# Patient Record
Sex: Male | Born: 1950 | Race: Black or African American | Hispanic: No | Smoking: Former smoker
Health system: Southern US, Community
[De-identification: ages and names within clinical notes are randomized; demographics above are authoritative.]

## PROBLEM LIST (undated history)

## (undated) DIAGNOSIS — R040 Epistaxis: Secondary | ICD-10-CM

## (undated) DIAGNOSIS — I82403 Acute embolism and thrombosis of unspecified deep veins of lower extremity, bilateral: Secondary | ICD-10-CM

## (undated) DIAGNOSIS — J9621 Acute and chronic respiratory failure with hypoxia: Secondary | ICD-10-CM

## (undated) DIAGNOSIS — I10 Essential (primary) hypertension: Secondary | ICD-10-CM

## (undated) DIAGNOSIS — I2699 Other pulmonary embolism without acute cor pulmonale: Secondary | ICD-10-CM

## (undated) DIAGNOSIS — U071 COVID-19: Secondary | ICD-10-CM

## (undated) DIAGNOSIS — S065XAA Traumatic subdural hemorrhage with loss of consciousness status unknown, initial encounter: Secondary | ICD-10-CM

## (undated) DIAGNOSIS — S065X9A Traumatic subdural hemorrhage with loss of consciousness of unspecified duration, initial encounter: Secondary | ICD-10-CM

## (undated) DIAGNOSIS — R760 Raised antibody titer: Secondary | ICD-10-CM

## (undated) DIAGNOSIS — D869 Sarcoidosis, unspecified: Secondary | ICD-10-CM

## (undated) HISTORY — DX: Traumatic subdural hemorrhage with loss of consciousness status unknown, initial encounter: S06.5XAA

## (undated) HISTORY — PX: HERNIA REPAIR: SHX51

## (undated) HISTORY — DX: Raised antibody titer: R76.0

## (undated) HISTORY — DX: Traumatic subdural hemorrhage with loss of consciousness of unspecified duration, initial encounter: S06.5X9A

## (undated) HISTORY — DX: Essential (primary) hypertension: I10

## (undated) HISTORY — PX: BRAIN SURGERY: SHX531

## (undated) HISTORY — DX: Acute embolism and thrombosis of unspecified deep veins of lower extremity, bilateral: I82.403

---
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
## 2011-04-24 DIAGNOSIS — K420 Umbilical hernia with obstruction, without gangrene: Secondary | ICD-10-CM

## 2011-04-24 DIAGNOSIS — I2721 Secondary pulmonary arterial hypertension: Secondary | ICD-10-CM

## 2011-04-24 HISTORY — DX: Secondary pulmonary arterial hypertension: I27.21

## 2011-04-24 HISTORY — DX: Umbilical hernia with obstruction, without gangrene: K42.0

## 2018-03-31 LAB — PULMONARY FUNCTION TEST

## 2018-05-24 DIAGNOSIS — Z95828 Presence of other vascular implants and grafts: Secondary | ICD-10-CM

## 2018-05-24 HISTORY — DX: Presence of other vascular implants and grafts: Z95.828

## 2019-09-07 ENCOUNTER — Ambulatory Visit: Payer: Self-pay | Admitting: Family Medicine

## 2019-09-09 ENCOUNTER — Encounter: Payer: Self-pay | Admitting: Internal Medicine

## 2019-09-09 ENCOUNTER — Ambulatory Visit (INDEPENDENT_AMBULATORY_CARE_PROVIDER_SITE_OTHER): Payer: Medicare Other | Admitting: Internal Medicine

## 2019-09-09 ENCOUNTER — Other Ambulatory Visit: Payer: Self-pay

## 2019-09-09 DIAGNOSIS — Z23 Encounter for immunization: Secondary | ICD-10-CM

## 2019-09-09 DIAGNOSIS — I2699 Other pulmonary embolism without acute cor pulmonale: Secondary | ICD-10-CM

## 2019-09-09 DIAGNOSIS — D869 Sarcoidosis, unspecified: Secondary | ICD-10-CM

## 2019-09-09 DIAGNOSIS — T45515A Adverse effect of anticoagulants, initial encounter: Secondary | ICD-10-CM

## 2019-09-09 DIAGNOSIS — Z7901 Long term (current) use of anticoagulants: Secondary | ICD-10-CM | POA: Diagnosis not present

## 2019-09-09 MED ORDER — PREDNISONE 20 MG PO TABS: 40.0000 mg | ORAL_TABLET | Freq: Every day | ORAL | 0 refills | Status: DC

## 2019-09-09 MED ORDER — FLUTICASONE FUROATE-VILANTEROL 200-25 MCG/INH IN AEPB: 1.0000 | INHALATION_SPRAY | Freq: Every day | RESPIRATORY_TRACT | 2 refills | Status: DC

## 2019-09-09 NOTE — Progress Notes (Signed)
CC: establish care, sarcoidosis  HPI:  JesusJesus Russell is a 68 y.o. male with PMH below.  Today we will address establish care, dyspnea  Please see A&P for status of the patient's chronic medical conditions  Past Medical History:  Diagnosis Date  . DVT of lower extremity, bilateral ([place])   . Hypertension   . Pulmonary embolism ([place])   . Sarcoidosis 1989  . Subdural hematoma (HCC)    Review of Systems:  Review of Systems  Constitutional: Negative for chills and fever.  HENT: Negative for nosebleeds.   Eyes: Negative for blurred vision.  Respiratory: Positive for shortness of breath and wheezing. Negative for cough.   Gastrointestinal: Negative for nausea and vomiting.  Genitourinary: Negative for flank pain.  Musculoskeletal: Negative for falls.  Skin: Negative for rash.  Neurological: Negative for seizures and weakness.  Endo/Heme/Allergies: Does not bruise/bleed easily.  Psychiatric/Behavioral: Negative for depression and suicidal ideas.     Physical Exam:  Vitals:   09/09/19 0849  BP: 134/83  Pulse: 98  SpO2: 93%  Weight: 223 lb 14.4 oz (101.6 kg)   Physical Exam Constitutional:      Appearance: He is not diaphoretic.  Eyes:     General: No scleral icterus.       Right eye: No discharge.        Left eye: No discharge.  Cardiovascular:     Rate and Rhythm: Normal rate and regular rhythm.     Heart sounds: Normal heart sounds. No murmur. No friction rub. No gallop.   Pulmonary:     Effort: No respiratory distress.     Breath sounds: Wheezing present. No rales.  Abdominal:     General: Bowel sounds are normal. There is no distension.     Palpations: Abdomen is soft. There is no mass.     Tenderness: There is no abdominal tenderness. There is no guarding.  Musculoskeletal:        General: No swelling or tenderness.  Skin:    General: Skin is warm and dry.  Neurological:     Mental Status: He is alert.  Psychiatric:        Mood and Affect: Mood normal.         Behavior: Behavior normal.     Social History   Socioeconomic History  . Marital status: Divorced    Spouse name: Not on file  . Number of children: Not on file  . Years of education: Not on file  . Highest education level: Not on file  Occupational History  . Occupation: retired Tour manager  . Financial resource strain: Not very hard  . Food insecurity    Worry: Not on file    Inability: Not on file  . Transportation needs    Medical: Not on file    Non-medical: Not on file  Tobacco Use  . Smoking status: Former Smoker    Packs/day: 1.50    Years: 23.00    Pack years: 34.50    Types: Cigarettes    Quit date: 1995    Years since quitting: 25.7  Substance and Sexual Activity  . Alcohol use: Never    Frequency: Never  . Drug use: Not Currently    Types: "Crack" cocaine    Comment: quit 2008  . Sexual activity: Not on file  Lifestyle  . Physical activity    Days per week: Not on file    Minutes per session: Not on file  . Stress: Not  on file  Relationships  . Social Herbalist on phone: Not on file    Gets together: Not on file    Attends religious service: Not on file    Active member of club or organization: Not on file    Attends meetings of clubs or organizations: Not on file    Relationship status: Not on file  . Intimate partner violence    Fear of current or ex partner: Not on file    Emotionally abused: Not on file    Physically abused: Not on file    Forced sexual activity: Not on file  Other Topics Concern  . Not on file  Social History Narrative  . Not on file    Family History  Adopted: Yes    Assessment & Plan:   See Encounters Tab for problem based charting.  Patient discussed with Dr. Lynnae January

## 2019-09-09 NOTE — Assessment & Plan Note (Signed)
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  We are waiting on records to get the exact number and timeline.  He gets his Coumadin checked about once per month is been on a stable dosage.  -We will arrange for him to have an appointment in our Coumadin clinic for monitoring and dosage adjustments if needed

## 2019-09-09 NOTE — Assessment & Plan Note (Signed)
Diagnosed in 1989, treatment was deferred initially.  He has been maintained on inhaled corticosteroid therapy as he does have some wheezing and shortness of breath occasionally.  Today he is having some wheezing and shortness of breath.  He is also been tested for asthma, COPD with many different pulmonary function tests last of which was about 6 months ago.  Although we do not have his records yet he says that so far they have all been inconclusive not suggestive of COPD or asthma  -We will work to get a waiver signed today so we can get his records -He is clearly having difficulty breathing today with some wheezing and I will write for a short steroid burst -I will also switch his inhaler therapy from ICS and albuterol as needed to ICS/LABA with Breo and he can continue to use his rescue inhaler as needed

## 2019-09-09 NOTE — Patient Instructions (Signed)
Mr. Amorin I have sent you a prescription in for a short course of oral steroids.  I have also started you on a new inhaler.  You can stop the flovent and use your new inhaler daily.  You can still use your rescue albuterol as needed.  Please schedule an appointment with our coumadin clinic to manage your warfarin.

## 2019-09-14 ENCOUNTER — Other Ambulatory Visit: Payer: Self-pay

## 2019-09-14 ENCOUNTER — Ambulatory Visit (INDEPENDENT_AMBULATORY_CARE_PROVIDER_SITE_OTHER): Payer: Medicare Other | Admitting: Pharmacist

## 2019-09-14 DIAGNOSIS — Z7901 Long term (current) use of anticoagulants: Secondary | ICD-10-CM

## 2019-09-14 DIAGNOSIS — I2699 Other pulmonary embolism without acute cor pulmonale: Secondary | ICD-10-CM

## 2019-09-14 DIAGNOSIS — Z86718 Personal history of other venous thrombosis and embolism: Secondary | ICD-10-CM | POA: Diagnosis not present

## 2019-09-14 DIAGNOSIS — Z86711 Personal history of pulmonary embolism: Secondary | ICD-10-CM

## 2019-09-14 DIAGNOSIS — Z5181 Encounter for therapeutic drug level monitoring: Secondary | ICD-10-CM

## 2019-09-14 LAB — POCT INR: INR: 2.3 (ref 2.0–3.0)

## 2019-09-14 NOTE — Progress Notes (Signed)
Anticoagulation Management Jesus Russell is a 68 y.o. male who reports to the clinic for monitoring of warfarin treatment.    Indication: PE, history of (resolved); DVT, history of multiple recurrences (resolved); Long term current use of anticoagulant.   Duration: indefinite Supervising physician: Velna Ochs, MD  Anticoagulation Clinic Visit History: Patient does not report signs/symptoms of bleeding or thromboembolism  Other recent changes: No diet, medications, lifestyle changes endorsed.  Anticoagulation Episode Summary    Current INR goal:  2.0-3.0  TTR:  -  Next INR check:  10/05/2019  INR from last check:  2.3 (09/14/2019)  Weekly max warfarin dose:    Target end date:  Indefinite  INR check location:    Preferred lab:    Send INR reminders to:     Indications   Long term (current) use of anticoagulants [Z79.01] History of DVT (deep vein thrombosis) [Z86.718] Pulmonary embolism on long-term anticoagulation therapy (HCC) [I26.99 Z79.01] History of pulmonary embolism [Z86.711]       Comments:          Not on File  Current Outpatient Medications:  .  cholecalciferol (VITAMIN D3) 25 MCG (1000 UT) tablet, Take 1,000 Units by mouth daily., Disp: , Rfl:  .  fluticasone furoate-vilanterol (BREO ELLIPTA) 200-25 MCG/INH AEPB, Inhale 1 puff into the lungs daily., Disp: 60 each, Rfl: 2 .  losartan (COZAAR) 100 MG tablet, Take 12.5 mg by mouth daily., Disp: , Rfl:  .  zinc gluconate 50 MG tablet, Take 50 mg by mouth daily., Disp: , Rfl:  .  predniSONE (DELTASONE) 20 MG tablet, Take 2 tablets (40 mg total) by mouth daily with breakfast. (Patient not taking: Reported on 09/14/2019), Disp: 4 tablet, Rfl: 0 Past Medical History:  Diagnosis Date  . DVT of lower extremity, bilateral ([place])   . Hypertension   . Pulmonary embolism ([place])   . Sarcoidosis 1989  . Subdural hematoma (H[facility])    Social History   Socioeconomic History  . Marital status: Divorced    Spouse name: Not  on file  . Number of children: Not on file  . Years of education: Not on file  . Highest education level: Not on file  Occupational History  . Occupation: retired Tour manager  . Financial resource strain: Not very hard  . Food insecurity    Worry: Not on file    Inability: Not on file  . Transportation needs    Medical: Not on file    Non-medical: Not on file  Tobacco Use  . Smoking status: Former Smoker    Packs/day: 1.50    Years: 23.00    Pack years: 34.50    Types: Cigarettes    Quit date: 1995    Years since quitting: 25.7  . Smokeless tobacco: Never Used  Substance and Sexual Activity  . Alcohol use: Never    Frequency: Never  . Drug use: Not Currently    Types: "Crack" cocaine    Comment: quit 2008  . Sexual activity: Not on file  Lifestyle  . Physical activity    Days per week: Not on file    Minutes per session: Not on file  . Stress: Not on file  Relationships  . Social Herbalist on phone: Not on file    Gets together: Not on file    Attends religious service: Not on file    Active member of club or organization: Not on file    Attends meetings of clubs  or organizations: Not on file    Relationship status: Not on file  Other Topics Concern  . Not on file  Social History Narrative  . Not on file   Family History  Adopted: Yes    ASSESSMENT Recent Results: The most recent result is correlated with 28 mg per week: Lab Results  Component Value Date   INR 2.3 09/14/2019    Anticoagulation Dosing: Description   Take one of your 82m-blue warfarin tablets, by mouth, once-daily [facility].      INR today: Therapeutic  PLAN Weekly dose was unchanged.   Patient Instructions  Patient instructed to take medications as defined in the Anti-coagulation Track section of this encounter.  Patient instructed to take today's dose.  Patient instructed to take  one of your 467mblue warfarin tablets, by mouth, once-daily [facility]  [facility].  Patient verbalized understanding of these instructions.     Patient advised to contact clinic or seek medical attention if signs/symptoms of bleeding or thromboembolism occur.  Patient verbalized understanding by repeating back information and was advised to contact me if further medication-related questions arise. Patient was also provided an information handout.  Follow-up Return in 22 days (on 10/06/2019) for Follow up INR.  JaPennie BanterPharmD, CPP  15 minutes spent face-to-face with the patient during the encounter. 50% of time spent on education, including signs/sx bleeding and clotting, as well as food and drug interactions with warfarin. 50% of time was spent on fingerprick POC INR sample collection,processing, results determination, and documentation in [email or web address]

## 2019-09-14 NOTE — Patient Instructions (Signed)
Patient instructed to take medications as defined in the Anti-coagulation Track section of this encounter.  Patient instructed to take today's dose.  Patient instructed to take one of your 4mg-blue warfarin tablets, by mouth, once-daily at 6PM each day.  Patient verbalized understanding of these instructions.   

## 2019-09-14 NOTE — Progress Notes (Signed)
History of intracranial hemorrhage December 2017   He takes one 4 mg tablet daily Target 2-3  Nose bleed 8

## 2019-09-14 NOTE — Progress Notes (Signed)
Internal Medicine Clinic Attending  Case discussed with Dr. Winfrey  at the time of the visit.  We reviewed the resident's history and exam and pertinent patient test results.  I agree with the assessment, diagnosis, and plan of care documented in the resident's note.  

## 2019-09-17 NOTE — Progress Notes (Signed)
I agree with pharmacy recommendations as outlined in their note.

## 2019-10-06 ENCOUNTER — Other Ambulatory Visit: Payer: Self-pay

## 2019-10-06 ENCOUNTER — Ambulatory Visit (HOSPITAL_COMMUNITY)
Admission: RE | Admit: 2019-10-06 | Discharge: 2019-10-06 | Disposition: A | Discharge status: Home / Self Care | Payer: Medicare Other | Source: Ambulatory Visit | Attending: Internal Medicine | Admitting: Internal Medicine

## 2019-10-06 ENCOUNTER — Ambulatory Visit (INDEPENDENT_AMBULATORY_CARE_PROVIDER_SITE_OTHER): Payer: Medicare Other | Admitting: Pharmacist

## 2019-10-06 ENCOUNTER — Ambulatory Visit (INDEPENDENT_AMBULATORY_CARE_PROVIDER_SITE_OTHER): Payer: Medicare Other | Admitting: Internal Medicine

## 2019-10-06 VITALS — BP 136/85 | HR 87 | Temp 98.0°F | Wt 224.9 lb

## 2019-10-06 DIAGNOSIS — Z87891 Personal history of nicotine dependence: Secondary | ICD-10-CM

## 2019-10-06 DIAGNOSIS — I10 Essential (primary) hypertension: Secondary | ICD-10-CM | POA: Insufficient documentation

## 2019-10-06 DIAGNOSIS — D860 Sarcoidosis of lung: Secondary | ICD-10-CM | POA: Insufficient documentation

## 2019-10-06 DIAGNOSIS — Z86711 Personal history of pulmonary embolism: Secondary | ICD-10-CM

## 2019-10-06 DIAGNOSIS — Z7901 Long term (current) use of anticoagulants: Secondary | ICD-10-CM | POA: Diagnosis not present

## 2019-10-06 DIAGNOSIS — Z5181 Encounter for therapeutic drug level monitoring: Secondary | ICD-10-CM | POA: Diagnosis not present

## 2019-10-06 DIAGNOSIS — Z79899 Other long term (current) drug therapy: Secondary | ICD-10-CM | POA: Diagnosis not present

## 2019-10-06 DIAGNOSIS — Z86718 Personal history of other venous thrombosis and embolism: Secondary | ICD-10-CM

## 2019-10-06 DIAGNOSIS — Z452 Encounter for adjustment and management of vascular access device: Secondary | ICD-10-CM | POA: Insufficient documentation

## 2019-10-06 DIAGNOSIS — Z7951 Long term (current) use of inhaled steroids: Secondary | ICD-10-CM

## 2019-10-06 DIAGNOSIS — Z1211 Encounter for screening for malignant neoplasm of colon: Secondary | ICD-10-CM | POA: Insufficient documentation

## 2019-10-06 DIAGNOSIS — I2699 Other pulmonary embolism without acute cor pulmonale: Secondary | ICD-10-CM

## 2019-10-06 DIAGNOSIS — D869 Sarcoidosis, unspecified: Secondary | ICD-10-CM

## 2019-10-06 LAB — POCT INR: INR: 2.3 (ref 2.0–3.0)

## 2019-10-06 MED ORDER — PREDNISONE 20 MG PO TABS: 40.0000 mg | ORAL_TABLET | Freq: Every day | ORAL | 0 refills | Status: DC

## 2019-10-06 NOTE — Progress Notes (Signed)
Anticoagulation Management Jesus Russell is a 68 y.o. male who reports to the clinic for monitoring of warfarin treatment.    Indication: History of DVT (resolved); History of PE (resolved); Long term current use of anticoagulant.    Duration: indefinite Supervising physician: Lalla Brothers  Anticoagulation Clinic Visit History: Patient does not report signs/symptoms of bleeding or thromboembolism  Other recent changes: No diet, medications, lifestyle changes endorsed by the patient at this visit.  Anticoagulation Episode Summary    Current INR goal:  2.0-3.0  TTR:  100.0 % (1.7 wk)  Next INR check:  11/02/2019  INR from last check:  2.3 (10/06/2019)  Weekly max warfarin dose:    Target end date:  Indefinite  INR check location:    Preferred lab:    Send INR reminders to:     Indications   Long term (current) use of anticoagulants [Z79.01] History of DVT (deep vein thrombosis) [Z86.718] Pulmonary embolism on long-term anticoagulation therapy (HCC) [I26.99 T45.515A] History of pulmonary embolism [Z86.711]       Comments:          Not on File  Current Outpatient Medications:  .  cholecalciferol (VITAMIN D3) 25 MCG (1000 UT) tablet, Take 1,000 Units by mouth daily., Disp: , Rfl:  .  fluticasone furoate-vilanterol (BREO ELLIPTA) 200-25 MCG/INH AEPB, Inhale 1 puff into the lungs daily., Disp: 60 each, Rfl: 2 .  losartan-hydrochlorothiazide (HYZAAR) 50-12.5 MG tablet, Take 1 tablet by mouth daily., Disp: , Rfl:  .  predniSONE (DELTASONE) 20 MG tablet, Take 2 tablets (40 mg total) by mouth daily with breakfast., Disp: 120 tablet, Rfl: 0 .  zinc gluconate 50 MG tablet, Take 50 mg by mouth daily., Disp: , Rfl:  Past Medical History:  Diagnosis Date  . DVT of lower extremity, bilateral ([place])   . Hypertension   . Pulmonary embolism ([place])   . Sarcoidosis 1989  . Subdural hematoma (H[facility])    Social History   Socioeconomic History  . Marital status: Divorced    Spouse name: Not  on file  . Number of children: Not on file  . Years of education: Not on file  . Highest education level: Not on file  Occupational History  . Occupation: retired Tour manager  . Financial resource strain: Not very hard  . Food insecurity    Worry: Not on file    Inability: Not on file  . Transportation needs    Medical: Not on file    Non-medical: Not on file  Tobacco Use  . Smoking status: Former Smoker    Packs/day: 1.50    Years: 23.00    Pack years: 34.50    Types: Cigarettes    Quit date: 1995    Years since quitting: 25.8  . Smokeless tobacco: Never Used  Substance and Sexual Activity  . Alcohol use: Never    Frequency: Never  . Drug use: Not Currently    Types: "Crack" cocaine    Comment: quit 2008  . Sexual activity: Not on file  Lifestyle  . Physical activity    Days per week: Not on file    Minutes per session: Not on file  . Stress: Not on file  Relationships  . Social Herbalist on phone: Not on file    Gets together: Not on file    Attends religious service: Not on file    Active member of club or organization: Not on file    Attends meetings of  clubs or organizations: Not on file    Relationship status: Not on file  Other Topics Concern  . Not on file  Social History Narrative  . Not on file   Family History  Adopted: Yes    ASSESSMENT Recent Results: The most recent result is correlated with 28 mg per week: Lab Results  Component Value Date   INR 2.3 10/06/2019   INR 2.3 09/14/2019    Anticoagulation Dosing: Description   Take one of your 30m-blue warfarin tablets, by mouth, once-daily [facility].      INR today: Therapeutic  PLAN Weekly dose was unchanged, he will continue taking 433mday (2854mk).  Patient Instructions  Patient instructed to take medications as defined in the Anti-coagulation Track section of this encounter.  Patient instructed to take today's dose.  Patient instructed to take one of  your 4mg67mue warfarin tablets, by mouth, once-daily at [facility].  Patient verbalized understanding of these instructions.    Patient advised to contact clinic or seek medical attention if signs/symptoms of bleeding or thromboembolism occur.  Patient verbalized understanding by repeating back information and was advised to contact me if further medication-related questions arise. Patient was also provided an information handout.  Follow-up Return in 27 days (on 11/02/2019) for Follow up INR.  JamePennie BanterarmD, CPP  15 minutes spent face-to-face with the patient during the encounter. 50% of time spent on education, including signs/sx bleeding and clotting, as well as food and drug interactions with warfarin. 50% of time was spent on fingerprick POC INR sample collection,processing, results determination, and documentation in [email or web address]

## 2019-10-06 NOTE — Assessment & Plan Note (Signed)
Blood pressure of 136/83 today, 134/83 at last appointment.  Patient reports taking losartan-hydrochlorothiazide 50-12.5 mg every other day as it makes him urinate too much if he takes it more frequently.  Informed patient that he would likely benefit from taking it every day especially in the context of initiating prednisone therapy and suggested that we could try alternative medications if he is not okay with the side effects.  Patient states he desires to continue with current medication will take it daily.

## 2019-10-06 NOTE — Progress Notes (Signed)
Internal Medicine Clinic Attending  I saw and evaluated the patient.  I personally confirmed the key portions of the history and exam documented by Dr. Benjamine Mola and I reviewed pertinent patient test results.  The assessment, diagnosis, and plan were formulated together and I agree with the documentation in the resident's note.  Lenice Pressman, M.D., Ph.D.

## 2019-10-06 NOTE — Patient Instructions (Signed)
At your visit today we discussed your symptoms from your sarcoidosis.  You previously had benefit from a short course of prednisone.  We are giving you a prolonged course of prednisone and have sent a referral for you to follow-up with pulmonology.  We also want to see you in our clinic in approximately 6 weeks.  We do not want to abruptly discontinue your prednisone but will need to taper it slowly.  At your follow-up appointment, we can discuss dosing and managing this taper.  We have also placed an order to get a chest x-ray to see if there is any other abnormalities.  Given your elevated blood pressure at clinic today, and the new prednisone medication which can increase your blood pressure, it is important to take your losartan-hydrochlorothiazide every day.  If you are urinating too frequently and this is bothering you we can discuss alternative medications.  We have also sent a referral to the gastroenterologist so that you can have a colonoscopy performed.

## 2019-10-06 NOTE — Patient Instructions (Signed)
Patient instructed to take medications as defined in the Anti-coagulation Track section of this encounter.  Patient instructed to take today's dose.  Patient instructed to take one of your 26m-blue warfarin tablets, by mouth, [facility]-daily [facility].  Patient verbalized understanding of these instructions.

## 2019-10-06 NOTE — Progress Notes (Signed)
   CC: Shortness of breath  HPI:  [name] is a 68 y.o. with past medical history as below who presents for follow-up on shortness of breath.  Please see problem based documentation below.  Past Medical History:  Diagnosis Date  . DVT of lower extremity, bilateral ([place])   . Hypertension   . Pulmonary embolism ([place])   . Sarcoidosis 1989  . Subdural hematoma (H[facility])    Review of Systems:   Review of Systems  Constitutional: Negative for chills and fever.  HENT: Negative for congestion.   Respiratory: Positive for cough and shortness of breath.   Cardiovascular: Negative for chest pain.  Gastrointestinal: Negative for abdominal pain, constipation, diarrhea, nausea and vomiting.  Genitourinary: Negative for dysuria, frequency and urgency.  All other systems reviewed and are negative.   Physical Exam: Physical Exam  Constitutional: He is well-developed, well-nourished, and in no distress.  Pauses frequently during sentences due to shortness of breath  HENT:  Head: Normocephalic and atraumatic.  Eyes: EOM are normal. Right eye exhibits no discharge. Left eye exhibits no discharge.  Neck: Normal range of motion. No tracheal deviation present.  Cardiovascular: Normal rate and regular rhythm. Exam reveals no gallop and no friction rub.  No murmur heard. Pulmonary/Chest: Effort normal. No respiratory distress. He has wheezes. He has no rales.  Abdominal: Soft. He exhibits no distension. There is no abdominal tenderness. There is no rebound and no guarding.  Musculoskeletal: Normal range of motion.        General: No tenderness, deformity or edema.  Neurological: He is alert. Coordination normal.  Skin: Skin is warm and dry. No rash noted. He is not diaphoretic. No erythema.  Psychiatric: Memory and judgment normal.    Vitals:   10/06/19 1430  BP: 136/85  Pulse: 87  Temp: 98 F (36.7 C)  TempSrc: Oral  SpO2: 92%  Weight: 224 lb 14.4 oz (102 kg)    Assessment & Plan:    See Encounters Tab for problem based charting.  Patient seen and discussed with Dr. Rebeca Alert

## 2019-10-06 NOTE — Assessment & Plan Note (Signed)
On long-term warfarin therapy for PE/DVT history.  Appointment today with Dr. Johney Maine

## 2019-10-06 NOTE — Assessment & Plan Note (Addendum)
Patient reports that he was diagnosed with sarcoidosis in 1990.  Patient had a routine preoperative chest x-ray which detected abnormalities and was followed by bronchoscopy to confirm diagnosis of sarcoidosis.  Patient was later trialed on prednisone which he took for approximately 1 year and discontinued because of weight gain.  Patient was given 2 days of prednisone at last clinic visit and had temporary but significant improvement and is inquiring about additional prednisone therapy.  Patient also reports that the Breo-Ellipta improved his symptoms.  He states he has not taken the albuterol inhaler as it does not help.  We are still awaiting records from prior providers.  Given history, significant wheezing on exam, and reported improvement with prednisone trial, will initiate longer-term prednisone therapy.  We will plan for 6-week duration of therapy and at follow-up appointment will assess response and manage taper.  We will also send referral for pulmonology.  We will continue with Breo-Ellipta therapy as well. Ordering CXR to evaluate for additional process.

## 2019-10-06 NOTE — Assessment & Plan Note (Signed)
Patient states that he is never had a colonoscopy, but is open to getting one.  Referral sent to gastroenterology for colonoscopy.

## 2019-10-07 ENCOUNTER — Encounter: Payer: Self-pay | Admitting: Gastroenterology

## 2019-10-07 NOTE — Progress Notes (Signed)
INTERNAL MEDICINE TEACHING ATTENDING ADDENDUM ° °I agree with pharmacy recommendations as outlined in their note.  ° °-Meggen Spaziani MD ° °

## 2019-10-27 ENCOUNTER — Encounter: Payer: Self-pay | Admitting: Internal Medicine

## 2019-10-29 ENCOUNTER — Encounter: Payer: Self-pay | Admitting: Internal Medicine

## 2019-10-29 ENCOUNTER — Other Ambulatory Visit: Payer: Self-pay | Admitting: Internal Medicine

## 2019-10-29 DIAGNOSIS — Z86718 Personal history of other venous thrombosis and embolism: Secondary | ICD-10-CM

## 2019-10-29 DIAGNOSIS — Z7901 Long term (current) use of anticoagulants: Secondary | ICD-10-CM

## 2019-10-29 DIAGNOSIS — Z23 Encounter for immunization: Secondary | ICD-10-CM | POA: Insufficient documentation

## 2019-10-29 DIAGNOSIS — K760 Fatty (change of) liver, not elsewhere classified: Secondary | ICD-10-CM | POA: Insufficient documentation

## 2019-10-29 DIAGNOSIS — I2721 Secondary pulmonary arterial hypertension: Secondary | ICD-10-CM | POA: Insufficient documentation

## 2019-10-29 DIAGNOSIS — G4733 Obstructive sleep apnea (adult) (pediatric): Secondary | ICD-10-CM

## 2019-10-29 DIAGNOSIS — D869 Sarcoidosis, unspecified: Secondary | ICD-10-CM

## 2019-10-29 DIAGNOSIS — K8020 Calculus of gallbladder without cholecystitis without obstruction: Secondary | ICD-10-CM | POA: Insufficient documentation

## 2019-10-29 NOTE — Assessment & Plan Note (Signed)
Obstructive sleep apnea noted at [facility] on 07/03/2018. Note states patient is non-compliant with CPAP

## 2019-10-29 NOTE — Assessment & Plan Note (Addendum)
From [facility]: US Duplex Venous lower extremity right (04/02/2017): Demonstrates extensive right lower extremity deep vein thrombosis

## 2019-10-29 NOTE — Assessment & Plan Note (Addendum)
Records received from [facility]:  Complete 2D M-mode Doppler echocardiogram: Conclusions: *The left ventricle size is normal *There is mild concentric left ventricular hypertrophy *There is normal global left ventricular contractility *The diastolic filling pattern indicates impaired relaxation *There is mild aortic valve sclerosis *Mild mitral regurgitation is present *Partial torn cord of anterior leaflet no prolapse of significant MR no masses * EF (Teich): 58%

## 2019-10-29 NOTE — Assessment & Plan Note (Addendum)
Records from [facility]: CT Chest Without IV Contrast (05/27/2018): Impression: 1. Precarinal and hilar adenopathy, consistent with the patient's clinical diagnosis of sarcoidosis. 2.  Severe bilateral parenchymal changes including areas of bronchiectasis and fibrosis, consistent with parenchymal involvement of sarcoidosis. 3.  Enlarged pulmonary artery branches, suggesting pulmonary artery hypertension. 4.  Cardiomegaly. 5.  Coronary artery atherosclerosis. 6.  No CT evidence of suspicious pulmonary nodule, mass or acute lobar consolidation. 7.  Porcelain gallbladder. 8.  Vena cava filter.  PFTs (04/26/2016): Findings (summarized): * Moderately reduced FVC 2.15 L, 47% of predicted.  After ministration of albuterol, the FVC is 1.97 L, 43% predicted * Reduced FEV1/FVC of 66%. * Reduced FEV1 1.43 L, 45% predicted. Impression: There is moderate combined obstructive/restrictive ventilatory defect.  There is no clear improvement with administration of a bronchodilator.  There is relative residual hyperinflation.  The reduced ERV is likely due to body habitus.  The diffusing capacity is mildly reduced, but normalizes after correcting for alveolar volume.  PFTs (04/02/2018): Findings (summarized): *FVC is moderately reduced, 2.01 L, 49% predicted *FEV1 moderately reduced, 1.41 L, 49% predicted *FEV1/FVC is normal, 70% *After bronchodilator the FVC is 58% predicted, 19% increase, FEV1 is 51% predicted, FEV1/FVC mildly reduced, 61%.  Interpretation: There is a moderate restrictive ventilatory defect prior to bronchodilator.  There is significant improvement with administration of bronchodilator for FVC but after administration of a bronchodilator there is an obstructive ventilatory defect.  Lung volumes show moderate restrictive ventilatory defect.  When compared to prior study, there is no significant trend.

## 2019-10-29 NOTE — Assessment & Plan Note (Addendum)
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  He will require lifelong warfarin therapy.

## 2019-10-29 NOTE — Progress Notes (Unsigned)
Records received and reviewed from [facility], [facility]. Additions made to problem list and medical history with PowerScribe. Relevant records will be scanned into medical record.

## 2019-10-29 NOTE — Assessment & Plan Note (Signed)
Noted on abdominal ultrasound from February 2017 from [facility]

## 2019-10-29 NOTE — Assessment & Plan Note (Signed)
Note from [facility] indicates he received Prevnar-13 on 06/23/2019

## 2019-11-02 ENCOUNTER — Ambulatory Visit (INDEPENDENT_AMBULATORY_CARE_PROVIDER_SITE_OTHER): Payer: Medicare Other | Admitting: Pharmacist

## 2019-11-02 DIAGNOSIS — Z5181 Encounter for therapeutic drug level monitoring: Secondary | ICD-10-CM

## 2019-11-02 DIAGNOSIS — Z86718 Personal history of other venous thrombosis and embolism: Secondary | ICD-10-CM

## 2019-11-02 DIAGNOSIS — Z7901 Long term (current) use of anticoagulants: Secondary | ICD-10-CM | POA: Diagnosis present

## 2019-11-02 DIAGNOSIS — I2699 Other pulmonary embolism without acute cor pulmonale: Secondary | ICD-10-CM

## 2019-11-02 DIAGNOSIS — Z86711 Personal history of pulmonary embolism: Secondary | ICD-10-CM | POA: Diagnosis not present

## 2019-11-02 LAB — POCT INR: INR: 3 (ref 2.0–3.0)

## 2019-11-02 MED ORDER — WARFARIN SODIUM 4 MG PO TABS: ORAL_TABLET | ORAL | 2 refills | Status: DC

## 2019-11-02 NOTE — Addendum Note (Signed)
Addended by: Jorene Guest B on: 11/02/2019 02:36 PM   Modules accepted: Orders

## 2019-11-02 NOTE — Patient Instructions (Signed)
Patient instructed to take medications as defined in the Anti-coagulation Track section of this encounter.  Patient instructed to take today's dose.  Patient instructed to take one of your 55m-blue warfarin tablets, by mouth, once-daily at [facility] day--EXCEPT on Mondays, Wednesdays, and Fridays--take ONLY 1/2 tablet.  Patient verbalized understanding of these instructions.

## 2019-11-02 NOTE — Progress Notes (Signed)
Anticoagulation Management Jesus Russell is a 68 y.o. male who reports to the clinic for monitoring of warfarin treatment.    Indication: PE C, History of; Long term current use of anticoagulant.   Duration: indefinite Supervising physician: Velna Ochs, MD  Anticoagulation Clinic Visit History: Patient does not report signs/symptoms of bleeding or thromboembolism  Other recent changes: No diet, medications, lifestyle changes.  Anticoagulation Episode Summary    Current INR goal:  2.0-3.0  TTR:  100.0 % (1.3 mo)  Next INR check:  11/23/2019  INR from last check:  3.0 (11/02/2019)  Weekly max warfarin dose:    Target end date:  Indefinite  INR check location:    Preferred lab:    Send INR reminders to:     Indications   Long term (current) use of anticoagulants [Z79.01] History of DVT (deep vein thrombosis) [Z86.718] Pulmonary embolism on long-term anticoagulation therapy (HCC) [I26.99 T45.515A] History of pulmonary embolism [Z86.711]       Comments:          Not on File  Current Outpatient Medications:  .  cholecalciferol (VITAMIN D3) 25 MCG (1000 UT) tablet, Take 1,000 Units by mouth daily., Disp: , Rfl:  .  fluticasone furoate-vilanterol (BREO ELLIPTA) 200-25 MCG/INH AEPB, Inhale 1 puff into the lungs daily., Disp: 60 each, Rfl: 2 .  losartan-hydrochlorothiazide (HYZAAR) 50-12.5 MG tablet, Take 1 tablet by mouth daily., Disp: , Rfl:  .  predniSONE (DELTASONE) 20 MG tablet, Take 2 tablets (40 mg total) by mouth daily with breakfast., Disp: 120 tablet, Rfl: 0 .  warfarin (COUMADIN) 4 MG tablet, Take 4 mg by mouth daily., Disp: , Rfl:  .  zinc gluconate 50 MG tablet, Take 50 mg by mouth daily., Disp: , Rfl:  Past Medical History:  Diagnosis Date  . DVT of lower extremity, bilateral ([place])   . Hypertension   . Incarcerated umbilical hernia 17/5102   Status post repair  . Lupus anticoagulant positive    Per records from [facility]  . 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 Pulmonary artery hypertension ([place]) 04/2011   Mildly elevated pulmonary artery pressure in setting of PE (from [facility])  . Pulmonary embolism ([place]) 04/2011  . Sarcoidosis 1989  . Subdural hematoma (HCC)    Remote episode, occurred when taking excedrin and warfarin.    Social History   Socioeconomic History  . Marital status: Divorced    Spouse name: Not on file  . Number of children: Not on file  . Years of education: Not on file  . Highest education level: Not on file  Occupational History  . Occupation: retired Tour manager  . Financial resource strain: Not very hard  . Food insecurity    Worry: Not on file    Inability: Not on file  . Transportation needs    Medical: Not on file    Non-medical: Not on file  Tobacco Use  . Smoking status: Former Smoker    Packs/day: 1.50    Years: 23.00    Pack years: 34.50    Types: Cigarettes    Quit date: 1995    Years since quitting: 25.8  . Smokeless tobacco: Never Used  Substance and Sexual Activity  . Alcohol use: Never    Frequency: Never  . Drug use: Not Currently    Types: "Crack" cocaine    Comment: quit 2008  . Sexual activity:  Not on file  Lifestyle  . Physical activity    Days per week: Not on file    Minutes per session: Not on file  . Stress: Not on file  Relationships  . Social Herbalist on phone: Not on file    Gets together: Not on file    Attends religious service: Not on file    Active member of club or organization: Not on file    Attends meetings of clubs or organizations: Not on file    Relationship status: Not on file  Other Topics Concern  . Not on file  Social History Narrative  . Not on file   Family History  Adopted: Yes    ASSESSMENT Recent Results: The most recent result is correlated with 28 mg per week: Lab Results  Component Value Date   INR 3.0 11/02/2019   INR 2.3 10/06/2019    INR 2.3 09/14/2019    Anticoagulation Dosing: Description   Take one of your 1m-blue warfarin tablets, by mouth, once-daily [facility] day--EXCEPT on Mondays, Wednesdays, and Fridays--take ONLY 1/2 tablet.      INR today: Therapeutic  PLAN Weekly dose was decreased by 20% to 22 mg per week  Patient Instructions  Patient instructed to take medications as defined in the Anti-coagulation Track section of this encounter.  Patient instructed to take today's dose.  Patient instructed to take one of your 419mblue warfarin tablets, by mouth, [facility]--EXCEPT on Mondays, Wednesdays, and Fridays--take ONLY 1/2 tablet.  Patient verbalized understanding of these instructions.    Patient advised to contact clinic or seek medical attention if signs/symptoms of bleeding or thromboembolism occur.  Patient verbalized understanding by repeating back information and was advised to contact me if further medication-related questions arise. Patient was also provided an information handout.  Follow-up Return in 3 weeks (on 11/23/2019) for Follow up INR.  JaPennie BanterPharmD, CPP  15 minutes spent face-to-face with the patient during the encounter. 50% of time spent on education, including signs/sx bleeding and clotting, as well as food and drug interactions with warfarin. 50% of time was spent on fingerprick POC INR sample collection,processing, results determination, and documentation in [email or web address]

## 2019-11-03 NOTE — Progress Notes (Signed)
INTERNAL MEDICINE TEACHING ATTENDING ADDENDUM   I agree with pharmacy recommendations as outlined in their note.   Velna Ochs, MD

## 2019-11-05 ENCOUNTER — Other Ambulatory Visit: Payer: Self-pay

## 2019-11-05 ENCOUNTER — Telehealth: Payer: Self-pay | Admitting: Emergency Medicine

## 2019-11-05 ENCOUNTER — Ambulatory Visit (INDEPENDENT_AMBULATORY_CARE_PROVIDER_SITE_OTHER): Payer: Medicare Other | Admitting: Gastroenterology

## 2019-11-05 ENCOUNTER — Encounter: Payer: Self-pay | Admitting: Gastroenterology

## 2019-11-05 VITALS — BP 120/82 | HR 69 | Temp 97.6°F | Ht 69.0 in | Wt 235.1 lb

## 2019-11-05 DIAGNOSIS — Z7901 Long term (current) use of anticoagulants: Secondary | ICD-10-CM | POA: Diagnosis not present

## 2019-11-05 DIAGNOSIS — Z1211 Encounter for screening for malignant neoplasm of colon: Secondary | ICD-10-CM | POA: Diagnosis not present

## 2019-11-05 NOTE — Patient Instructions (Signed)
It has been recommended to you by your physician that you have a(n) colonoscopy completed. Per your request, we did not schedule the procedure(s) today. Please contact our office at [phone] next month to schedule in January 2021. You will be scheduled for a pre-visit and procedure at that time.

## 2019-11-05 NOTE — Progress Notes (Signed)
Referring Provider: Jeanmarie Hubert, MD Primary Care Physician:  Jeanmarie Hubert, MD  Reason for Consultation:  Screening colonoscopy   IMPRESSION:  No prior colon cancer screening  History of pulmonary embolism on warfarin No known family history of colon cancer or polyps   Colonoscopy recommended for colon cancer screening. EGD and colonoscopy recommended. Will need to hold warfarin before endoscopy.  I discussed with the patient that there is a low, but real, risk of a cardiovascular event such as heart attack, stroke, or embolism/thrombosis while off Plavix. Will communicate by phone or EMR with patient's prescribing provider to confirm that holding the warfarin is appropriate at this time.   PLAN: Colonoscopy after a warfarin washout  Will proceed with endoscopy. The nature of the procedure, as well as the risks, benefits, and alternatives were carefully and thoroughly reviewed with the patient. Ample time for discussion and questions allowed. The patient understood, was satisfied, and agreed to proceed.  Please see the "Patient Instructions" section for addition details about the plan.  HPI: Jesus Russell is a 68 y.o. male retired from years working at [employer] in [place], [place], and now [place].  He is referred by Dr. Algernon Huxley for colon cancer screening.  The history is obtained through the patient and review of his electronic health record. Drug free for 13 years. On warfarin for a history of PE/DVT.  He has successfully stopped warfarin for other procedures in the past.  He also has a history of obstructive sleep apnea requiring CPAP, sarcoidosis, and hypertension.  No prior endoscopy or colon cancer screening.   There is no dysphagia, odynophagia, regurgitation,  heartburn, nausea, abdominal pain, change in bowel habits, melena, hematochezia, or bright red blood per rectum. There is no anorexia or recent change in weight.   No known family history of  colon cancer or polyps. No family history of uterine/endometrial cancer, pancreatic cancer or gastric/stomach cancer.   Past Medical History:  Diagnosis Date  . DVT of lower extremity, bilateral ([place])   . Hypertension   . Incarcerated umbilical hernia 07/1447   Status post repair  . Lupus anticoagulant positive    Per records from [facility]  . 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 Pulmonary artery hypertension ([place]) 04/2011   Mildly elevated pulmonary artery pressure in setting of PE (from [facility])  . Pulmonary embolism ([place]) 04/2011  . Sarcoidosis 1989  . Subdural hematoma (HCC)    Remote episode, occurred when taking excedrin and warfarin.     Past Surgical History:  Procedure Laterality Date  . BRAIN SURGERY    . HERNIA REPAIR      Current Outpatient Medications  Medication Sig Dispense Refill  . cholecalciferol (VITAMIN D3) 25 MCG (1000 UT) tablet Take 1,000 Units by mouth daily.    . fluticasone furoate-vilanterol (BREO ELLIPTA) 200-25 MCG/INH AEPB Inhale 1 puff into the lungs daily. 60 each 2  . losartan-hydrochlorothiazide (HYZAAR) 50-12.5 MG tablet Take 1 tablet by mouth daily.    . predniSONE (DELTASONE) 20 MG tablet Take 2 tablets (40 mg total) by mouth daily with breakfast. 120 tablet 0  . warfarin (COUMADIN) 4 MG tablet Take one-half (1/2) of your 29m warfarin tablet on Mondays, Wednesdays and Fridays. All other days, take one (1) tablet. 24 tablet 2  . zinc gluconate 50 MG tablet Take 50 mg by mouth daily.     No current  facility-administered medications for this visit.     Allergies as of 11/05/2019  . (No Known Allergies)    Family History  Adopted: Yes    Social History   Socioeconomic History  . Marital status: Divorced    Spouse name: Not on file  . Number of children: Not on file  . Years of education: Not on file  . Highest education level: Not on file  Occupational  History  . Occupation: retired Tour manager  . Financial resource strain: Not very hard  . Food insecurity    Worry: Not on file    Inability: Not on file  . Transportation needs    Medical: Not on file    Non-medical: Not on file  Tobacco Use  . Smoking status: Former Smoker    Packs/day: 1.50    Years: 23.00    Pack years: 34.50    Types: Cigarettes    Quit date: 1995    Years since quitting: 25.8  . Smokeless tobacco: Never Used  Substance and Sexual Activity  . Alcohol use: Never    Frequency: Never  . Drug use: Not Currently    Types: "Crack" cocaine    Comment: quit 2008  . Sexual activity: Not on file  Lifestyle  . Physical activity    Days per week: Not on file    Minutes per session: Not on file  . Stress: Not on file  Relationships  . Social Herbalist on phone: Not on file    Gets together: Not on file    Attends religious service: Not on file    Active member of club or organization: Not on file    Attends meetings of clubs or organizations: Not on file    Relationship status: Not on file  . Intimate partner violence    Fear of current or ex partner: Not on file    Emotionally abused: Not on file    Physically abused: Not on file    Forced sexual activity: Not on file  Other Topics Concern  . Not on file  Social History Narrative  . Not on file    Review of Systems: 12 system ROS is negative except as noted above.   Physical Exam: General:   Alert,  well-nourished, pleasant and cooperative in NAD Head:  Normocephalic and atraumatic. Eyes:  Sclera clear, no icterus.   Conjunctiva pink. Ears:  Normal auditory acuity. Nose:  No deformity, discharge,  or lesions. Mouth:  No deformity or lesions.   Neck:  Supple; no masses or thyromegaly. Lungs:  Clear throughout to auscultation.   No wheezes. Heart:  Regular rate and rhythm; no murmurs. Abdomen:  Soft, central obesity, nontender, nondistended, normal bowel sounds, no rebound or  guarding. No hepatosplenomegaly.   Rectal:  Deferred  Msk:  Symmetrical. No boney deformities LAD: No inguinal or umbilical LAD Extremities:  No clubbing or edema. Neurologic:  Alert and  oriented x4;  grossly nonfocal Skin:  Intact without significant lesions or rashes. Psych:  Alert and cooperative. Normal mood and affect.    Antonya Leeder L. Tarri Glenn, MD, MPH 11/05/2019, 2:24 PM

## 2019-11-05 NOTE — Telephone Encounter (Signed)
   Jesus Russell 10-06-1951 [id number]  Dear Dr. Benjamine Mola:  We would like to schedule the above named patient for a(n) colonscopy procedure. Our records show that he is on anticoagulation therapy.  Please advise as to whether the patient may come off their therapy of Warfarin 5 days prior to their procedure.  Please route your response to Tinnie Gens, CMA or fax response to [phone].  Sincerely,    [facility]

## 2019-11-09 ENCOUNTER — Telehealth: Payer: Self-pay | Admitting: Pharmacist

## 2019-11-09 ENCOUNTER — Ambulatory Visit (INDEPENDENT_AMBULATORY_CARE_PROVIDER_SITE_OTHER): Payer: Medicare Other | Admitting: Internal Medicine

## 2019-11-09 ENCOUNTER — Encounter: Payer: Self-pay | Admitting: Internal Medicine

## 2019-11-09 ENCOUNTER — Other Ambulatory Visit: Payer: Self-pay

## 2019-11-09 VITALS — BP 134/72 | HR 83 | Temp 98.1°F | Ht 68.0 in | Wt 233.0 lb

## 2019-11-09 DIAGNOSIS — D860 Sarcoidosis of lung: Secondary | ICD-10-CM

## 2019-11-09 LAB — COMPREHENSIVE METABOLIC PANEL
ALT: 26 U/L (ref 0–53)
AST: 14 U/L (ref 0–37)
Albumin: 3.8 g/dL (ref 3.5–5.2)
Alkaline Phosphatase: 54 U/L (ref 39–117)
BUN: 15 mg/dL (ref 6–23)
CO2: 35 mEq/L — ABNORMAL HIGH (ref 19–32)
Calcium: 9.1 mg/dL (ref 8.4–10.5)
Chloride: 100 mEq/L (ref 96–112)
Creatinine, Ser: 1.05 mg/dL (ref 0.40–1.50)
GFR: 85.42 mL/min (ref >60.00)
Glucose, Bld: 92 mg/dL (ref 70–99)
Potassium: 3.5 mEq/L (ref 3.5–5.1)
Sodium: 142 mEq/L (ref 135–145)
Total Bilirubin: 0.5 mg/dL (ref 0.2–1.2)
Total Protein: 6.8 g/dL (ref 6.0–8.3)

## 2019-11-09 LAB — CBC
HCT: 47.6 % (ref 39.0–52.0)
Hemoglobin: 15.5 g/dL (ref 13.0–17.0)
MCHC: 32.5 g/dL (ref 30.0–36.0)
MCV: 96.8 fl (ref 78.0–100.0)
Platelets: 239 10*3/uL (ref 150.0–400.0)
RBC: 4.92 Mil/uL (ref 4.22–5.81)
RDW: 16.6 % — ABNORMAL HIGH (ref 11.5–15.5)
WBC: 12.8 10*3/uL — ABNORMAL HIGH (ref 4.0–10.5)

## 2019-11-09 LAB — CALCIUM: Calcium: 9.1 mg/dL (ref 8.4–10.5)

## 2019-11-09 MED ORDER — SULFAMETHOXAZOLE-TRIMETHOPRIM 400-80 MG PO TABS: 1.0000 | ORAL_TABLET | ORAL | 3 refills | Status: DC

## 2019-11-09 MED ORDER — OMEPRAZOLE 40 MG PO CPDR: 40.0000 mg | DELAYED_RELEASE_CAPSULE | Freq: Every day | ORAL | 3 refills | Status: DC

## 2019-11-09 NOTE — Telephone Encounter (Signed)
See above.

## 2019-11-09 NOTE — Telephone Encounter (Signed)
The patient MAY have his warfarin discontinued 5 days prior to his planned procedure day/date. Upon discussion with the Attending Physician assigned to the [facility] today (Dr. [name]) and Dr. Benjamine Mola, we believe under an abundance of caution--given two unprovoked venous thromboembolic events, that we SHOULD "bridge" the patient with LMWH, commencing 36 hours AFTER COMMENCING WARFARIN "HOLD" and continuing the LMWH up to within 24 hours PRIOR to colonoscopy. PLEASE ADVISE when you PROPOSE the planned COLONOSCOPY--and based upon when you post your case, I will create a calendar documenting all of these days/date/times and provide a prescription for LMWH.

## 2019-11-09 NOTE — Telephone Encounter (Signed)
Patient being commenced upon [facility]-TMP by [facility] for PCP Px (3 times weekly). Called patient and advised we must do an empiric warfarin dose REDUCTION from 74m warfarin/wk to 113mwarfarin/wk. [facility]. The following physicians were apprised:  Dr. DeShearon Stallswho cc'd me their note from today's visit); Dr. BuLynnae Januarynd Dr. MaBenjamine Mola

## 2019-11-09 NOTE — Progress Notes (Signed)
Jesus Russell    [id number]    06-Sep-1951  Primary Care Physician:MacLean, Rodman Key, MD  Referring Physician: Jeanmarie Hubert, MD [address],  [place] [address] Reason for Consultation: pulmonary sarcoidosis Date of Consultation: 11/09/2019  Chief complaint:   Chief Complaint  Patient presents with  . Consult    Referred by pcp for sarcoidosis.       HPI: Diagnosed with Sarcoidosis in the 1990 range when he was going for a hernia surgery.  Was seen by a pulmonologist and had bronchoscopy with biopsies. He was initially asymptomatic - as he has gotten older his breathing has progressed.  About 10 years ago he started seeing Dr. Konrad Saha - his lung doctor in [place].   He reports shortness of breath with minimal exertion but is able to keep up with ADLs as long as he moves at his own pace. Gets sob climbing any stairs. No orthopnea, PND. No chest pain/palpitations. No syncope.   He is additionally on warfarin for a history of DVT/PE and is lupus Ag positive in 2015.   He notes his breathing feels a lot better when he is losing weight and he has regained some weight recently.   He saw Dr. Marcellina Millin at [facility] and was told he needed at gallium scan to determine the activity of his sarcoid.   He does have a history of childhood respiratory diseases - at least 2-3 URIs a year.   He denies coughing and wheezing - he is currently on 40 mg prednisone and he takes [employer].  He has also been on fluticasone MDI which made him cough.  He prefers the DPI. The 40 mg was started by his new PCP on 10/06/2019.   He was seeing an ophthalmologist in [place] - last seen 3 months ago.  He was receiving infusions of some medication from an oncologist in [place] - this was 3-4 years ago.  He thinks it was some kind of autoimmune disorder.    Social history: Pets: lab/pit mix dog Lives on the second floor of a group home of [place] as a retired resident - he is sober for 13  years.  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  Smoking history: former smoker - 30 pack years, quit 1990. History of cocaine - smoked and snorted, quit 2008.  Lifelong resident of: lived in [place] the past ten years, originally from [place] and had pet pigeons, but recently move to retire to [place].   Relevant family history: Unknown mother's side as patient is adopted.  On his father's side - brother with blood clots, grandmother blood clots and TB.   Family History  Adopted: Yes    Past Medical History:  Diagnosis Date  . DVT of lower extremity, bilateral ([place])   . Hypertension   . Incarcerated umbilical hernia [id number]/[id number]   Status post repair  . Lupus anticoagulant positive    Per records from [facility]  . 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 Pulmonary artery hypertension ([place]) 04/2011   Mildly elevated pulmonary artery pressure in setting of PE (from [facility])  . Pulmonary embolism ([place]) 04/2011  . Sarcoidosis 1989  . Subdural hematoma (HCC)    Remote episode, occurred when taking excedrin and warfarin.     Past Surgical History:  Procedure Laterality Date  . BRAIN SURGERY    .  HERNIA REPAIR      Review of systems: Constitutional: Negative for fever and chills, sweats, and weight loss HENT: Negative.   Eyes: Negative for blurred vision or eye pain.  Respiratory: As of breath, no coughing or wheezing Cardiovascular: Negative for chest pain and palpitations.  Gastrointestinal: Negative for vomiting, diarrhea, blood per rectum. Genitourinary: Negative for dysuria, urgency, frequency and hematuria.  Musculoskeletal: Negative for myalgias, back pain and joint pain.  Skin: Negative for itching and rash.  Neurological: Negative for dizziness, tremors, focal weakness, seizures and loss of consciousness.  Endo/Heme/Allergies: Negative for environmental allergies.   Psychiatric/Behavioral: Negative for depression, suicidal ideas and hallucinations.  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  Physical Exam: Blood pressure 134/72, pulse 83, temperature 98.1 F (36.7 C), temperature source Temporal, height _0  (1.727 m), weight 233 lb (105.7 kg), SpO2 94 %. Gen:      No acute distress Eyes: EOMI, sclera anicteric ENT:  no nasal polyps, mucus membranes moist, Mallampati 4 Neck:     Supple, no thyromegaly Lungs:    Shallow inspiration, symmetric chest wall excursion, no wheezes or crackles, there are rhonchi CV:         Regular rate and rhythm; no murmurs, rubs, or gallops.  No pedal edema Abd:      + bowel sounds; distended, soft, nontender MSK: no acute synovitis of DIP or PIP joints, no mechanics hands.   Skin:      Warm and dry; no rashes No lupus pernio Neuro: normal speech, no focal facial asymmetry Psych: alert and oriented x3, normal mood and affect  Data Reviewed: Imaging: I have personally reviewed the chest xray from October 06, 2019 which demonstrates bilateral reticular opacities with pleural parenchymal scarring there is a masslike opacity in the right upper lobe with volume loss.  This is all consistent with his previously described CT chest for sarcoidosis  Outside hospital records reviewed: Records from [facility]: CT Chest Without IV Contrast (05/27/2018): Impression: 1. Precarinal and hilar adenopathy, consistent with the patient's clinical diagnosis of sarcoidosis. 2.  Severe bilateral parenchymal changes including areas of bronchiectasis and fibrosis, consistent with parenchymal involvement of sarcoidosis. 3.  Enlarged pulmonary artery branches, suggesting pulmonary artery hypertension. 4.  Cardiomegaly. 5.  Coronary artery atherosclerosis. 6.  No CT evidence of suspicious pulmonary nodule, mass or acute lobar consolidation. 7.  Porcelain gallbladder. 8.  Vena cava filter.  PFTs (04/26/2016):  Findings (summarized): * Moderately reduced FVC 2.15 L, 47% of predicted.  After ministration of albuterol, the FVC is 1.97 L, 43% predicted * Reduced FEV1/FVC of 66%. * Reduced FEV1 1.43 L, 45% predicted. Impression: There is moderate combined obstructive/restrictive ventilatory defect.  There is no clear improvement with administration of a bronchodilator.  There is relative residual hyperinflation.  The reduced ERV is likely due to body habitus.  The diffusing capacity is mildly reduced, but normalizes after correcting for alveolar volume.  PFTs (04/02/2018): Findings (summarized): *FVC is moderately reduced, 2.01 L, 49% predicted *FEV1 moderately reduced, 1.41 L, 49% predicted *FEV1/FVC is normal, 70% *After bronchodilator the FVC is 58% predicted, 19% increase, FEV1 is 51% predicted, FEV1/FVC mildly reduced, 61%.  Interpretation: There is a moderate restrictive ventilatory defect prior to bronchodilator.  There is significant improvement with administration of bronchodilator for FVC but after administration of a bronchodilator there is an obstructive ventilatory defect.  Lung volumes show moderate restrictive ventilatory defect.  When compared to prior study, there is no significant trend.  PFTs: No flowsheet data found. See PFT data from outside hospital above  I ordered an in office EKG today, and if personally interpreted.  There are no ST segment changes.  There is evidence of right axis deviation and right bundle branch block.  There is no QRS prolongation.  The rhythm is sinus  Labs:  Immunization status: Immunization History  Administered Date(s) Administered  . Influenza,inj,Quad PF,6+ Mos 09/09/2019   up to date and documented for influenza, he will still need pneumonia vaccines.  Assessment:  Mr. Mayall is a 68 y.o. gentleman with a history of pulmonary sarcoidosis previously on oral glucocorticoids who presents for new patient evaluation.   1. Pulmonary Sarcoidosis  - Stage IV 2. History Tobacco Use Disorder -30-pack-year 3. OSA - untreated 4. 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  History of subdural hemorrhage, while on warfarin and NSAIDs  Plan/Recommendations:  Mr. 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  His evidence of significant scarring on his chest x-ray, as well as reports of CT chest in the past.  As he is new establishing care with me today I will obtain the following to assess his disease:  Baseline eye examination to evaluate for ocular sarcoidosis Baseline serum creatinine for renal sarcoidosis Baseline serum alkaline phosphatase for hepatic sarcoidosis Baseline serum calcium Baseline serum complete blood count Baseline EKG for cardiac sarcoidosis  He saw ophthalmology 3 months ago, and can wait until summer 2021 for his next appointment for screening for uveitis for sarcoidosis.  In addition, while we are gathering additional information with pulmonary function testing I will start Bactrim Monday Wednesday Friday and a daily PPI while he is continuing on prednisone 40 mg until her follow-up.  I wonder if he was on infliximab in the past for his sarcoidosis.  He will obtain some records from his previous infusion history in [place].  In the meantime, since he is having no adverse effects from the [facility], I think it is reasonable to continue this.  I have advised him to try taking the albuterol prior to activity to see if it relieves any of his dyspnea on exertion.  Pending the result of his PFTs, I think it will be important to either resume his TNF alpha infusions or start an oral agent such as azathioprine or methotrexate.  Unfortunately I am concerned a lot of his disease is progressed and may be fibrotic which would make treatment challenging.  Reference: Diagnosis and Detection of Sarcoidosis: An Official [employer] [place], Iss 8, pp e26-e51, Apr 08, 2019  Return to Care: Return in about 2 months (around 01/09/2020).  Lenice Llamas, MD Pulmonary and [place]  CC: Jeanmarie Hubert, MD

## 2019-11-18 ENCOUNTER — Ambulatory Visit (INDEPENDENT_AMBULATORY_CARE_PROVIDER_SITE_OTHER): Payer: Medicare Other | Admitting: Internal Medicine

## 2019-11-18 ENCOUNTER — Encounter: Payer: Self-pay | Admitting: Internal Medicine

## 2019-11-18 ENCOUNTER — Other Ambulatory Visit: Payer: Self-pay

## 2019-11-18 VITALS — BP 129/96 | HR 91 | Temp 98.0°F | Ht 69.0 in | Wt 234.9 lb

## 2019-11-18 DIAGNOSIS — Z79899 Other long term (current) drug therapy: Secondary | ICD-10-CM

## 2019-11-18 DIAGNOSIS — I10 Essential (primary) hypertension: Secondary | ICD-10-CM

## 2019-11-18 DIAGNOSIS — Z7952 Long term (current) use of systemic steroids: Secondary | ICD-10-CM

## 2019-11-18 DIAGNOSIS — Z7901 Long term (current) use of anticoagulants: Secondary | ICD-10-CM | POA: Diagnosis not present

## 2019-11-18 DIAGNOSIS — R350 Frequency of micturition: Secondary | ICD-10-CM

## 2019-11-18 DIAGNOSIS — Z87891 Personal history of nicotine dependence: Secondary | ICD-10-CM

## 2019-11-18 DIAGNOSIS — D860 Sarcoidosis of lung: Secondary | ICD-10-CM | POA: Diagnosis present

## 2019-11-18 DIAGNOSIS — Z131 Encounter for screening for diabetes mellitus: Secondary | ICD-10-CM

## 2019-11-18 DIAGNOSIS — D869 Sarcoidosis, unspecified: Secondary | ICD-10-CM

## 2019-11-18 DIAGNOSIS — R635 Abnormal weight gain: Secondary | ICD-10-CM

## 2019-11-18 DIAGNOSIS — T380X5A Adverse effect of glucocorticoids and synthetic analogues, initial encounter: Secondary | ICD-10-CM

## 2019-11-18 DIAGNOSIS — E669 Obesity, unspecified: Secondary | ICD-10-CM

## 2019-11-18 LAB — POCT GLYCOSYLATED HEMOGLOBIN (HGB A1C): Hemoglobin A1C: 5.8 % — AB (ref 4.0–5.6)

## 2019-11-18 LAB — GLUCOSE, CAPILLARY: Glucose-Capillary: 95 mg/dL (ref 70–99)

## 2019-11-18 MED ORDER — PREDNISONE 10 MG PO TABS: ORAL_TABLET | ORAL | 3 refills | Status: DC

## 2019-11-18 NOTE — Progress Notes (Signed)
   CC: Followup on pulmonary sarcoidosis  HPI: Patient is a 68 year old male with past medical history as below who presents for follow-up on pulmonary sarcoidosis.  [name] is a 68 y.o.   Past Medical History:  Diagnosis Date  . DVT of lower extremity, bilateral ([place])   . Hypertension   . Incarcerated umbilical hernia 07/222   Status post repair  . Lupus anticoagulant positive    Per records from [facility]  . 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 Pulmonary artery hypertension ([place]) 04/2011   Mildly elevated pulmonary artery pressure in setting of PE (from [facility])  . Pulmonary embolism ([place]) 04/2011  . Sarcoidosis 1989  . Subdural hematoma (HCC)    Remote episode, occurred when taking excedrin and warfarin.    Review of Systems:   Review of Systems  Constitutional: Negative for chills and fever.  HENT: Negative for congestion.   Respiratory: Positive for shortness of breath. Negative for cough.   Cardiovascular: Negative for chest pain.  Gastrointestinal: Negative for abdominal pain, constipation, diarrhea, nausea and vomiting.  Genitourinary: Positive for frequency. Negative for dysuria and urgency.  All other systems reviewed and are negative.   Physical Exam:  Vitals:   11/18/19 1057 11/18/19 1105  BP: (!) 141/103 (!) 129/96  Pulse: 95 91  Temp: 98 F (36.7 C)   TempSrc: Oral   SpO2: 95%   Weight: 234 lb 14.4 oz (106.5 kg)   Height: _0  (1.753 m)    Physical Exam  Constitutional: He is well-developed, well-nourished, and in no distress.  HENT:  Head: Normocephalic and atraumatic.  Eyes: EOM are normal. Right eye exhibits no discharge. Left eye exhibits no discharge.  Neck: Normal range of motion. No tracheal deviation present.  Cardiovascular: Normal rate and regular rhythm. Exam reveals no gallop and no friction rub.  No murmur heard. Pulmonary/Chest: Effort normal. No  respiratory distress. He has wheezes. He has no rales.  Abdominal: Soft. He exhibits no distension. There is no abdominal tenderness. There is no rebound and no guarding.  Musculoskeletal: Normal range of motion.        General: No tenderness, deformity or edema.  Neurological: He is alert. Coordination normal.  Skin: Skin is warm and dry. No rash noted. He is not diaphoretic. No erythema.  Psychiatric: Memory and judgment normal.     Assessment & Plan:   See Encounters Tab for problem based charting.  Patient seen and discussed with Dr. Evette Doffing

## 2019-11-18 NOTE — Assessment & Plan Note (Addendum)
Patient was seen 6 weeks ago at which time prednisone 40 mg daily was initiated.  At that time, patient had been experiencing worsening of respiratory symptoms.  Patient reports that since initiation of therapy, his symptoms have stabilized without worsening.  We will start titrating off prednisone therapy at 59-monthincrements: * Prednisone 30 mg daily for 2 months * Prednisone 20 mg daily for 2 months * Prednisone 10 mg daily for 2 months  Patient with follow-up appointment with pulmonology in January.  Will defer to specialist regarding any changes to this plan.  HgbA1C of 5.8, Glucose 92 in setting of prednisone therapy. Will continue to monitor. Patient advised on need for dietary modification.  Patient on Bactrim for PJP prophylaxis.  On PPI for GERD prevention on his prednisone therapy.  Pharmacy is aware of this in regards to his warfarin dosing.  Pulmonology considering either resuming TNF alpha infusions or starting oral agent such as azathioprine or methotrexate.  Pulmonology assesses that disease modifying treatment may be challenging due to advanced, fibrotic state of lungs.

## 2019-11-18 NOTE — Patient Instructions (Addendum)
You were seen in clinic for follow-up on your pulmonary sarcoidosis. We want you to take prednisone as follows: * 30 mg for 2 months * 20 mg for the next 2 months * 10 mg for the next 2 months  Thank you for allowing [place] to be part of your medical care!

## 2019-11-18 NOTE — Assessment & Plan Note (Signed)
BP Readings from Last 3 Encounters:  11/18/19 (!) 129/96  11/09/19 134/72  11/05/19 120/82   Blood pressure well controlled on losartan-hydrochlorothiazide 50-12.5 mg daily.  Will continue on current regimen.

## 2019-11-21 NOTE — Progress Notes (Signed)
Internal Medicine Clinic Attending  I saw and evaluated the patient.  I personally confirmed the key portions of the history and exam documented by Dr. Benjamine Mola and I reviewed pertinent patient test results.  The assessment, diagnosis, and plan were formulated together and I agree with the documentation in the resident's note.

## 2019-11-21 NOTE — Addendum Note (Signed)
Addended by: Lalla Brothers T on: 11/21/2019 02:02 PM   Modules accepted: Level of Service

## 2019-11-23 ENCOUNTER — Ambulatory Visit (INDEPENDENT_AMBULATORY_CARE_PROVIDER_SITE_OTHER): Payer: Medicare Other | Admitting: Pharmacist

## 2019-11-23 ENCOUNTER — Other Ambulatory Visit: Payer: Self-pay

## 2019-11-23 DIAGNOSIS — Z86711 Personal history of pulmonary embolism: Secondary | ICD-10-CM

## 2019-11-23 DIAGNOSIS — Z86718 Personal history of other venous thrombosis and embolism: Secondary | ICD-10-CM

## 2019-11-23 DIAGNOSIS — Z5181 Encounter for therapeutic drug level monitoring: Secondary | ICD-10-CM | POA: Diagnosis not present

## 2019-11-23 DIAGNOSIS — Z7901 Long term (current) use of anticoagulants: Secondary | ICD-10-CM

## 2019-11-23 DIAGNOSIS — I2699 Other pulmonary embolism without acute cor pulmonale: Secondary | ICD-10-CM

## 2019-11-23 LAB — POCT INR: INR: 2.1 (ref 2.0–3.0)

## 2019-11-23 MED ORDER — WARFARIN SODIUM 4 MG PO TABS: ORAL_TABLET | ORAL | 2 refills | Status: DC

## 2019-11-23 NOTE — Progress Notes (Signed)
I reviewed Dr. Gladstone Pih note.  Patient is on [facility] for DVT/PE.  INR at low end of goal and dose increased.

## 2019-11-23 NOTE — Patient Instructions (Signed)
Patient instructed to take medications as defined in the Anti-coagulation Track section of this encounter.  Patient instructed to take today's dose.  Patient instructed to take  one of your 50m-blue warfarin tablets, by mouth, once-daily at [facility]--EXCEPT on Sundays, Tuesdays, Thursdays and Saturdays--take ONLY 1/2 tablet on these days.  Patient verbalized understanding of these instructions.

## 2019-11-23 NOTE — Progress Notes (Signed)
Anticoagulation Management Jesus Russell is a 68 y.o. male who reports to the clinic for monitoring of warfarin treatment.    Indication: DVT, History of (resolved); PE, History of (resolved); Long term current use of anticoagulants.  Duration: indefinite Supervising physician: Gilles Chiquito  Anticoagulation Clinic Visit History: Patient does not report signs/symptoms of bleeding or thromboembolism  Other recent changes: No diet, medications, lifestyle, except as noted in patient findings.  Anticoagulation Episode Summary    Current INR goal:  2.0-3.0  TTR:  100.0 % (2 mo)  Next INR check:  12/14/2019  INR from last check:  2.1 (11/23/2019)  Weekly max warfarin dose:    Target end date:  Indefinite  INR check location:    Preferred lab:    Send INR reminders to:     Indications   Long term (current) use of anticoagulants [Z79.01] History of DVT (deep vein thrombosis) [Z86.718] Pulmonary embolism on long-term anticoagulation therapy (HCC) [I26.99 T45.515A] History of pulmonary embolism [Z86.711]       Comments:          No Known Allergies  Current Outpatient Medications:  .  cholecalciferol (VITAMIN D3) 25 MCG (1000 UT) tablet, Take 1,000 Units by mouth daily., Disp: , Rfl:  .  fluticasone furoate-vilanterol (BREO ELLIPTA) 200-25 MCG/INH AEPB, Inhale 1 puff into the lungs daily., Disp: 60 each, Rfl: 2 .  losartan-hydrochlorothiazide (HYZAAR) 50-12.5 MG tablet, Take 1 tablet by mouth daily., Disp: , Rfl:  .  omeprazole (PRILOSEC) 40 MG capsule, Take 1 capsule (40 mg total) by mouth daily., Disp: 90 capsule, Rfl: 3 .  predniSONE (DELTASONE) 10 MG tablet, Take 30 mg for 2 months, then 20 mg for 2 months, then 10 mg for 2 months, Disp: 90 tablet, Rfl: 3 .  sulfamethoxazole-trimethoprim (BACTRIM) 400-80 MG tablet, Take 1 tablet by mouth 3 (three) times a week., Disp: 36 tablet, Rfl: 3 .  warfarin (COUMADIN) 4 MG tablet, Take one-half (1/2) of your 51m warfarin tablet on Sundays,  Tuesdays, Thursdays and Saturdays. All other days, take one (1) tablet., Disp: 20 tablet, Rfl: 2 .  zinc gluconate 50 MG tablet, Take 50 mg by mouth daily., Disp: , Rfl:  Past Medical History:  Diagnosis Date  . DVT of lower extremity, bilateral ([place]   . Hypertension   . Incarcerated umbilical hernia 032/3557  Status post repair  . Lupus anticoagulant positive    Per records from [facility]  . 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 [place] [facility]  . [place] 04[place]   Mildly elevated pulmonary artery pressure in setting of PE (from [facility])  . [place]monary [place]ism [place] 04/[place]  . Sarcoidosis 1989  . Subdural hematoma (HCC)    Remote episode, occurred when taking excedrin and warfarin.    Social History   Socioeconomic History  . Marital status: Divorced    Spouse name: Not on file  . Number of children: Not on file  . Years of education: Not on file  . Highest education level: Not on file  Occupational History  . Occupation: retired tTour manager . Financial resource strain: Not very hard  . Food insecurity    Worry: Not on file    Inability: Not on file  . Transportation needs    Medical: Not on file    Non-medical: Not on file  Tobacco Use  . Smoking status: Former Smoker    Packs/day:  1.50    Years: 23.00    Pack years: 34.50    Types: Cigarettes    Quit date: 28    Years since quitting: 25.9  . Smokeless tobacco: Never Used  Substance and Sexual Activity  . Alcohol use: Never    Frequency: Never  . Drug use: Not Currently    Types: "Crack" cocaine    Comment: quit 2008  . Sexual activity: Not on file  Lifestyle  . Physical activity    Days per week: Not on file    Minutes per session: Not on file  . Stress: Not on file  Relationships  . Social Herbalist on phone: Not on file    Gets together: Not on file    Attends religious service: Not on file     Active member of club or organization: Not on file    Attends meetings of clubs or organizations: Not on file    Relationship status: Not on file  Other Topics Concern  . Not on file  Social History Narrative  . Not on file   Family History  Adopted: Yes    ASSESSMENT Recent Results: The most recent result is correlated with 18 mg per week: Lab Results  Component Value Date   INR 2.1 11/23/2019   INR 3.0 11/02/2019   INR 2.3 10/06/2019    Anticoagulation Dosing: Description   Take one of your 86m-blue warfarin tablets, by mouth, once-daily at 6PM each day--EXCEPT on Sundays, Tuesdays, Thursdays and Saturdays--take ONLY 1/2 tablet.      INR today: Therapeutic  PLAN Weekly dose was increased by 10% to 20 mg per week  Patient Instructions  Patient instructed to take medications as defined in the Anti-coagulation Track section of this encounter.  Patient instructed to take today's dose.  Patient instructed to take  one of your 481mblue warfarin tablets, by mouth, [facility]-daily [facility]--EXCEPT on Sundays, Tuesdays, Thursdays and Saturdays--take ONLY 1/2 tablet on these days.  Patient verbalized understanding of these instructions.    Patient advised to contact clinic or seek medical attention if signs/symptoms of bleeding or thromboembolism occur.  Patient verbalized understanding by repeating back information and was advised to contact me if further medication-related questions arise. Patient was also provided an information handout.  Follow-up Return in 3 weeks (on 12/14/2019) for Follow up INR.  Jesus BanterPharmD, CPP  15 minutes spent face-to-face with the patient during the encounter. 50% of time spent on education, including signs/sx bleeding and clotting, as well as food and drug interactions with warfarin. 50% of time was spent on fingerprick POC INR sample collection,processing, results determination, and documentation in [email or web address]

## 2019-11-25 ENCOUNTER — Other Ambulatory Visit: Payer: Self-pay | Admitting: Pharmacist

## 2019-11-29 ENCOUNTER — Other Ambulatory Visit: Payer: Self-pay | Admitting: Internal Medicine

## 2019-11-29 DIAGNOSIS — D869 Sarcoidosis, unspecified: Secondary | ICD-10-CM

## 2019-12-01 ENCOUNTER — Other Ambulatory Visit: Payer: Self-pay | Admitting: Emergency Medicine

## 2019-12-01 DIAGNOSIS — Z1159 Encounter for screening for other viral diseases: Secondary | ICD-10-CM

## 2019-12-01 DIAGNOSIS — Z1211 Encounter for screening for malignant neoplasm of colon: Secondary | ICD-10-CM

## 2019-12-01 MED ORDER — SUPREP BOWEL PREP KIT 17.5-3.13-1.6 GM/177ML PO SOLN: 1.0000 | ORAL | 0 refills | Status: DC

## 2019-12-01 NOTE — Telephone Encounter (Signed)
Patient has been scheduled for colonoscopy on 01-06-2020. Thank you for all your help!

## 2019-12-14 ENCOUNTER — Ambulatory Visit (INDEPENDENT_AMBULATORY_CARE_PROVIDER_SITE_OTHER): Payer: Medicare Other | Admitting: Pharmacist

## 2019-12-14 DIAGNOSIS — Z86711 Personal history of pulmonary embolism: Secondary | ICD-10-CM

## 2019-12-14 DIAGNOSIS — Z7901 Long term (current) use of anticoagulants: Secondary | ICD-10-CM

## 2019-12-14 DIAGNOSIS — I2699 Other pulmonary embolism without acute cor pulmonale: Secondary | ICD-10-CM

## 2019-12-14 DIAGNOSIS — Z86718 Personal history of other venous thrombosis and embolism: Secondary | ICD-10-CM

## 2019-12-14 DIAGNOSIS — Z5181 Encounter for therapeutic drug level monitoring: Secondary | ICD-10-CM

## 2019-12-14 LAB — POCT INR: INR: 6.7 — AB (ref 2.0–3.0)

## 2019-12-14 NOTE — Progress Notes (Signed)
Anticoagulation Management Jesus Russell is a 68 y.o. male who reports to the clinic for monitoring of warfarin treatment.    Indication: PE, History of (resolved); DVT, History of (resolved). Long term current use of anticoagulant.  Duration: indefinite Supervising physician: Lalla Brothers  Anticoagulation Clinic Visit History: Patient does not report signs/symptoms of bleeding or thromboembolism  Other recent changes: No diet, medications, lifestyle changes except as noted on patient findings tab.  Anticoagulation Episode Summary    Current INR goal:  2.0-3.0  TTR:  79.3 % (2.7 mo)  Next INR check:  12/21/2019  INR from last check:    Weekly max warfarin dose:    Target end date:  Indefinite  INR check location:    Preferred lab:    Send INR reminders to:     Indications   Long term (current) use of anticoagulants [Z79.01] History of DVT (deep vein thrombosis) [Z86.718] Pulmonary embolism on long-term anticoagulation therapy (HCC) [I26.99 T45.515A] History of pulmonary embolism [Z86.711]       Comments:          No Known Allergies  Current Outpatient Medications:  .  BREO ELLIPTA 200-25 MCG/INH AEPB, INHALE 1 PUFF INTO THE LUNGS DAILY, Disp: 60 each, Rfl: 2 .  losartan-hydrochlorothiazide (HYZAAR) 50-12.5 MG tablet, Take 1 tablet by mouth daily., Disp: , Rfl:  .  warfarin (COUMADIN) 4 MG tablet, TAKE 1/2 OF YOUR 4 MG WARFARIN TABLET ON SUNDAYS, TUESDAYS, THURSDAYS AND SATURDAYS. ALL OTHER DAYS TAKE 1 TABLET, Disp: 64 tablet, Rfl: 1 .  cholecalciferol (VITAMIN D3) 25 MCG (1000 UT) tablet, Take 1,000 Units by mouth daily., Disp: , Rfl:  .  Na Sulfate-K Sulfate-Mg Sulf (SUPREP BOWEL PREP KIT) 17.5-3.13-1.6 GM/177ML SOLN, Take 1 kit by mouth as directed. (Patient not taking: Reported on 12/14/2019), Disp: 324 mL, Rfl: 0 .  omeprazole (PRILOSEC) 40 MG capsule, Take 1 capsule (40 mg total) by mouth daily. (Patient not taking: Reported on 12/14/2019), Disp: 90 capsule, Rfl: 3 .   predniSONE (DELTASONE) 10 MG tablet, Take 30 mg for 2 months, then 20 mg for 2 months, then 10 mg for 2 months (Patient not taking: Reported on 12/14/2019), Disp: 90 tablet, Rfl: 3 .  sulfamethoxazole-trimethoprim (BACTRIM) 400-80 MG tablet, Take 1 tablet by mouth 3 (three) times a week. (Patient not taking: Reported on 12/14/2019), Disp: 36 tablet, Rfl: 3 .  zinc gluconate 50 MG tablet, Take 50 mg by mouth daily., Disp: , Rfl:  Past Medical History:  Diagnosis Date  . DVT of lower extremity, bilateral ([place])   . Hypertension   . Incarcerated umbilical hernia 65/7846   Status post repair  . Lupus anticoagulant positive    Per records from [facility]  . 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 Pulmonary artery hypertension ([place]) 04/2011   Mildly elevated pulmonary artery pressure in setting of PE (from [facility])  . Pulmonary embolism ([place]) 04/2011  . Sarcoidosis 1989  . Subdural hematoma (HCC)    Remote episode, occurred when taking excedrin and warfarin.    Social History   Socioeconomic History  . Marital status: Divorced    Spouse name: Not on file  . Number of children: Not on file  . Years of education: Not on file  . Highest education level: Not on file  Occupational History  . Occupation: retired Pharmacist, hospital  Tobacco Use  . Smoking status: Former Smoker    Packs/day:  1.50    Years: 23.00    Pack years: 34.50    Types: Cigarettes    Quit date: 17    Years since quitting: 25.9  . Smokeless tobacco: Never Used  Substance and Sexual Activity  . Alcohol use: Never  . Drug use: Not Currently    Types: "Crack" cocaine    Comment: quit 2008  . Sexual activity: Not on file  Other Topics Concern  . Not on file  Social History Narrative  . Not on file   Social Determinants of Health   Financial Resource Strain: Low Risk   . Difficulty of Paying Living Expenses: Not very hard  Food Insecurity:   .  Worried About Charity fundraiser in the Last Year: Not on file  . Ran Out of Food in the Last Year: Not on file  Transportation Needs:   . Lack of Transportation (Medical): Not on file  . Lack of Transportation (Non-Medical): Not on file  Physical Activity:   . Days of Exercise per Week: Not on file  . Minutes of Exercise per Session: Not on file  Stress:   . Feeling of Stress : Not on file  Social Connections:   . Frequency of Communication with Friends and Family: Not on file  . Frequency of Social Gatherings with Friends and Family: Not on file  . Attends Religious Services: Not on file  . Active Member of Clubs or Organizations: Not on file  . Attends Archivist Meetings: Not on file  . Marital Status: Not on file   Family History  Adopted: Yes    ASSESSMENT Recent Results: The most recent result is correlated with 20 mg per week: Lab Results  Component Value Date   INR 6.7 (A) 12/14/2019   INR 2.1 11/23/2019   INR 3.0 11/02/2019    Anticoagulation Dosing: Description   Take one-half of your 74m-blue warfarin tablets, by mouth, [facility]--EXCEPT on Mondays, --take one (1) tablet each Monday. OMIT doses of warfarin for Tuesday 22-DEC and Wednesday 23-DEC, 2020. Resume warfarin on Thursday, 17-Dec-2019.      INR today: Supratherapeutic  PLAN Weekly dose was decreased by 20% to 16 mg per week  Patient Instructions  Patient instructed to take medications as defined in the Anti-coagulation Track section of this encounter.  Patient instructed to take today's dose (has already taken, otherwise, would have HELD/OMITTED TODAY's DOSE).  Patient instructed to takeone-half of your 442mblue warfarin tablets, by mouth, [facility]--EXCEPT on Mondays, --take one (1) tablet each Monday. OMIT doses of warfarin for Tuesday 22-DEC and Wednesday 23-DEC, 2020. Resume warfarin on Thursday, 17-Dec-2019.  Patient verbalized understanding of these  instructions.    Patient advised to contact clinic or seek medical attention if signs/symptoms of bleeding or thromboembolism occur.  Patient verbalized understanding by repeating back information and was advised to contact me if further medication-related questions arise. Patient was also provided an information handout.  Follow-up Return in 1 week (on 12/21/2019) for Follow up INR.  JaPennie BanterPharmD, CPP  15 minutes spent face-to-face with the patient during the encounter. 50% of time spent on education, including signs/sx bleeding and clotting, as well as food and drug interactions with warfarin. 50% of time was spent on fingerprick POC INR sample collection,processing, results determination, and documentation in [email or web address]

## 2019-12-14 NOTE — Patient Instructions (Signed)
Patient instructed to take medications as defined in the Anti-coagulation Track section of this encounter.  Patient instructed to take today's dose (has already taken, otherwise, would have HELD/OMITTED TODAY's DOSE).  Patient instructed to takeone-half of your 34m-blue warfarin tablets, by mouth, [facility]--EXCEPT on Mondays, --take one (1) tablet each Monday. OMIT doses of warfarin for Tuesday 22-DEC and Wednesday 23-DEC, 2020. Resume warfarin on Thursday, 17-Dec-2019.  Patient verbalized understanding of these instructions.

## 2019-12-15 ENCOUNTER — Other Ambulatory Visit: Payer: Self-pay | Admitting: *Deleted

## 2019-12-15 NOTE — Progress Notes (Signed)
INTERNAL MEDICINE TEACHING ATTENDING ADDENDUM ° °I agree with pharmacy recommendations as outlined in their note.  ° °-Norvell Ureste MD ° °

## 2019-12-16 MED ORDER — LOSARTAN POTASSIUM-HCTZ 50-12.5 MG PO TABS: 1.0000 | ORAL_TABLET | Freq: Every day | ORAL | 1 refills | Status: DC

## 2019-12-28 ENCOUNTER — Telehealth: Payer: Self-pay | Admitting: Internal Medicine

## 2019-12-28 NOTE — Telephone Encounter (Signed)
Pt will call back for an appt

## 2019-12-28 NOTE — Telephone Encounter (Signed)
Pt requesting a nurse to call back. °

## 2019-12-28 NOTE — Telephone Encounter (Signed)
rtc to pt, he states his back started causing him pain while in [place] for the holidays, went to local ED and was told he has fractures in his back [place] wed 1/6 at [id number]

## 2019-12-29 ENCOUNTER — Ambulatory Visit: Payer: Medicare Other

## 2019-12-30 ENCOUNTER — Ambulatory Visit: Payer: Medicare Other

## 2020-01-04 ENCOUNTER — Other Ambulatory Visit (HOSPITAL_COMMUNITY): Payer: Medicare Other

## 2020-01-06 ENCOUNTER — Ambulatory Visit: Payer: Medicare Other | Attending: Internal Medicine

## 2020-01-06 ENCOUNTER — Encounter: Payer: Medicare Other | Admitting: Gastroenterology

## 2020-01-06 DIAGNOSIS — Z20822 Contact with and (suspected) exposure to covid-19: Secondary | ICD-10-CM

## 2020-01-07 LAB — NOVEL CORONAVIRUS, NAA: SARS-CoV-2, NAA: DETECTED — AB

## 2020-01-08 ENCOUNTER — Ambulatory Visit: Payer: Medicare Other | Admitting: Internal Medicine

## 2020-01-12 ENCOUNTER — Ambulatory Visit: Payer: Medicare Other

## 2020-01-20 ENCOUNTER — Encounter: Payer: Self-pay | Admitting: Internal Medicine

## 2020-01-20 ENCOUNTER — Ambulatory Visit (INDEPENDENT_AMBULATORY_CARE_PROVIDER_SITE_OTHER): Payer: Medicare Other | Admitting: Internal Medicine

## 2020-01-20 VITALS — BP 150/99 | HR 101 | Temp 98.1°F | Ht 69.0 in | Wt 232.8 lb

## 2020-01-20 DIAGNOSIS — S12590K Other displaced fracture of sixth cervical vertebra, subsequent encounter for fracture with nonunion: Secondary | ICD-10-CM

## 2020-01-20 DIAGNOSIS — G5621 Lesion of ulnar nerve, right upper limb: Secondary | ICD-10-CM | POA: Diagnosis not present

## 2020-01-20 DIAGNOSIS — S22050K Wedge compression fracture of T5-T6 vertebra, subsequent encounter for fracture with nonunion: Secondary | ICD-10-CM | POA: Diagnosis not present

## 2020-01-20 DIAGNOSIS — M8080XA Other osteoporosis with current pathological fracture, unspecified site, initial encounter for fracture: Secondary | ICD-10-CM

## 2020-01-20 DIAGNOSIS — M810 Age-related osteoporosis without current pathological fracture: Secondary | ICD-10-CM | POA: Insufficient documentation

## 2020-01-20 DIAGNOSIS — S22050A Wedge compression fracture of T5-T6 vertebra, initial encounter for closed fracture: Secondary | ICD-10-CM | POA: Insufficient documentation

## 2020-01-20 NOTE — Patient Instructions (Addendum)
Thank you for allowing [place] to provide your care. Today we discussed:   1) Compression fractures. We are gonna have you sign a release form and try to obtain the images. In the meantime I have referred you on to interventional radiology for evaluation for possible kyphoplasty.   2) Osteoporosis. Due to your chronic steroid use you likely have weakened bones. Given that you have had compression fractures you now qualify as having osteoporosis. I would like you to come back in six weeks to be started on a bisphosphonate.   3) Cubital tunnel syndrome. Her hand numbness is likely secondary to cubital tunnel syndrome. You can go to [place] [facility], [facility], or [facility] and pick up an elbow splint to wear at night. When working at your desk please try to rest her elbow on a pad.  Please come back in six weeks.

## 2020-01-20 NOTE — Assessment & Plan Note (Signed)
Patient describes persistent numbness/tingling in his fourth and fifth digit on his right hand. This is been going on for several weeks. He notes that he frequently reviewed since that that his desk with his right elbow resting on the desk. He has not noticed any weakness. He is never had anything like this before. On physical exam he has reproducible symptoms by tapping the cubital tunnel.  A/P: - Discussed that he is likely suffering from cubital tunnel syndrome. Recommended conservative treatment with splinting and padding. He voices understanding.

## 2020-01-20 NOTE — Assessment & Plan Note (Addendum)
In late December the patient was in [place] visiting friends. He had sudden onset back pain. He presented to the emergency department an MRI was performed. This illustrated compression fractures of both T6 and T8. He does not have the records with him. Epic is unable to link to the hospital system.  [place]  We will obtain a records release form and attempt to get imaging. In the meantime I have referred him onto interventional radiology for assessment of kyphoplasty.

## 2020-01-20 NOTE — Assessment & Plan Note (Signed)
Given the patient's compression fractures he has osteoporosis. 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 We discussed the need to start bisphosphonate therapy. He will come back in six weeks and started at that point.

## 2020-01-20 NOTE — Progress Notes (Signed)
   CC: Compression fracture, osteoporosis, cubital tunnel syndrome  HPI:  [name] is a 69 y.o. male with PMHx listed below presenting for Compression fracture, osteoporosis, cubital tunnel syndrome. Please see the A&P for the status of the patient's chronic medical problems.  Past Medical History:  Diagnosis Date  . DVT of lower extremity, bilateral ([place])   . Hypertension   . Incarcerated umbilical hernia 59/9357   Status post repair  . Lupus anticoagulant positive    Per records from [facility]  . 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 Pulmonary artery hypertension ([place]) 04/2011   Mildly elevated pulmonary artery pressure in setting of PE (from [facility])  . Pulmonary embolism ([place]) 04/2011  . Sarcoidosis 1989  . Subdural hematoma (HCC)    Remote episode, occurred when taking excedrin and warfarin.    Review of Systems:  Performed and all others negative.  Physical Exam: Vitals:   01/20/20 1349  BP: (!) 150/99  Pulse: (!) 101  Temp: 98.1 F (36.7 C)  TempSrc: Oral  SpO2: 90%  Weight: 232 lb 12.8 oz (105.6 kg)  Height: _0  (1.753 m)   General: Well nourished male in no acute distress Pulm: Good air movement with no wheezing or crackles  CV: RRR, no murmurs, no rubs   Assessment & Plan:   See Encounters Tab for problem based charting.  Patient discussed with Dr. Dareen Piano

## 2020-01-21 NOTE — Addendum Note (Signed)
Addended byIna Homes T on: 01/21/2020 10:52 AM   Modules accepted: Orders

## 2020-01-21 NOTE — Progress Notes (Signed)
Internal Medicine Clinic Attending  Case discussed with Dr. Helberg at the time of the visit.  We reviewed the resident's history and exam and pertinent patient test results.  I agree with the assessment, diagnosis, and plan of care documented in the resident's note.    

## 2020-01-24 ENCOUNTER — Other Ambulatory Visit: Payer: Self-pay

## 2020-01-24 ENCOUNTER — Inpatient Hospital Stay (HOSPITAL_COMMUNITY)
Admission: EM | Admit: 2020-01-24 | Discharge: 2020-01-26 | DRG: 196 | Disposition: A | Discharge status: Home / Self Care | Payer: Medicare Other | Attending: Student in an Organized Health Care Education/Training Program | Admitting: Student in an Organized Health Care Education/Training Program

## 2020-01-24 ENCOUNTER — Emergency Department (HOSPITAL_COMMUNITY): Payer: Medicare Other

## 2020-01-24 ENCOUNTER — Encounter (HOSPITAL_COMMUNITY): Payer: Self-pay | Admitting: Emergency Medicine

## 2020-01-24 DIAGNOSIS — M5136 Other intervertebral disc degeneration, lumbar region: Secondary | ICD-10-CM | POA: Diagnosis present

## 2020-01-24 DIAGNOSIS — Z79899 Other long term (current) drug therapy: Secondary | ICD-10-CM

## 2020-01-24 DIAGNOSIS — Z7951 Long term (current) use of inhaled steroids: Secondary | ICD-10-CM

## 2020-01-24 DIAGNOSIS — Z8616 Personal history of COVID-19: Secondary | ICD-10-CM

## 2020-01-24 DIAGNOSIS — D860 Sarcoidosis of lung: Principal | ICD-10-CM | POA: Diagnosis present

## 2020-01-24 DIAGNOSIS — S22050A Wedge compression fracture of T5-T6 vertebra, initial encounter for closed fracture: Secondary | ICD-10-CM | POA: Diagnosis present

## 2020-01-24 DIAGNOSIS — M4854XA Collapsed vertebra, not elsewhere classified, thoracic region, initial encounter for fracture: Secondary | ICD-10-CM | POA: Diagnosis present

## 2020-01-24 DIAGNOSIS — D6862 Lupus anticoagulant syndrome: Secondary | ICD-10-CM | POA: Diagnosis present

## 2020-01-24 DIAGNOSIS — M810 Age-related osteoporosis without current pathological fracture: Secondary | ICD-10-CM | POA: Diagnosis present

## 2020-01-24 DIAGNOSIS — I10 Essential (primary) hypertension: Secondary | ICD-10-CM | POA: Diagnosis present

## 2020-01-24 DIAGNOSIS — Z8679 Personal history of other diseases of the circulatory system: Secondary | ICD-10-CM

## 2020-01-24 DIAGNOSIS — M62830 Muscle spasm of back: Secondary | ICD-10-CM

## 2020-01-24 DIAGNOSIS — M549 Dorsalgia, unspecified: Secondary | ICD-10-CM | POA: Diagnosis present

## 2020-01-24 DIAGNOSIS — Z87891 Personal history of nicotine dependence: Secondary | ICD-10-CM

## 2020-01-24 DIAGNOSIS — G4733 Obstructive sleep apnea (adult) (pediatric): Secondary | ICD-10-CM | POA: Diagnosis present

## 2020-01-24 DIAGNOSIS — Z86718 Personal history of other venous thrombosis and embolism: Secondary | ICD-10-CM

## 2020-01-24 DIAGNOSIS — J9600 Acute respiratory failure, unspecified whether with hypoxia or hypercapnia: Secondary | ICD-10-CM | POA: Insufficient documentation

## 2020-01-24 DIAGNOSIS — D869 Sarcoidosis, unspecified: Secondary | ICD-10-CM | POA: Diagnosis present

## 2020-01-24 DIAGNOSIS — Z7901 Long term (current) use of anticoagulants: Secondary | ICD-10-CM

## 2020-01-24 DIAGNOSIS — J9621 Acute and chronic respiratory failure with hypoxia: Secondary | ICD-10-CM | POA: Diagnosis present

## 2020-01-24 DIAGNOSIS — J8410 Pulmonary fibrosis, unspecified: Secondary | ICD-10-CM | POA: Diagnosis present

## 2020-01-24 DIAGNOSIS — Z86711 Personal history of pulmonary embolism: Secondary | ICD-10-CM

## 2020-01-24 DIAGNOSIS — J9601 Acute respiratory failure with hypoxia: Secondary | ICD-10-CM

## 2020-01-24 DIAGNOSIS — M48061 Spinal stenosis, lumbar region without neurogenic claudication: Secondary | ICD-10-CM | POA: Diagnosis present

## 2020-01-24 DIAGNOSIS — R0902 Hypoxemia: Secondary | ICD-10-CM | POA: Diagnosis not present

## 2020-01-24 LAB — CBC
HCT: 48.9 % (ref 39.0–52.0)
Hemoglobin: 15.8 g/dL (ref 13.0–17.0)
MCH: 32.4 pg (ref 26.0–34.0)
MCHC: 32.3 g/dL (ref 30.0–36.0)
MCV: 100.2 fL — ABNORMAL HIGH (ref 80.0–100.0)
Platelets: 210 10*3/uL (ref 150–400)
RBC: 4.88 MIL/uL (ref 4.22–5.81)
RDW: 15.7 % — ABNORMAL HIGH (ref 11.5–15.5)
WBC: 14.4 10*3/uL — ABNORMAL HIGH (ref 4.0–10.5)
nRBC: 0 % (ref 0.0–0.2)

## 2020-01-24 LAB — CBC WITH DIFFERENTIAL/PLATELET
Abs Immature Granulocytes: 0.11 10*3/uL — ABNORMAL HIGH (ref 0.00–0.07)
Basophils Absolute: 0 10*3/uL (ref 0.0–0.1)
Basophils Relative: 0 %
Eosinophils Absolute: 0.1 10*3/uL (ref 0.0–0.5)
Eosinophils Relative: 1 %
HCT: 52.5 % — ABNORMAL HIGH (ref 39.0–52.0)
Hemoglobin: 16.7 g/dL (ref 13.0–17.0)
Immature Granulocytes: 1 %
Lymphocytes Relative: 11 %
Lymphs Abs: 1.6 10*3/uL (ref 0.7–4.0)
MCH: 31.9 pg (ref 26.0–34.0)
MCHC: 31.8 g/dL (ref 30.0–36.0)
MCV: 100.4 fL — ABNORMAL HIGH (ref 80.0–100.0)
Monocytes Absolute: 1.1 10*3/uL — ABNORMAL HIGH (ref 0.1–1.0)
Monocytes Relative: 8 %
Neutro Abs: 11.3 10*3/uL — ABNORMAL HIGH (ref 1.7–7.7)
Neutrophils Relative %: 79 %
Platelets: 215 10*3/uL (ref 150–400)
RBC: 5.23 MIL/uL (ref 4.22–5.81)
RDW: 15.6 % — ABNORMAL HIGH (ref 11.5–15.5)
WBC: 14.2 10*3/uL — ABNORMAL HIGH (ref 4.0–10.5)
nRBC: 0 % (ref 0.0–0.2)

## 2020-01-24 LAB — CREATININE, SERUM
Creatinine, Ser: 0.84 mg/dL (ref 0.61–1.24)
GFR calc Af Amer: >60 mL/min (ref >60)
GFR calc non Af Amer: >60 mL/min (ref >60)

## 2020-01-24 LAB — POCT I-STAT 7, (LYTES, BLD GAS, ICA,H+H)
Acid-Base Excess: 4 mmol/L — ABNORMAL HIGH (ref 0.0–2.0)
Bicarbonate: 31.4 mmol/L — ABNORMAL HIGH (ref 20.0–28.0)
Calcium, Ion: 1.28 mmol/L (ref 1.15–1.40)
HCT: 49 % (ref 39.0–52.0)
Hemoglobin: 16.7 g/dL (ref 13.0–17.0)
O2 Saturation: 98 %
Patient temperature: 98.6
Potassium: 3.8 mmol/L (ref 3.5–5.1)
Sodium: 135 mmol/L (ref 135–145)
TCO2: 33 mmol/L — ABNORMAL HIGH (ref 22–32)
pCO2 arterial: 56.9 mmHg — ABNORMAL HIGH (ref 32.0–48.0)
pH, Arterial: 7.349 — ABNORMAL LOW (ref 7.350–7.450)
pO2, Arterial: 110 mmHg — ABNORMAL HIGH (ref 83.0–108.0)

## 2020-01-24 LAB — PROCALCITONIN: Procalcitonin: <0.1 ng/mL

## 2020-01-24 LAB — COMPREHENSIVE METABOLIC PANEL
ALT: 31 U/L (ref 0–44)
AST: 19 U/L (ref 15–41)
Albumin: 3.8 g/dL (ref 3.5–5.0)
Alkaline Phosphatase: 64 U/L (ref 38–126)
Anion gap: 13 (ref 5–15)
BUN: 21 mg/dL (ref 8–23)
CO2: 25 mmol/L (ref 22–32)
Calcium: 9.5 mg/dL (ref 8.9–10.3)
Chloride: 99 mmol/L (ref 98–111)
Creatinine, Ser: 1.03 mg/dL (ref 0.61–1.24)
GFR calc Af Amer: >60 mL/min (ref >60)
GFR calc non Af Amer: >60 mL/min (ref >60)
Glucose, Bld: 86 mg/dL (ref 70–99)
Potassium: 3.8 mmol/L (ref 3.5–5.1)
Sodium: 137 mmol/L (ref 135–145)
Total Bilirubin: 1 mg/dL (ref 0.3–1.2)
Total Protein: 6.9 g/dL (ref 6.5–8.1)

## 2020-01-24 LAB — TRIGLYCERIDES: Triglycerides: 122 mg/dL (ref <150)

## 2020-01-24 LAB — LACTIC ACID, PLASMA: Lactic Acid, Venous: 1.6 mmol/L (ref 0.5–1.9)

## 2020-01-24 LAB — TROPONIN I (HIGH SENSITIVITY)
Troponin I (High Sensitivity): 9 ng/L (ref <18)
Troponin I (High Sensitivity): 10 ng/L (ref <18)

## 2020-01-24 LAB — PROTIME-INR
INR: 2.8 — ABNORMAL HIGH (ref 0.8–1.2)
Prothrombin Time: 29.1 seconds — ABNORMAL HIGH (ref 11.4–15.2)

## 2020-01-24 LAB — LACTATE DEHYDROGENASE: LDH: 232 U/L — ABNORMAL HIGH (ref 98–192)

## 2020-01-24 LAB — BRAIN NATRIURETIC PEPTIDE: B Natriuretic Peptide: 67.4 pg/mL (ref 0.0–100.0)

## 2020-01-24 LAB — C-REACTIVE PROTEIN: CRP: 1.2 mg/dL — ABNORMAL HIGH (ref <1.0)

## 2020-01-24 LAB — FIBRINOGEN: Fibrinogen: 483 mg/dL — ABNORMAL HIGH (ref 210–475)

## 2020-01-24 LAB — FERRITIN: Ferritin: 134 ng/mL (ref 24–336)

## 2020-01-24 LAB — D-DIMER, QUANTITATIVE: D-Dimer, Quant: 0.47 ug/mL-FEU (ref 0.00–0.50)

## 2020-01-24 LAB — HIV ANTIBODY (ROUTINE TESTING W REFLEX): HIV Screen 4th Generation wRfx: NONREACTIVE

## 2020-01-24 MED ORDER — FLUTICASONE FUROATE-VILANTEROL 200-25 MCG/INH IN AEPB
1.0000 | INHALATION_SPRAY | Freq: Every day | RESPIRATORY_TRACT | Status: DC
  Administered 2020-01-25 – 2020-01-26 (×2): 1 via RESPIRATORY_TRACT
  Filled 2020-01-24: qty 28

## 2020-01-24 MED ORDER — ENOXAPARIN SODIUM 40 MG/0.4ML ~~LOC~~ SOLN: 40.0000 mg | SUBCUTANEOUS | Status: DC

## 2020-01-24 MED ORDER — SULFAMETHOXAZOLE-TRIMETHOPRIM 400-80 MG PO TABS: 1.0000 | ORAL_TABLET | ORAL | Status: DC

## 2020-01-24 MED ORDER — CYCLOBENZAPRINE HCL 10 MG PO TABS
10.0000 mg | ORAL_TABLET | Freq: Once | ORAL | Status: DC
  Administered 2020-01-24: 10 mg via ORAL
  Filled 2020-01-24: qty 1

## 2020-01-24 MED ORDER — PREDNISONE 20 MG PO TABS
20.0000 mg | ORAL_TABLET | Freq: Every day | ORAL | Status: DC
  Administered 2020-01-25 – 2020-01-26 (×2): 20 mg via ORAL
  Filled 2020-01-24 (×2): qty 1

## 2020-01-24 MED ORDER — DIAZEPAM 5 MG PO TABS
5.0000 mg | ORAL_TABLET | Freq: Once | ORAL | Status: AC
  Administered 2020-01-24: 18:00:00 5 mg via ORAL
  Filled 2020-01-24: qty 1

## 2020-01-24 MED ORDER — WARFARIN SODIUM 2 MG PO TABS
2.0000 mg | ORAL_TABLET | Freq: Once | ORAL | Status: AC
  Administered 2020-01-24: 2 mg via ORAL
  Filled 2020-01-24: qty 1

## 2020-01-24 MED ORDER — PROMETHAZINE HCL 25 MG PO TABS: 12.5000 mg | ORAL_TABLET | Freq: Four times a day (QID) | ORAL | Status: DC | PRN

## 2020-01-24 MED ORDER — SENNOSIDES-DOCUSATE SODIUM 8.6-50 MG PO TABS
1.0000 | ORAL_TABLET | Freq: Every evening | ORAL | Status: DC | PRN
  Administered 2020-01-25: 1 via ORAL
  Filled 2020-01-24: qty 1

## 2020-01-24 MED ORDER — NAPROXEN 250 MG PO TABS: 500.0000 mg | ORAL_TABLET | Freq: Two times a day (BID) | ORAL | Status: DC

## 2020-01-24 MED ORDER — HYDROCODONE-ACETAMINOPHEN 5-325 MG PO TABS
1.0000 | ORAL_TABLET | Freq: Once | ORAL | Status: DC
  Administered 2020-01-24: 1 via ORAL
  Filled 2020-01-24: qty 1

## 2020-01-24 MED ORDER — WARFARIN - PHARMACIST DOSING INPATIENT: Freq: Every day | Status: DC

## 2020-01-24 MED ORDER — NAPROXEN 250 MG PO TABS
500.0000 mg | ORAL_TABLET | Freq: Two times a day (BID) | ORAL | Status: DC
  Administered 2020-01-24 – 2020-01-26 (×5): 500 mg via ORAL
  Filled 2020-01-24 (×5): qty 2

## 2020-01-24 MED ORDER — PANTOPRAZOLE SODIUM 40 MG PO TBEC
80.0000 mg | DELAYED_RELEASE_TABLET | Freq: Every day | ORAL | Status: DC
  Administered 2020-01-24 – 2020-01-26 (×3): 80 mg via ORAL
  Filled 2020-01-24 (×3): qty 2

## 2020-01-24 MED ORDER — ACETAMINOPHEN 650 MG RE SUPP: 650.0000 mg | Freq: Four times a day (QID) | RECTAL | Status: DC | PRN

## 2020-01-24 MED ORDER — DIAZEPAM 5 MG PO TABS: 10.0000 mg | ORAL_TABLET | Freq: Once | ORAL | Status: DC

## 2020-01-24 MED ORDER — ACETAMINOPHEN 325 MG PO TABS
650.0000 mg | ORAL_TABLET | Freq: Four times a day (QID) | ORAL | Status: DC | PRN
  Administered 2020-01-26: 650 mg via ORAL
  Filled 2020-01-24: qty 2

## 2020-01-24 NOTE — ED Notes (Signed)
Pt placed on 2L of O2. Pt's O2 87%-89%.

## 2020-01-24 NOTE — Progress Notes (Signed)
RT just notified about pending ABG from previous shift. Will collect at this time.

## 2020-01-24 NOTE — Progress Notes (Signed)
ANTICOAGULATION CONSULT NOTE - Initial Consult  Pharmacy Consult for Warfarin Indication: Hx of VTE   No Known Allergies  Patient Measurements: Height: _0  (175.3 cm) Weight: 228 lb 9.9 oz (103.7 kg) IBW/kg (Calculated) : 70.7 Heparin Dosing Weight:   Vital Signs: Temp: 98.1 F (36.7 C) (01/31 2055) Temp Source: Oral (01/31 2055) BP: 136/91 (01/31 2055) Pulse Rate: 103 (01/31 2055)  Labs: Recent Labs    01/24/20 1615 01/24/20 1615 01/24/20 1738 01/24/20 1948 01/24/20 2021  HGB 16.7   < >  --  16.7 15.8  HCT 52.5*  --   --  49.0 48.9  PLT 215  --   --   --  210  LABPROT 29.1*  --   --   --   --   INR 2.8*  --   --   --   --   CREATININE 1.03  --   --   --  0.84  TROPONINIHS 9  --  10  --   --    < > = values in this interval not displayed.    Estimated Creatinine Clearance: 102.7 mL/min (by C-G formula based on SCr of 0.84 mg/dL).   Medical History: Past Medical History:  Diagnosis Date  . DVT of lower extremity, bilateral ([place])   . Hypertension   . Incarcerated umbilical hernia 86/3817   Status post repair  . Lupus anticoagulant positive    Per records from [facility]  . 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 Pulmonary artery hypertension ([place]) 04/2011   Mildly elevated pulmonary artery pressure in setting of PE (from [facility])  . Pulmonary embolism ([place]) 04/2011  . Sarcoidosis 1989  . Subdural hematoma (HCC)    Remote episode, occurred when taking excedrin and warfarin.     Medications:  Scheduled:  . fluticasone furoate-vilanterol  1 puff Inhalation Daily  . [START ON 01/25/2020] naproxen  500 mg Oral BID WC  . pantoprazole  80 mg Oral Daily  . [START ON 01/25/2020] predniSONE  20 mg Oral Q breakfast  . warfarin  2 mg Oral ONCE-1800  . [START ON 01/25/2020] Warfarin - Pharmacist Dosing Inpatient   Does not apply q1800    Assessment: Patient is a 29 yom with sarcoidosis, hx of DVT  ( on warfarin) , HTN, and compression fx on T6. The patient patient presented and is being admitted for back pain. Pharmacy has been asked to continue dosing his warfarin at this time.  PTA dosing: Warfarin 16m MWF and 214mROW   Goal of Therapy:  INR 2-3 Monitor platelets by anticoagulation protocol: Yes   Plan:  -INR therapeutic at 2.8  - Will give Warfarin 91m69mO x 1 dose tonight  - Daily PT-INR  - Monitor patient for s/s of bleeding and cbc  JasDuanne LimerickarmD. BCPS  01/24/2020,9:24 PM

## 2020-01-24 NOTE — ED Triage Notes (Signed)
C/o back pain since December.  States he had a MRI in [place] and he had compression fractures on T6 and T8.  Reports increased back spasms x 3 days.  Pt states he tested COVID + 2 1/2 weeks ago but was asymptomatic.

## 2020-01-24 NOTE — ED Provider Notes (Signed)
[facility] EMERGENCY DEPARTMENT Provider Note   CSN: [id number] Arrival date & time: 01/24/20  1334     History Chief Complaint  Patient presents with  . Back Pain    Jesus Russell is a 69 y.o. male past medical history significant for bilateral DVTs, pulmonary artery hypertension, sarcoidosis, PE presenting to emergency department today with chief complaint of back numbness x3 days.  He states that been intermittent.  He states he is been unable to sleep for 3 days because of the spasms.  He has taken his usual medications but nothing in addition to try to help the spasms.  He describes spasms as located in the middle of his back and a radiate throughout his entire back.  He denies the pain radiating to his groin. He also has history of compression fracture of T6 and T8.  He saw his PCP on 01/20/2020 with plan for obtaining records from [place] for the MRI he has had of his back and likely referral to IR for possible kyphoplasty.  Patient tested positive for Covid on 01/06/2020. He is on warfarin supposed to get his INR checked tomorrow, denies missing any doses but states his level has been out of whack because of the multiple medications he is on.Marland Kitchen  He is also endorsing bilateral lower extremity edema.  He states his legs swell from time to time and it appears worse over the last 3 days. He does not wear oxygen at home.  Also denies fever, chills, chest pain, shortness of breath, abdominal pain, nausea, gross hematuria, urinary frequency, diarrhea.    Past Medical History:  Diagnosis Date  . DVT of lower extremity, bilateral ([place])   . Hypertension   . Incarcerated umbilical hernia 01/4096   Status post repair  . Lupus anticoagulant positive    Per records from [facility]  . 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 Pulmonary artery hypertension ([place]) 04/2011   Mildly elevated pulmonary artery pressure in  setting of PE (from [facility])  . Pulmonary embolism ([place]) 04/2011  . Sarcoidosis 1989  . Subdural hematoma (H[facility])    Remote episode, occurred when taking excedrin and warfarin.     Patient Active Problem List   Diagnosis Date Noted  . Compression fracture of T6 vertebra ([place]) 01/20/2020  . Cubital tunnel syndrome on right 01/20/2020  . Osteoporosis 01/20/2020  . Hepatic steatosis 10/29/2019  . Cholelithiasis 10/29/2019  . Obstructive sleep apnea 10/29/2019  . Need for pneumococcal vaccine 10/29/2019  . Hypertension 10/06/2019  . Colon cancer screening 10/06/2019  . Long term (current) use of anticoagulants 09/14/2019  . History of DVT (deep vein thrombosis) 09/14/2019  . History of pulmonary embolism 09/14/2019  . Pulmonary embolism on long-term anticoagulation therapy ([place]) 09/09/2019  . Sarcoidosis 09/09/2019    Past Surgical History:  Procedure Laterality Date  . BRAIN SURGERY    . HERNIA REPAIR         Family History  Adopted: Yes    Social History   Tobacco Use  . Smoking status: Former Smoker    Packs/day: 1.50    Years: 23.00    Pack years: 34.50    Types: Cigarettes    Quit date: 1995    Years since quitting: 26.1  . Smokeless tobacco: Never Used  Substance Use Topics  . Alcohol use: Never  . Drug use: Not Currently    Types: "Crack"  cocaine    Comment: quit 2008    Home Medications Prior to Admission medications   Medication Sig Start Date End Date Taking? Authorizing Provider  Adair Patter 200-25 MCG/INH AEPB INHALE 1 PUFF INTO THE LUNGS DAILY 11/30/19   Jeanmarie Hubert, MD  cholecalciferol (VITAMIN D3) 25 MCG (1000 UT) tablet Take 1,000 Units by mouth daily.    [provider]  losartan-hydrochlorothiazide (HYZAAR) 50-12.5 MG tablet Take 1 tablet by mouth daily. 12/16/19   Jeanmarie Hubert, MD  Na Sulfate-K Sulfate-Mg Sulf (SUPREP BOWEL PREP KIT) 17.5-3.13-1.6 GM/177ML SOLN Take 1 kit by mouth as directed. Patient not taking:  Reported on 12/14/2019 12/01/19   Thornton Park, MD  omeprazole (PRILOSEC) 40 MG capsule Take 1 capsule (40 mg total) by mouth daily. Patient not taking: Reported on 12/14/2019 11/09/19   Spero Geralds, MD  predniSONE (DELTASONE) 10 MG tablet Take 30 mg for 2 months, then 20 mg for 2 months, then 10 mg for 2 months Patient not taking: Reported on 12/14/2019 11/18/19   Jeanmarie Hubert, MD  sulfamethoxazole-trimethoprim (BACTRIM) 400-80 MG tablet Take 1 tablet by mouth 3 (three) times a week. Patient not taking: Reported on 12/14/2019 11/09/19   Spero Geralds, MD  warfarin (COUMADIN) 4 MG tablet TAKE 1/2 OF YOUR 4 MG WARFARIN TABLET ON SUNDAYS, TUESDAYS, THURSDAYS AND SATURDAYS. ALL OTHER DAYS TAKE 1 TABLET 11/26/19   Jorene Guest B, RPH-CPP  zinc gluconate 50 MG tablet Take 50 mg by mouth daily.    [provider]    Allergies    Patient has no known allergies.  Review of Systems   Review of Systems All other systems are reviewed and are negative for acute change except as noted in the HPI.  Physical Exam Updated Vital Signs BP (!) 144/102 (BP Location: Right Arm)   Pulse (!) 105   Temp (!) 97.3 F (36.3 C) (Oral)   Resp (!) 22   SpO2 93%   Physical Exam Vitals and nursing note reviewed.  Constitutional:      General: He is not in acute distress.    Appearance: He is not ill-appearing.  HENT:     Head: Normocephalic and atraumatic.     Right Ear: Tympanic membrane and external ear normal.     Left Ear: Tympanic membrane and external ear normal.     Nose: Nose normal.     Mouth/Throat:     Mouth: Mucous membranes are moist.     Pharynx: Oropharynx is clear.  Eyes:     General: No scleral icterus.       Right eye: No discharge.        Left eye: No discharge.     Extraocular Movements: Extraocular movements intact.     Conjunctiva/sclera: Conjunctivae normal.     Pupils: Pupils are equal, round, and reactive to light.  Neck:     Vascular: No JVD.    Cardiovascular:     Rate and Rhythm: Regular rhythm. Tachycardia present.     Pulses: Normal pulses.          Radial pulses are 2+ on the right side and 2+ on the left side.     Heart sounds: Normal heart sounds.  Pulmonary:     Comments: Hypoxic to 88% on room air, improved with 3L nasal cannula. Diffuse wheezing heard in all lung fields. He speaks in short sentences. Symmetric chest rise. No rales, or rhonchi. Abdominal:     Comments: Abdomen is soft, non-distended, and  non-tender in all quadrants. No rigidity, no guarding. No peritoneal signs.  Musculoskeletal:        General: Normal range of motion.     Cervical back: Normal range of motion.       Back:     Right lower leg: Edema present.     Left lower leg: Edema present.     Comments: Tenderness to palpation as depicted in image above. No overlying skin change. No midline tenderness.  Bilateral lower extremity edema unchanged per patient. No calf tenderness, compartments soft.    Skin:    General: Skin is warm and dry.     Capillary Refill: Capillary refill takes less than 2 seconds.  Neurological:     Mental Status: He is oriented to person, place, and time.     GCS: GCS eye subscore is 4. GCS verbal subscore is 5. GCS motor subscore is 6.     Comments: Fluent speech, no facial droop.  Psychiatric:        Behavior: Behavior normal.       ED Results / Procedures / Treatments   Labs (all labs ordered are listed, but only abnormal results are displayed) Labs Reviewed  CBC WITH DIFFERENTIAL/PLATELET - Abnormal; Notable for the following components:      Result Value   WBC 14.2 (*)    HCT 52.5 (*)    MCV 100.4 (*)    RDW 15.6 (*)    Neutro Abs 11.3 (*)    Monocytes Absolute 1.1 (*)    Abs Immature Granulocytes 0.11 (*)    All other components within normal limits  LACTATE DEHYDROGENASE - Abnormal; Notable for the following components:   LDH 232 (*)    All other components within normal limits  FIBRINOGEN -  Abnormal; Notable for the following components:   Fibrinogen 483 (*)    All other components within normal limits  C-REACTIVE PROTEIN - Abnormal; Notable for the following components:   CRP 1.2 (*)    All other components within normal limits  PROTIME-INR - Abnormal; Notable for the following components:   Prothrombin Time 29.1 (*)    INR 2.8 (*)    All other components within normal limits  CULTURE, BLOOD (ROUTINE X 2)  CULTURE, BLOOD (ROUTINE X 2)  LACTIC ACID, PLASMA  COMPREHENSIVE METABOLIC PANEL  D-DIMER, QUANTITATIVE (NOT AT [facility])  PROCALCITONIN  FERRITIN  BRAIN NATRIURETIC PEPTIDE  TRIGLYCERIDES  BLOOD GAS, ARTERIAL  HIV ANTIBODY (ROUTINE TESTING W REFLEX)  CBC  CREATININE, SERUM  BASIC METABOLIC PANEL  CBC  TROPONIN I (HIGH SENSITIVITY)  TROPONIN I (HIGH SENSITIVITY)    EKG EKG Interpretation  Date/Time:  Sunday January 24 2020 16:03:44 EST Ventricular Rate:  103 PR Interval:    QRS Duration: 133 QT Interval:  348 QTC Calculation: 456 R Axis:   -49 Text Interpretation: Sinus tachycardia RBBB and LAFB Borderline ST elevation, lateral leads no prior for comparison Confirmed by Madalyn Rob [phone]) on 01/24/2020 5:19:05 PM   Radiology DG Chest Port 1 View  Result Date: 01/24/2020 CLINICAL DATA:  Hypoxia, recent COVID-19 infection EXAM: PORTABLE CHEST 1 VIEW COMPARISON:  10/06/2019 FINDINGS: Patient is rotated. Grossly stable cardiomediastinal contours. Extensive coarsened interstitial markings throughout both lungs with areas of pleuroparenchymal scarring and nodularity. No definite new airspace consolidation compared to prior. No large pleural fluid collection. No pneumothorax. 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 No definite new airspace consolidation. Electronically Signed  By: Davina Poke D.O.   On: 01/24/2020 16:37    Procedures .Critical  Care Performed by: Cherre Robins, PA-C Authorized by: Cherre Robins, PA-C   Critical care provider statement:    Critical care time (minutes):  36   Critical care time was exclusive of:  Separately billable procedures and treating other patients and teaching time   Critical care was necessary to treat or prevent imminent or life-threatening deterioration of the following conditions: acute respiratory failure with hypoxia due to covid infection.   Critical care was time spent personally by me on the following activities:  Development of treatment plan with patient or surrogate, blood draw for specimens, discussions with consultants, evaluation of patient's response to treatment, examination of patient, obtaining history from patient or surrogate, ordering and performing treatments and interventions, ordering and review of laboratory studies, ordering and review of radiographic studies, pulse oximetry, re-evaluation of patient's condition and review of old charts   (including critical care time)  Medications Ordered in ED Medications - No data to display  ED Course  I have reviewed the triage vital signs and the nursing notes.  Pertinent labs & imaging results that were available during my care of the patient were reviewed by me and considered in my medical decision making (see chart for details).    MDM Rules/Calculators/A&P                       Patient seen and examined.  He is afebrile, tachycardic to 105 and hypoxic to 88% on room air, improved to 95% on 3L nasal cannula. On exam he has diffuse wheezing heard in all lung fields, he appears to be in mild respiratory distress. He has bilateral lower extremity edea, no calf tenderness.  He was covid positive on 01/06/2020 . EKG without stemi. With his recent covid positive test and new hypoxia today, covid admission labs ordered.   CBC with leukocytosis 14.1, patient taking steroids currently,likley related. No anemia. INR  within therapeutic range for warfarin use. Ferritin, procalcitonin, d-dimer all normal. Elevations of CRP, LDH, fibrinogen. Lactic acid is within normal range.  BNP normal at 67.4. Delta troponin flat. Chest xray viewed and compared to previous. No obvious acute infectious processes. Attempted pain control with norco, flexeril, valium. On reassessment still with spasms, not much improvement in pain. He looks to be in mild distress, whether it be from his increased work of breathing or pain from back spasms. This case was discussed with Dr. Roslynn Amble who has seen the patient and agrees with plan to admit. Spoke with IM service who agrees to assume care of patient and bring into the hospital for further evaluation and management of hypoxia and pain/spasm control. Appreciate admission.  Mindy Behnken was evaluated in Emergency Department on 01/24/2020 for the symptoms described in the history of present illness. He was evaluated in the context of the global COVID-19 pandemic, which necessitated consideration that the patient might be at risk for infection with the SARS-CoV-2 virus that causes COVID-19. Institutional protocols and algorithms that pertain to the evaluation of patients at risk for COVID-19 are in a state of rapid change based on information released by regulatory bodies including the CDC and federal and state organizations. These policies and algorithms were followed during the patient's care in the ED.  Portions of this note were generated with [employer]. Dictation errors may occur despite best attempts at proofreading.   Final Clinical Impression(s) / ED Diagnoses  Final diagnoses:  Muscle spasm of back  Acute respiratory failure with hypoxia [facility])    Rx / DC Orders ED Discharge Orders    None       Flint Melter 01/25/20 5830    Lucrezia Starch, MD 01/27/20 [phone]

## 2020-01-24 NOTE — H&P (Signed)
Date: 01/24/2020               Patient Name:  Jesus Russell MRN: [id number]  DOB: 11/14/1951 Age / Sex: 69 y.o., male   PCP: Jeanmarie Hubert, MD         Medical Service: Internal Medicine Teaching Service         Attending Physician: Dr. Evette Doffing, Mallie Mussel, *    First Contact: Dr. Catalina Antigua Pager: [phone]  Second Contact: Dr. Maricela Bo Pager: [phone]       After Hours (After 5p/  First Contact Pager: [phone]  weekends / holidays): Second Contact Pager: [phone]   Chief Complaint: Backpain   History of Present Illness:  Jesus Russell 69 y.o male with sarcoidosis, hx of dvt, htn, osa, compression fracture of T6 vertebra who presented with 3 day history of back pain and dyspnea. Patient's dyspnea is worsened with back pain. The back pain is present over lumbar and thoracic spine, spasm like in nature occurring every 5 minutes, radiates to abdomen. He has not had any bowel movements or flatus in past 3-4 days.  Denies nausea vomiting, fever/chills, chest pain, headaches, dizziness, recent falls.   In ED, the patient the patient was normotensive, afebrile, mildly tachycardic ranging 90-110, and tachypneic   Meds:  Current Meds  Medication Sig  . BREO ELLIPTA 200-25 MCG/INH AEPB INHALE 1 PUFF INTO THE LUNGS DAILY (Patient taking differently: Inhale 1 puff into the lungs daily. )  . cholecalciferol (VITAMIN D3) 25 MCG (1000 UT) tablet Take 1,000 Units by mouth daily.  Marland Kitchen losartan-hydrochlorothiazide (HYZAAR) 50-12.5 MG tablet Take 1 tablet by mouth daily.  Marland Kitchen omeprazole (PRILOSEC) 40 MG capsule Take 1 capsule (40 mg total) by mouth daily.  Marland Kitchen warfarin (COUMADIN) 4 MG tablet TAKE 1/2 OF YOUR 4 MG WARFARIN TABLET ON SUNDAYS, TUESDAYS, THURSDAYS AND SATURDAYS. ALL OTHER DAYS TAKE 1 TABLET (Patient taking differently: Take 2-4 mg by mouth See admin instructions. TAKE 1/2 OF YOUR 4 MG WARFARIN TABLET ON SUNDAYS, TUESDAYS, THURSDAYS AND SATURDAYS. ALL OTHER DAYS TAKE 1 (4MG) TABLET)  . zinc  gluconate 50 MG tablet Take 50 mg by mouth daily.     Allergies: Allergies as of 01/24/2020  . (No Known Allergies)   Past Medical History:  Diagnosis Date  . DVT of lower extremity, bilateral ([place])   . Hypertension   . Incarcerated umbilical hernia 09/2724   Status post repair  . Lupus anticoagulant positive    Per records from [facility]  . 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 Pulmonary artery hypertension ([place]) 04/2011   Mildly elevated pulmonary artery pressure in setting of PE (from [facility])  . Pulmonary embolism ([place]) 04/2011  . Sarcoidosis 1989  . Subdural hematoma ([facility])    Remote episode, occurred when taking excedrin and warfarin.     Family History:   Family History  Adopted: Yes    Social History:   Social History   Socioeconomic History  . Marital status: Divorced    Spouse name: Not on file  . Number of children: Not on file  . Years of education: Not on file  . Highest education level: Not on file  Occupational History  . Occupation: retired Pharmacist, hospital  Tobacco Use  . Smoking status: Former Smoker    Packs/day: 1.50    Years: 23.00    Pack years: 34.50    Types: Cigarettes  Quit date: 79    Years since quitting: 26.1  . Smokeless tobacco: Never Used  Substance and Sexual Activity  . Alcohol use: Never  . Drug use: Not Currently    Types: "Crack" cocaine    Comment: quit 2008  . Sexual activity: Not on file  Other Topics Concern  . Not on file  Social History Narrative  . Not on file   Social Determinants of Health   Financial Resource Strain: Low Risk   . Difficulty of Paying Living Expenses: Not very hard  Food Insecurity:   . Worried About Charity fundraiser in the Last Year: Not on file  . Ran Out of Food in the Last Year: Not on file  Transportation Needs:   . Lack of Transportation (Medical): Not on file  . Lack of Transportation (Non-Medical): Not on  file  Physical Activity:   . Days of Exercise per Week: Not on file  . Minutes of Exercise per Session: Not on file  Stress:   . Feeling of Stress : Not on file  Social Connections:   . Frequency of Communication with Friends and Family: Not on file  . Frequency of Social Gatherings with Friends and Family: Not on file  . Attends Religious Services: Not on file  . Active Member of Clubs or Organizations: Not on file  . Attends Archivist Meetings: Not on file  . Marital Status: Not on file     Review of Systems: A complete ROS was negative except as per HPI.   Physical Exam: Blood pressure 130/86, pulse 95, temperature (!) 97.3 F (36.3 C), temperature source Oral, resp. rate (!) 21, SpO2 97 %.  Physical Exam  Constitutional: He is oriented to person, place, and time. He appears well-developed and well-nourished. No distress.  HENT:  Head: Normocephalic and atraumatic.  Eyes: Conjunctivae are normal.  Cardiovascular: Regular rhythm and normal heart sounds. Tachycardia present.  Respiratory: Effort normal. No respiratory distress. He has wheezes (diffuse in all lung fields).  GI: Soft. Bowel sounds are normal. He exhibits no distension. There is no abdominal tenderness.  Musculoskeletal:        General: No edema.  Neurological: He is alert and oriented to person, place, and time.  Skin: He is not diaphoretic. No erythema.  Psychiatric: He has a normal mood and affect. His behavior is normal. Judgment and thought content normal.    EKG: personally reviewed my interpretation is sinus tachycardia without ischemic changes.   CXR: personally reviewed my interpretation is areas of interstitial fibrosis without consolidation  Assessment & Plan by Problem: Active Problems:   Acute respiratory failure with hypoxia [facility])  Jesus Russell is a 69 y.o m with sarcoidosis, hx of dvt, osa, compression fracture of t6 vertebra who worsened with 3 day history of back pain and dyspnea.     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 D-dimer within normal range with low wells score.  Hemoglobin stable at 16. BNP 67.  Troponin negative. Patient is afebrile and is likely not infectious. WBC elevation likely secondary to steroid use. Afebrile. Blood cultures ordered by ED provider. BNP 67, no significant peripheral edema. Chest xray shows areas of interstitial opacity, no new areas of consolidation.   Patient was seen by [facility] pulmonology on 11/09/19. He was noted to have significant pulmonary scarring. Was started on bactrim, daily ppi, and  told to continue prednisone 52m qd. Patient was to be started on tnf alpha infusions vs azothioprine vs methotrexate based on pft results.  Last pfts 04/02/2018 showed moderate restrictive ventilatory defect prior to bronchodilator with improvement after bronchodilator.   -patient to follow up with pulmonology outpatient  -continue prednisone 25mqd  -continue pantoprazole 8022md -continue breo qd  -ambulatory pulse ox to assess for home health oxygen needs  -abg pending   Back pain  Back pain is likely due to history of compression fracture of T6 vertebrae as [facility] [place] [facility]. Compression is likely due to chronic steroid use. Inflammatory marker elevation (LDH 232, fibrinogen 483, crp 1.2). No known redflag symptoms of anal analgesia, fecal incontinence, significant change in pain level.   -attempt to get MRI records -has been referred to IR for kyphoplasty  -Naproxen 500m65md  Hx of DVT/PE Lupus ag positive in 2015  OSA  -cpap qhs   Dispo: Admit patient to Observation with expected length of stay less than 2 midnights.  Signed: ChLars Mage 01/24/2020, 8:18 PM  Pager: [phone]

## 2020-01-24 NOTE — ED Notes (Signed)
Called RT to follow up on ABG

## 2020-01-25 ENCOUNTER — Inpatient Hospital Stay (HOSPITAL_COMMUNITY): Payer: Medicare Other

## 2020-01-25 ENCOUNTER — Ambulatory Visit: Payer: Medicare Other

## 2020-01-25 DIAGNOSIS — Z87891 Personal history of nicotine dependence: Secondary | ICD-10-CM | POA: Diagnosis not present

## 2020-01-25 DIAGNOSIS — M5136 Other intervertebral disc degeneration, lumbar region: Secondary | ICD-10-CM | POA: Diagnosis present

## 2020-01-25 DIAGNOSIS — M549 Dorsalgia, unspecified: Secondary | ICD-10-CM | POA: Diagnosis present

## 2020-01-25 DIAGNOSIS — M545 Low back pain: Secondary | ICD-10-CM | POA: Diagnosis not present

## 2020-01-25 DIAGNOSIS — D869 Sarcoidosis, unspecified: Secondary | ICD-10-CM | POA: Diagnosis not present

## 2020-01-25 DIAGNOSIS — M4854XA Collapsed vertebra, not elsewhere classified, thoracic region, initial encounter for fracture: Secondary | ICD-10-CM | POA: Diagnosis present

## 2020-01-25 DIAGNOSIS — Z7901 Long term (current) use of anticoagulants: Secondary | ICD-10-CM

## 2020-01-25 DIAGNOSIS — G4733 Obstructive sleep apnea (adult) (pediatric): Secondary | ICD-10-CM | POA: Diagnosis present

## 2020-01-25 DIAGNOSIS — M810 Age-related osteoporosis without current pathological fracture: Secondary | ICD-10-CM | POA: Diagnosis present

## 2020-01-25 DIAGNOSIS — Z8679 Personal history of other diseases of the circulatory system: Secondary | ICD-10-CM | POA: Diagnosis not present

## 2020-01-25 DIAGNOSIS — M8448XA Pathological fracture, other site, initial encounter for fracture: Secondary | ICD-10-CM

## 2020-01-25 DIAGNOSIS — R0902 Hypoxemia: Secondary | ICD-10-CM | POA: Diagnosis present

## 2020-01-25 DIAGNOSIS — Z86718 Personal history of other venous thrombosis and embolism: Secondary | ICD-10-CM | POA: Diagnosis not present

## 2020-01-25 DIAGNOSIS — Z8616 Personal history of COVID-19: Secondary | ICD-10-CM | POA: Diagnosis not present

## 2020-01-25 DIAGNOSIS — M48061 Spinal stenosis, lumbar region without neurogenic claudication: Secondary | ICD-10-CM | POA: Diagnosis present

## 2020-01-25 DIAGNOSIS — D6862 Lupus anticoagulant syndrome: Secondary | ICD-10-CM | POA: Diagnosis present

## 2020-01-25 DIAGNOSIS — J9621 Acute and chronic respiratory failure with hypoxia: Secondary | ICD-10-CM | POA: Diagnosis present

## 2020-01-25 DIAGNOSIS — D860 Sarcoidosis of lung: Secondary | ICD-10-CM | POA: Diagnosis present

## 2020-01-25 DIAGNOSIS — Z79899 Other long term (current) drug therapy: Secondary | ICD-10-CM | POA: Diagnosis not present

## 2020-01-25 DIAGNOSIS — Z720 Tobacco use: Secondary | ICD-10-CM

## 2020-01-25 DIAGNOSIS — Z86711 Personal history of pulmonary embolism: Secondary | ICD-10-CM | POA: Diagnosis not present

## 2020-01-25 DIAGNOSIS — J8410 Pulmonary fibrosis, unspecified: Secondary | ICD-10-CM | POA: Diagnosis present

## 2020-01-25 DIAGNOSIS — M62830 Muscle spasm of back: Secondary | ICD-10-CM | POA: Diagnosis present

## 2020-01-25 DIAGNOSIS — Z7951 Long term (current) use of inhaled steroids: Secondary | ICD-10-CM | POA: Diagnosis not present

## 2020-01-25 DIAGNOSIS — I10 Essential (primary) hypertension: Secondary | ICD-10-CM | POA: Diagnosis present

## 2020-01-25 LAB — BASIC METABOLIC PANEL
Anion gap: 10 (ref 5–15)
BUN: 21 mg/dL (ref 8–23)
CO2: 29 mmol/L (ref 22–32)
Calcium: 9.1 mg/dL (ref 8.9–10.3)
Chloride: 100 mmol/L (ref 98–111)
Creatinine, Ser: 0.91 mg/dL (ref 0.61–1.24)
GFR calc Af Amer: >60 mL/min (ref >60)
GFR calc non Af Amer: >60 mL/min (ref >60)
Glucose, Bld: 82 mg/dL (ref 70–99)
Potassium: 4 mmol/L (ref 3.5–5.1)
Sodium: 139 mmol/L (ref 135–145)

## 2020-01-25 LAB — CBC
HCT: 46.5 % (ref 39.0–52.0)
Hemoglobin: 15 g/dL (ref 13.0–17.0)
MCH: 32.3 pg (ref 26.0–34.0)
MCHC: 32.3 g/dL (ref 30.0–36.0)
MCV: 100.2 fL — ABNORMAL HIGH (ref 80.0–100.0)
Platelets: 205 10*3/uL (ref 150–400)
RBC: 4.64 MIL/uL (ref 4.22–5.81)
RDW: 15.8 % — ABNORMAL HIGH (ref 11.5–15.5)
WBC: 12.8 10*3/uL — ABNORMAL HIGH (ref 4.0–10.5)
nRBC: 0 % (ref 0.0–0.2)

## 2020-01-25 LAB — PROTIME-INR
INR: 2.9 — ABNORMAL HIGH (ref 0.8–1.2)
Prothrombin Time: 30.1 seconds — ABNORMAL HIGH (ref 11.4–15.2)

## 2020-01-25 MED ORDER — CYCLOBENZAPRINE HCL 10 MG PO TABS
5.0000 mg | ORAL_TABLET | Freq: Once | ORAL | Status: AC
  Administered 2020-01-26: 5 mg via ORAL

## 2020-01-25 MED ORDER — CYCLOBENZAPRINE HCL 10 MG PO TABS
5.0000 mg | ORAL_TABLET | Freq: Once | ORAL | Status: AC
  Administered 2020-01-25: 5 mg via ORAL
  Filled 2020-01-25: qty 1

## 2020-01-25 MED ORDER — CYCLOBENZAPRINE HCL 10 MG PO TABS
5.0000 mg | ORAL_TABLET | Freq: Once | ORAL | Status: DC
  Filled 2020-01-25: qty 1

## 2020-01-25 MED ORDER — WARFARIN SODIUM 2 MG PO TABS
2.0000 mg | ORAL_TABLET | Freq: Once | ORAL | Status: AC
  Administered 2020-01-25: 2 mg via ORAL
  Filled 2020-01-25: qty 1

## 2020-01-25 NOTE — Progress Notes (Signed)
Pt's last COVID test was done 01/06/2020. It was a positive test.  Pt's was not retested tonight.  CPAP is not appropiate due to not having a recent negative COVID test.

## 2020-01-25 NOTE — Progress Notes (Signed)
Subjective: HD#1  Paitient stated that he initially had back pain once or twice daily but it has increased in frequency. He is able to walk although it makes it worse.   Objective:  Vital signs in last 24 hours: Vitals:   01/24/20 1700 01/24/20 2055 01/25/20 0625 01/25/20 1000  BP: 130/86 (!) 136/91  132/88  Pulse: 95 (!) 103    Resp: (!) 21 (!) 22    Temp:  98.1 F (36.7 C)  98 F (36.7 C)  TempSrc:  Oral  Oral  SpO2: 97% 100%  100%  Weight:  103.7 kg 103.1 kg   Height:  _0  (1.753 m)      Physical Exam: Physical Exam  Constitutional: He is oriented to person, place, and time and well-developed, well-nourished, and in no distress.  HENT:  Head: Normocephalic and atraumatic.  Eyes: EOM are normal.  Cardiovascular: Normal rate, normal heart sounds and intact distal pulses. Exam reveals no gallop and no friction rub.  No murmur heard. Pulmonary/Chest: Effort normal. He has wheezes.  Abdominal: Soft. There is no abdominal tenderness.  Musculoskeletal:        General: Edema present. No tenderness. Normal range of motion.     Cervical back: Normal range of motion.  Neurological: He is alert and oriented to person, place, and time.  Skin: Skin is warm and dry.    Filed Weights   01/24/20 2055 01/25/20 0625  Weight: 103.7 kg 103.1 kg     Intake/Output Summary (Last 24 hours) at 01/25/2020 1239 Last data filed at 01/25/2020 1000 Gross per 24 hour  Intake 360 ml  Output 575 ml  Net -215 ml    Pertinent labs/Imaging: CBC Latest Ref Rng & Units 01/25/2020 01/24/2020 01/24/2020  WBC 4.0 - 10.5 K/uL 12.8(H) 14.4(H) -  Hemoglobin 13.0 - 17.0 g/dL 15.0 15.8 16.7  Hematocrit 39.0 - 52.0 % 46.5 48.9 49.0  Platelets 150 - 400 K/uL 205 210 -    CMP Latest Ref Rng & Units 01/25/2020 01/24/2020 01/24/2020  Glucose 70 - 99 mg/dL 82 - -  BUN 8 - 23 mg/dL 21 - -  Creatinine 0.61 - 1.24 mg/dL 0.91 0.84 -  Sodium 135 - 145 mmol/L 139 - 135  Potassium 3.5 - 5.1 mmol/L 4.0 - 3.8   Chloride 98 - 111 mmol/L 100 - -  CO2 22 - 32 mmol/L 29 - -  Calcium 8.9 - 10.3 mg/dL 9.1 - -  Total Protein 6.5 - 8.1 g/dL - - -  Total Bilirubin 0.3 - 1.2 mg/dL - - -  Alkaline Phos 38 - 126 U/L - - -  AST 15 - 41 U/L - - -  ALT 0 - 44 U/L - - -    DG Chest Port 1 View  Result Date: 01/24/2020 CLINICAL DATA:  Hypoxia, recent COVID-19 infection EXAM: PORTABLE CHEST 1 VIEW COMPARISON:  10/06/2019 FINDINGS: Patient is rotated. Grossly stable cardiomediastinal contours. Extensive coarsened interstitial markings throughout both lungs with areas of pleuroparenchymal scarring and nodularity. No definite new airspace consolidation compared to prior. No large pleural fluid collection. No pneumothorax. 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 No definite new airspace consolidation. Electronically Signed   By: Davina Poke D.O.   On: 01/24/2020 16:37    Assessment/Plan:  Active Problems:   Acute respiratory failure with hypoxia [facility])    Patient Summary: Jesus Russell is a 69 y.o. with pertinent PMH  of sarcoidosis, history DVT, OSA, compression fractures of T6 vertebra admit for a 3 day history of back pain on hospital day 1  #Acute on chronic respiratory failure #Sarcoidosis stage IV #30 pack year history of tobacco use - Continue supplemental oxygen. Wean as tolerated - PT/OT - Ambulate with spO2 - Continue prednisone 20 mg qd pending MRI  #Back pain - patient states that he is having lower back pain that he describes as a spasm-like pain. The pain is worse with movement. It is relieved with naproxen.  - Continue Naproxen 500 mg bid - MRI lumbar w/o contrast  #Hx/DVT/PE - Patient positive for lupus anticoagulant (2015).   #OSA - CPAP qHS  Diet: heart health IVF: none VTE: lovenox Code: full  Dispo: Anticipated discharge tomorrow.    Marianna Payment, D.O. [facility], PGY-1 Date  01/25/2020 Time 12:39 PM Pager: [phone]

## 2020-01-25 NOTE — Progress Notes (Deleted)
Pt requested Flexeril - received one time dose earlier today _0 . Paged M. Sharlet Salina on call coverage for RX request - received another one time dose for 5 mg.

## 2020-01-25 NOTE — Care Management (Signed)
If patient needs oxygen for home use, he wants a small POC.  This can be filled by [employer].  MD- when writing order check "personal conserving device"  and add in comments "Please provide POC."

## 2020-01-25 NOTE — Care Management (Addendum)
Patient states he is unsure if he wants [facility]. He states that if he can get his spasms in his back under control it probably won't be necessary.  CM will continue to follow to see how he feels after working with PT   16:30 Addendum- patient declines HH, ordered shower seat to be delivered to room, ordered POC from [place].

## 2020-01-25 NOTE — Evaluation (Signed)
Physical Therapy Evaluation Patient Details Name: Jesus Russell MRN: [id number] DOB: [birth year] Today's Date: 01/25/2020   History of Present Illness  Jesus Russell 69 y.o male with sarcoidosis, hx of dvt, htn, osa, compression fracture of T6 vertebra who presented with 3 day history of back pain and dyspnea. Patient's dyspnea is worsened with back pain. The back pain is present over lumbar and thoracic spine, spasm like in nature occurring every 5 minutes, radiates to abdomen. He has not had any bowel movements or flatus in past 3-4 days.  Clinical Impression  Patient presents with mobility limited due to SOB and back pain.  Although seems SOB limiting more, pt reports wasn't that bad until his back pain got bad.  SpO2 on 2L O2 with ambulation dropped to 88% with moderate level dyspnea.  Educated in energy conservation.  Patient refusing HHPT at this time.  Will follow acutely until d/c.     Follow Up Recommendations No PT follow up    Equipment Recommendations  Other (comment)(shower chair)    Recommendations for Other Services       Precautions / Restrictions Precautions Precautions: Fall Precaution Comments: oxygen dependent      Mobility  Bed Mobility Overal bed mobility: Modified Independent             General bed mobility comments: with HOB elevated  Transfers Overall transfer level: Needs assistance Equipment used: None Transfers: Sit to/from Stand Sit to Stand: Supervision         General transfer comment: increased time and with effort due to pain  Ambulation/Gait Ambulation/Gait assistance: Supervision Gait Distance (Feet): 100 Feet Assistive device: None Gait Pattern/deviations: Step-through pattern;Decreased stride length;Wide base of support;Trunk flexed     General Gait Details: R LE more externally rotated than L, flat footed, educated in energy conservation  Stairs            Wheelchair Mobility    Modified Rankin (Stroke Patients Only)        Balance Overall balance assessment: Needs assistance   Sitting balance-Leahy Scale: Good       Standing balance-Leahy Scale: Good                               Pertinent Vitals/Pain Pain Assessment: 0-10 Pain Score: 6  Pain Location: mid to low back with transitions Pain Descriptors / Indicators: Aching;Sore Pain Intervention(s): Monitored during session;Repositioned;Premedicated before session;Heat applied    Home Living Family/patient expects to be discharged to:: Other (Comment)([facility] [employer]) Living Arrangements: Other (Comment)("half way house")   Type of Home: House Home Access: Stairs to enter Entrance Stairs-Rails: Right Entrance Stairs-Number of Steps: 3 Home Layout: Multi-level Home Equipment: None      Prior Function Level of Independence: Independent               Hand Dominance        Extremity/Trunk Assessment   Upper Extremity Assessment Upper Extremity Assessment: RUE deficits/detail RUE Deficits / Details: cubital tunnel syndrome with numbness R pinky and ring finger and some weakness in elbow flex/ext RUE Sensation: decreased light touch    Lower Extremity Assessment Lower Extremity Assessment: Overall WFL for tasks assessed    Cervical / Trunk Assessment Cervical / Trunk Assessment: Kyphotic  Communication   Communication: No difficulties  Cognition Arousal/Alertness: Awake/alert Behavior During Therapy: WFL for tasks assessed/performed Overall Cognitive Status: Within Functional Limits for tasks assessed  General Comments General comments (skin integrity, edema, etc.): Discussed benefits of cane and where to get if needed after d/c as pt refusing now,  Message sent to [facility] for shower chair and pt with questions about [employer] oxygen system.  SpO2 on 2L O2 with ambulation 96%, but dropped to 88% prior to recovery to 92% after ambulation.     Exercises     Assessment/Plan    PT Assessment Patient needs continued PT services  PT Problem List Decreased activity tolerance;Decreased mobility;Pain;Cardiopulmonary status limiting activity;Decreased knowledge of use of DME       PT Treatment Interventions Stair training;Therapeutic activities;Balance training;Therapeutic exercise;Functional mobility training;Gait training;Patient/family education    PT Goals (Current goals can be found in the Care Plan section)  Acute Rehab PT Goals Patient Stated Goal: to return to teaching PT Goal Formulation: With patient Time For Goal Achievement: 02/08/20 Potential to Achieve Goals: Good    Frequency Min 3X/week   Barriers to discharge        Co-evaluation               AM-PAC PT "6 Clicks" Mobility  Outcome Measure Help needed turning from your back to your side while in a flat bed without using bedrails?: None Help needed moving from lying on your back to sitting on the side of a flat bed without using bedrails?: A Little Help needed moving to and from a bed to a chair (including a wheelchair)?: None Help needed standing up from a chair using your arms (e.g., wheelchair or bedside chair)?: None Help needed to walk in hospital room?: None Help needed climbing 3-5 steps with a railing? : A Little 6 Click Score: 22    End of Session Equipment Utilized During Treatment: Oxygen Activity Tolerance: Patient limited by fatigue Patient left: in bed;with call bell/phone within reach   PT Visit Diagnosis: Muscle weakness (generalized) (M62.81);Difficulty in walking, not elsewhere classified (R26.2)    Time: 1470-9295 PT Time Calculation (min) (ACUTE ONLY): 25 min   Charges:   PT Evaluation $PT Eval Moderate Complexity: 1 Mod PT Treatments $Gait Training: 8-22 mins        Magda Kiel, [place] Acute [facility] Services [phone] 01/25/2020   Reginia Naas 01/25/2020, 3:33 PM

## 2020-01-25 NOTE — Progress Notes (Signed)
SATURATION QUALIFICATIONS: (This note is used to comply with regulatory documentation for home oxygen)  Patient Saturations on Room Air at Rest = 96%  Patient Saturations on Room Air while Ambulating = 83%  Patient Saturations on 4 Liters of oxygen while Ambulating = 92%  Please briefly explain why patient needs home oxygen:pt was unable to ambulate greater than 60 feet due to back pain.  Pt quickly desaturated to low 80's and also became tachypneic, able to recover partially with 4 liters oxygen to 92% and quickluy recovered when back in bed and resting to 96%.

## 2020-01-25 NOTE — Progress Notes (Signed)
ANTICOAGULATION CONSULT NOTE - Initial Consult  Pharmacy Consult for Warfarin Indication: Hx of VTE     Patient Measurements: Height: _0  (175.3 cm) Weight: 227 lb 4.7 oz (103.1 kg) IBW/kg (Calculated) : 70.7 Heparin Dosing Weight:   Vital Signs:    Labs: Recent Labs    01/24/20 1615 01/24/20 1615 01/24/20 1738 01/24/20 1948 01/24/20 1948 01/24/20 2021 01/25/20 0328  HGB 16.7   < >  --  16.7   < > 15.8 15.0  HCT 52.5*   < >  --  49.0  --  48.9 46.5  PLT 215  --   --   --   --  210 205  LABPROT 29.1*  --   --   --   --   --  30.1*  INR 2.8*  --   --   --   --   --  2.9*  CREATININE 1.03  --   --   --   --  0.84 0.91  TROPONINIHS 9  --  10  --   --   --   --    < > = values in this interval not displayed.    Estimated Creatinine Clearance: 94.5 mL/min (by C-G formula based on SCr of 0.91 mg/dL).   Medical History: Past Medical History:  Diagnosis Date  . DVT of lower extremity, bilateral ([place])   . Hypertension   . Incarcerated umbilical hernia 70/1410   Status post repair  . Lupus anticoagulant positive    Per records from [facility]  . 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 Pulmonary embolism ([place]) 04/2011  . Sarcoidosis 1989  . Subdural hematoma ([facility])    Remote episode, occurred when taking excedrin and warfarin.     Medications:  Scheduled:  . fluticasone furoate-vilanterol  1 puff Inhalation Daily  . naproxen  500 mg Oral BID WC  . pantoprazole  80 mg Oral Daily  . predniSONE  20 mg Oral Q breakfast  . Warfarin - Pharmacist Dosing Inpatient   Does not apply q1800    Assessment: Patient is a 69 year old male with sarcoidosis, hx of DVT ( on warfarin) , HTN, and compression fx on T6. The patient patient presented and is being admitted for back pain.  Pharmacy has been asked to continue dosing his warfarin at this time. INR is therapeutic at 2.9.  PTA dosing: Warfarin 77m MWF and 2 mg on all other days  Goal of Therapy:  INR 2-3 Monitor platelets by anticoagulation protocol: Yes   Plan:  - Will give Warfarin 2 mg PO x 1 dose tonight  - Daily PT-INR     MHarvel Quale2/12/2019 11:58 AM

## 2020-01-26 DIAGNOSIS — J8410 Pulmonary fibrosis, unspecified: Secondary | ICD-10-CM

## 2020-01-26 DIAGNOSIS — M5136 Other intervertebral disc degeneration, lumbar region: Secondary | ICD-10-CM

## 2020-01-26 DIAGNOSIS — M48061 Spinal stenosis, lumbar region without neurogenic claudication: Secondary | ICD-10-CM

## 2020-01-26 DIAGNOSIS — D860 Sarcoidosis of lung: Principal | ICD-10-CM

## 2020-01-26 LAB — BASIC METABOLIC PANEL
Anion gap: 8 (ref 5–15)
BUN: 18 mg/dL (ref 8–23)
CO2: 31 mmol/L (ref 22–32)
Calcium: 8.6 mg/dL — ABNORMAL LOW (ref 8.9–10.3)
Chloride: 98 mmol/L (ref 98–111)
Creatinine, Ser: 0.98 mg/dL (ref 0.61–1.24)
GFR calc Af Amer: >60 mL/min (ref >60)
GFR calc non Af Amer: >60 mL/min (ref >60)
Glucose, Bld: 131 mg/dL — ABNORMAL HIGH (ref 70–99)
Potassium: 4.1 mmol/L (ref 3.5–5.1)
Sodium: 137 mmol/L (ref 135–145)

## 2020-01-26 LAB — CBC
HCT: 44.7 % (ref 39.0–52.0)
Hemoglobin: 13.9 g/dL (ref 13.0–17.0)
MCH: 32 pg (ref 26.0–34.0)
MCHC: 31.1 g/dL (ref 30.0–36.0)
MCV: 102.8 fL — ABNORMAL HIGH (ref 80.0–100.0)
Platelets: 187 10*3/uL (ref 150–400)
RBC: 4.35 MIL/uL (ref 4.22–5.81)
RDW: 15.5 % (ref 11.5–15.5)
WBC: 10.9 10*3/uL — ABNORMAL HIGH (ref 4.0–10.5)
nRBC: 0 % (ref 0.0–0.2)

## 2020-01-26 LAB — PROTIME-INR
INR: 2.9 — ABNORMAL HIGH (ref 0.8–1.2)
Prothrombin Time: 30.1 seconds — ABNORMAL HIGH (ref 11.4–15.2)

## 2020-01-26 MED ORDER — ALENDRONATE SODIUM 70 MG PO TABS: 70.0000 mg | ORAL_TABLET | ORAL | 11 refills | Status: DC

## 2020-01-26 MED ORDER — NAPROXEN 500 MG PO TABS: 500.0000 mg | ORAL_TABLET | Freq: Two times a day (BID) | ORAL | 0 refills | Status: DC

## 2020-01-26 MED ORDER — WARFARIN SODIUM 2 MG PO TABS
2.0000 mg | ORAL_TABLET | Freq: Once | ORAL | Status: AC
  Administered 2020-01-26: 2 mg via ORAL
  Filled 2020-01-26: qty 1

## 2020-01-26 NOTE — Progress Notes (Signed)
Subjective: HD#2 Events Overnight: No events overnight  Patient was seen sitting in his bed this morning eating breakfast. He states that he continues to have spasms in his back overnight, but states that he feels better after he was given naproxen this morning.   Objective:  Vital signs in last 24 hours: Vitals:   01/25/20 0625 01/25/20 1000 01/25/20 1356 01/25/20 2238  BP:  132/88 128/80 114/70  Pulse:   87 75  Resp:   (!) 22 20  Temp:  98 F (36.7 C) 99 F (37.2 C) 98.3 F (36.8 C)  TempSrc:  Oral    SpO2:  100% 96% 97%  Weight: 103.1 kg     Height:        Physical Exam: Physical Exam  Constitutional: He is oriented to person, place, and time and well-developed, well-nourished, and in no distress.  HENT:  Head: Normocephalic and atraumatic.  Eyes: EOM are normal.  Cardiovascular: Normal rate, regular rhythm, normal heart sounds and intact distal pulses. Exam reveals no gallop and no friction rub.  No murmur heard. Pulmonary/Chest: No respiratory distress. He has wheezes.  Musculoskeletal:        General: Edema present. Normal range of motion.     Cervical back: Normal range of motion.  Neurological: He is alert and oriented to person, place, and time.  Skin: Skin is warm and dry.    Filed Weights   01/24/20 2055 01/25/20 0625  Weight: 103.7 kg 103.1 kg     Intake/Output Summary (Last 24 hours) at 01/26/2020 0519 Last data filed at 01/25/2020 2300 Gross per 24 hour  Intake 600 ml  Output 875 ml  Net -275 ml    Pertinent labs/Imaging: CBC Latest Ref Rng & Units 01/26/2020 01/25/2020 01/24/2020  WBC 4.0 - 10.5 K/uL 10.9(H) 12.8(H) 14.4(H)  Hemoglobin 13.0 - 17.0 g/dL 13.9 15.0 15.8  Hematocrit 39.0 - 52.0 % 44.7 46.5 48.9  Platelets 150 - 400 K/uL 187 205 210    CMP Latest Ref Rng & Units 01/26/2020 01/25/2020 01/24/2020  Glucose 70 - 99 mg/dL 131(H) 82 -  BUN 8 - 23 mg/dL 18 21 -  Creatinine 0.61 - 1.24 mg/dL 0.98 0.91 0.84  Sodium 135 - 145 mmol/L 137 139 -    Potassium 3.5 - 5.1 mmol/L 4.1 4.0 -  Chloride 98 - 111 mmol/L 98 100 -  CO2 22 - 32 mmol/L 31 29 -  Calcium 8.9 - 10.3 mg/dL 8.6(L) 9.1 -  Total Protein 6.5 - 8.1 g/dL - - -  Total Bilirubin 0.3 - 1.2 mg/dL - - -  Alkaline Phos 38 - 126 U/L - - -  AST 15 - 41 U/L - - -  ALT 0 - 44 U/L - - -    MR LUMBAR SPINE WO CONTRAST: MPRESSION: 1. No acute abnormality of the lumbar spine. 2. Mild lower lumbar degenerative disc disease without spinal canal stenosis. 3. Mild left L3-L4 and right L4-L5 neural foraminal stenosis.  Assessment/Plan:  Principal Problem:   Back pain Active Problems:   Sarcoidosis   Compression fracture of T6 vertebra [facility])    Patient Summary: Jesus Russell is a 69 y.o. with pertinent PMH of sarcoidosis with pulmonary fibrosis (not on home supplemental oxygen), Hx. Of DVT, OSA, steroid induced compression fracture of T6 vertebra, admit for acute respiratory failure on hospital day 2  #Acute on chronic respiratory failure #Sarcoidosis stage IV #30 pack year history of tobacco use Patient has a new supplemental oxygen requirement.  Patient desaturated down to 83% SpO2 while ambuating without oxygen. His SpO2 remained at 92% on 4 L nasal cannula. - Ordered DME home oxygen. - PT does not recommend follow-up outpatient physical therapy.  Continue PT/OT in hospital. - Continue prednisone 20 mg daily - He will need follow up with his PCP and pulmonologist for PFTs and transition to steroid sparing agents.   #Degenerative disc disease of lumbar spine #Foraminal stenosis at L4-L5 right and L3-L4 left MRI was negative for acute compression fracture. It did show mild degenerative disc disease without spinal canal stenosis and mild foraminal stenosis bilaterally. This is likely was causing his new lumbar pain.  - I will discharge the patient on a 5 day course of Naproxen 500 mg bid for symptomatic therapy while his acute pain subsides. - He may benefit from the addition of  duloxetine, but it does have a category C interaction with Warfarin.   #Compression Fracture at T6: - Concern patient has a history of fragility fractures with a compression fracture at T6 vertebra, we will start the patient on alendronate 70 mg every 7 days. I gave the patient specific instructions on how to use the medication in his discharge summary.   Diet: Heart healthy IVF: None VTE: Warfarin  Code: Full  Dispo: Anticipated discharge today.    Marianna Payment, D.O. [facility], PGY-1 Date 01/26/2020 Time 5:19 AM Pager: [phone]

## 2020-01-26 NOTE — Progress Notes (Signed)
OT Cancellation Note  Patient Details Name: Jesus Russell MRN: [id number] DOB: 1951/03/28   Cancelled Treatment:    Reason Eval/Treat Not Completed: Patient declined, no reason specified(Pt very sleepy and drowsy. Falling asleep during conversation. Reports he slept poorly last night due to pain. Planning for dc to home today. Reports he feels comfortable with ADLs at home. Has shower seat in room delivered earlier. Declined HH therapies. Will return as schedule allows.)  [place], OTR/L Acute Rehab Pager: [phone] Office: [phone] 01/26/2020, 11:02 AM

## 2020-01-26 NOTE — Progress Notes (Signed)
PT Cancellation Note  Patient Details Name: Jesus Russell MRN: [id number] DOB: 08-23-1951   Cancelled Treatment:    Reason Eval/Treat Not Completed: Patient declined, no reason specified. Patient reports he is feeling better. Reports he dies not need assistance walking. Declines PT again. He is to be discharged home today.      Shatasia Cutshaw 01/26/2020, 1:34 PM

## 2020-01-26 NOTE — Discharge Instructions (Signed)
Degenerative Disk Disease  Degenerative disk disease is a condition caused by changes that occur in the spinal disks as a person ages. Spinal disks are soft and compressible disks located between the bones of your spine (vertebrae). These disks act like shock absorbers. Degenerative disk disease can affect the whole spine. However, the neck and lower back are most often affected. Many changes can occur in the spinal disks with aging, such as:  The spinal disks may dry and shrink.  Small tears may occur in the tough, outer covering of the disk (annulus).  The disk space may become smaller due to loss of water.  Abnormal growths in the bone (spurs) may occur. This can put pressure on the nerve roots exiting the spinal canal, causing pain.  The spinal canal may become narrowed. What are the causes? This condition may be caused by:  Normal degeneration with age.  Injuries.  Certain activities and sports that cause damage. What increases the risk? The following factors may make you more likely to develop this condition:  Being overweight.  Having a family history of degenerative disk disease.  Smoking.  Sudden injury.  Doing work that requires heavy lifting. What are the signs or symptoms? Symptoms of this condition include:  Pain that varies in intensity. Some people have no pain, while others have severe pain. The location of the pain depends on the part of your backbone that is affected. You may have: ? Pain in your neck or arm if a disk in your neck area is affected. ? Pain in your back, buttocks, or legs if a disk in your lower back is affected.  Pain that becomes worse while bending or reaching up, or with twisting movements.  Pain that may start gradually and then get worse as time passes. It may also start after a major or minor injury.  Numbness or tingling in the arms or legs. How is this diagnosed? This condition may be diagnosed based on:  Your symptoms and  medical history.  A physical exam.  Imaging tests, including: ? An X-ray of the spine. ? MRI. How is this treated? This condition may be treated with:  Medicines.  Rehabilitation exercises. These activities aim to strengthen muscles in your back and abdomen to better support your spine. If treatments do not help to relieve your symptoms or you have severe pain, you may need surgery. Follow these instructions at home: Medicines  Take over-the-counter and prescription medicines only as told by your health care provider.  Do not drive or use heavy machinery while taking prescription pain medicine.  If you are taking prescription pain medicine, take actions to prevent or treat constipation. Your health care provider may recommend that you: ? Drink enough fluid to keep your urine pale yellow. ? Eat foods that are high in fiber, such as fresh fruits and vegetables, whole grains, and beans. ? Limit foods that are high in fat and processed sugars, such as fried or sweet foods. ? Take an over-the-counter or prescription medicine for constipation. Activity  Rest as told by your health care provider.  Ask your health care provider what activities are safe for you. Return to your normal activities as directed.  Avoid sitting for a long time without moving. Get up to take short walks every 1-2 hours. This is important to improve blood flow and breathing. Ask for help if you feel weak or unsteady.  Perform relaxation exercises as told by your health care provider.  Maintain good posture.  Do not lift anything that is heavier than 10 lb (4.5 kg), or the limit that you are told, until your health care provider says that it is safe.  Follow proper lifting and walking techniques as told by your health care provider. Managing pain, stiffness, and swelling   If directed, put ice on the painful area. Icing can help to relieve pain. ? Put ice in a plastic bag. ? Place a towel between your  skin and the bag. ? Leave the ice on for 20 minutes, 2-3 times a day.  If directed, apply heat to the painful area as often as told by your health care provider. Heat can reduce the stiffness of your muscles. Use the heat source that your health care provider recommends, such as a moist heat pack or a heating pad. ? Place a towel between your skin and the heat source. ? Leave the heat on for 20-30 minutes. ? Remove the heat if your skin turns bright red. This is especially important if you are unable to feel pain, heat, or cold. You may have a greater risk of getting burned. General instructions  Change your sitting, standing, and sleeping habits as told by your health care provider.  Avoid sitting in the same position for long periods of time. Change positions frequently.  Lose weight or maintain a healthy weight as told by your health care provider.  Do not use any products that contain nicotine or tobacco, such as cigarettes and e-cigarettes. If you need help quitting, ask your health care provider.  Wear supportive footwear.  Keep all follow-up visits as told by your health care provider. This is important. This may include visits for physical therapy. Contact a health care provider if you:  Have pain that does not go away within 1-4 weeks.  Lose your appetite.  Lose weight without trying. Get help right away if you:  Have severe pain.  Notice weakness in your arms, hands, or legs.  Begin to lose control of your bladder or bowel movements.  Have fevers or night sweats. Summary  Degenerative disk disease is a condition caused by changes that occur in the spinal disks as a person ages.  Degenerative disk disease can affect the whole spine. However, the neck and lower back are most often affected.  Take over-the-counter and prescription medicines only as told by your health care provider. This information is not intended to replace advice given to you by your health care  provider. Make sure you discuss any questions you have with your health care provider. Document Revised: 12/05/2017 Document Reviewed: 12/05/2017 Elsevier Patient Education  2020 [employer].

## 2020-01-26 NOTE — Discharge Summary (Addendum)
Name: Jesus Russell MRN: [id number] DOB: July 07, 1951 69 y.o. PCP: Jeanmarie Hubert, MD  Date of Admission: 01/24/2020  1:42 PM Date of Discharge: 2/2/202102/01/2020 Attending Physician: Lalla Brothers, MD  Discharge Diagnosis: 1. 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 Take with a full glass of water on an empty stomach.   Breo Ellipta 200-25 MCG/INH Aepb Generic drug: fluticasone furoate-vilanterol INHALE 1 PUFF INTO THE LUNGS DAILY What changed: See the new instructions.   cholecalciferol 25 MCG (1000 UNIT) tablet Commonly known as: VITAMIN D3 Take 1,000 Units by mouth daily.   losartan-hydrochlorothiazide 50-12.5 MG tablet Commonly known as: HYZAAR Take 1 tablet by mouth daily.   naproxen 500 MG tablet Commonly known as: Naprosyn Take 1 tablet (500 mg total) by mouth 2 (two) times daily with a meal for 4 days.   omeprazole 40 MG capsule Commonly known as: PriLOSEC Take 1 capsule (40 mg total) by mouth daily.   predniSONE 10 MG tablet Commonly known as: DELTASONE Take 30 mg for 2 months, then 20 mg for 2 months, then 10 mg for 2 months   Suprep Bowel Prep Kit 17.5-3.13-1.6 GM/177ML Soln Generic drug: Na Sulfate-K Sulfate-Mg Sulf Take 1 kit by mouth as directed.   warfarin 4 MG tablet Commonly known as: COUMADIN Take as directed. If you are unsure how to take this medication, talk to your nurse or doctor. Original instructions: TAKE 1/2 OF YOUR 4 MG WARFARIN TABLET ON SUNDAYS, TUESDAYS, THURSDAYS AND SATURDAYS. ALL OTHER  DAYS TAKE 1 TABLET What changed:  how much to take how to take this when to take this additional instructions   zinc gluconate 50 MG tablet Take 50 mg by mouth daily.        Disposition and follow-up:   [name] was discharged from [facility] in Good condition.  At the hospital follow up visit please address:  1.  Follow up:  A) Sarcoidosis - Pulmonary f/u with new set of PFT and possible transition to steroid sparing medication.  B) Compression fracture - ortho referral for possible kyphoplasty vs conservative therapy. Make sure patient understands how to take his bisphosphonate medication.   2.  Labs / imaging needed at time of follow-up: PFTs  3.  Pending labs/ test needing follow-up: none  Follow-up Appointments:    Hospital Course by problem list: 1. Acute on chronic respiratory failure 2/2 to sarcoidosis with pulmonary fibrosis -patient admitted with acute respiratory distress and new oxygen requirement.  Patient sees pulmonology and is currently on a long-term taper of prednisone for sarcoidosis.  Chest x-ray shows stable appearance of chest compared to previous XR with findings suggestive of sarcoid related pulmonary fibrosis.  Patient desaturated below 90 while ambulating without oxygen.  Maintained SPO2 of 92% while ambulating with 4 L O2.  He was sent home with oxygen at home with instructions to follow-up with his pulmonologist for repeat PFTs and transition to steroid sparing medications.  2. Degenerative disc disease of lumbar spine -patient presented with worsening lower back pain with  spasms.  He states that this pain is different than the pain he experiences dizziness midthoracic back due to his compression fracture.  The patient states that he still able to ambulate and perform ADLs despite pain.  Due to patient's history of long-term steroids and concern for new compression fracture, MRI was performed and showed no acute abnormalities of  the lumbar spine with mild lower lumbar degenerative disc disease without spinal canal stenosis.  He did have mild foraminal stenosis on the left at L3-L4 and right L4-L5.  Patient was sent home with a prescription of naproxen 500 mg twice daily for 5 days.  Discharge Vitals:   BP 133/87 (BP Location: Left Arm)   Pulse 78   Temp 97.9 F (36.6 C) (Oral)   Resp 16   Ht 5' 9" (1.753 m)   Wt 103.1 kg   SpO2 100%   BMI 33.57 kg/m   Pertinent Labs, Studies, and Procedures:  CBC Latest Ref Rng & Units 01/26/2020 01/25/2020 01/24/2020  WBC 4.0 - 10.5 K/uL 10.9(H) 12.8(H) 14.4(H)  Hemoglobin 13.0 - 17.0 g/dL 13.9 15.0 15.8  Hematocrit 39.0 - 52.0 % 44.7 46.5 48.9  Platelets 150 - 400 K/uL 187 205 210   CMP Latest Ref Rng & Units 01/26/2020 01/25/2020 01/24/2020  Glucose 70 - 99 mg/dL 131(H) 82 -  BUN 8 - 23 mg/dL 18 21 -  Creatinine 0.61 - 1.24 mg/dL 0.98 0.91 0.84  Sodium 135 - 145 mmol/L 137 139 -  Potassium 3.5 - 5.1 mmol/L 4.1 4.0 -  Chloride 98 - 111 mmol/L 98 100 -  CO2 22 - 32 mmol/L 31 29 -  Calcium 8.9 - 10.3 mg/dL 8.6(L) 9.1 -  Total Protein 6.5 - 8.1 g/dL - - -  Total Bilirubin 0.3 - 1.2 mg/dL - - -  Alkaline Phos 38 - 126 U/L - - -  AST 15 - 41 U/L - - -  ALT 0 - 44 U/L - - -   Lumbar MRI: 1. No acute abnormality of the lumbar spine. 2. Mild lower lumbar degenerative disc disease without spinal canal stenosis. 3. Mild left L3-L4 and right L4-L5 neural foraminal stenosis.   Discharge Instructions: Discharge Instructions     Diet - low sodium heart healthy   Complete by: As directed    Discharge instructions   Complete by: As directed    You were hospitalized for Acute Respiratory Failure. Thank you for allowing [place] to be part of your care.   We arranged for you to follow up at: [facility]. The clinic will call you to make an appointment.    Please note these changes made to your medications:   Please START taking: all home medications - Naproxen 500  mg twice daily as needed. - Home Oxygen  - Fosamax 70 mg daily  Important information about Fosamax prescription:  Bisphosphonates should be taken alone on an empty stomach first thing in the morning with at least 240 mL (8 oz) of water. After administration, the patient should not have food, drink, medications, or supplements for at least one half-hour  Bisphosphonates should be taken alone on an empty stomach first thing in the morning with at least 240 mL (8 oz) of water. After administration, the patient should not have food, drink, medications, or supplements for at least one half-hour   Please STOP taking:    Please make sure to follow up with your PCP this week.  Please call our clinic if you  have any questions or concerns, we may be able to help and keep you from a long and expensive emergency room wait. Our clinic and after hours phone number is [phone], the best time to call is Monday through Friday 9 am to 4 pm but there is always someone available 24/7 if you have an emergency. If you need medication refills please notify your pharmacy one week in advance and they will send [place] a request.   Increase activity slowly   Complete by: As directed        Signed: Marianna Payment, MD 01/27/2020, 5:29 AM   Pager: Pager: [phone]

## 2020-01-26 NOTE — TOC Transition Note (Signed)
Transition of Care [facility]) - CM/SW Discharge Note   Patient Details  Name: Elyas Villamor MRN: [id number] Date of Birth: 28-May-1951  Transition of Care [facility]) CM/SW Contact:  Maryclare Labrador, RN Phone Number: 01/26/2020, 11:12 AM   Clinical Narrative:  Pt deemed stable for dsicharge home toay.  Pt informed CM that he will travel home via private vehicle.  Pt declined shower stool and HH as ordered.  CM offered choice of oxygen agency - pt chose [facility].  [facility] to review for acceptance and follow up with CM     Final next level of care: Home/Self Care Barriers to Discharge: Barriers Resolved   Patient Goals and CMS Choice   CMS Medicare.gov Compare Post Acute Care list provided to:: Patient Choice offered to / list presented to : Patient  Discharge Placement                       Discharge Plan and Services                DME Arranged: Oxygen DME Agency: [facility] Date DME Agency Contacted: 01/26/20 Time DME Agency Contacted: 1112 Representative spoke with at DME Agency: Enzo Montgomery            Social Determinants of Health ([place]) Interventions     Readmission Risk Interventions No flowsheet data found.

## 2020-01-26 NOTE — Progress Notes (Signed)
Pt requested Flexeril - received one time dose earlier 2/1 _0 . Pt states that he 'saved' it and took it after going to MRI. This RN did not witness that. Paged on-call IMTS for RX request - received another one time dose for 5m.   Back pain 7/10 - on call paged - pt specifically asked for Norco. On call advised to give Tylenol and wait for primary team in the morning. Pt was sleeping soundly. Pt took the Tylenol later and went back to sleep.

## 2020-01-26 NOTE — Progress Notes (Signed)
ANTICOAGULATION CONSULT NOTE  Pharmacy Consult for Warfarin Indication: Hx of VTE    Patient Measurements: Height: _0  (175.3 cm) Weight: 227 lb 4.7 oz (103.1 kg) IBW/kg (Calculated) : 70.7   Vital Signs: Temp: 97.9 F (36.6 C) (02/02 0829) Temp Source: Oral (02/02 0829) BP: 133/87 (02/02 0829) Pulse Rate: 78 (02/02 0829)  Labs: Recent Labs    01/24/20 1615 01/24/20 1738 01/24/20 1948 01/24/20 2021 01/24/20 2021 01/25/20 0328 01/26/20 0254  HGB 16.7  --    < > 15.8   < > 15.0 13.9  HCT 52.5*  --    < > 48.9  --  46.5 44.7  PLT 215  --    < > 210  --  205 187  LABPROT 29.1*  --   --   --   --  30.1* 30.1*  INR 2.8*  --   --   --   --  2.9* 2.9*  CREATININE 1.03  --    < > 0.84  --  0.91 0.98  TROPONINIHS 9 10  --   --   --   --   --    < > = values in this interval not displayed.    Estimated Creatinine Clearance: 87.8 mL/min (by C-G formula based on SCr of 0.98 mg/dL).   Medical History: Past Medical History:  Diagnosis Date  . DVT of lower extremity, bilateral ([place])   . Hypertension   . Incarcerated umbilical hernia 62/9528   Status post repair  . Lupus anticoagulant positive    Per records from [facility]  . 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 Pulmonary embolism ([place]) 04/2011  . Sarcoidosis 1989  . Subdural hematoma ([facility])    Remote episode, occurred when taking excedrin and warfarin.     Medications:  Scheduled:  . fluticasone furoate-vilanterol  1 puff Inhalation Daily  . naproxen  500 mg Oral BID WC  . pantoprazole  80 mg Oral Daily  . predniSONE  20 mg Oral Q breakfast  . Warfarin - Pharmacist Dosing Inpatient   Does not apply q1800    Assessment: Patient is a 69 year old male with sarcoidosis, hx of DVT ( on warfarin) , HTN, and compression fx  on T6. The patient patient presented and is being admitted for back pain. Pharmacy has been asked to continue dosing his warfarin at this time. INR is therapeutic at 2.9.  PTA dosing: Warfarin 8m MWF and 2 mg on all other days  Goal of Therapy:  INR 2-3 Monitor platelets by anticoagulation protocol: Yes   Plan:  - Warfarin 2 mg PO x 1  - Daily PT-INR     MHarvel Quale2/01/2020 1:19 PM

## 2020-01-26 NOTE — Progress Notes (Signed)
PT Cancellation Note  Patient Details Name: Jesus Russell MRN: [id number] DOB: [birth year]   Cancelled Treatment:    Reason Eval/Treat Not Completed: Patient declined, no reason specified. Patient reports he did not sleep at all last night, declines PT at this time. Will check back later if time allows.    Aisea Bouldin 01/26/2020, 11:03 AM

## 2020-01-27 ENCOUNTER — Other Ambulatory Visit: Payer: Self-pay | Admitting: Internal Medicine

## 2020-01-27 NOTE — Telephone Encounter (Signed)
Needs refill on flexril ; pt only have 7 naproxen (NAPROSYN) 500 MG tablet left  ;pt contact [place] [place] [facility] [place] [facility] [place]

## 2020-01-28 NOTE — Telephone Encounter (Signed)
Also wants flexeril script please advise asap

## 2020-01-28 NOTE — Telephone Encounter (Signed)
Pt calling back about his Flexril.  Please call patient back.

## 2020-01-29 ENCOUNTER — Other Ambulatory Visit: Payer: Self-pay | Admitting: Internal Medicine

## 2020-01-29 LAB — CULTURE, BLOOD (ROUTINE X 2)
Culture: NO GROWTH
Culture: NO GROWTH

## 2020-01-29 MED ORDER — CYCLOBENZAPRINE HCL 5 MG PO TABS: 5.0000 mg | ORAL_TABLET | Freq: Three times a day (TID) | ORAL | 0 refills | Status: DC | PRN

## 2020-01-29 MED ORDER — NAPROXEN 500 MG PO TABS: 500.0000 mg | ORAL_TABLET | Freq: Two times a day (BID) | ORAL | 0 refills | Status: AC

## 2020-01-29 NOTE — Telephone Encounter (Signed)
Rx for flexeril sent

## 2020-02-02 ENCOUNTER — Ambulatory Visit: Payer: Medicare Other

## 2020-02-03 ENCOUNTER — Ambulatory Visit (INDEPENDENT_AMBULATORY_CARE_PROVIDER_SITE_OTHER): Payer: Medicare Other | Admitting: Internal Medicine

## 2020-02-03 ENCOUNTER — Other Ambulatory Visit: Payer: Self-pay

## 2020-02-03 VITALS — BP 132/85 | HR 95 | Temp 98.0°F | Ht 69.0 in | Wt 236.5 lb

## 2020-02-03 DIAGNOSIS — R600 Localized edema: Secondary | ICD-10-CM | POA: Diagnosis present

## 2020-02-03 DIAGNOSIS — S22050K Wedge compression fracture of T5-T6 vertebra, subsequent encounter for fracture with nonunion: Secondary | ICD-10-CM

## 2020-02-03 DIAGNOSIS — D869 Sarcoidosis, unspecified: Secondary | ICD-10-CM | POA: Diagnosis not present

## 2020-02-03 MED ORDER — NAPROXEN 500 MG PO TABS: 500.0000 mg | ORAL_TABLET | Freq: Two times a day (BID) | ORAL | 0 refills | Status: DC

## 2020-02-03 NOTE — Progress Notes (Signed)
   CC: Hospital follow-up, follow-up back pain, pulmonary sarcoidosis  HPI:  [name] is a 69 y.o. very pleasant African-American gentleman with medical history significant for sarcoidosis, compression fracture and DVT Chronic anticoagulation.  Follow-up after recent hospitalization.  See problem based charting for further details.  Past Medical History:  Diagnosis Date  . DVT of lower extremity, bilateral ([place])   . Hypertension   . Incarcerated umbilical hernia 09/1750   Status post repair  . Lupus anticoagulant positive    Per records from [facility]  . 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 Pulmonary artery hypertension ([place]) 04/2011   Mildly elevated pulmonary artery pressure in setting of PE (from [facility])  . Pulmonary embolism ([place]) 04/2011  . Sarcoidosis 1989  . Subdural hematoma ([facility])    Remote episode, occurred when taking excedrin and warfarin.    Review of Systems:  As per HPI  Physical Exam:  Vitals:   02/03/20 1441 02/03/20 1443  BP:  132/85  Pulse:  95  Temp:  98 F (36.7 C)  TempSrc:  Oral  SpO2:  97%  Weight: 236 lb 8 oz (107.3 kg)   Height: _0  (1.753 m)    Physical Exam  Constitutional: He is well-developed, well-nourished, and in no distress.  HENT:  Head: Normocephalic and atraumatic.  Cardiovascular: Normal rate, regular rhythm and normal heart sounds.  Pulmonary/Chest: Effort normal and breath sounds normal. No respiratory distress (On 2L [place]). He has no wheezes. He has no rales.  Musculoskeletal:        General: Edema (2-3+ bilateral LE pitting edema) present. No deformity.     Comments: Back Brace in place    Assessment & Plan:   See Encounters Tab for problem based charting.  Patient discussed with Dr. Rebeca Alert

## 2020-02-03 NOTE — Patient Instructions (Signed)
Mr. Hodapp,   Thanks for seeing [place] today. I have ordered an echocardiogram and also given you a refill of naproxen.   Also, you have a referral to see a radiologist for further management of your compression fractures.   Take care! Dr. Eileen Stanford  Please call the internal medicine center clinic if you have any questions or concerns, we may be able to help and keep you from a long and expensive emergency room wait. Our clinic and after hours phone number is [phone], the best time to call is Monday through Friday 9 am to 4 pm but there is always someone available 24/7 if you have an emergency. If you need medication refills please notify your pharmacy one week in advance and they will send [place] a request.

## 2020-02-03 NOTE — Assessment & Plan Note (Signed)
Compression fracture: He has a known T6, T8 compression fracture and has been evaluated in the clinic for this.  During his recent hospitalization, an MRI of the lumbar spine was performed which did not reveal any acute lumbar fractures.  He did have Mild lower lumbar degenerative disc disease without spinal canal Stenosis, Mild left L3-L4 and right L4-L5 neural foraminal stenosis.  During his last clinic visit, he was referred to interventional radiology for evaluation of possible kyphoplasty however his prior records from his prior PCP and imaging was still pending and has thus not been able to follow-up with IR.  He does have a back brace and still endorses pain.  Currently his pain level is 5/10 however intensifies with ambulation.  He was discharged with a short course of naproxen which he is asking for refill.  He is on warfarin and given the medication interaction, his warfarin dose was adjusted by Dr. Johney Maine.  Plan: -Obtain records/imaging from prior PCP -Follow-up interventional radiology

## 2020-02-03 NOTE — Assessment & Plan Note (Signed)
Chronic respiratory failure, pulmonary sarcoidosis: He was recently admitted to the hospital from Jan 24, 2020 to January 26, 2020.  He does have pulmonary sarcoidosis and was recently started on a prednisone taper about 4 weeks ago.  During his hospitalization, he was found to be hypoxic with new oxygen requirement.  He was subsequently discharged on supplemental oxygen 2 L nasal cannula.  He has been using his supplemental oxygen without any issues and maintaining pulse oximetry.  Plan: -Follow-up with pulmonology (Dr. Shearon Stalls) -Continue prednisone taper.  Currently on 20 mg daily (started with 40 mg)

## 2020-02-03 NOTE — Assessment & Plan Note (Signed)
Lower extremity edema: He endorses a 3 to 4-week history of bilateral lower extremity edema which has been worsening.  On further questioning, he states that this coincides to when he started taking the prednisone.  He denies orthopnea.  He is able to lie flat on his side to sleep without any shortness of breath.  In addition, he states that his lower extremity edema was present during his recent hospitalization.  On physical exams, there is no evidence of crackles on lung examination.  He does have 2-3+ lower extremity pitting edema bilaterally  Assessment: Interstitial edema worsened by steroid use versus venous insufficiency.  Cannot rule out cardiac etiology.  Plan: -Follow-up echocardiogram -If unremarkable, can try compression stockings

## 2020-02-04 NOTE — Progress Notes (Signed)
Internal Medicine Clinic Attending  Case discussed with Dr. Eileen Stanford at the time of the visit.  We reviewed the resident's history and exam and pertinent patient test results.  I agree with the assessment, diagnosis, and plan of care documented in the resident's note.  Lenice Pressman, M.D., Ph.D.

## 2020-02-10 ENCOUNTER — Ambulatory Visit (HOSPITAL_COMMUNITY): Payer: Medicare Other

## 2020-02-14 ENCOUNTER — Emergency Department (HOSPITAL_COMMUNITY): Payer: Medicare Other

## 2020-02-14 ENCOUNTER — Inpatient Hospital Stay (HOSPITAL_COMMUNITY)
Admission: EM | Admit: 2020-02-14 | Discharge: 2020-03-01 | DRG: 208 | Disposition: A | Discharge status: Other Acute Inpatient Hospital | Payer: Medicare Other | Attending: Internal Medicine | Admitting: Internal Medicine

## 2020-02-14 ENCOUNTER — Other Ambulatory Visit: Payer: Self-pay

## 2020-02-14 ENCOUNTER — Encounter (HOSPITAL_COMMUNITY): Payer: Self-pay | Admitting: Emergency Medicine

## 2020-02-14 DIAGNOSIS — R05 Cough: Secondary | ICD-10-CM | POA: Diagnosis not present

## 2020-02-14 DIAGNOSIS — I2721 Secondary pulmonary arterial hypertension: Secondary | ICD-10-CM | POA: Diagnosis present

## 2020-02-14 DIAGNOSIS — M4854XA Collapsed vertebra, not elsewhere classified, thoracic region, initial encounter for fracture: Secondary | ICD-10-CM | POA: Diagnosis present

## 2020-02-14 DIAGNOSIS — I5082 Biventricular heart failure: Secondary | ICD-10-CM | POA: Diagnosis not present

## 2020-02-14 DIAGNOSIS — Z8616 Personal history of COVID-19: Secondary | ICD-10-CM

## 2020-02-14 DIAGNOSIS — J9601 Acute respiratory failure with hypoxia: Secondary | ICD-10-CM

## 2020-02-14 DIAGNOSIS — Z452 Encounter for adjustment and management of vascular access device: Secondary | ICD-10-CM

## 2020-02-14 DIAGNOSIS — Z6833 Body mass index (BMI) 33.0-33.9, adult: Secondary | ICD-10-CM

## 2020-02-14 DIAGNOSIS — R0600 Dyspnea, unspecified: Secondary | ICD-10-CM

## 2020-02-14 DIAGNOSIS — D6861 Antiphospholipid syndrome: Secondary | ICD-10-CM | POA: Diagnosis present

## 2020-02-14 DIAGNOSIS — Z9289 Personal history of other medical treatment: Secondary | ICD-10-CM

## 2020-02-14 DIAGNOSIS — I110 Hypertensive heart disease with heart failure: Secondary | ICD-10-CM | POA: Diagnosis present

## 2020-02-14 DIAGNOSIS — I2609 Other pulmonary embolism with acute cor pulmonale: Secondary | ICD-10-CM | POA: Diagnosis not present

## 2020-02-14 DIAGNOSIS — D869 Sarcoidosis, unspecified: Secondary | ICD-10-CM | POA: Diagnosis present

## 2020-02-14 DIAGNOSIS — U071 COVID-19: Secondary | ICD-10-CM | POA: Diagnosis present

## 2020-02-14 DIAGNOSIS — R140 Abdominal distension (gaseous): Secondary | ICD-10-CM

## 2020-02-14 DIAGNOSIS — Z978 Presence of other specified devices: Secondary | ICD-10-CM

## 2020-02-14 DIAGNOSIS — S065X9S Traumatic subdural hemorrhage with loss of consciousness of unspecified duration, sequela: Secondary | ICD-10-CM

## 2020-02-14 DIAGNOSIS — R579 Shock, unspecified: Secondary | ICD-10-CM

## 2020-02-14 DIAGNOSIS — Z9981 Dependence on supplemental oxygen: Secondary | ICD-10-CM

## 2020-02-14 DIAGNOSIS — D539 Nutritional anemia, unspecified: Secondary | ICD-10-CM | POA: Diagnosis present

## 2020-02-14 DIAGNOSIS — R10812 Left upper quadrant abdominal tenderness: Secondary | ICD-10-CM

## 2020-02-14 DIAGNOSIS — E870 Hyperosmolality and hypernatremia: Secondary | ICD-10-CM | POA: Diagnosis present

## 2020-02-14 DIAGNOSIS — Z7901 Long term (current) use of anticoagulants: Secondary | ICD-10-CM

## 2020-02-14 DIAGNOSIS — D860 Sarcoidosis of lung: Secondary | ICD-10-CM | POA: Diagnosis present

## 2020-02-14 DIAGNOSIS — R042 Hemoptysis: Secondary | ICD-10-CM

## 2020-02-14 DIAGNOSIS — I82442 Acute embolism and thrombosis of left tibial vein: Secondary | ICD-10-CM | POA: Diagnosis present

## 2020-02-14 DIAGNOSIS — I2699 Other pulmonary embolism without acute cor pulmonale: Secondary | ICD-10-CM

## 2020-02-14 DIAGNOSIS — E669 Obesity, unspecified: Secondary | ICD-10-CM | POA: Diagnosis present

## 2020-02-14 DIAGNOSIS — E162 Hypoglycemia, unspecified: Secondary | ICD-10-CM | POA: Diagnosis present

## 2020-02-14 DIAGNOSIS — Z79899 Other long term (current) drug therapy: Secondary | ICD-10-CM

## 2020-02-14 DIAGNOSIS — Z7952 Long term (current) use of systemic steroids: Secondary | ICD-10-CM

## 2020-02-14 DIAGNOSIS — R570 Cardiogenic shock: Secondary | ICD-10-CM | POA: Diagnosis not present

## 2020-02-14 DIAGNOSIS — Z87891 Personal history of nicotine dependence: Secondary | ICD-10-CM

## 2020-02-14 DIAGNOSIS — R040 Epistaxis: Secondary | ICD-10-CM

## 2020-02-14 DIAGNOSIS — Z86711 Personal history of pulmonary embolism: Secondary | ICD-10-CM | POA: Diagnosis present

## 2020-02-14 DIAGNOSIS — N170 Acute kidney failure with tubular necrosis: Secondary | ICD-10-CM | POA: Diagnosis not present

## 2020-02-14 DIAGNOSIS — J9382 Other air leak: Secondary | ICD-10-CM | POA: Diagnosis not present

## 2020-02-14 DIAGNOSIS — I10 Essential (primary) hypertension: Secondary | ICD-10-CM | POA: Diagnosis present

## 2020-02-14 DIAGNOSIS — Z7951 Long term (current) use of inhaled steroids: Secondary | ICD-10-CM

## 2020-02-14 DIAGNOSIS — S22050A Wedge compression fracture of T5-T6 vertebra, initial encounter for closed fracture: Secondary | ICD-10-CM | POA: Diagnosis present

## 2020-02-14 DIAGNOSIS — G9341 Metabolic encephalopathy: Secondary | ICD-10-CM | POA: Diagnosis not present

## 2020-02-14 DIAGNOSIS — T171XXA Foreign body in nostril, initial encounter: Secondary | ICD-10-CM | POA: Diagnosis not present

## 2020-02-14 DIAGNOSIS — J9600 Acute respiratory failure, unspecified whether with hypoxia or hypercapnia: Secondary | ICD-10-CM

## 2020-02-14 LAB — CBC WITH DIFFERENTIAL/PLATELET
Abs Immature Granulocytes: 0.1 10*3/uL — ABNORMAL HIGH (ref 0.00–0.07)
Basophils Absolute: 0.1 10*3/uL (ref 0.0–0.1)
Basophils Relative: 1 %
Eosinophils Absolute: 0.3 10*3/uL (ref 0.0–0.5)
Eosinophils Relative: 3 %
HCT: 49.6 % (ref 39.0–52.0)
Hemoglobin: 15.7 g/dL (ref 13.0–17.0)
Immature Granulocytes: 1 %
Lymphocytes Relative: 16 %
Lymphs Abs: 2 10*3/uL (ref 0.7–4.0)
MCH: 32.2 pg (ref 26.0–34.0)
MCHC: 31.7 g/dL (ref 30.0–36.0)
MCV: 101.8 fL — ABNORMAL HIGH (ref 80.0–100.0)
Monocytes Absolute: 0.9 10*3/uL (ref 0.1–1.0)
Monocytes Relative: 7 %
Neutro Abs: 9.2 10*3/uL — ABNORMAL HIGH (ref 1.7–7.7)
Neutrophils Relative %: 72 %
Platelets: 174 10*3/uL (ref 150–400)
RBC: 4.87 MIL/uL (ref 4.22–5.81)
RDW: 15 % (ref 11.5–15.5)
WBC: 12.7 10*3/uL — ABNORMAL HIGH (ref 4.0–10.5)
nRBC: 0 % (ref 0.0–0.2)

## 2020-02-14 LAB — COMPREHENSIVE METABOLIC PANEL
ALT: 18 U/L (ref 0–44)
AST: 27 U/L (ref 15–41)
Albumin: 3.6 g/dL (ref 3.5–5.0)
Alkaline Phosphatase: 89 U/L (ref 38–126)
Anion gap: 11 (ref 5–15)
BUN: 19 mg/dL (ref 8–23)
CO2: 30 mmol/L (ref 22–32)
Calcium: 8.6 mg/dL — ABNORMAL LOW (ref 8.9–10.3)
Chloride: 102 mmol/L (ref 98–111)
Creatinine, Ser: 0.86 mg/dL (ref 0.61–1.24)
GFR calc Af Amer: >60 mL/min (ref >60)
GFR calc non Af Amer: >60 mL/min (ref >60)
Glucose, Bld: 97 mg/dL (ref 70–99)
Potassium: 4.3 mmol/L (ref 3.5–5.1)
Sodium: 143 mmol/L (ref 135–145)
Total Bilirubin: 1.4 mg/dL — ABNORMAL HIGH (ref 0.3–1.2)
Total Protein: 6.6 g/dL (ref 6.5–8.1)

## 2020-02-14 LAB — CBC
HCT: 49.4 % (ref 39.0–52.0)
Hemoglobin: 15.7 g/dL (ref 13.0–17.0)
MCH: 32.4 pg (ref 26.0–34.0)
MCHC: 31.8 g/dL (ref 30.0–36.0)
MCV: 102.1 fL — ABNORMAL HIGH (ref 80.0–100.0)
Platelets: 167 10*3/uL (ref 150–400)
RBC: 4.84 MIL/uL (ref 4.22–5.81)
RDW: 15 % (ref 11.5–15.5)
WBC: 13 10*3/uL — ABNORMAL HIGH (ref 4.0–10.5)
nRBC: 0 % (ref 0.0–0.2)

## 2020-02-14 LAB — LIPASE, BLOOD: Lipase: 19 U/L (ref 11–51)

## 2020-02-14 LAB — PROTIME-INR
INR: 1.2 (ref 0.8–1.2)
Prothrombin Time: 15.4 seconds — ABNORMAL HIGH (ref 11.4–15.2)

## 2020-02-14 LAB — APTT: aPTT: 32 seconds (ref 24–36)

## 2020-02-14 LAB — SARS CORONAVIRUS 2 (TAT 6-24 HRS): SARS Coronavirus 2: POSITIVE — AB

## 2020-02-14 MED ORDER — IOHEXOL 300 MG/ML  SOLN
100.0000 mL | Freq: Once | INTRAMUSCULAR | Status: AC | PRN
  Administered 2020-02-14: 100 mL via INTRAVENOUS

## 2020-02-14 MED ORDER — CYCLOBENZAPRINE HCL 10 MG PO TABS
5.0000 mg | ORAL_TABLET | Freq: Three times a day (TID) | ORAL | Status: DC | PRN
  Administered 2020-02-15 – 2020-02-16 (×2): 5 mg via ORAL
  Filled 2020-02-14 (×2): qty 1

## 2020-02-14 MED ORDER — VITAMIN D 25 MCG (1000 UNIT) PO TABS
1000.0000 [IU] | ORAL_TABLET | Freq: Every day | ORAL | Status: DC
  Administered 2020-02-15: 1000 [IU] via ORAL
  Filled 2020-02-14 (×2): qty 1

## 2020-02-14 MED ORDER — FUROSEMIDE 10 MG/ML IJ SOLN: 20.0000 mg | Freq: Once | INTRAMUSCULAR | Status: DC

## 2020-02-14 MED ORDER — FLUTICASONE FUROATE-VILANTEROL 200-25 MCG/INH IN AEPB
1.0000 | INHALATION_SPRAY | Freq: Every day | RESPIRATORY_TRACT | Status: DC
  Administered 2020-02-15 (×2): 1 via RESPIRATORY_TRACT
  Filled 2020-02-14: qty 28

## 2020-02-14 MED ORDER — LOSARTAN POTASSIUM 50 MG PO TABS: 50.0000 mg | ORAL_TABLET | Freq: Every day | ORAL | Status: DC

## 2020-02-14 MED ORDER — SODIUM CHLORIDE 0.9 % IV SOLN: 100.0000 mg | Freq: Every day | INTRAVENOUS | Status: DC

## 2020-02-14 MED ORDER — HEPARIN BOLUS VIA INFUSION
5000.0000 [IU] | Freq: Once | INTRAVENOUS | Status: AC
  Administered 2020-02-14: 5000 [IU] via INTRAVENOUS
  Filled 2020-02-14: qty 5000

## 2020-02-14 MED ORDER — DEXAMETHASONE 4 MG PO TABS
6.0000 mg | ORAL_TABLET | Freq: Every day | ORAL | Status: DC
  Filled 2020-02-14: qty 2

## 2020-02-14 MED ORDER — IOHEXOL 350 MG/ML SOLN
80.0000 mL | Freq: Once | INTRAVENOUS | Status: AC | PRN
  Administered 2020-02-14: 69 mL via INTRAVENOUS

## 2020-02-14 MED ORDER — SODIUM CHLORIDE 0.9 % IV SOLN
200.0000 mg | Freq: Once | INTRAVENOUS | Status: AC
  Administered 2020-02-15: 200 mg via INTRAVENOUS
  Filled 2020-02-14: qty 200

## 2020-02-14 MED ORDER — ACETAMINOPHEN 325 MG PO TABS
650.0000 mg | ORAL_TABLET | Freq: Four times a day (QID) | ORAL | Status: DC | PRN
  Administered 2020-02-15 – 2020-02-16 (×2): 650 mg via ORAL
  Filled 2020-02-14 (×3): qty 2

## 2020-02-14 MED ORDER — HEPARIN (PORCINE) 25000 UT/250ML-% IV SOLN
1100.0000 [IU]/h | INTRAVENOUS | Status: DC
  Administered 2020-02-14: 1500 [IU]/h via INTRAVENOUS
  Administered 2020-02-15: 1300 [IU]/h via INTRAVENOUS
  Filled 2020-02-14 (×2): qty 250

## 2020-02-14 MED ORDER — FUROSEMIDE 10 MG/ML IJ SOLN
40.0000 mg | Freq: Once | INTRAMUSCULAR | Status: AC
  Administered 2020-02-14: 40 mg via INTRAVENOUS
  Filled 2020-02-14: qty 4

## 2020-02-14 MED ORDER — ZINC GLUCONATE 50 MG PO TABS: 50.0000 mg | ORAL_TABLET | Freq: Every day | ORAL | Status: DC

## 2020-02-14 MED ORDER — HYDROCHLOROTHIAZIDE 12.5 MG PO CAPS: 12.5000 mg | ORAL_CAPSULE | Freq: Every day | ORAL | Status: DC

## 2020-02-14 MED ORDER — LACTATED RINGERS IV BOLUS
1000.0000 mL | Freq: Once | INTRAVENOUS | Status: AC
  Administered 2020-02-14: 1000 mL via INTRAVENOUS

## 2020-02-14 MED ORDER — SENNA 8.6 MG PO TABS: 1.0000 | ORAL_TABLET | Freq: Every day | ORAL | Status: DC | PRN

## 2020-02-14 MED ORDER — LOSARTAN POTASSIUM-HCTZ 50-12.5 MG PO TABS: 1.0000 | ORAL_TABLET | Freq: Every day | ORAL | Status: DC

## 2020-02-14 NOTE — ED Provider Notes (Signed)
ATTENDING SUPERVISORY NOTE I have personally viewed the imaging studies performed. I have personally seen and examined the patient, and discussed the plan of care with the resident.  I have reviewed the documentation of the resident and agree.  No diagnosis found.  .Critical Care Performed by: Elnora Morrison, MD Authorized by: Elnora Morrison, MD   Critical care provider statement:    Critical care time (minutes):  35   Critical care start time:  02/14/2020 6:10 PM   Critical care end time:  02/14/2020 6:45 PM   Critical care time was exclusive of:  Separately billable procedures and treating other patients and teaching time   Critical care was necessary to treat or prevent imminent or life-threatening deterioration of the following conditions:  Respiratory failure   Critical care was time spent personally by me on the following activities:  Evaluation of patient's response to treatment, examination of patient, ordering and performing treatments and interventions, ordering and review of laboratory studies, ordering and review of radiographic studies, pulse oximetry, re-evaluation of patient's condition, obtaining history from patient or surrogate and review of old charts      Elnora Morrison, MD 02/15/20 2329

## 2020-02-14 NOTE — H&P (Addendum)
Date: 02/14/2020               Patient Name:  Jesus Russell MRN: [id number]  DOB: 09/02/1951 Age / Sex: 69 y.o., male   PCP: Jeanmarie Hubert, MD         Medical Service: Internal Medicine Teaching Service         Attending Physician: Dr. Elnora Morrison, MD    First Contact: Benjamine Mola, MD, [place] Pager: [phone])  Second Contact: Maricela Bo, MD, [place] Pager: Tracie Harrier [phone])       After Hours (After 5p/  First Contact Pager: [phone]  weekends / holidays): Second Contact Pager: [phone]   Chief Complaint: Left lower chest wall pain  History of Present Illness: Jesus Russell is a 69 year old male with a past medical history significant for positive lupus anticoagulant, PE (2012), DVT, IVC filter, COVID-19 infection on 1/13, HTN, hypertension, and sarcoidosis who presented to the ED with left lower chest/left upper quadrant abdominal pain. The pain started yesterday, he describes it as a 10/10 sharp, burning sensation that worsens with inspiration and is relieved by resting. The patient stated he had not had a bowel movement in 3 days, and thought he may have been constipated so he took some laxatives, which led to bowel movements, but did not relieve his pain.  Of note, patient stopped taking his Coumadin 4 days ago because he started taking naproxen for his back pain and was concerned for increased risk of bleeding. Patient's pain is well controlled at this time. He endorses lower extremity swelling that has increased over the last several days. He denies having any fevers headaches, vision changes, increased SOB (SOB at baseline from sarcoidosis), nausea, vomiting, or changes in bowel or bladder habits.  In the ED, a CBC, CMP, lipase were obtained.  Patient had leukocytosis to 12.7.  CMP unremarkable.  INR subtherapeutic at 1.2.  Received a CT angio of the chest which demonstrated PE with a combination of occlusive and nonocclusive embolus in the lower lungs bilaterally ranging from  segmental to subsegmental size vessels. CT abdomen showed diverticulosis with no evidence of diverticulitis, cholelithiasis without evidence of cholecystitis, and subacute fracture of the T8 vertebrae.  Meds:  Current Meds  Medication Sig  . BREO ELLIPTA 200-25 MCG/INH AEPB INHALE 1 PUFF INTO THE LUNGS DAILY (Patient taking differently: Inhale 1 puff into the lungs daily. )  . cyclobenzaprine (FLEXERIL) 5 MG tablet Take 1 tablet (5 mg total) by mouth 3 (three) times daily as needed for muscle spasms.  Marland Kitchen losartan-hydrochlorothiazide (HYZAAR) 50-12.5 MG tablet Take 1 tablet by mouth daily.  . naproxen (NAPROSYN) 500 MG tablet Take 1 tablet (500 mg total) by mouth 2 (two) times daily with a meal for 14 days. (Patient taking differently: Take 500 mg by mouth 2 (two) times daily as needed (for pain). )  . omeprazole (PRILOSEC) 40 MG capsule Take 1 capsule (40 mg total) by mouth daily. (Patient taking differently: Take 40 mg by mouth daily before breakfast. )  . OXYGEN Inhale 2 L/min into the lungs at bedtime.  . sulfamethoxazole-trimethoprim (BACTRIM) 400-80 MG tablet Take 1 tablet by mouth 3 (three) times a week.    Allergies: Allergies as of 02/14/2020  . (No Known Allergies)   Past Medical History:  Diagnosis Date  . DVT of lower extremity, bilateral ([place])   . Hypertension   . Incarcerated umbilical hernia 37/1062   Status post repair  . Lupus anticoagulant positive    Per records from  [facility]  . 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 Pulmonary artery hypertension ([place]) 04/2011   Mildly elevated pulmonary artery pressure in setting of PE (from [facility])  . Pulmonary embolism ([place]) 04/2011  . Sarcoidosis 1989  . Subdural hematoma ([facility])    Remote episode, occurred when taking excedrin and warfarin.    Past Surgical History:  Procedure Laterality Date  . BRAIN SURGERY    . HERNIA REPAIR     Family History:    Family History  Adopted: Yes    Social History:  Social History   Tobacco Use  . Smoking status: Former Smoker    Packs/day: 1.50    Years: 23.00    Pack years: 34.50    Types: Cigarettes    Quit date: 1995    Years since quitting: 26.1  . Smokeless tobacco: Never Used  Substance Use Topics  . Alcohol use: Never  . Drug use: Not Currently    Types: "Crack" cocaine    Comment: quit 2008    Review of Systems: A complete ROS was negative except as per HPI.   Imaging:  EKG: personally reviewed my interpretation is left bundle branch block with T wave inversions in V1 and V2, similar to previous EKGs.  No signs of acute ischemic process  CXR:  IMPRESSION: 1.  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 2.  No evidence of acute cardiopulmonary disease. 3.  Mild to moderate compression fracture deformities of 4 mid/lower thoracic vertebral bodies, new/progressive from October 2020.  CT angio chest: IMPRESSION: 1. Study is positive for pulmonary embolism with a combination of occlusive and nonocclusive embolus in the lower lungs bilaterally ranging from segmental to subsegmental sized vessels. 2. Aortic atherosclerosis, in addition to left main and 3 vessel  coronary artery disease. Please note that although the presence of coronary artery calcium documents the presence of coronary artery disease, the severity of this disease and any potential stenosis cannot be assessed on this non-gated CT examination. Assessment for potential risk factor modification, dietary therapy or pharmacologic therapy may be warranted, if clinically indicated. 3. There are calcifications of the aortic valve. Echocardiographic correlation for evaluation of potential valvular dysfunction may be warranted if clinically indicated. 4. Imaging findings in the lungs compatible with reported clinical history of sarcoidosis, as above.  CT abdomen and pelvis: IMPRESSION: 1. No acute  findings are noted in the abdomen or pelvis to account for the patient's symptoms. 2. However, there is a probable subacute fracture of the T8 vertebral body, as discussed above. 3. Numerous colonic diverticulae are noted, without surrounding inflammatory changes to suggest an acute diverticulitis at this time. 4. Cholelithiasis without evidence of acute cholecystitis at this time. 5. Aortic atherosclerosis, in addition to left main and 3 vessel coronary artery disease. Please note that although the presence of coronary artery calcium documents the presence of coronary artery disease, the severity of this disease and any potential stenosis cannot be assessed on this non-gated CT examination. Assessment for potential risk factor modification, dietary therapy or pharmacologic therapy may be warranted, if clinically indicated. 6. There are calcifications of the aortic valve. Echocardiographic correlation for evaluation of potential valvular dysfunction may be warranted if clinically indicated. 7. Unusual appearance of the visualized lungs which could be seen in the setting of a systemic disease such as sarcoidosis.  Physical Exam: Blood pressure (!) 122/93, pulse 98, temperature (!) 97.4 F (  36.3 C), temperature source Oral, resp. rate 20, height _0  (1.753 m), weight 107.3 kg, SpO2 96 %.  Physical Exam Vitals reviewed.  Constitutional:      General: He is not in acute distress.    Appearance: He is well-developed. He is obese. He is ill-appearing and diaphoretic. He is not toxic-appearing.  HENT:     Head: Normocephalic and atraumatic.  Cardiovascular:     Rate and Rhythm: Normal rate and regular rhythm.     Heart sounds: Normal heart sounds. No murmur. No friction rub. No gallop.   Pulmonary:     Effort: Pulmonary effort is normal. No respiratory distress.     Breath sounds: Normal breath sounds. No wheezing or rales.     Comments: 2L [place] in place  Pleuritic L wall chest pain  with deep inspiration Chest:     Chest wall: No tenderness.  Abdominal:     General: Bowel sounds are normal. There is distension. There is no abdominal bruit. There are no signs of injury.     Palpations: Abdomen is soft.     Tenderness: There is no abdominal tenderness.  Musculoskeletal:        General: No tenderness.     Right lower leg: Edema (2+ pitting edema) present.     Left lower leg: Edema (2+ pitting edema) present.  Skin:    General: Skin is warm.  Neurological:     General: No focal deficit present.     Mental Status: He is alert and oriented to person, place, and time.  Psychiatric:        Mood and Affect: Mood normal.    Assessment & Plan by Problem: Active Problems:   Pulmonary embolism ([place])   COVID-19  In summary, Jesus Russell is a 69 year old male with a past medical history significant for positive lupus anticoagulant, PE (2012), DVT, IVC filter, COVID-19 infection 1/13, HTN, hypertension, and sarcoidosis who presented to the ED with left lower chest pain and was found to have bilateral occlusive and nonocclusive subsegmental PE on CT angio of the chest. This is in the context of discontinuing his warfarin 4 days ago.  #Pulmonary embolism #Hx PE & DVT #Left chest wall pain:  CT angio of the chest demonstrated bilateral occlusive and nonocclusive subsegmental PE.  Patient has a extensive history of clotting, as well as lupus anticoagulant. He stopped taking his warfarin or days ago due to concerns of bleeding risk with taking warfarin and naproxen at the same time.  -Heparin per pharmacy -Echocardiogram ordered -Supplemental O2 as needed, currently on 2 L [place] -Telemetry -Incentive spirometry -Strict I's and O's and daily weights -Tylenol 650 mg every 6 hours as needed  #Positive COVID-19 Screening: Patient intimally was given a dose of Decadron 26m and remdesivir 200 mg after initial COVID positive screen. However, it was discovered afterwards that the patient  tested positive for Covid back on 1/13. This current positive test is likely not a sign of active infection.  -D/c Decadron and remdesivir -D/c airborne precautions  #Bilateral lower extremity edema: Wt at the beginning of February was 227 pounds. Currently at 236 pounds.  No document history of heart failure in the past. Last echo was performed in 2017 and was normal. -IV Lasix 40 mg -Reassess volume status in a.m. -Echocardiogram as noted above  #Sarcoidosis: Patient is followed by pulmonology for management of sarcoidosis. Just completed prolong steroid taper a few days prior to this hospitalization. -Breo ellipta 1 puff daily -Bactrim  400-80 mg 1 tablet three times weekly -f/u with pulmonology in the outpatient setting  #T8 subacute fracture: Pt has been on a prolonged, several month prednisone taper for his underlying sarcoidosis that he finished a few days ago. Likely played a role in fx. -Flexeril 5 mg 3 times daily as needed -Vitamin D 1000 units daily, zinc gluconate 50 mg daily  #HTN: takes Losartan-HCTZ 33m-12mg combo pill at home -Hold for today  #FEN/GI -Diet: Regular -Fluids: None -Senokot tablet nightly as needed  #DVT prophylaxis -Heparin per pharmacy  #CODE STATUS: FULL  #Dispo: Admit patient to Inpatient with expected length of stay greater than 2 midnights.  Signed: AEarlene Plater MD Internal Medicine, PGY1 Pager: [phone] 02/14/2020,9:56 PM

## 2020-02-14 NOTE — ED Notes (Signed)
Patient transported to X-ray 

## 2020-02-14 NOTE — ED Notes (Addendum)
Pt continues on 2 L of O2. SpO2 maintains on 96%.   Denies any pain at this time. States his right lower chest hurts when taking in deep breaths.

## 2020-02-14 NOTE — ED Notes (Addendum)
Oxygen 88%, pt placed on 2L [place], oxygen now at 99%

## 2020-02-14 NOTE — ED Provider Notes (Signed)
[facility] EMERGENCY DEPARTMENT Provider Note   CSN: [id number] Arrival date & time: 02/14/20  1355     History Chief Complaint  Patient presents with  . Abdominal Pain    Jesus Russell is a 69 y.o. male.  The history is provided by the patient and medical records.  Abdominal Pain Pain location:  LUQ Pain radiates to:  Periumbilical region Pain severity:  Severe Onset quality:  Gradual Duration:  2 days Timing:  Constant Progression:  Unchanged Chronicity:  New Context: previous surgery   Context: not retching, not sick contacts and not trauma   Relieved by:  Nothing Worsened by:  Deep breathing and movement Associated symptoms: constipation   Associated symptoms: no chest pain, no cough, no diarrhea, no dysuria, no fever, no hematemesis, no hematochezia, no hematuria, no melena, no nausea and no vomiting   Risk factors: multiple surgeries        Past Medical History:  Diagnosis Date  . DVT of lower extremity, bilateral ([place])   . Hypertension   . Incarcerated umbilical hernia 48/2707   Status post repair  . Lupus anticoagulant positive    Per records from [facility]  . 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 Pulmonary artery hypertension ([place]) 04/2011   Mildly elevated pulmonary artery pressure in setting of PE (from [facility])  . Pulmonary embolism ([place]) 04/2011  . Sarcoidosis 1989  . Subdural hematoma ([facility])    Remote episode, occurred when taking excedrin and warfarin.     Patient Active Problem List   Diagnosis Date Noted  . Lower extremity edema 02/03/2020  . Hypoxia 01/25/2020  . Back pain 01/25/2020  . Acute respiratory failure with hypoxia ([place]) 01/24/2020  . Compression fracture of T6 vertebra ([place]) 01/20/2020  . Cubital tunnel syndrome on right 01/20/2020  . Osteoporosis 01/20/2020  . Hepatic steatosis 10/29/2019  . Cholelithiasis 10/29/2019  . Obstructive  sleep apnea 10/29/2019  . Need for pneumococcal vaccine 10/29/2019  . Hypertension 10/06/2019  . Colon cancer screening 10/06/2019  . Long term (current) use of anticoagulants 09/14/2019  . History of DVT (deep vein thrombosis) 09/14/2019  . History of pulmonary embolism 09/14/2019  . Pulmonary embolism on long-term anticoagulation therapy ([place]) 09/09/2019  . Sarcoidosis 09/09/2019    Past Surgical History:  Procedure Laterality Date  . BRAIN SURGERY    . HERNIA REPAIR         Family History  Adopted: Yes    Social History   Tobacco Use  . Smoking status: Former Smoker    Packs/day: 1.50    Years: 23.00    Pack years: 34.50    Types: Cigarettes    Quit date: 1995    Years since quitting: 26.1  . Smokeless tobacco: Never Used  Substance Use Topics  . Alcohol use: Never  . Drug use: Not Currently    Types: "Crack" cocaine    Comment: quit 2008    Home Medications Prior to Admission medications   Medication Sig Start Date End Date Taking? Authorizing Provider  alendronate (FOSAMAX) 70 MG tablet Take 1 tablet (70 mg total) by mouth every 7 (seven) days. Take with a full glass of water on an empty stomach. 01/26/20 01/25/21  Marianna Payment, MD  BREO ELLIPTA 200-25 MCG/INH AEPB INHALE 1 PUFF INTO THE LUNGS DAILY Patient taking differently: Inhale 1 puff into the lungs daily.  11/30/19   Jeanmarie Hubert,  MD  cholecalciferol (VITAMIN D3) 25 MCG (1000 UT) tablet Take 1,000 Units by mouth daily.    [provider]  cyclobenzaprine (FLEXERIL) 5 MG tablet Take 1 tablet (5 mg total) by mouth 3 (three) times daily as needed for muscle spasms. 01/29/20   Jeanmarie Hubert, MD  losartan-hydrochlorothiazide (HYZAAR) 50-12.5 MG tablet Take 1 tablet by mouth daily. 12/16/19   Jeanmarie Hubert, MD  Na Sulfate-K Sulfate-Mg Sulf (SUPREP BOWEL PREP KIT) 17.5-3.13-1.6 GM/177ML SOLN Take 1 kit by mouth as directed. Patient not taking: Reported on 12/14/2019 12/01/19   Thornton Park, MD    naproxen (NAPROSYN) 500 MG tablet Take 1 tablet (500 mg total) by mouth 2 (two) times daily with a meal for 14 days. 02/03/20 02/17/20  Jean Rosenthal, MD  omeprazole (PRILOSEC) 40 MG capsule Take 1 capsule (40 mg total) by mouth daily. 11/09/19   Spero Geralds, MD  predniSONE (DELTASONE) 10 MG tablet Take 30 mg for 2 months, then 20 mg for 2 months, then 10 mg for 2 months Patient not taking: Reported on 12/14/2019 11/18/19   Jeanmarie Hubert, MD  warfarin (COUMADIN) 4 MG tablet TAKE 1/2 OF YOUR 4 MG WARFARIN TABLET ON SUNDAYS, TUESDAYS, THURSDAYS AND SATURDAYS. ALL OTHER DAYS TAKE 1 TABLET Patient taking differently: Take 2-4 mg by mouth See admin instructions. TAKE 1/2 OF YOUR 4 MG WARFARIN TABLET ON SUNDAYS, TUESDAYS, THURSDAYS AND SATURDAYS. ALL OTHER DAYS TAKE 1 (4MG) TABLET 11/26/19   Jorene Guest B, RPH-CPP  zinc gluconate 50 MG tablet Take 50 mg by mouth daily.    [provider]    Allergies    Patient has no known allergies.  Review of Systems   Review of Systems  Constitutional: Negative for fever.  Respiratory: Negative for cough.   Cardiovascular: Negative for chest pain.  Gastrointestinal: Positive for abdominal pain and constipation. Negative for diarrhea, hematemesis, hematochezia, melena, nausea and vomiting.  Genitourinary: Negative for dysuria and hematuria.  All other systems reviewed and are negative.   Physical Exam Updated Vital Signs BP (!) 133/97   Pulse 94   Temp (!) 97.4 F (36.3 C) (Oral)   Resp (!) 27   Ht 5' 9" (1.753 m)   Wt 107.3 kg   SpO2 96%   BMI 34.93 kg/m   Physical Exam Vitals and nursing note reviewed.  Constitutional:      Appearance: He is well-developed. He is not toxic-appearing or diaphoretic.  HENT:     Head: Normocephalic and atraumatic.  Eyes:     Conjunctiva/sclera: Conjunctivae normal.  Cardiovascular:     Rate and Rhythm: Normal rate and regular rhythm.     Heart sounds: Normal heart sounds. No murmur.   Pulmonary:     Effort: Pulmonary effort is normal. No respiratory distress.     Breath sounds: Normal breath sounds.  Chest:     Chest wall: Tenderness (left lateral lower chest) present.  Abdominal:     Palpations: Abdomen is soft.     Tenderness: There is abdominal tenderness (mild) in the left upper quadrant. There is no guarding or rebound. Negative signs include Murphy's sign, Rovsing's sign and McBurney's sign.  Musculoskeletal:     Cervical back: Neck supple.  Skin:    General: Skin is warm and dry.  Neurological:     Mental Status: He is alert.  Psychiatric:        Mood and Affect: Mood normal.        Behavior: Behavior normal.  ED Results / Procedures / Treatments   Labs (all labs ordered are listed, but only abnormal results are displayed) Labs Reviewed  SARS CORONAVIRUS 2 (TAT 6-24 HRS) - Abnormal; Notable for the following components:      Result Value   SARS Coronavirus 2 POSITIVE (*)    All other components within normal limits  COMPREHENSIVE METABOLIC PANEL - Abnormal; Notable for the following components:   Calcium 8.6 (*)    Total Bilirubin 1.4 (*)    All other components within normal limits  CBC - Abnormal; Notable for the following components:   WBC 13.0 (*)    MCV 102.1 (*)    All other components within normal limits  CBC WITH DIFFERENTIAL/PLATELET - Abnormal; Notable for the following components:   WBC 12.7 (*)    MCV 101.8 (*)    Neutro Abs 9.2 (*)    Abs Immature Granulocytes 0.10 (*)    All other components within normal limits  PROTIME-INR - Abnormal; Notable for the following components:   Prothrombin Time 15.4 (*)    All other components within normal limits  HEPARIN LEVEL (UNFRACTIONATED) - Abnormal; Notable for the following components:   Heparin Unfractionated 0.94 (*)    All other components within normal limits  CBC WITH DIFFERENTIAL/PLATELET - Abnormal; Notable for the following components:   WBC 11.3 (*)    MCV 102.0 (*)     Neutro Abs 7.8 (*)    Abs Immature Granulocytes 0.08 (*)    All other components within normal limits  BASIC METABOLIC PANEL - Abnormal; Notable for the following components:   Potassium 3.3 (*)    Glucose, Bld 118 (*)    Calcium 8.4 (*)    All other components within normal limits  LACTATE DEHYDROGENASE - Abnormal; Notable for the following components:   LDH 241 (*)    All other components within normal limits  D-DIMER, QUANTITATIVE (NOT AT [facility]) - Abnormal; Notable for the following components:   D-Dimer, Quant >20.00 (*)    All other components within normal limits  C-REACTIVE PROTEIN - Abnormal; Notable for the following components:   CRP 1.0 (*)    All other components within normal limits  HEPARIN LEVEL (UNFRACTIONATED) - Abnormal; Notable for the following components:   Heparin Unfractionated 0.88 (*)    All other components within normal limits  TROPONIN I (HIGH SENSITIVITY) - Abnormal; Notable for the following components:   Troponin I (High Sensitivity) 27 (*)    All other components within normal limits  LIPASE, BLOOD  APTT  MAGNESIUM  FIBRINOGEN  FERRITIN  TRIGLYCERIDES  PROCALCITONIN  CBC  HEPARIN LEVEL (UNFRACTIONATED)    EKG EKG Interpretation  Date/Time:  Sunday February 14 2020 21:34:34 EST Ventricular Rate:  94 PR Interval:    QRS Duration: 136 QT Interval:  362 QTC Calculation: 453 R Axis:   -141 Text Interpretation: Sinus rhythm Right bundle branch block Confirmed by Ripley Fraise [phone]) on 02/15/2020 8:06:39 AM   Radiology DG Chest 2 View  Result Date: 02/14/2020 CLINICAL DATA:  Left-sided abdominal pain EXAM: CHEST - 2 VIEW COMPARISON:  01/24/2020 and 10/06/2019 FINDINGS: Chronic interstitial opacities in the lungs bilaterally, including in the right perihilar region. Left upper hilar retraction. 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 No superimposed focal  consolidation. No pleural effusion or pneumothorax. Mild cardiomegaly. Degenerative changes of the visualized thoracolumbar spine. Mild to moderate compression fracture deformities of 4 mid/lower  thoracic vertebral bodies, new/progressive from October 2020. 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 No evidence of acute cardiopulmonary disease. Mild to moderate compression fracture deformities of 4 mid/lower thoracic vertebral bodies, new/progressive from October 2020. Electronically Signed   By: Julian Hy M.D.   On: 02/14/2020 15:33   CT Angio Chest PE W and/or Wo Contrast  Result Date: 02/14/2020 CLINICAL DATA:  69 year old male with history of shortness of breath. EXAM: CT ANGIOGRAPHY CHEST WITH CONTRAST TECHNIQUE: Multidetector CT imaging of the chest was performed using the standard protocol during bolus administration of intravenous contrast. Multiplanar CT image reconstructions and MIPs were obtained to evaluate the vascular anatomy. CONTRAST:  71m OMNIPAQUE IOHEXOL 350 MG/ML SOLN COMPARISON:  No priors. FINDINGS: Cardiovascular: There are numerous filling defects within pulmonary artery branches bilaterally, compatible with widespread pulmonary embolism. This includes segmental and subsegmental sized branches, the majority of which are nonocclusive, however, some occlusive embolus in the right lower lobe and left lower lobe is noted. Heart size is normal. There is no significant pericardial fluid, thickening or pericardial calcification. There is aortic atherosclerosis, as well as atherosclerosis of the great vessels of the mediastinum and the coronary arteries, including calcified atherosclerotic plaque in the left anterior descending, left circumflex and right coronary arteries. Calcifications of the aortic valve. Mediastinum/Nodes: No pathologically enlarged mediastinal or hilar lymph nodes. Esophagus is unremarkable in appearance. No axillary lymphadenopathy.  Lungs/Pleura: Extensive architectural distortion is noted throughout the lungs bilaterally, with widespread areas of cylindrical and varicose bronchiectasis, thickening of the peribronchovascular interstitium, chronic volume loss and upward retraction of hilar structures, compatible with reported clinical history of sarcoidosis. Widespread air trapping is also noted, most severe throughout the right middle and lower lobes. No acute consolidative airspace disease. No pleural effusions. No definite suspicious appearing pulmonary nodules or masses are noted. Upper Abdomen: Please see dictation for contemporaneously obtained CT the abdomen and pelvis for full description of relevant findings below the diaphragm. Musculoskeletal: There are no aggressive appearing lytic or blastic lesions noted in the visualized portions of the skeleton. Chronic appearing compression fractures of T6, T8, T9 and T10 are noted, most severe at T8 where there is 60% loss of anterior vertebral body height. Review of the MIP images confirms the above findings. IMPRESSION: 1. Study is positive for pulmonary embolism with a combination of occlusive and nonocclusive embolus in the lower lungs bilaterally ranging from segmental to subsegmental sized vessels. 2. Aortic atherosclerosis, in addition to left main and 3 vessel coronary artery disease. Please note that although the presence of coronary artery calcium documents the presence of coronary artery disease, the severity of this disease and any potential stenosis cannot be assessed on this non-gated CT examination. Assessment for potential risk factor modification, dietary therapy or pharmacologic therapy may be warranted, if clinically indicated. 3. There are calcifications of the aortic valve. Echocardiographic correlation for evaluation of potential valvular dysfunction may be warranted if clinically indicated. 4. Imaging findings in the lungs compatible with reported clinical history of  sarcoidosis, as above. Critical Value/emergent results were called by telephone at the time of interpretation on 02/14/2020 at 6:40 pm to [facility] who verbally acknowledged these results. Aortic Atherosclerosis (ICD10-I70.0). Electronically Signed   By: DVinnie LangtonM.D.   On: 02/14/2020 18:41   CT ABDOMEN PELVIS W CONTRAST  Result Date: 02/14/2020 CLINICAL DATA:  69year old male with history of left upper quadrant abdominal pain. Left-sided flank pain. EXAM: CT ABDOMEN AND PELVIS WITH CONTRAST TECHNIQUE: Multidetector CT  imaging of the abdomen and pelvis was performed using the standard protocol following bolus administration of intravenous contrast. CONTRAST:  154m OMNIPAQUE IOHEXOL 300 MG/ML  SOLN COMPARISON:  No priors. FINDINGS: Lower chest: Areas of bronchiectasis and scarring in the lung bases bilaterally. Atherosclerotic calcifications in the left main, left anterior descending, left circumflex and right coronary arteries. Calcifications of the aortic valve. Hepatobiliary: No suspicious cystic or solid hepatic lesions. No intra or extrahepatic biliary ductal dilatation. Multiple peripherally calcified gallstones are noted in the gallbladder. Gallbladder is contracted around these gallstones. No findings to suggest an acute cholecystitis at this time. Pancreas: No pancreatic mass. No pancreatic ductal dilatation. No pancreatic or peripancreatic fluid collections or inflammatory changes. Spleen: Unremarkable. Adrenals/Urinary Tract: Bilateral kidneys and adrenal glands are normal in appearance. No hydroureteronephrosis. Urinary bladder is normal in appearance. Stomach/Bowel: Normal appearance of the stomach. No pathologic dilatation of small bowel or colon. Numerous colonic diverticulae are noted, without surrounding inflammatory changes to suggest an acute diverticulitis at this time. Normal appendix. Vascular/Lymphatic: Aortic atherosclerosis with aneurysmal dilatation of the right  common iliac artery which measures up to 2 cm in diameter. IVC filter in place with tip immediately below the level of the renal veins. No lymphadenopathy noted in the abdomen or pelvis. Reproductive: Prostate gland and seminal vesicles are unremarkable in appearance. Other: No significant volume of ascites.  No pneumoperitoneum. Musculoskeletal: Compression fractures are noted at T8 and T10. At T8 this is most severe where there is 50% loss of anterior vertebral body height and faintly visualized fracture lines, suggesting a subacute injury (no paravertebral soft tissue swelling is noted to suggest an acute injury). There are no aggressive appearing lytic or blastic lesions noted in the visualized portions of the skeleton. IMPRESSION: 1. No acute findings are noted in the abdomen or pelvis to account for the patient's symptoms. 2. However, there is a probable subacute fracture of the T8 vertebral body, as discussed above. 3. Numerous colonic diverticulae are noted, without surrounding inflammatory changes to suggest an acute diverticulitis at this time. 4. Cholelithiasis without evidence of acute cholecystitis at this time. 5. Aortic atherosclerosis, in addition to left main and 3 vessel coronary artery disease. Please note that although the presence of coronary artery calcium documents the presence of coronary artery disease, the severity of this disease and any potential stenosis cannot be assessed on this non-gated CT examination. Assessment for potential risk factor modification, dietary therapy or pharmacologic therapy may be warranted, if clinically indicated. 6. There are calcifications of the aortic valve. Echocardiographic correlation for evaluation of potential valvular dysfunction may be warranted if clinically indicated. 7. Unusual appearance of the visualized lungs which could be seen in the setting of a systemic disease such as sarcoidosis. Electronically Signed   By: DVinnie LangtonM.D.   On:  02/14/2020 16:00    Procedures Procedures (including critical care time)    ED Course  I have reviewed the triage vital signs and the nursing notes.  Pertinent labs & imaging results that were available during my care of the patient were reviewed by me and considered in my medical decision making (see chart for details).    MDM Rules/Calculators/A&P                      C89year-old male with past medical history of DVT on Coumadin, sarcoidosis with chronic hypoxic respiratory failure who presents to the ED for left upper quadrant/left lower chest pain.  CT abdomen pelvis with  subacute T8 fracture, CTA chest with evidence of PE's.  The pulmonary embolisms are likely source of his pain as his pain is pleuritic and he has not been taking his Coumadin for the last 4 days as he reports he was told that it is the same thing as his naproxen which he has been using for his back pain from previous fractures.  Pharmacy consulted for heparin infusion.  Internal medicine residency program consulted for admission.  Patient admitted to the floor in stable condition.   Final Clinical Impression(s) / ED Diagnoses Final diagnoses:  Acute pulmonary embolism, unspecified pulmonary embolism type, unspecified whether acute cor pulmonale present [facility])    Rx / [place] Orders ED Discharge Orders    None       Baljit Liebert, [place], MD 02/15/20 1448    Elnora Morrison, MD 02/15/20 2330

## 2020-02-14 NOTE — Progress Notes (Signed)
ANTICOAGULATION CONSULT NOTE - Initial Consult  Pharmacy Consult for heparin Indication: pulmonary embolus  No Known Allergies  Patient Measurements: Height: 5' 9" (175.3 cm) Weight: 236 lb 8 oz (107.3 kg) IBW/kg (Calculated) : 70.7 Heparin Dosing Weight: 94kg  Vital Signs: Temp: 97.4 F (36.3 C) (02/21 1401) Temp Source: Oral (02/21 1401) BP: 137/101 (02/21 1830) Pulse Rate: 100 (02/21 1830)  Labs: Recent Labs    02/14/20 1412 02/14/20 1458  HGB 15.7  15.7  --   HCT 49.6  49.4  --   PLT 174  167  --   LABPROT  --  15.4*  INR  --  1.2  CREATININE 0.86  --     Estimated Creatinine Clearance: 101.9 mL/min (by C-G formula based on SCr of 0.86 mg/dL).   Medical History: Past Medical History:  Diagnosis Date  . DVT of lower extremity, bilateral ([place])   . Hypertension   . Incarcerated umbilical hernia 58/4465   Status post repair  . Lupus anticoagulant positive    Per records from [facility]  . 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 Pulmonary artery hypertension ([place]) 04/2011   Mildly elevated pulmonary artery pressure in setting of PE (from [facility])  . Pulmonary embolism ([place]) 04/2011  . Sarcoidosis 1989  . Subdural hematoma ([facility])    Remote episode, occurred when taking excedrin and warfarin.    Assessment: 20 YOM presenting with abdominal pain/SOB, hx COVID infection in January, now with bilateral PE on CT.  On warfarin PTA for hx VTE, INR 1.2 on admission.  CBC wnl.      Goal of Therapy:  Heparin level 0.3-0.7 units/ml Monitor platelets by anticoagulation protocol: Yes   Plan:  Heparin 5000 units IV x 1, and gtt at 1500 units/hr F/u heparin level in 6 hours AC plan and warfarin restart  Bertis Ruddy, PharmD Clinical Pharmacist Please check AMION for all [place] numbers 02/14/2020 7:16 PM

## 2020-02-14 NOTE — ED Triage Notes (Signed)
Pt arrives via EMS from home with reports of left sided abd pain since yesterday that is worse with movement. Endorses chronic back pain. Hx of 3 hernias. 89% on RA.

## 2020-02-15 ENCOUNTER — Inpatient Hospital Stay (HOSPITAL_COMMUNITY): Payer: Medicare Other

## 2020-02-15 DIAGNOSIS — M4854XA Collapsed vertebra, not elsewhere classified, thoracic region, initial encounter for fracture: Secondary | ICD-10-CM | POA: Diagnosis present

## 2020-02-15 DIAGNOSIS — Z7952 Long term (current) use of systemic steroids: Secondary | ICD-10-CM | POA: Diagnosis not present

## 2020-02-15 DIAGNOSIS — J9621 Acute and chronic respiratory failure with hypoxia: Secondary | ICD-10-CM | POA: Diagnosis not present

## 2020-02-15 DIAGNOSIS — X58XXXD Exposure to other specified factors, subsequent encounter: Secondary | ICD-10-CM

## 2020-02-15 DIAGNOSIS — R570 Cardiogenic shock: Secondary | ICD-10-CM | POA: Diagnosis not present

## 2020-02-15 DIAGNOSIS — I2609 Other pulmonary embolism with acute cor pulmonale: Secondary | ICD-10-CM

## 2020-02-15 DIAGNOSIS — G9341 Metabolic encephalopathy: Secondary | ICD-10-CM | POA: Diagnosis not present

## 2020-02-15 DIAGNOSIS — Z87891 Personal history of nicotine dependence: Secondary | ICD-10-CM | POA: Diagnosis not present

## 2020-02-15 DIAGNOSIS — Z7901 Long term (current) use of anticoagulants: Secondary | ICD-10-CM | POA: Diagnosis not present

## 2020-02-15 DIAGNOSIS — Z8616 Personal history of COVID-19: Secondary | ICD-10-CM | POA: Diagnosis not present

## 2020-02-15 DIAGNOSIS — I110 Hypertensive heart disease with heart failure: Secondary | ICD-10-CM

## 2020-02-15 DIAGNOSIS — S22069D Unspecified fracture of T7-T8 vertebra, subsequent encounter for fracture with routine healing: Secondary | ICD-10-CM

## 2020-02-15 DIAGNOSIS — I82442 Acute embolism and thrombosis of left tibial vein: Secondary | ICD-10-CM | POA: Diagnosis present

## 2020-02-15 DIAGNOSIS — D6861 Antiphospholipid syndrome: Secondary | ICD-10-CM

## 2020-02-15 DIAGNOSIS — D539 Nutritional anemia, unspecified: Secondary | ICD-10-CM | POA: Diagnosis present

## 2020-02-15 DIAGNOSIS — D860 Sarcoidosis of lung: Secondary | ICD-10-CM | POA: Diagnosis present

## 2020-02-15 DIAGNOSIS — E8770 Fluid overload, unspecified: Secondary | ICD-10-CM | POA: Diagnosis not present

## 2020-02-15 DIAGNOSIS — I5030 Unspecified diastolic (congestive) heart failure: Secondary | ICD-10-CM

## 2020-02-15 DIAGNOSIS — E870 Hyperosmolality and hypernatremia: Secondary | ICD-10-CM | POA: Diagnosis present

## 2020-02-15 DIAGNOSIS — J9601 Acute respiratory failure with hypoxia: Secondary | ICD-10-CM | POA: Diagnosis not present

## 2020-02-15 DIAGNOSIS — Z86711 Personal history of pulmonary embolism: Secondary | ICD-10-CM | POA: Diagnosis not present

## 2020-02-15 DIAGNOSIS — Z86718 Personal history of other venous thrombosis and embolism: Secondary | ICD-10-CM

## 2020-02-15 DIAGNOSIS — I2694 Multiple subsegmental pulmonary emboli without acute cor pulmonale: Secondary | ICD-10-CM

## 2020-02-15 DIAGNOSIS — D869 Sarcoidosis, unspecified: Secondary | ICD-10-CM | POA: Diagnosis not present

## 2020-02-15 DIAGNOSIS — R040 Epistaxis: Secondary | ICD-10-CM | POA: Diagnosis not present

## 2020-02-15 DIAGNOSIS — E669 Obesity, unspecified: Secondary | ICD-10-CM | POA: Diagnosis present

## 2020-02-15 DIAGNOSIS — J9600 Acute respiratory failure, unspecified whether with hypoxia or hypercapnia: Secondary | ICD-10-CM | POA: Diagnosis not present

## 2020-02-15 DIAGNOSIS — Z452 Encounter for adjustment and management of vascular access device: Secondary | ICD-10-CM | POA: Diagnosis not present

## 2020-02-15 DIAGNOSIS — R1012 Left upper quadrant pain: Secondary | ICD-10-CM | POA: Diagnosis not present

## 2020-02-15 DIAGNOSIS — Z7951 Long term (current) use of inhaled steroids: Secondary | ICD-10-CM

## 2020-02-15 DIAGNOSIS — I2699 Other pulmonary embolism without acute cor pulmonale: Secondary | ICD-10-CM | POA: Diagnosis not present

## 2020-02-15 DIAGNOSIS — I10 Essential (primary) hypertension: Secondary | ICD-10-CM | POA: Diagnosis not present

## 2020-02-15 DIAGNOSIS — S22050K Wedge compression fracture of T5-T6 vertebra, subsequent encounter for fracture with nonunion: Secondary | ICD-10-CM | POA: Diagnosis not present

## 2020-02-15 DIAGNOSIS — R042 Hemoptysis: Secondary | ICD-10-CM | POA: Diagnosis not present

## 2020-02-15 DIAGNOSIS — E162 Hypoglycemia, unspecified: Secondary | ICD-10-CM | POA: Diagnosis present

## 2020-02-15 DIAGNOSIS — Z79899 Other long term (current) drug therapy: Secondary | ICD-10-CM

## 2020-02-15 DIAGNOSIS — U071 COVID-19: Secondary | ICD-10-CM | POA: Insufficient documentation

## 2020-02-15 DIAGNOSIS — Z791 Long term (current) use of non-steroidal anti-inflammatories (NSAID): Secondary | ICD-10-CM

## 2020-02-15 DIAGNOSIS — Z6833 Body mass index (BMI) 33.0-33.9, adult: Secondary | ICD-10-CM | POA: Diagnosis not present

## 2020-02-15 DIAGNOSIS — Z9114 Patient's other noncompliance with medication regimen: Secondary | ICD-10-CM

## 2020-02-15 DIAGNOSIS — N179 Acute kidney failure, unspecified: Secondary | ICD-10-CM | POA: Diagnosis not present

## 2020-02-15 DIAGNOSIS — R579 Shock, unspecified: Secondary | ICD-10-CM | POA: Diagnosis not present

## 2020-02-15 DIAGNOSIS — J9382 Other air leak: Secondary | ICD-10-CM | POA: Diagnosis not present

## 2020-02-15 DIAGNOSIS — M7989 Other specified soft tissue disorders: Secondary | ICD-10-CM | POA: Diagnosis not present

## 2020-02-15 DIAGNOSIS — N170 Acute kidney failure with tubular necrosis: Secondary | ICD-10-CM | POA: Diagnosis not present

## 2020-02-15 LAB — MAGNESIUM: Magnesium: 1.7 mg/dL (ref 1.7–2.4)

## 2020-02-15 LAB — CBC WITH DIFFERENTIAL/PLATELET
Abs Immature Granulocytes: 0.08 10*3/uL — ABNORMAL HIGH (ref 0.00–0.07)
Basophils Absolute: 0.1 10*3/uL (ref 0.0–0.1)
Basophils Relative: 1 %
Eosinophils Absolute: 0.4 10*3/uL (ref 0.0–0.5)
Eosinophils Relative: 4 %
HCT: 46.2 % (ref 39.0–52.0)
Hemoglobin: 14.6 g/dL (ref 13.0–17.0)
Immature Granulocytes: 1 %
Lymphocytes Relative: 18 %
Lymphs Abs: 2 10*3/uL (ref 0.7–4.0)
MCH: 32.2 pg (ref 26.0–34.0)
MCHC: 31.6 g/dL (ref 30.0–36.0)
MCV: 102 fL — ABNORMAL HIGH (ref 80.0–100.0)
Monocytes Absolute: 0.9 10*3/uL (ref 0.1–1.0)
Monocytes Relative: 8 %
Neutro Abs: 7.8 10*3/uL — ABNORMAL HIGH (ref 1.7–7.7)
Neutrophils Relative %: 68 %
Platelets: 172 10*3/uL (ref 150–400)
RBC: 4.53 MIL/uL (ref 4.22–5.81)
RDW: 14.8 % (ref 11.5–15.5)
WBC: 11.3 10*3/uL — ABNORMAL HIGH (ref 4.0–10.5)
nRBC: 0 % (ref 0.0–0.2)

## 2020-02-15 LAB — BASIC METABOLIC PANEL
Anion gap: 14 (ref 5–15)
BUN: 14 mg/dL (ref 8–23)
CO2: 28 mmol/L (ref 22–32)
Calcium: 8.4 mg/dL — ABNORMAL LOW (ref 8.9–10.3)
Chloride: 100 mmol/L (ref 98–111)
Creatinine, Ser: 0.76 mg/dL (ref 0.61–1.24)
GFR calc Af Amer: >60 mL/min (ref >60)
GFR calc non Af Amer: >60 mL/min (ref >60)
Glucose, Bld: 118 mg/dL — ABNORMAL HIGH (ref 70–99)
Potassium: 3.3 mmol/L — ABNORMAL LOW (ref 3.5–5.1)
Sodium: 142 mmol/L (ref 135–145)

## 2020-02-15 LAB — FERRITIN: Ferritin: 102 ng/mL (ref 24–336)

## 2020-02-15 LAB — CBC
HCT: 46.1 % (ref 39.0–52.0)
Hemoglobin: 14.5 g/dL (ref 13.0–17.0)
MCH: 32 pg (ref 26.0–34.0)
MCHC: 31.5 g/dL (ref 30.0–36.0)
MCV: 101.8 fL — ABNORMAL HIGH (ref 80.0–100.0)
Platelets: 182 10*3/uL (ref 150–400)
RBC: 4.53 MIL/uL (ref 4.22–5.81)
RDW: 14.6 % (ref 11.5–15.5)
WBC: 12.9 10*3/uL — ABNORMAL HIGH (ref 4.0–10.5)
nRBC: 0 % (ref 0.0–0.2)

## 2020-02-15 LAB — HEPARIN LEVEL (UNFRACTIONATED)
Heparin Unfractionated: 0.94 IU/mL — ABNORMAL HIGH (ref 0.30–0.70)
Heparin Unfractionated: 0.88 IU/mL — ABNORMAL HIGH (ref 0.30–0.70)
Heparin Unfractionated: 0.75 IU/mL — ABNORMAL HIGH (ref 0.30–0.70)

## 2020-02-15 LAB — ECHOCARDIOGRAM LIMITED
Height: 68 in
Weight: 3612.02 oz

## 2020-02-15 LAB — C-REACTIVE PROTEIN: CRP: 1 mg/dL — ABNORMAL HIGH (ref <1.0)

## 2020-02-15 LAB — TROPONIN I (HIGH SENSITIVITY): Troponin I (High Sensitivity): 27 ng/L — ABNORMAL HIGH (ref <18)

## 2020-02-15 LAB — TRIGLYCERIDES: Triglycerides: 85 mg/dL (ref <150)

## 2020-02-15 LAB — PROCALCITONIN: Procalcitonin: <0.1 ng/mL

## 2020-02-15 LAB — LACTATE DEHYDROGENASE: LDH: 241 U/L — ABNORMAL HIGH (ref 98–192)

## 2020-02-15 LAB — FIBRINOGEN: Fibrinogen: 245 mg/dL (ref 210–475)

## 2020-02-15 LAB — D-DIMER, QUANTITATIVE: D-Dimer, Quant: >20 ug/mL-FEU — ABNORMAL HIGH (ref 0.00–0.50)

## 2020-02-15 MED ORDER — SULFAMETHOXAZOLE-TRIMETHOPRIM 400-80 MG PO TABS
1.0000 | ORAL_TABLET | ORAL | Status: DC
  Administered 2020-02-15: 1 via ORAL
  Filled 2020-02-15: qty 1

## 2020-02-15 MED ORDER — ALBUTEROL SULFATE HFA 108 (90 BASE) MCG/ACT IN AERS
2.0000 | INHALATION_SPRAY | RESPIRATORY_TRACT | Status: DC | PRN
  Filled 2020-02-15: qty 6.7

## 2020-02-15 MED ORDER — POTASSIUM CHLORIDE CRYS ER 20 MEQ PO TBCR
40.0000 meq | EXTENDED_RELEASE_TABLET | Freq: Once | ORAL | Status: AC
  Administered 2020-02-15: 40 meq via ORAL
  Filled 2020-02-15: qty 2

## 2020-02-15 MED ORDER — DEXAMETHASONE 4 MG PO TABS
6.0000 mg | ORAL_TABLET | Freq: Every day | ORAL | Status: DC
  Administered 2020-02-15: 6 mg via ORAL

## 2020-02-15 MED ORDER — WARFARIN SODIUM 4 MG PO TABS
4.0000 mg | ORAL_TABLET | Freq: Once | ORAL | Status: AC
  Administered 2020-02-15: 4 mg via ORAL
  Filled 2020-02-15: qty 1

## 2020-02-15 MED ORDER — WARFARIN - PHARMACIST DOSING INPATIENT: Freq: Every day | Status: DC

## 2020-02-15 MED ORDER — OXYMETAZOLINE HCL 0.05 % NA SOLN
1.0000 | Freq: Once | NASAL | Status: AC
  Administered 2020-02-15: 1 via NASAL
  Filled 2020-02-15 (×2): qty 30

## 2020-02-15 MED ORDER — PANTOPRAZOLE SODIUM 40 MG PO TBEC
40.0000 mg | DELAYED_RELEASE_TABLET | Freq: Every day | ORAL | Status: DC
  Administered 2020-02-15: 40 mg via ORAL
  Filled 2020-02-15 (×2): qty 1

## 2020-02-15 MED ORDER — DEXAMETHASONE 4 MG PO TABS: 6.0000 mg | ORAL_TABLET | Freq: Every day | ORAL | Status: DC

## 2020-02-15 MED ORDER — HEPARIN (PORCINE) 25000 UT/250ML-% IV SOLN
1000.0000 [IU]/h | INTRAVENOUS | Status: DC
  Filled 2020-02-15: qty 250

## 2020-02-15 MED ORDER — FUROSEMIDE 10 MG/ML IJ SOLN
40.0000 mg | Freq: Once | INTRAMUSCULAR | Status: AC
  Administered 2020-02-15: 40 mg via INTRAVENOUS
  Filled 2020-02-15: qty 4

## 2020-02-15 NOTE — Significant Event (Signed)
Rapid Response Event Note  Overview: Time Called: 0518 Arrival Time: 0845 Event Type: Respiratory Increase in oxygen requirements from 2L [place] to 3L [place].  Pt admitted for PE, currently on Heparin gtt.   Initial Focused Assessment: Pt lying in bed, HOB >45 degrees. Pt AOx4. Lung sounds are clear, diminished. No adventitious heart sounds. Pt has no complaints. Bowel sounds faint, abdomen round/distended, soft. Oxygen saturation 94% on 3LNC. Edema 2+ in the left foot and 3+ in the right foot.   Interventions: - No intervention at this time by RR RN.   Plan of Care (if not transferred): - Recommend encouraging up to chair, if MD order allows. Recommend bowel regimen and treatment for lower extremity edema. RN to address recommendations with attending during rounds.  - Elevate feet while pt in bed.   RN to call RR RN for further needs.   Event Summary:  Outcome: Stayed in room and stabalized  Event End Time: [place]

## 2020-02-15 NOTE — Progress Notes (Signed)
  Subjective:  Patient seen at bedside. Patient states that he is breathing comfortably. Patient states his chest pain is improved but worse with movement.  Objective:   Vital Signs (last 24 hours): Vitals:   02/14/20 2030 02/14/20 2100 02/14/20 2203 02/14/20 2215  BP: 125/76 (!) 129/102  (!) 138/97  Pulse: (!) 103 96  98  Resp: _0 Temp:    97.6 F (36.4 C)  TempSrc:    Oral  SpO2: 96% 94%  95%  Weight:   102.4 kg   Height:   _1  (1.727 m)    Physical Exam: General Alert and answers questions appropriately, no acute distress  Cardiac Regular rate and rhythm, no murmurs, rubs, or gallops. JVD present  Pulmonary Mild bilateral wheezes, no rales, rhonchi, breathing comfortably on 2 L [place]  Abdominal Soft, non-tender, without distention    Assessment/Plan:   Active Problems:   Pulmonary embolism ([place])   COVID-19  Patient is a 69 year old male with past medical history significant for pulmonary sarcoidosis, positive lupus anticoagulant, pulmonary embolism, DVT, IVC filter, COVID-19 infection (on 1/13), and hypertension who presented to the ED on 02/15/2020 with left lower chest pain was found to have bilateral occlusive and nonocclusive subsegmental pulmonary embolism on CTA.  # Pulmonary embolism CTA of the chest demonstrated bilateral occlusive and nonocclusive subsegmental PE. Patient with extensive clotting history, lupus anticoagulant.  Pulmonary embolism likely secondary to self discontinuation of warfarin approximately 4 days ago -patient had concerns with taking medicine with naproxen.  Patient has been counseled on warfarin usage. * Heparin per pharmacy consult, currently supratherapeutic. Will restart warfarin and discontinue heparin once therapeutic * TTE with EF 87-86%, Grade I diastolic dysfunction, no evidence for tricuspid regurgitation * Continuous pulse oximetry * Incentive spirometry * Strict I/Os, daily weights  # HFpEF: TTE with grade 1 diastolic  dysfunction, patient with mild bilateral lower extremity edema. Received 40 mg IV lasix yesterday with 573 ml net urine output.  * IV lasix 40 mg x1 today * Strict I/Os, daily weights  # Sarcoidosis: Patient completed steroid taper. * Breo ellipta 1 puff daily * Bactrim 400-80 1 tablet three times weekly * F/u with pulmonology in outpatient setting  # T8 subacatue fracture: Continue flexeril 5 mg three times daily + vitamin D 1000 U daily + zinc gluconate 50 mg daily  # HTN: Continue losartan-HCTZ 50 mg-12.5 mg daily. Will continue to hold in setting of PE  PT/OT: Consulted Diet: Regular DVT Ppx: Therapeutic heparin + start warfarin. Will discontinue heparin when warfarin therapeutic Admit Status: Inpatient Dispo: Anticipated discharge in approximately 1-2 days  Jeanmarie Hubert, MD 02/15/2020, 6:49 AM

## 2020-02-15 NOTE — Progress Notes (Addendum)
Patient seen at the bedside after being paged for diffuse nosebleed.  Patient states that he feels better than he did before when I first came in to check on him.  States that his pain in his left side is still there, but it has improved since his nose started bleeding. Runny appearing blood was dripping from bilateral nostrils into the garbage bin. Dried blood was on the patient's chest and face.  Patient states that he has nosebleeds at home, for which he puts paper towels in his nose and resolves the bleeding.  Discussed the plan with the nurse to continue holding pressure for at least 5 to 10 minutes while the patient leans forward. to see if bleeding resolves.  If not, we will try using Afrin nasal spray and Rhino Rocket if needed.  The patient needs to continue heparin drip due to underlying pulmonary emboli.  Patient is in agreement with the plan and will we will continue to monitor.  Earlene Plater, MD Internal Medicine, PGY1 Pager: [phone]  02/16/2020,12:01 AM

## 2020-02-15 NOTE — Progress Notes (Signed)
ANTICOAGULATION CONSULT NOTE  Pharmacy Consult for heparin Indication: pulmonary embolus  No Known Allergies  Patient Measurements: Height: _0  (172.7 cm) Weight: 225 lb 12 oz (102.4 kg) IBW/kg (Calculated) : 68.4 Heparin Dosing Weight: 94 kg  Vital Signs: Temp: 97.7 F (36.5 C) (02/22 2020) Temp Source: Oral (02/22 2020) BP: 122/80 (02/22 2020) Pulse Rate: 93 (02/22 2020)  Labs: Recent Labs    02/14/20 1412 02/14/20 1412 02/14/20 1458 02/14/20 1900 02/15/20 0059 02/15/20 1038 02/15/20 2038  HGB 15.7  15.7   < >  --   --  14.6  --  14.5  HCT 49.6  49.4  --   --   --  46.2  --  46.1  PLT 174  167  --   --   --  172  --  182  APTT  --   --   --  32  --   --   --   LABPROT  --   --  15.4*  --   --   --   --   INR  --   --  1.2  --   --   --   --   HEPARINUNFRC  --   --   --   --  0.94* 0.88* 0.75*  CREATININE 0.86  --   --   --  0.76  --   --   TROPONINIHS  --   --   --   --  27*  --   --    < > = values in this interval not displayed.    Estimated Creatinine Clearance: 105.3 mL/min (by C-G formula based on SCr of 0.76 mg/dL).   Assessment: 69 yr old male presented with abdominal pain/SOB, hx COVID infection in January, now with bilateral PE on CT.  On warfarin PTA for hx VTE, INR 1.2 on admission.    Last INR outpatient check in INR was 6.7 - warfarin regimen was  changed to 4 mg on Mondays and 2 mg on all other days.  This evening, pt had hemoptysis. Provider examined pt and felt that hemoptysis was most likely due to PE (per pt, he has had hemoptysis with PE in the past). H/H done at that time were WNL, platelets also WNL (CBC stable). Provider discontinued warfarin (pt had already rec'd 4 mg dose this PM) and continued heparin. Heparin level drawn ~5.5 hrs after heparin infusion was decreased earlier today to 1100 units/hr was 0.75 units/ml, which is above the goal range for this pt. Per RN, no issues with IV, and pt no longer complaining of hemoptysis.  Goal  of Therapy:  Heparin level 0.3-0.7 units/ml Monitor platelets by anticoagulation protocol: Yes   Plan:  Hold heparin infusion for 30 minutes, then restart at 900 units/hr Check 6-hr heparin level after infusion is restarted Monitor daily heparin level, CBC Monitor for signs/symptoms of bleeding  Gillermina Hu, PharmD, BCPS, [facility] Clinical Pharmacist 02/15/2020 9:33 PM

## 2020-02-15 NOTE — Progress Notes (Addendum)
Patient seen at the bedside after being paged for left lower chest wall pain and hemoptysis.  Patient states that he started having hemoptysis this afternoon.  He states that he has experienced this with previous blood clots in his lungs.  At rest, he denies having any left lower chest wall pain, however whenever he attempts to turn on his sides, stand up or ambulate the pain is sharp and pointed in the same area.  He does not think the pain is necessarily better or worse from yesterday.  He is currently saturating in the low 90s with 2 L nasal cannula supplemental O2.  On exam, the patient appears comfortable while resting still.  He has diminished breath sounds, but has some diffuse wheezing in his bilateral lung fields.  No crackles appreciated.  Normal S1 and S2, no murmurs rubs or gallops appreciated.  I believe his chest pain is pleuritic in nature and likely secondary to the PE.  Hemoptysis is likely secondary to PE as well. However, given that the patient is on a heparin drip and Coumadin, I want to make sure he is not actively bleeding.    Plan:  -Stat CBC -Stat CXR  -D/c Coumadin, continue heparin for now  ADDENDUM: CXR demonstrated findings consisent with pulmonary sarcoidosis. Hbg 12.9 on CBC Will continue to monitor.  Earlene Plater, MD Internal Medicine, PGY1 Pager: [phone]  02/15/2020,11:57 PM

## 2020-02-15 NOTE — Progress Notes (Signed)
Patient complaining of severe pain LUQ during movement. Patient states that it hurts so bad he cannot breathe. Also noted that patient is coughing up what appears to be blood in a napkin. Patient stated that he had been coughing up blood all evening.   MD notified of condition change and responded by coming to bedside. See MD orders. RRT and Charge RN also at bedside.   Patient currently resting in bed comfortably,  will continue to monitor.

## 2020-02-15 NOTE — Care Management (Signed)
CM acknowledges consult for Lovenox at discharge.  CM will need the following to submit benefit check:  Dose, frequency and duration.  CM informed attending group - group to discuss and follow up with CM.

## 2020-02-15 NOTE — Progress Notes (Signed)
ANTICOAGULATION CONSULT NOTE  Pharmacy Consult for heparin Indication: pulmonary embolus  No Known Allergies  Patient Measurements: Height: 5' 8" (172.7 cm) Weight: 225 lb 12 oz (102.4 kg) IBW/kg (Calculated) : 68.4 Heparin Dosing Weight: 94kg  Vital Signs: Temp: 97.6 F (36.4 C) (02/21 2215) Temp Source: Oral (02/21 2215) BP: 138/97 (02/21 2215) Pulse Rate: 98 (02/21 2215)  Labs: Recent Labs    02/14/20 1412 02/14/20 1458 02/14/20 1900 02/15/20 0059  HGB 15.7  15.7  --   --  14.6  HCT 49.6  49.4  --   --  46.2  PLT 174  167  --   --  172  APTT  --   --  32  --   LABPROT  --  15.4*  --   --   INR  --  1.2  --   --   HEPARINUNFRC  --   --   --  0.94*  CREATININE 0.86  --   --   --   TROPONINIHS  --   --   --  27*    Estimated Creatinine Clearance: 98 mL/min (by C-G formula based on SCr of 0.86 mg/dL).   Assessment: 66 YOM presenting with abdominal pain/SOB, hx COVID infection in January, now with bilateral PE on CT.  On warfarin PTA for hx VTE, INR 1.2 on admission.  CBC wnl.      Heparin level supratherapeutic (0.94) on gtt at 1500 units/hr. No bleeding noted.  Goal of Therapy:  Heparin level 0.3-0.7 units/ml Monitor platelets by anticoagulation protocol: Yes   Plan:  Decrease heparin to 1300 units/hr F/u heparin level in 6 hours  Sherlon Handing, PharmD, BCPS Please see amion for complete clinical pharmacist phone list 02/15/2020 3:44 AM

## 2020-02-15 NOTE — Progress Notes (Signed)
ANTICOAGULATION CONSULT NOTE  Pharmacy Consult for heparin Indication: pulmonary embolus  No Known Allergies  Patient Measurements: Height: 5' 8" (172.7 cm) Weight: 225 lb 12 oz (102.4 kg) IBW/kg (Calculated) : 68.4 Heparin Dosing Weight: 94kg  Vital Signs: Temp: 97.4 F (36.3 C) (02/22 1152) Temp Source: Oral (02/22 0932) BP: 145/99 (02/22 1152) Pulse Rate: 101 (02/22 1152)  Labs: Recent Labs    02/14/20 1412 02/14/20 1458 02/14/20 1900 02/15/20 0059 02/15/20 1038  HGB 15.7  15.7  --   --  14.6  --   HCT 49.6  49.4  --   --  46.2  --   PLT 174  167  --   --  172  --   APTT  --   --  32  --   --   LABPROT  --  15.4*  --   --   --   INR  --  1.2  --   --   --   HEPARINUNFRC  --   --   --  0.94* 0.88*  CREATININE 0.86  --   --  0.76  --   TROPONINIHS  --   --   --  27*  --     Estimated Creatinine Clearance: 105.3 mL/min (by C-G formula based on SCr of 0.76 mg/dL).   Assessment: 38 YOM presenting with abdominal pain/SOB, hx COVID infection in January, now with bilateral PE on CT.  On warfarin PTA for hx VTE, INR 1.2 on admission.    Last INR outpatient check in dec INR was 6.7 - regimen changed to 4 mg mon and 2 mg all other days  Hep lvl this morning high at 0.88  Goal of Therapy:  Heparin level 0.3-0.7 units/ml Monitor platelets by anticoagulation protocol: Yes   Plan:  Decrease heparin to 1100 units/hr Recheck hep lvl at 2000 Warfarin 4 mg x 1  Daily inr cbc hep lvl  Barth Kirks, PharmD, BCPS, BCCCP Clinical Pharmacist [phone]  Please check AMION for all [place] numbers  02/15/2020 12:56 PM

## 2020-02-16 ENCOUNTER — Inpatient Hospital Stay (HOSPITAL_COMMUNITY): Payer: Medicare Other

## 2020-02-16 ENCOUNTER — Other Ambulatory Visit (HOSPITAL_COMMUNITY): Payer: Medicare Other

## 2020-02-16 DIAGNOSIS — Z452 Encounter for adjustment and management of vascular access device: Secondary | ICD-10-CM

## 2020-02-16 DIAGNOSIS — I2609 Other pulmonary embolism with acute cor pulmonale: Principal | ICD-10-CM

## 2020-02-16 DIAGNOSIS — M7989 Other specified soft tissue disorders: Secondary | ICD-10-CM

## 2020-02-16 DIAGNOSIS — R1012 Left upper quadrant pain: Secondary | ICD-10-CM

## 2020-02-16 DIAGNOSIS — J9600 Acute respiratory failure, unspecified whether with hypoxia or hypercapnia: Secondary | ICD-10-CM

## 2020-02-16 DIAGNOSIS — J9601 Acute respiratory failure with hypoxia: Secondary | ICD-10-CM

## 2020-02-16 LAB — CBC
HCT: 46 % (ref 39.0–52.0)
HCT: 39 % (ref 39.0–52.0)
HCT: 38.6 % — ABNORMAL LOW (ref 39.0–52.0)
Hemoglobin: 14.5 g/dL (ref 13.0–17.0)
Hemoglobin: 12.2 g/dL — ABNORMAL LOW (ref 13.0–17.0)
Hemoglobin: 12.3 g/dL — ABNORMAL LOW (ref 13.0–17.0)
MCH: 32.4 pg (ref 26.0–34.0)
MCH: 32.7 pg (ref 26.0–34.0)
MCH: 32.5 pg (ref 26.0–34.0)
MCHC: 31.5 g/dL (ref 30.0–36.0)
MCHC: 31.3 g/dL (ref 30.0–36.0)
MCHC: 31.9 g/dL (ref 30.0–36.0)
MCV: 102.7 fL — ABNORMAL HIGH (ref 80.0–100.0)
MCV: 104.6 fL — ABNORMAL HIGH (ref 80.0–100.0)
MCV: 101.8 fL — ABNORMAL HIGH (ref 80.0–100.0)
Platelets: 198 10*3/uL (ref 150–400)
Platelets: 142 10*3/uL — ABNORMAL LOW (ref 150–400)
Platelets: 155 10*3/uL (ref 150–400)
RBC: 4.48 MIL/uL (ref 4.22–5.81)
RBC: 3.73 MIL/uL — ABNORMAL LOW (ref 4.22–5.81)
RBC: 3.79 MIL/uL — ABNORMAL LOW (ref 4.22–5.81)
RDW: 14.6 % (ref 11.5–15.5)
RDW: 14.6 % (ref 11.5–15.5)
RDW: 14.8 % (ref 11.5–15.5)
WBC: 12.1 10*3/uL — ABNORMAL HIGH (ref 4.0–10.5)
WBC: 20.8 10*3/uL — ABNORMAL HIGH (ref 4.0–10.5)
WBC: 24.9 10*3/uL — ABNORMAL HIGH (ref 4.0–10.5)
nRBC: 0 % (ref 0.0–0.2)
nRBC: 0.3 % — ABNORMAL HIGH (ref 0.0–0.2)
nRBC: 0.3 % — ABNORMAL HIGH (ref 0.0–0.2)

## 2020-02-16 LAB — POCT I-STAT 7, (LYTES, BLD GAS, ICA,H+H)
Acid-base deficit: 1 mmol/L (ref 0.0–2.0)
Bicarbonate: 27.2 mmol/L (ref 20.0–28.0)
Calcium, Ion: 1.15 mmol/L (ref 1.15–1.40)
HCT: 36 % — ABNORMAL LOW (ref 39.0–52.0)
Hemoglobin: 12.2 g/dL — ABNORMAL LOW (ref 13.0–17.0)
O2 Saturation: 100 %
Patient temperature: 98.7
Potassium: 3.4 mmol/L — ABNORMAL LOW (ref 3.5–5.1)
Sodium: 139 mmol/L (ref 135–145)
TCO2: 29 mmol/L (ref 22–32)
pCO2 arterial: 60.9 mmHg — ABNORMAL HIGH (ref 32.0–48.0)
pH, Arterial: 7.259 — ABNORMAL LOW (ref 7.350–7.450)
pO2, Arterial: 465 mmHg — ABNORMAL HIGH (ref 83.0–108.0)

## 2020-02-16 LAB — BASIC METABOLIC PANEL
Anion gap: 11 (ref 5–15)
Anion gap: 12 (ref 5–15)
Anion gap: 14 (ref 5–15)
BUN: 21 mg/dL (ref 8–23)
BUN: 29 mg/dL — ABNORMAL HIGH (ref 8–23)
BUN: 35 mg/dL — ABNORMAL HIGH (ref 8–23)
CO2: 32 mmol/L (ref 22–32)
CO2: 27 mmol/L (ref 22–32)
CO2: 26 mmol/L (ref 22–32)
Calcium: 8.9 mg/dL (ref 8.9–10.3)
Calcium: 7.9 mg/dL — ABNORMAL LOW (ref 8.9–10.3)
Calcium: 8.2 mg/dL — ABNORMAL LOW (ref 8.9–10.3)
Chloride: 99 mmol/L (ref 98–111)
Chloride: 102 mmol/L (ref 98–111)
Chloride: 103 mmol/L (ref 98–111)
Creatinine, Ser: 0.96 mg/dL (ref 0.61–1.24)
Creatinine, Ser: 1.73 mg/dL — ABNORMAL HIGH (ref 0.61–1.24)
Creatinine, Ser: 1.9 mg/dL — ABNORMAL HIGH (ref 0.61–1.24)
GFR calc Af Amer: >60 mL/min (ref >60)
GFR calc Af Amer: 47 mL/min — ABNORMAL LOW (ref >60)
GFR calc Af Amer: 42 mL/min — ABNORMAL LOW (ref >60)
GFR calc non Af Amer: >60 mL/min (ref >60)
GFR calc non Af Amer: 40 mL/min — ABNORMAL LOW (ref >60)
GFR calc non Af Amer: 36 mL/min — ABNORMAL LOW (ref >60)
Glucose, Bld: 134 mg/dL — ABNORMAL HIGH (ref 70–99)
Glucose, Bld: 155 mg/dL — ABNORMAL HIGH (ref 70–99)
Glucose, Bld: 155 mg/dL — ABNORMAL HIGH (ref 70–99)
Potassium: 3.8 mmol/L (ref 3.5–5.1)
Potassium: 3.5 mmol/L (ref 3.5–5.1)
Potassium: 3.9 mmol/L (ref 3.5–5.1)
Sodium: 142 mmol/L (ref 135–145)
Sodium: 141 mmol/L (ref 135–145)
Sodium: 143 mmol/L (ref 135–145)

## 2020-02-16 LAB — ECHOCARDIOGRAM LIMITED
Height: 68 in
Weight: 3612.02 oz

## 2020-02-16 LAB — GLUCOSE, CAPILLARY
Glucose-Capillary: 191 mg/dL — ABNORMAL HIGH (ref 70–99)
Glucose-Capillary: 143 mg/dL — ABNORMAL HIGH (ref 70–99)
Glucose-Capillary: 120 mg/dL — ABNORMAL HIGH (ref 70–99)

## 2020-02-16 LAB — PROTIME-INR
INR: 1.3 — ABNORMAL HIGH (ref 0.8–1.2)
Prothrombin Time: 15.6 seconds — ABNORMAL HIGH (ref 11.4–15.2)

## 2020-02-16 LAB — HEPARIN LEVEL (UNFRACTIONATED)
Heparin Unfractionated: 0.24 IU/mL — ABNORMAL LOW (ref 0.30–0.70)
Heparin Unfractionated: 0.19 IU/mL — ABNORMAL LOW (ref 0.30–0.70)

## 2020-02-16 MED ORDER — FENTANYL CITRATE (PF) 100 MCG/2ML IJ SOLN
INTRAMUSCULAR | Status: AC
  Administered 2020-02-16: 100 ug via INTRAVENOUS
  Filled 2020-02-16: qty 2

## 2020-02-16 MED ORDER — VASOPRESSIN 20 UNIT/ML IV SOLN
0.0400 [IU]/min | INTRAVENOUS | Status: DC
  Filled 2020-02-16: qty 2

## 2020-02-16 MED ORDER — PROPOFOL 1000 MG/100ML IV EMUL
0.0000 ug/kg/min | INTRAVENOUS | Status: DC
  Administered 2020-02-16 – 2020-02-19 (×8): 15 ug/kg/min via INTRAVENOUS
  Administered 2020-02-19 – 2020-02-20 (×2): 20 ug/kg/min via INTRAVENOUS
  Filled 2020-02-16 (×10): qty 100

## 2020-02-16 MED ORDER — MIDAZOLAM HCL 2 MG/2ML IJ SOLN: 2.0000 mg | Freq: Once | INTRAMUSCULAR | Status: AC

## 2020-02-16 MED ORDER — LACTATED RINGERS IV BOLUS
1000.0000 mL | Freq: Once | INTRAVENOUS | Status: AC
  Administered 2020-02-16: 1000 mL via INTRAVENOUS

## 2020-02-16 MED ORDER — SODIUM CHLORIDE 0.9 % IV SOLN
250.0000 mL | INTRAVENOUS | Status: DC
  Administered 2020-02-17 – 2020-02-19 (×2): 250 mL via INTRAVENOUS

## 2020-02-16 MED ORDER — NOREPINEPHRINE 16 MG/250ML-% IV SOLN
0.0000 ug/min | INTRAVENOUS | Status: DC
  Administered 2020-02-16: 20 ug/min via INTRAVENOUS
  Filled 2020-02-16: qty 250

## 2020-02-16 MED ORDER — HEPARIN (PORCINE) 25000 UT/250ML-% IV SOLN
1050.0000 [IU]/h | INTRAVENOUS | Status: DC
  Administered 2020-02-16 – 2020-02-17 (×3): 900 [IU]/h via INTRAVENOUS
  Administered 2020-02-19: 800 [IU]/h via INTRAVENOUS
  Administered 2020-02-20 – 2020-02-21 (×2): 900 [IU]/h via INTRAVENOUS
  Administered 2020-02-22: 950 [IU]/h via INTRAVENOUS
  Administered 2020-02-23 – 2020-02-25 (×4): 1000 [IU]/h via INTRAVENOUS
  Administered 2020-02-27 – 2020-02-28 (×2): 900 [IU]/h via INTRAVENOUS
  Administered 2020-02-29: 1050 [IU]/h via INTRAVENOUS
  Filled 2020-02-16 (×12): qty 250

## 2020-02-16 MED ORDER — ETOMIDATE 2 MG/ML IV SOLN
0.3000 mg/kg | Freq: Once | INTRAVENOUS | Status: AC
  Administered 2020-02-16: 20 mg via INTRAVENOUS

## 2020-02-16 MED ORDER — MIDAZOLAM HCL 2 MG/2ML IJ SOLN
2.0000 mg | INTRAMUSCULAR | Status: DC | PRN
  Administered 2020-02-16 – 2020-02-17 (×4): 2 mg via INTRAVENOUS
  Filled 2020-02-16 (×5): qty 2

## 2020-02-16 MED ORDER — OXYMETAZOLINE HCL 0.05 % NA SOLN
1.0000 | Freq: Two times a day (BID) | NASAL | Status: DC
  Administered 2020-02-16 – 2020-03-01 (×18): 1 via NASAL
  Filled 2020-02-16 (×2): qty 30

## 2020-02-16 MED ORDER — FENTANYL CITRATE (PF) 100 MCG/2ML IJ SOLN
INTRAMUSCULAR | Status: AC
  Administered 2020-02-16: 100 ug
  Filled 2020-02-16: qty 2

## 2020-02-16 MED ORDER — ROCURONIUM BROMIDE 50 MG/5ML IV SOLN
1.0000 mg/kg | Freq: Once | INTRAVENOUS | Status: AC
  Administered 2020-02-16: 50 mg via INTRAVENOUS
  Filled 2020-02-16: qty 10.24

## 2020-02-16 MED ORDER — SODIUM CHLORIDE 0.9 % IV SOLN
3.0000 g | Freq: Four times a day (QID) | INTRAVENOUS | Status: DC
  Administered 2020-02-16 – 2020-02-17 (×4): 3 g via INTRAVENOUS
  Filled 2020-02-16 (×4): qty 8

## 2020-02-16 MED ORDER — CHLORHEXIDINE GLUCONATE CLOTH 2 % EX PADS
6.0000 | MEDICATED_PAD | Freq: Every day | CUTANEOUS | Status: DC
  Administered 2020-02-16 – 2020-03-01 (×14): 6 via TOPICAL

## 2020-02-16 MED ORDER — PANTOPRAZOLE SODIUM 40 MG IV SOLR
40.0000 mg | INTRAVENOUS | Status: DC
  Administered 2020-02-16 – 2020-02-20 (×5): 40 mg via INTRAVENOUS
  Filled 2020-02-16 (×5): qty 40

## 2020-02-16 MED ORDER — FENTANYL 2500MCG IN NS 250ML (10MCG/ML) PREMIX INFUSION
25.0000 ug/h | INTRAVENOUS | Status: DC
  Administered 2020-02-16: 14:00:00 75 ug/h via INTRAVENOUS
  Administered 2020-02-17 – 2020-02-18 (×3): 150 ug/h via INTRAVENOUS
  Administered 2020-02-19: 50 ug/h via INTRAVENOUS
  Filled 2020-02-16 (×5): qty 250

## 2020-02-16 MED ORDER — PERFLUTREN LIPID MICROSPHERE
1.0000 mL | INTRAVENOUS | Status: AC | PRN
  Administered 2020-02-16: 2 mL via INTRAVENOUS
  Filled 2020-02-16: qty 10

## 2020-02-16 MED ORDER — VITAMIN D 25 MCG (1000 UNIT) PO TABS
1000.0000 [IU] | ORAL_TABLET | Freq: Every day | ORAL | Status: DC
  Administered 2020-02-17 – 2020-02-19 (×3): 1000 [IU]
  Filled 2020-02-16 (×3): qty 1

## 2020-02-16 MED ORDER — HYDROCORTISONE NA SUCCINATE PF 100 MG IJ SOLR
50.0000 mg | Freq: Four times a day (QID) | INTRAMUSCULAR | Status: DC
  Administered 2020-02-16 – 2020-02-19 (×12): 50 mg via INTRAVENOUS
  Filled 2020-02-16 (×12): qty 2

## 2020-02-16 MED ORDER — PREDNISONE 10 MG PO TABS: 10.0000 mg | ORAL_TABLET | Freq: Every day | ORAL | Status: DC

## 2020-02-16 MED ORDER — FENTANYL BOLUS VIA INFUSION
25.0000 ug | INTRAVENOUS | Status: DC | PRN
  Administered 2020-02-19: 21:00:00 25 ug via INTRAVENOUS
  Filled 2020-02-16: qty 25

## 2020-02-16 MED ORDER — FENTANYL CITRATE (PF) 100 MCG/2ML IJ SOLN: 25.0000 ug | Freq: Once | INTRAMUSCULAR | Status: DC

## 2020-02-16 MED ORDER — MIDAZOLAM HCL 2 MG/2ML IJ SOLN
INTRAMUSCULAR | Status: AC
  Administered 2020-02-16: 2 mg via INTRAVENOUS
  Filled 2020-02-16: qty 2

## 2020-02-16 MED ORDER — PHENYLEPHRINE HCL-NACL 10-0.9 MG/250ML-% IV SOLN
INTRAVENOUS | Status: AC
  Filled 2020-02-16: qty 250

## 2020-02-16 MED ORDER — PHENYLEPHRINE HCL-NACL 10-0.9 MG/250ML-% IV SOLN
25.0000 ug/min | INTRAVENOUS | Status: DC
  Administered 2020-02-16: 13:00:00 25 ug/min via INTRAVENOUS

## 2020-02-16 MED ORDER — TRANEXAMIC ACID FOR EPISTAXIS
500.0000 mg | Freq: Once | TOPICAL | Status: DC
  Filled 2020-02-16: qty 10

## 2020-02-16 MED ORDER — SULFAMETHOXAZOLE-TRIMETHOPRIM 400-80 MG PO TABS
1.0000 | ORAL_TABLET | ORAL | Status: DC
  Filled 2020-02-16: qty 1

## 2020-02-16 MED ORDER — MIDAZOLAM HCL 2 MG/2ML IJ SOLN
INTRAMUSCULAR | Status: AC
  Administered 2020-02-16: 13:00:00 1 mg via INTRAVENOUS
  Filled 2020-02-16: qty 2

## 2020-02-16 MED ORDER — FENTANYL CITRATE (PF) 100 MCG/2ML IJ SOLN: 100.0000 ug | Freq: Once | INTRAMUSCULAR | Status: AC

## 2020-02-16 MED FILL — Medication: Qty: 1 | Status: AC

## 2020-02-16 NOTE — Progress Notes (Signed)
OT Cancellation Note  Patient Details Name: Halston Kintz MRN: [id number] DOB: 08-13-1951   Cancelled Treatment:    Reason Eval/Treat Not Completed: Medical issues which prohibited therapy(Pt with continuous nose bleed since early this AM and on blood thinners. RN request hold. Will return as schedule allows.)  Shirley, OTR/L Acute Rehab Pager: [phone] Office: [phone] 02/16/2020, 10:24 AM

## 2020-02-16 NOTE — Procedures (Signed)
Emergent Intubation Note Indication: epistaxis, respiratory distress Meds: Versed, fentanyl, etomidate, rocuronium MAC4 glidescope and 8-0 tube inserted, good CO2 color change and breath sounds No immediate complications  Erskine Emery MD

## 2020-02-16 NOTE — Procedures (Signed)
Preop diagnosis: Epistaxis Postop diagnosis: same Procedure: Cautery and packing of left nasal passage Surgeon: Redmond Baseman Anesth: None Findings:  There is an area of granulation on the left mid-septum. Description:  The nasal passages were inspected using a nasal speculum.  Silver nitrate was applied to the left septum granulation tissue.  A 10 cm Merocel pack was trimmed to about 2/3 length and was coated with Bacitracin and placed in the left nasal passage.  The pack was saturated with Afrin.  He was then returned to nursing care for transfer.

## 2020-02-16 NOTE — Progress Notes (Signed)
Patient called out for assistance to use restroom. This RN went into the room with NT [facility] and assisted patient to bedside commode. Patient successfully made it to [facility] and then proceeded to inform nurse that he was having trouble breathing. RN brought face tent mask up to patient's face to help with breathing but due to persistent nose bleed blood kept dripping into mask. Patient then states "I can't breathe, I can't breathe." This RN then attempted to clean patient's face and mask however patient became agitated and started pushing RN away. This RN then instructed NT Ruby Cola to go get help and call rapid response. Patient then started passing large clots from nose. This was a new finding as patient had not been passing clots. Patient then started jerking in the chair and stop responding to commands. Code Blue was then initiated.

## 2020-02-16 NOTE — Procedures (Signed)
Central Venous Catheter Insertion Procedure Note Jesus Russell [id number] [birth year]  Procedure: Insertion of Central Venous Catheter Indications: Assessment of intravascular volume, Drug and/or fluid administration and Frequent blood sampling  Procedure Details Consent: Risks of procedure as well as the alternatives and risks of each were explained to the (patient/caregiver).  Consent for procedure obtained. Time Out: Verified patient identification, verified procedure, site/side was marked, verified correct patient position, special equipment/implants available, medications/allergies/relevent history reviewed, required imaging and test results available.  Performed  Maximum sterile technique was used including antiseptics, cap, gloves, gown, hand hygiene, mask and sheet. Skin prep: Chlorhexidine; local anesthetic administered A antimicrobial bonded/coated triple lumen catheter was placed in the left subclavian vein using the Seldinger technique.  Evaluation Blood flow good Complications: No apparent complications Patient did tolerate procedure well. Chest X-ray ordered to verify placement.  CXR: pending.  [name] 02/16/2020, 2:31 PM  [name] ACNP-BC [place] Pager # [phone] OR # [phone] if no answer

## 2020-02-16 NOTE — Progress Notes (Signed)
RT note-Assisted with Bronchoscopy with Marni Griffon NP post intubation.

## 2020-02-16 NOTE — Progress Notes (Addendum)
F/u New DVT New RV strain on ECHO  Now that bleeding is controlled will try systemic heparin again.   [name] ACNP-BC [place] Pager # [phone] OR # [phone] if no answer  Discussed with [facility] Low threshold for systemic tPA tonight if worsening shock.  Erskine Emery MD

## 2020-02-16 NOTE — Progress Notes (Signed)
  Echocardiogram 2D Echocardiogram limited with definity has been performed.  Jesus Russell M 02/16/2020, 3:27 PM

## 2020-02-16 NOTE — Progress Notes (Addendum)
ANTICOAGULATION CONSULT NOTE  Pharmacy Consult for heparin Indication: pulmonary embolus  No Known Allergies  Patient Measurements: Height: _0  (172.7 cm) Weight: 225 lb 12 oz (102.4 kg) IBW/kg (Calculated) : 68.4 Heparin Dosing Weight: 94 kg  Vital Signs: Temp: 97.6 F (36.4 C) (02/23 0739) Temp Source: Oral (02/23 0739) BP: 119/98 (02/23 0739) Pulse Rate: 91 (02/23 0739)  Labs: Recent Labs    02/14/20 1412 02/14/20 1412 02/14/20 1458 02/14/20 1900 02/15/20 0059 02/15/20 1038 02/15/20 2038 02/16/20 0240 02/16/20 1135  HGB 15.7  15.7   < >  --   --  14.6  --  14.5 14.5  --   HCT 49.6  49.4   < >  --   --  46.2  --  46.1 46.0  --   PLT 174  167   < >  --   --  172  --  182 198  --   APTT  --   --   --  32  --   --   --   --   --   LABPROT  --   --  15.4*  --   --   --   --  15.6*  --   INR  --   --  1.2  --   --   --   --  1.3*  --   HEPARINUNFRC  --   --   --   --  0.94*   < > 0.75* 0.24* 0.19*  CREATININE 0.86  --   --   --  0.76  --   --  0.96  --   TROPONINIHS  --   --   --   --  27*  --   --   --   --    < > = values in this interval not displayed.    Estimated Creatinine Clearance: 87.8 mL/min (by C-G formula based on SCr of 0.96 mg/dL).   Assessment: 69 yr old male presented with abdominal pain/SOB, hx COVID infection in January, now with bilateral PE on CT.  On warfarin PTA for hx VTE, INR 1.2 on admission.    Last INR outpatient check in INR was 6.7 - warfarin regimen was  changed to 4 mg on Mondays and 2 mg on all other days.  Last night and this morning the patient has been experiencing epistaxis but the heparin was continued until the patient experienced life threatening bleeding this afternoon and a code blue was called. The nose was packed and cauterized by ENT at this time the patient was emergently intubated and brought to the ICU. The last heparin level was low at 0.19 on 900 units an hour and was elevated at 0.75 on 1100 units/hr so would  restart at 1000units/hr when able. We will hold heparin at least until the morning given the severity of the bleed and check dopplers to assess the risk of DVT.  Goal of Therapy:  Heparin level 0.3-0.7 units/ml Monitor platelets by anticoagulation protocol: Yes   Plan:  Hold Heparin at least until the morning  Monitor daily heparin level, CBC - on hold  Monitor for signs/symptoms of bleeding  Nicoletta Dress, PharmD PGY2 Infectious Disease Pharmacy Resident  02/16/2020 12:39 PM

## 2020-02-16 NOTE — Progress Notes (Signed)
PT Cancellation Note  Patient Details Name: Jesus Russell MRN: [id number] DOB: February 02, 1951   Cancelled Treatment:    Reason Eval/Treat Not Completed: Medical issues which prohibited therapy  Pt with continuous nose bleed since early this AM and on blood thinners. RN request hold. Will return as schedule allows and medically appropriate.   Arby Barrette, PT Pager [phone]    Rexanne Mano 02/16/2020, 12:18 PM

## 2020-02-16 NOTE — Progress Notes (Signed)
Pharmacy Antibiotic Note  Bracken Moffa is a 69 y.o. male admitted to the ICU on 2/23 with concern for aspiration pneumonia and epistaxis s/p packing and cauterization by ENT.  Pharmacy has been consulted for Unasyn dosing.  Plan: Unasyn 3g IV q6h  Height: 5' 8" (172.7 cm) Weight: 225 lb 12 oz (102.4 kg) IBW/kg (Calculated) : 68.4  Temp (24hrs), Avg:97.8 F (36.6 C), Min:97.6 F (36.4 C), Max:98.2 F (36.8 C)  Recent Labs  Lab 02/14/20 1412 02/15/20 0059 02/15/20 2038 02/16/20 0240  WBC 12.7*  13.0* 11.3* 12.9* 12.1*  CREATININE 0.86 0.76  --  0.96    Estimated Creatinine Clearance: 87.8 mL/min (by C-G formula based on SCr of 0.96 mg/dL).    No Known Allergies  Antimicrobials this admission: Unasyn 2/23>>   Thank you for allowing pharmacy to be a part of this patient's care.  Phillis Haggis 02/16/2020 2:09 PM

## 2020-02-16 NOTE — Progress Notes (Signed)
Called Pharmacy at the request of IMTS team to discuss Heparin gtt. Pt is having persistent nose bleeds and rounding team not successful in packing. Spoke with pharmacist Ronalee Belts and IMTS to give him at call back at [phone] to discuss Heparin gtt. Will continue to monitor and assess patient.

## 2020-02-16 NOTE — Procedures (Signed)
Bronchoscopy Procedure Note Jesus Russell [id number] Aug 10, 1951  Procedure: Bronchoscopy Indications: Remove secretions  Procedure Details Consent: Unable to obtain consent because of emergent medical necessity. Time Out: Verified patient identification, verified procedure, site/side was marked, verified correct patient position, special equipment/implants available, medications/allergies/relevent history reviewed, required imaging and test results available.  Performed  In preparation for procedure, patient was given 100% FiO2. Sedation: Benzodiazepines  Airway entered and the following bronchi were examined: RUL, RML, RLL, LUL, LLL and Bronchi.  s Procedures performed: evaluation of the RU, RM RL LU and LL lobe. The airways were w/out bloody secretions or obstruction. The airways were hyperemic but no active bleeding or blood clotts.  Bronchoscope removed.    Evaluation Hemodynamic Status: BP stable throughout; O2 sats: stable throughout Patient's Current Condition: stable Specimens:  None Complications: No apparent complications Patient did tolerate procedure well.   Jesus Russell 02/16/2020  [name] ACNP-BC [place] Pager # [phone] OR # [phone] if no answer

## 2020-02-16 NOTE — Progress Notes (Signed)
Patient seen at the bedside after being paged of profuse epistaxis again. Patient was agreeable to having Rhino Rocket placed.  Heparin was turned off for roughly 20 minutes. We ultimately decided not to use TXA, as there is evidence that it may increase risk of systemic thromboembolism. Given his underlying positive lupus anticoagulant and propensity for developing systemic clots, it was decided to avoid this medication. Rhino Rocket was soaked in a mixture of saline and Afrin spray for 30 seconds. We attempted to place Rhino Rocket in the left nare, however the patient screamed in pain as the Rhino Rocket was about halfway in place and met some resistance. Patient refused to have it advanced any further, and the Rhino Rocket subsequently fell out of the patient's nose.  At this point, the patient's epistaxis had significantly slowed.  We decided to turn on the heparin again, and discuss with the day team, as they will arrive shortly.  Patient will likely benefit from an ENT consult.  Earlene Plater, MD Internal Medicine, PGY1 Pager: [phone]  02/16/2020,4:47 AM

## 2020-02-16 NOTE — Progress Notes (Signed)
ANTICOAGULATION CONSULT NOTE  Pharmacy Consult for heparin Indication: pulmonary embolus  No Known Allergies  Patient Measurements: Height: _0  (172.7 cm) Weight: 225 lb 12 oz (102.4 kg) IBW/kg (Calculated) : 68.4 Heparin Dosing Weight: 94 kg  Vital Signs: Temp: 97.6 F (36.4 C) (02/23 1515) Temp Source: Axillary (02/23 1515) BP: 98/81 (02/23 1545) Pulse Rate: 99 (02/23 1445)  Labs: Recent Labs    02/14/20 1412 02/14/20 1458 02/14/20 1900 02/15/20 0059 02/15/20 1038 02/15/20 2038 02/15/20 2038 02/16/20 0240 02/16/20 1135 02/16/20 1400 02/16/20 1516  HGB   < >  --   --  14.6  --  14.5   < > 14.5  --  12.2*  --   HCT   < >  --   --  46.2  --  46.1  --  46.0  --  36.0*  --   PLT   < >  --   --  172  --  182  --  198  --   --   --   APTT  --   --  32  --   --   --   --   --   --   --   --   LABPROT  --  15.4*  --   --   --   --   --  15.6*  --   --   --   INR  --  1.2  --   --   --   --   --  1.3*  --   --   --   HEPARINUNFRC  --   --   --  0.94*   < > 0.75*  --  0.24* 0.19*  --   --   CREATININE   < >  --   --  0.76  --   --   --  0.96  --   --  1.73*  TROPONINIHS  --   --   --  27*  --   --   --   --   --   --   --    < > = values in this interval not displayed.    Estimated Creatinine Clearance: 48.7 mL/min (A) (by C-G formula based on SCr of 1.73 mg/dL (H)).   Assessment: 69 yr old male presented with abdominal pain/SOB, hx COVID infection in January, now with bilateral PE on CT.  On warfarin PTA for hx VTE, INR 1.2 on admission.    Last INR outpatient check in INR was 6.7 - warfarin regimen was  changed to 4 mg on Mondays and 2 mg on all other days.  Last night and this morning the patient has been experiencing epistaxis but the heparin was continued until the patient experienced life threatening bleeding this afternoon and a code blue was called. The nose was packed and cauterized by ENT at this time the patient was emergently intubated and brought to the  ICU.   Per noted new DVT and RV strain on echo. Pharmacy to restart heparin  -Hg= 12.2    Goal of Therapy:  Heparin level= 0.3-0.5 Monitor platelets by anticoagulation protocol: Yes   Plan:  -restart heparin at 900 units/hr -Heparin level in 6 hours and daily wth CBC daily  Hildred Laser, PharmD Clinical Pharmacist **Pharmacist phone directory can now be found on [email or web address] ([email or web address]).  Listed under [place].

## 2020-02-16 NOTE — Significant Event (Signed)
Rapid Response Event Note  Overview: Time Called: 1155 Arrival Time: 1201 Event Type: Respiratory Code Blue Activated  Initial Focused Assessment: Change in pt status preceeded by him getting up to use the bathroom. Pt now restless, stating he can't breath and that his chest hurts. Pt attempting to get out of bed. Pt has had large clot pass from his nose, per primary RN. Pt tachycardic, HR 130-160bpm on Zoll.   Decision made to intubated. Pt given 160mg IV Fentanyl, 259mIV Versed, 2029mV Etomidate, 39m42m Rocuronium for intubation.   Pt transferred to ICU with RR RN and respiratory therapist. ICU RN at bedside to receive pt on arrival to unit. Pt transferred on continuous monitoring, no complications during transport.  Outcome: Transferred (Comment)(Transferred to ICU)  Event End Time: 1250[place]

## 2020-02-16 NOTE — Progress Notes (Signed)
Bilateral lower extremity venous duplex completed. Refer to "CV Proc" under chart review to view preliminary results.  Critical results discussed with Marni Griffon, NP.  02/16/2020 3:56 PM Kelby Aline., MHA, RVT, RDCS, RDMS

## 2020-02-16 NOTE — Consult Note (Signed)
Reason for Consult: Epistaxis Referring Physician: Int. Med.  Jesus Russell is an 69 y.o. male.  HPI: 69 year old male admitted for pulmonary embolism with clotting disorder who has been treated with heparin and warfarin.  He started bleeding last night from the nose last night and this has continued today in spite of spraying Afrin.  An attempt to pack the nose was too uncomfortable.  He has just become obtunded and a code blue was called.  He has been intubated and is being transferred to ICU.  Past Medical History:  Diagnosis Date  . DVT of lower extremity, bilateral ([place])   . Hypertension   . Incarcerated umbilical hernia [id number]/9480   Status post repair  . Lupus anticoagulant positive    Per records from [facility]  . 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 Pulmonary artery hypertension ([place]) 04/2011   Mildly elevated pulmonary artery pressure in setting of PE (from [facility])  . Pulmonary embolism ([place]) 04/2011  . Sarcoidosis 1989  . Subdural hematoma ([facility])    Remote episode, occurred when taking excedrin and warfarin.     Past Surgical History:  Procedure Laterality Date  . BRAIN SURGERY    . HERNIA REPAIR      Family History  Adopted: Yes    Social History:  reports that he quit smoking about 26 years ago. His smoking use included cigarettes. He has a 34.50 pack-year smoking history. He has never used smokeless tobacco. He reports previous drug use. Drug: "Crack" cocaine. He reports that he does not drink alcohol.  Allergies: No Known Allergies  Medications: I have reviewed the patient's current medications.  Results for orders placed or performed during the hospital encounter of 02/14/20 (from the past 48 hour(s))  Lipase, blood     Status: None   Collection Time: 02/14/20  2:12 PM  Result Value Ref Range   Lipase 19 11 - 51 U/L    Comment: Performed at [facility], [address],  [place], [place] [address]  Comprehensive metabolic panel     Status: Abnormal   Collection Time: 02/14/20  2:12 PM  Result Value Ref Range   Sodium 143 135 - 145 mmol/L   Potassium 4.3 3.5 - 5.1 mmol/L   Chloride 102 98 - 111 mmol/L   CO2 30 22 - 32 mmol/L   Glucose, Bld 97 70 - 99 mg/dL   BUN 19 8 - 23 mg/dL   Creatinine, Ser 0.86 0.61 - 1.24 mg/dL   Calcium 8.6 (L) 8.9 - 10.3 mg/dL   Total Protein 6.6 6.5 - 8.1 g/dL   Albumin 3.6 3.5 - 5.0 g/dL   AST 27 15 - 41 U/L   ALT 18 0 - 44 U/L   Alkaline Phosphatase 89 38 - 126 U/L   Total Bilirubin 1.4 (H) 0.3 - 1.2 mg/dL   GFR calc non Af Amer >60 >60 mL/min   GFR calc Af Amer >60 >60 mL/min   Anion gap 11 5 - 15    Comment: Performed at [facility] [address], [place] [address]  CBC     Status: Abnormal   Collection Time: 02/14/20  2:12 PM  Result Value Ref Range   WBC 13.0 (H) 4.0 - 10.5 K/uL   RBC 4.84 4.22 - 5.81 MIL/uL   Hemoglobin 15.7 13.0 - 17.0 g/dL   HCT 49.4 39.0 -  52.0 %   MCV 102.1 (H) 80.0 - 100.0 fL   MCH 32.4 26.0 - 34.0 pg   MCHC 31.8 30.0 - 36.0 g/dL   RDW 15.0 11.5 - 15.5 %   Platelets 167 150 - 400 K/uL   nRBC 0.0 0.0 - 0.2 %    Comment: Performed at [facility], [place] [address] [place], [place] [address]  CBC with Differential/Platelet     Status: Abnormal   Collection Time: 02/14/20  2:12 PM  Result Value Ref Range   WBC 12.7 (H) 4.0 - 10.5 K/uL   RBC 4.87 4.22 - 5.81 MIL/uL   Hemoglobin 15.7 13.0 - 17.0 g/dL   HCT 49.6 39.0 - 52.0 %   MCV 101.8 (H) 80.0 - 100.0 fL   MCH 32.2 26.0 - 34.0 pg   MCHC 31.7 30.0 - 36.0 g/dL   RDW 15.0 11.5 - 15.5 %   Platelets 174 150 - 400 K/uL   nRBC 0.0 0.0 - 0.2 %   Neutrophils Relative % 72 %   Neutro Abs 9.2 (H) 1.7 - 7.7 K/uL   Lymphocytes Relative 16 %   Lymphs Abs 2.0 0.7 - 4.0 K/uL   Monocytes Relative 7 %   Monocytes Absolute 0.9 0.1 - 1.0 K/uL   Eosinophils Relative 3 %   Eosinophils Absolute 0.3 0.0 - 0.5 K/uL   Basophils  Relative 1 %   Basophils Absolute 0.1 0.0 - 0.1 K/uL   Immature Granulocytes 1 %   Abs Immature Granulocytes 0.10 (H) 0.00 - 0.07 K/uL    Comment: Performed at [facility] [address], [place], [place] [address]  Protime-INR     Status: Abnormal   Collection Time: 02/14/20  2:58 PM  Result Value Ref Range   Prothrombin Time 15.4 (H) 11.4 - 15.2 seconds   INR 1.2 0.8 - 1.2    Comment: (NOTE) INR goal varies based on device and disease states. Performed at [facility], [place] [address], [place], [place] [address]   APTT     Status: None   Collection Time: 02/14/20  7:00 PM  Result Value Ref Range   aPTT 32 24 - 36 seconds    Comment: Performed at [facility] [address], [place], [place] [address]  SARS CORONAVIRUS 2 (TAT 6-24 HRS) Nasopharyngeal Nasopharyngeal Swab     Status: Abnormal   Collection Time: 02/14/20  7:22 PM   Specimen: Nasopharyngeal Swab  Result Value Ref Range   SARS Coronavirus 2 POSITIVE (A) NEGATIVE    Comment: RESULT CALLED TO, READ BACK BY AND VERIFIED WITH: Darra Lis, RN AT 2332 ON 02/14/2020 BY SAINVILUS S (NOTE) SARS-CoV-2 target nucleic acids are DETECTED. The SARS-CoV-2 RNA is generally detectable in upper and lower respiratory specimens during the acute phase of infection. Positive results are indicative of the presence of SARS-CoV-2 RNA. Clinical correlation with patient history and other diagnostic information is  necessary to determine patient infection status. Positive results do not rule out bacterial infection or co-infection with other viruses.  The expected result is Negative. Fact Sheet for Patients: SugarRoll.be Fact Sheet for Healthcare Providers: https://www.woods-mathews.com/ This test is not yet approved or cleared by the [place] FDA and  has been authorized for detection and/or diagnosis of SARS-CoV-2 by FDA under an Emergency Use Authorization (EUA). This EUA  will remain  in effect (meaning this test can b e used) for the duration of the COVID-19 declaration under Section  564(b)(1) of the Act, 21 U.S.C. section 360bbb-3(b)(1), unless the authorization is terminated or revoked sooner. Performed at [facility], [place] [address] [place], [place] [address]   Heparin level (unfractionated)     Status: Abnormal   Collection Time: 02/15/20 12:59 AM  Result Value Ref Range   Heparin Unfractionated 0.94 (H) 0.30 - 0.70 IU/mL    Comment: (NOTE) If heparin results are below expected values, and patient dosage has  been confirmed, suggest follow up testing of antithrombin III levels. Performed at [facility], [place] [address] [place], [place] [address]   Magnesium     Status: None   Collection Time: 02/15/20 12:59 AM  Result Value Ref Range   Magnesium 1.7 1.7 - 2.4 mg/dL    Comment: Performed at [facility] [address] [place], [place] [address]  CBC WITH DIFFERENTIAL     Status: Abnormal   Collection Time: 02/15/20 12:59 AM  Result Value Ref Range   WBC 11.3 (H) 4.0 - 10.5 K/uL   RBC 4.53 4.22 - 5.81 MIL/uL   Hemoglobin 14.6 13.0 - 17.0 g/dL   HCT 46.2 39.0 - 52.0 %   MCV 102.0 (H) 80.0 - 100.0 fL   MCH 32.2 26.0 - 34.0 pg   MCHC 31.6 30.0 - 36.0 g/dL   RDW 14.8 11.5 - 15.5 %   Platelets 172 150 - 400 K/uL   nRBC 0.0 0.0 - 0.2 %   Neutrophils Relative % 68 %   Neutro Abs 7.8 (H) 1.7 - 7.7 K/uL   Lymphocytes Relative 18 %   Lymphs Abs 2.0 0.7 - 4.0 K/uL   Monocytes Relative 8 %   Monocytes Absolute 0.9 0.1 - 1.0 K/uL   Eosinophils Relative 4 %   Eosinophils Absolute 0.4 0.0 - 0.5 K/uL   Basophils Relative 1 %   Basophils Absolute 0.1 0.0 - 0.1 K/uL   Immature Granulocytes 1 %   Abs Immature Granulocytes 0.08 (H) 0.00 - 0.07 K/uL    Comment: Performed at [facility], [address] [place], [place] [address]  Basic metabolic panel     Status: Abnormal   Collection Time: 02/15/20 12:59 AM  Result Value  Ref Range   Sodium 142 135 - 145 mmol/L   Potassium 3.3 (L) 3.5 - 5.1 mmol/L   Chloride 100 98 - 111 mmol/L   CO2 28 22 - 32 mmol/L   Glucose, Bld 118 (H) 70 - 99 mg/dL   BUN 14 8 - 23 mg/dL   Creatinine, Ser 0.76 0.61 - 1.24 mg/dL   Calcium 8.4 (L) 8.9 - 10.3 mg/dL   GFR calc non Af Amer >60 >60 mL/min   GFR calc Af Amer >60 >60 mL/min   Anion gap 14 5 - 15    Comment: Performed at [facility] [address], [place], [place] [address]  Fibrinogen     Status: None   Collection Time: 02/15/20 12:59 AM  Result Value Ref Range   Fibrinogen 245 210 - 475 mg/dL    Comment: Performed at [facility] [address], [place],  [address]  Ferritin     Status: None   Collection Time: 02/15/20 12:59 AM  Result Value Ref Range   Ferritin 102 24 - 336 ng/mL    Comment: Performed at [facility] [address], [place], [place] [address]  Lactate dehydrogenase     Status: Abnormal  Collection Time: 02/15/20 12:59 AM  Result Value Ref Range   LDH 241 (H) 98 - 192 U/L    Comment: Performed at [facility], [place] [address] [place], [place] [address]  D-dimer, quantitative (not at [facility])     Status: Abnormal   Collection Time: 02/15/20 12:59 AM  Result Value Ref Range   D-Dimer, Quant >20.00 (H) 0.00 - 0.50 ug/mL-FEU    Comment: (NOTE) At the manufacturer cut-off of 0.50 ug/mL FEU, this assay has been documented to exclude PE with a sensitivity and negative predictive value of 97 to 99%.  At this time, this assay has not been approved by the FDA to exclude DVT/VTE. Results should be correlated with clinical presentation. Performed at [facility], [place] [address] [place], [place] [address]   C-reactive protein     Status: Abnormal   Collection Time: 02/15/20 12:59 AM  Result Value Ref Range   CRP 1.0 (H) <1.0 mg/dL    Comment: Performed at [facility] [address] [place], [place] [address]  Troponin I (High Sensitivity)     Status:  Abnormal   Collection Time: 02/15/20 12:59 AM  Result Value Ref Range   Troponin I (High Sensitivity) 27 (H) <18 ng/L    Comment: (NOTE) Elevated high sensitivity troponin I (hsTnI) values and significant  changes across serial measurements may suggest ACS but many other  chronic and acute conditions are known to elevate hsTnI results.  Refer to the "Links" section for chest pain algorithms and additional  guidance. Performed at [facility], [place] [address] [place], [place] [address]   Triglycerides     Status: None   Collection Time: 02/15/20 12:59 AM  Result Value Ref Range   Triglycerides 85 <150 mg/dL    Comment: Performed at [facility] [address] [place], [place] [address]  Procalcitonin     Status: None   Collection Time: 02/15/20  6:25 AM  Result Value Ref Range   Procalcitonin <0.10 ng/mL    Comment:        Interpretation: PCT (Procalcitonin) <= 0.5 ng/mL: Systemic infection (sepsis) is not likely. Local bacterial infection is possible. (NOTE)       Sepsis PCT Algorithm           Lower Respiratory Tract                                      Infection PCT Algorithm    ----------------------------     ----------------------------         PCT < 0.25 ng/mL                PCT < 0.10 ng/mL         Strongly encourage             Strongly discourage   discontinuation of antibiotics    initiation of antibiotics    ----------------------------     -----------------------------       PCT 0.25 - 0.50 ng/mL            PCT 0.10 - 0.25 ng/mL               OR       >80% decrease in PCT            Discourage initiation of  antibiotics      Encourage discontinuation           of antibiotics    ----------------------------     -----------------------------         PCT >= 0.50 ng/mL              PCT 0.26 - 0.50 ng/mL               AND        <80% decrease in PCT             Encourage initiation of                                              antibiotics       Encourage continuation           of antibiotics    ----------------------------     -----------------------------        PCT >= 0.50 ng/mL                  PCT > 0.50 ng/mL               AND         increase in PCT                  Strongly encourage                                      initiation of antibiotics    Strongly encourage escalation           of antibiotics                                     -----------------------------                                           PCT <= 0.25 ng/mL                                                 OR                                        > 80% decrease in PCT                                     Discontinue / Do not initiate                                             antibiotics Performed at [facility], [place] [address] [place], [place] [address]   Heparin level (unfractionated)     Status: Abnormal   Collection Time:  02/15/20 10:38 AM  Result Value Ref Range   Heparin Unfractionated 0.88 (H) 0.30 - 0.70 IU/mL    Comment: (NOTE) If heparin results are below expected values, and patient dosage has  been confirmed, suggest follow up testing of antithrombin III levels. Performed at [facility], [place] [address] [place], [place] [address]   Heparin level (unfractionated)     Status: Abnormal   Collection Time: 02/15/20  8:38 PM  Result Value Ref Range   Heparin Unfractionated 0.75 (H) 0.30 - 0.70 IU/mL    Comment: (NOTE) If heparin results are below expected values, and patient dosage has  been confirmed, suggest follow up testing of antithrombin III levels. Performed at [facility], [place] [address] [place], [place] [address]   CBC     Status: Abnormal   Collection Time: 02/15/20  8:38 PM  Result Value Ref Range   WBC 12.9 (H) 4.0 - 10.5 K/uL   RBC 4.53 4.22 - 5.81 MIL/uL   Hemoglobin 14.5 13.0 - 17.0 g/dL   HCT 46.1 39.0 - 52.0 %   MCV 101.8 (H) 80.0 - 100.0 fL   MCH 32.0  26.0 - 34.0 pg   MCHC 31.5 30.0 - 36.0 g/dL   RDW 14.6 11.5 - 15.5 %   Platelets 182 150 - 400 K/uL   nRBC 0.0 0.0 - 0.2 %    Comment: Performed at [facility], [place] [address] [place], [place] [address]  Protime-INR     Status: Abnormal   Collection Time: 02/16/20  2:40 AM  Result Value Ref Range   Prothrombin Time 15.6 (H) 11.4 - 15.2 seconds   INR 1.3 (H) 0.8 - 1.2    Comment: (NOTE) INR goal varies based on device and disease states. Performed at [facility], [place] [address], [place], [place] [address]   Basic metabolic panel     Status: Abnormal   Collection Time: 02/16/20  2:40 AM  Result Value Ref Range   Sodium 142 135 - 145 mmol/L   Potassium 3.8 3.5 - 5.1 mmol/L   Chloride 99 98 - 111 mmol/L   CO2 32 22 - 32 mmol/L   Glucose, Bld 134 (H) 70 - 99 mg/dL   BUN 21 8 - 23 mg/dL   Creatinine, Ser 0.96 0.61 - 1.24 mg/dL   Calcium 8.9 8.9 - 10.3 mg/dL   GFR calc non Af Amer >60 >60 mL/min   GFR calc Af Amer >60 >60 mL/min   Anion gap 11 5 - 15    Comment: Performed at [facility] [address], [place], [place] [address]  Heparin level (unfractionated)     Status: Abnormal   Collection Time: 02/16/20  2:40 AM  Result Value Ref Range   Heparin Unfractionated 0.24 (L) 0.30 - 0.70 IU/mL    Comment: (NOTE) If heparin results are below expected values, and patient dosage has  been confirmed, suggest follow up testing of antithrombin III levels. Performed at [facility], [place] [address] [place] [address]   CBC     Status: Abnormal   Collection Time: 02/16/20  2:40 AM  Result Value Ref Range   WBC 12.1 (H) 4.0 - 10.5 K/uL   RBC 4.48 4.22 - 5.81 MIL/uL   Hemoglobin 14.5 13.0 - 17.0 g/dL   HCT 46.0 39.0 - 52.0 %   MCV 102.7 (H) 80.0 - 100.0 fL   MCH 32.4 26.0 - 34.0 pg  MCHC 31.5 30.0 - 36.0 g/dL   RDW 14.6 11.5 - 15.5 %   Platelets 198 150 - 400 K/uL   nRBC 0.0 0.0 - 0.2 %    Comment: Performed at [facility], [place]  [address], [place], [place] [address]    DG Chest 2 View  Result Date: 02/14/2020 CLINICAL DATA:  Left-sided abdominal pain EXAM: CHEST - 2 VIEW COMPARISON:  01/24/2020 and 10/06/2019 FINDINGS: Chronic interstitial opacities in the lungs bilaterally, including in the right perihilar region. Left upper hilar retraction. 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 No superimposed focal consolidation. No pleural effusion or pneumothorax. Mild cardiomegaly. Degenerative changes of the visualized thoracolumbar spine. Mild to moderate compression fracture deformities of 4 mid/lower thoracic vertebral bodies, new/progressive from October 2020. 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 No evidence of acute cardiopulmonary disease. Mild to moderate compression fracture deformities of 4 mid/lower thoracic vertebral bodies, new/progressive from October 2020. Electronically Signed   By: Julian Hy M.D.   On: 02/14/2020 15:33   CT Angio Chest PE W and/or Wo Contrast  Result Date: 02/14/2020 CLINICAL DATA:  69 year old male with history of shortness of breath. EXAM: CT ANGIOGRAPHY CHEST WITH CONTRAST TECHNIQUE: Multidetector CT imaging of the chest was performed using the standard protocol during bolus administration of intravenous contrast. Multiplanar CT image reconstructions and MIPs were obtained to evaluate the vascular anatomy. CONTRAST:  47m OMNIPAQUE IOHEXOL 350 MG/ML SOLN COMPARISON:  No priors. FINDINGS: Cardiovascular: There are numerous filling defects within pulmonary artery branches bilaterally, compatible with widespread pulmonary embolism. This includes segmental and subsegmental sized branches, the majority of which are nonocclusive, however, some occlusive embolus in the right lower lobe and left lower lobe is noted. Heart size is normal. There is no significant pericardial fluid, thickening or  pericardial calcification. There is aortic atherosclerosis, as well as atherosclerosis of the great vessels of the mediastinum and the coronary arteries, including calcified atherosclerotic plaque in the left anterior descending, left circumflex and right coronary arteries. Calcifications of the aortic valve. Mediastinum/Nodes: No pathologically enlarged mediastinal or hilar lymph nodes. Esophagus is unremarkable in appearance. No axillary lymphadenopathy. Lungs/Pleura: Extensive architectural distortion is noted throughout the lungs bilaterally, with widespread areas of cylindrical and varicose bronchiectasis, thickening of the peribronchovascular interstitium, chronic volume loss and upward retraction of hilar structures, compatible with reported clinical history of sarcoidosis. Widespread air trapping is also noted, most severe throughout the right middle and lower lobes. No acute consolidative airspace disease. No pleural effusions. No definite suspicious appearing pulmonary nodules or masses are noted. Upper Abdomen: Please see dictation for contemporaneously obtained CT the abdomen and pelvis for full description of relevant findings below the diaphragm. Musculoskeletal: There are no aggressive appearing lytic or blastic lesions noted in the visualized portions of the skeleton. Chronic appearing compression fractures of T6, T8, T9 and T10 are noted, most severe at T8 where there is 60% loss of anterior vertebral body height. Review of the MIP images confirms the above findings. IMPRESSION: 1. Study is positive for pulmonary embolism with a combination of occlusive and nonocclusive embolus in the lower lungs bilaterally ranging from segmental to subsegmental sized vessels. 2. Aortic atherosclerosis, in addition to left main and 3 vessel coronary artery disease. Please note that although the presence of coronary artery calcium documents the presence of coronary artery disease, the severity of this disease and  any potential stenosis cannot be assessed on  this non-gated CT examination. Assessment for potential risk factor modification, dietary therapy or pharmacologic therapy may be warranted, if clinically indicated. 3. There are calcifications of the aortic valve. Echocardiographic correlation for evaluation of potential valvular dysfunction may be warranted if clinically indicated. 4. Imaging findings in the lungs compatible with reported clinical history of sarcoidosis, as above. Critical Value/emergent results were called by telephone at the time of interpretation on 02/14/2020 at 6:40 pm to provider [facility], who verbally acknowledged these results. Aortic Atherosclerosis (ICD10-I70.0). Electronically Signed   By: Vinnie Langton M.D.   On: 02/14/2020 18:41   CT ABDOMEN PELVIS W CONTRAST  Result Date: 02/14/2020 CLINICAL DATA:  70 year old male with history of left upper quadrant abdominal pain. Left-sided flank pain. EXAM: CT ABDOMEN AND PELVIS WITH CONTRAST TECHNIQUE: Multidetector CT imaging of the abdomen and pelvis was performed using the standard protocol following bolus administration of intravenous contrast. CONTRAST:  12m OMNIPAQUE IOHEXOL 300 MG/ML  SOLN COMPARISON:  No priors. FINDINGS: Lower chest: Areas of bronchiectasis and scarring in the lung bases bilaterally. Atherosclerotic calcifications in the left main, left anterior descending, left circumflex and right coronary arteries. Calcifications of the aortic valve. Hepatobiliary: No suspicious cystic or solid hepatic lesions. No intra or extrahepatic biliary ductal dilatation. Multiple peripherally calcified gallstones are noted in the gallbladder. Gallbladder is contracted around these gallstones. No findings to suggest an acute cholecystitis at this time. Pancreas: No pancreatic mass. No pancreatic ductal dilatation. No pancreatic or peripancreatic fluid collections or inflammatory changes. Spleen: Unremarkable. Adrenals/Urinary Tract:  Bilateral kidneys and adrenal glands are normal in appearance. No hydroureteronephrosis. Urinary bladder is normal in appearance. Stomach/Bowel: Normal appearance of the stomach. No pathologic dilatation of small bowel or colon. Numerous colonic diverticulae are noted, without surrounding inflammatory changes to suggest an acute diverticulitis at this time. Normal appendix. Vascular/Lymphatic: Aortic atherosclerosis with aneurysmal dilatation of the right common iliac artery which measures up to 2 cm in diameter. IVC filter in place with tip immediately below the level of the renal veins. No lymphadenopathy noted in the abdomen or pelvis. Reproductive: Prostate gland and seminal vesicles are unremarkable in appearance. Other: No significant volume of ascites.  No pneumoperitoneum. Musculoskeletal: Compression fractures are noted at T8 and T10. At T8 this is most severe where there is 50% loss of anterior vertebral body height and faintly visualized fracture lines, suggesting a subacute injury (no paravertebral soft tissue swelling is noted to suggest an acute injury). There are no aggressive appearing lytic or blastic lesions noted in the visualized portions of the skeleton. IMPRESSION: 1. No acute findings are noted in the abdomen or pelvis to account for the patient's symptoms. 2. However, there is a probable subacute fracture of the T8 vertebral body, as discussed above. 3. Numerous colonic diverticulae are noted, without surrounding inflammatory changes to suggest an acute diverticulitis at this time. 4. Cholelithiasis without evidence of acute cholecystitis at this time. 5. Aortic atherosclerosis, in addition to left main and 3 vessel coronary artery disease. Please note that although the presence of coronary artery calcium documents the presence of coronary artery disease, the severity of this disease and any potential stenosis cannot be assessed on this non-gated CT examination. Assessment for potential risk  factor modification, dietary therapy or pharmacologic therapy may be warranted, if clinically indicated. 6. There are calcifications of the aortic valve. Echocardiographic correlation for evaluation of potential valvular dysfunction may be warranted if clinically indicated. 7. Unusual appearance of the visualized lungs which could be seen in  the setting of a systemic disease such as sarcoidosis. Electronically Signed   By: Vinnie Langton M.D.   On: 02/14/2020 16:00   DG CHEST PORT 1 VIEW  Result Date: 02/15/2020 CLINICAL DATA:  69 year old male with hemoptysis. History of sarcoidosis. EXAM: PORTABLE CHEST 1 VIEW COMPARISON:  Chest radiograph dated 02/14/2020. FINDINGS: Chronic pleuroparenchymal densities with upper lobe predominant similar to prior radiograph in keeping with subcu doses. No new consolidative changes. There is no pleural effusion or pneumothorax. Stable cardiac silhouette. No acute osseous pathology. IMPRESSION: 1. No new consolidation. 2. Chronic pleuroparenchymal densities in keeping with sarcoidosis. Electronically Signed   By: Anner Crete M.D.   On: 02/15/2020 21:35   ECHOCARDIOGRAM LIMITED  Result Date: 02/15/2020    ECHOCARDIOGRAM REPORT   Patient Name:   Jesus Russell Date of Exam: 02/15/2020 Medical Rec #:  [id number]    Height:       68.0 in Accession #:    [id number]   Weight:       225.7 lb Date of Birth:  04/06/1951    BSA:          2.152 m Patient Age:    42 years     BP:           138/97 mmHg Patient Gender: M            HR:           95 bpm. Exam Location:  Inpatient Procedure: Limited Echo STAT ECHO Indications:    Pulmonary embolism  History:        Patient has prior history of Echocardiogram examinations, most                 recent 05/07/2016. Risk Factors:Hypertension. COVID-19                 Obstructive sleep apnea.  Sonographer:    Vikki Ports Turrentine Referring Phys: [id number] Oda Kilts  Sonographer Comments: Image acquisition challenging due to patient body  habitus and Image acquisition challenging due to uncooperative patient. IMPRESSIONS  1. Left ventricular ejection fraction, by estimation, is 60 to 65%. The left ventricle has normal function. The left ventricle has no regional wall motion abnormalities. Left ventricular diastolic parameters are consistent with Grade I diastolic dysfunction (impaired relaxation).  2. Right ventricular systolic function is moderately reduced. The right ventricular size is severely enlarged. Tricuspid regurgitation signal is inadequate for assessing PA pressure.  3. The mitral valve is normal in structure and function. No evidence of mitral valve regurgitation. No evidence of mitral stenosis.  4. The aortic valve is tricuspid. Aortic valve regurgitation is not visualized. Mild to moderate aortic valve sclerosis/calcification is present, without any evidence of aortic stenosis.  5. The inferior vena cava is dilated in size with >50% respiratory variability, suggesting right atrial pressure of 8 mmHg. FINDINGS  Left Ventricle: Left ventricular ejection fraction, by estimation, is 60 to 65%. The left ventricle has normal function. The left ventricle has no regional wall motion abnormalities. The left ventricular internal cavity size was normal in size. There is  no left ventricular hypertrophy. Left ventricular diastolic parameters are consistent with Grade I diastolic dysfunction (impaired relaxation). Normal left ventricular filling pressure. Right Ventricle: The right ventricular size is severely enlarged. No increase in right ventricular wall thickness. Right ventricular systolic function is moderately reduced. Tricuspid regurgitation signal is inadequate for assessing PA pressure. Left Atrium: Left atrial size was normal in size. Right Atrium: Right atrial size was normal  in size. Pericardium: There is no evidence of pericardial effusion. Mitral Valve: The mitral valve is normal in structure and function. Normal mobility of the  mitral valve leaflets. No evidence of mitral valve regurgitation. No evidence of mitral valve stenosis. Tricuspid Valve: The tricuspid valve is normal in structure. Tricuspid valve regurgitation is not demonstrated. No evidence of tricuspid stenosis. Aortic Valve: The aortic valve is tricuspid. Aortic valve regurgitation is not visualized. Mild to moderate aortic valve sclerosis/calcification is present, without any evidence of aortic stenosis. There is moderate calcification of the non coronayr cusp  of the aortic valve. Pulmonic Valve: The pulmonic valve was normal in structure. Pulmonic valve regurgitation is not visualized. No evidence of pulmonic stenosis. Aorta: The aortic root is normal in size and structure. Venous: The inferior vena cava is dilated in size with greater than 50% respiratory variability, suggesting right atrial pressure of 8 mmHg. IAS/Shunts: No atrial level shunt detected by color flow Doppler.  LEFT VENTRICLE PLAX 2D LVIDd:         4.84 cm LVIDs:         3.67 cm LV PW:         0.92 cm LV IVS:        0.94 cm LVOT diam:     2.10 cm LV SV:         36.02 ml LV SV Index:   16.74 LVOT Area:     3.46 cm  LEFT ATRIUM         Index LA diam:    3.60 cm 1.67 cm/m  AORTIC VALVE LVOT Vmax:   64.00 cm/s LVOT Vmean:  39.900 cm/s LVOT VTI:    0.104 m  AORTA Ao Root diam: 3.30 cm MITRAL VALVE MV Area (PHT): 3.30 cm    SHUNTS MV Decel Time: 230 msec    Systemic VTI:  0.10 m MV E velocity: 47.10 cm/s  Systemic Diam: 2.10 cm MV A velocity: 90.00 cm/s MV E/A ratio:  0.52 Fransico Him MD Electronically signed by Fransico Him MD Signature Date/Time: 02/15/2020/8:43:14 AM    Final     Review of Systems  Unable to perform ROS: Mental status change   Blood pressure (!) 119/98, pulse 91, temperature 97.6 F (36.4 C), temperature source Oral, resp. rate 16, height _0  (1.727 m), weight 102.4 kg, SpO2 91 %. Physical Exam  Constitutional: He appears well-developed and well-nourished.  Obtunded, intubated.   HENT:  Head: Normocephalic and atraumatic.  Left Ear: External ear normal.  Mouth/Throat: Oropharynx is clear and moist.  No blood in right nasal passage.  Some clot in left nasal passage.  Granulated area on mid-septum on left.  Eyes: Conjunctivae are normal.  Cardiovascular: Normal rate.  Respiratory:  Artificial respiration  Musculoskeletal:     Cervical back: Normal range of motion and neck supple.  Neurological:  Obtunded.  Skin:  Cool  Psychiatric:  Obtunded    Assessment/Plan: Epistaxis and anti-coagulation  The patient has just been intubated and is moving to ICU.  The left nose was treated and packed prior to transfer.  See the procedure note.  We will leave the pack in place 4-5 days.  Jesus Russell 02/16/2020, 12:27 PM

## 2020-02-16 NOTE — Progress Notes (Signed)
ANTICOAGULATION CONSULT NOTE - Follow Up Consult  Pharmacy Consult for heparin Indication: pulmonary embolus  Labs: Recent Labs    02/14/20 1412 02/14/20 1412 02/14/20 1458 02/14/20 1900 02/15/20 0059 02/15/20 0059 02/15/20 1038 02/15/20 2038 02/16/20 0240  HGB 15.7  15.7   < >  --   --  14.6   < >  --  14.5 14.5  HCT 49.6  49.4   < >  --   --  46.2  --   --  46.1 46.0  PLT 174  167   < >  --   --  172  --   --  182 198  APTT  --   --   --  32  --   --   --   --   --   LABPROT  --   --  15.4*  --   --   --   --   --  15.6*  INR  --   --  1.2  --   --   --   --   --  1.3*  HEPARINUNFRC  --   --   --   --  0.94*   < > 0.88* 0.75* 0.24*  CREATININE 0.86  --   --   --  0.76  --   --   --  0.96  TROPONINIHS  --   --   --   --  27*  --   --   --   --    < > = values in this interval not displayed.    Assessment/Plan:  69yo male subtherapeutic on heparin though pt has been experiencing fairly severe epistaxis despite nasal packing with Afrin per IMTS; heparin is being held for 86mn and IMTS will pack again with TXA. Will resume gtt at current rate and check additional level.   VWynona Neat PharmD, BCPS  02/16/2020,4:03 AM

## 2020-02-16 NOTE — Progress Notes (Signed)
Patient placed on 40% Face tent due to nose bleeding.  Saturation 93-94%.  Therapy tolerated well.

## 2020-02-16 NOTE — Progress Notes (Signed)
Subjective:  Patient seen at bedside this AM and patient with active bleeding/clots from nose. Patient articulated discomfort from this as well as intermittent sharp abdominal pain when he moves.  O/N events: Patient with hemoptysis as well as diffuse nosebleed overnight.  Heparin was paused. Please see below for further discussion  Objective:    Vital Signs (last 24 hours): Vitals:   02/15/20 1556 02/15/20 2020 02/16/20 0149 02/16/20 0150  BP: (!) 122/97 122/80    Pulse: 96 93    Resp: 16 20    Temp: 98.2 F (36.8 C) 97.7 F (36.5 C)    TempSrc: Oral Oral    SpO2: 91% 93% 96% 95%  Weight:      Height:       Physical Exam: General Alert and answers questions appropriately, in distress  Cardiac Regular rate and rhythm, no murmurs, rubs, or gallops  Abdominal Soft, non-tender even to deep palpation, without distention  ENT Bleeding from bilateral nares, clots present    CBC Latest Ref Rng & Units 02/16/2020 02/15/2020 02/15/2020  WBC 4.0 - 10.5 K/uL 12.1(H) 12.9(H) 11.3(H)  Hemoglobin 13.0 - 17.0 g/dL 14.5 14.5 14.6  Hematocrit 39.0 - 52.0 % 46.0 46.1 46.2  Platelets 150 - 400 K/uL 198 182 172   BMP Latest Ref Rng & Units 02/16/2020 02/15/2020 02/14/2020  Glucose 70 - 99 mg/dL 134(H) 118(H) 97  BUN 8 - 23 mg/dL _0 Creatinine 0.61 - 1.24 mg/dL 0.96 0.76 0.86  Sodium 135 - 145 mmol/L 142 142 143  Potassium 3.5 - 5.1 mmol/L 3.8 3.3(L) 4.3  Chloride 98 - 111 mmol/L 99 100 102  CO2 22 - 32 mmol/L 32 28 30  Calcium 8.9 - 10.3 mg/dL 8.9 8.4(L) 8.6(L)     Assessment/Plan:   Principal Problem:   Acute pulmonary embolism with acute cor pulmonale (HCC) Active Problems:   Sarcoidosis   Long term (current) use of anticoagulants   History of pulmonary embolism   Hypertension   Compression fracture of T6 vertebra [facility])  Patient is a 69 year old male with past medical history significant for pulmonary sarcoidosis, positive lupus anticoagulant, pulmonary embolism, DVT, IVC  filter, COVID-19 infection (on 1/13), and hypertension who presented to the ED on 02/15/2020 with left lower chest pain was found to have bilateral occlusive and nonocclusive subsegmental pulmonary embolism on CTA.  # Pulmonary embolism # Epistaxis CT of the chest demonstrated bilateral occlusive and nonocclusive subsegmental PE.  Patient with extensive clotting history, positive lupus anticoagulant. Pulmonary embolism likely secondary to discontinuation of warfarin approximately 4 days ago.  Patient with significant epistaxis following treatment with heparin + initiation of warfarin - with frequent episodes overnight and this AM. Warfarin was stopped today. Given significant clotting history, patient was continued on heparin with goal low-therapeutic range, this was discussed with pharmacy. Rhino rocket placement was attempted by primary team this AM but patient was unable to tolerate placement. At that time, patient was in no respiratory distress, saturating well on room air. ENT was consulted for further evaluation/management. At approximately 12pm, Code Blue was called on patient. Upon our arrival, patient was intubated (per PCCM) and being transferred to ICU. Following intubation, ENT was able to place rhino rocket. In speaking with nursing staff, it appears that patient developed significant respiratory distress while trying to use the bathroom. This may have been secondary to aspiration of clot. PCCM plans for bronchoscopy. * Family has been updated. I spoke with sister, Nonda Lou on phone and updated her  on patient's condition. She is en route to hospital * Further care per PCCM  # Left upper quadrant pain: Patient with left upper quadrant pain which occurs intermittently with position. This may be secondary to pulmonary embolism.  * Plan was to acquire CT abdomen/pelvis following placement of rhino rocket. Further management per PCCM   # HFpEF: TTE with EF 55-98%, grade 1 diastolic dysfunction.   Patient received 40 mg IV Lasix yesterday with net output 2175 mL * Strict I/Os, daily weights  # Sarcoidosis: Patient completed steroid taper. Was on following medications *Breo Ellipta 1 puff daily *Bactrim 400-81 tablet 3 times weekly *Follow-up with pulmonology outpatient  Admit Status: Inpatient, in ICU Dispo: Anticipated discharge pending clinical improvement  Jeanmarie Hubert, MD 02/16/2020, 6:31 AM

## 2020-02-16 NOTE — Consult Note (Addendum)
NAME:  Jesus Russell, MRN:  [id number], DOB:  08/28/1951, LOS: 1 ADMISSION DATE:  02/14/2020, CONSULTATION DATE:  2/23  REFERRING MD:  Rebeca Alert, CHIEF COMPLAINT: acute respiratory failure in setting of massive epistaxis    Brief History   69 year old male with history of sarcoidosis, antiphospholipid antibody syndrome, prior PE and DVT, had been on Coumadin but had stopped this in order to take naproxen.  Admitted 2/21 with acute PE.  Developed worsening epistaxis and progressive respiratory failure requiring intubation on 2/23.  History of present illness    69 year old male who was admitted on 2/21  W/ Left sideWith chief complaint of chest discomfort and shortness of breath.  Also had nonspecific left upper quadrant abdominal pain.  Apparently had stopped taking his warfarin about 4 days prior because he had been on naproxen for back pain.  CT of chest done as part of evaluation demonstrated bilateral pulmonary emboli with nonocclusive pulmonary embolus he was admitted to the internal medicine service, started on anticoagulation. On 9/22 he developed Worsening shortness of breath, also starting to develop hemoptysis ultimately identified is epistaxis.  This continued, became quite profuse on 2/23, they had attempted to place a Rhino Rocket but patient did not tolerate, ultimately he developed acute respiratory distress saying he could not breath, he was emergently intubated and transferred to the intensive care.  Past Medical History  Sarcoidosis, antiphospholipid ab syndrome. Recent PE. Significant Hospital Events   2/21 admitted + PE 2/22 started to have epistaxis.  2/23 epistaxis continued. Had respiratory failure requiring intubation FOB negative for airway obstruction   Consults:  ENT Pulmonary critical care  Procedures:  Emergent intubation 2/23 Bronchoscopy 2/23  Significant Diagnostic Tests:  2/21 CT angio chest: Positive for pulmonary emboli with combination of occlusive and  nonocclusive embolus in the lower lobe lungs bilaterally from segmental to subsegmental vessel size 2/22: Echocardiogram: Demonstrated LVEF 60 to 83% grade 1 diastolic dysfunction RV severely enlarged tricuspid regurgitation RA was normal 2/23 [place] LEs>>>> Micro Data:   Antimicrobials:  unasyn 2/23>>> Interim history/subjective:  Now intubated and sedated  Objective   Blood pressure (Abnormal) 119/98, pulse 91, temperature 97.6 F (36.4 C), temperature source Oral, resp. rate 16, height 5' 8" (1.727 m), weight 102.4 kg, SpO2 91 %.    FiO2 (%):  [40 %] 40 %   Intake/Output Summary (Last 24 hours) at 02/16/2020 1223 Last data filed at 02/16/2020 0428 Gross per 24 hour  Intake 759.29 ml  Output 2975 ml  Net -2215.71 ml   Filed Weights   02/14/20 1401 02/14/20 2203  Weight: 107.3 kg 102.4 kg    Examination: General: This is an obese 69 year old black male currently sedated on ventilator, orally intubated.  HENT:  The left naris packed with a Rhino Rocket, pupils equal reactive mucous membranes moist old dried blood over the face Lungs: Equal bilateral chest rise some scattered rhonchi Cardiovascular: Regular rate and rhythm Abdomen: Soft nontender Extremities: Warm and dry Neuro: Sedated GU: Due to void  Resolved Hospital Problem list     Assessment & Plan:  Acute hypoxic respiratory failure, in the setting of acute aspiration event from epistaxis -Now status post intubation for airway protection -Left naris is packed Plan Follow-up chest x-ray Empiric Unasyn for aspiration and also to empirically cover for nasal packing Full ventilator support We will keep him on ventilator overnight, PAD protocol with RASS goal -2 Follow-up chest x-ray VAP bundle  Epistaxis Plan Nasal packing times 4 to 5 days  Acute pulmonary  embolus, in the setting of subtherapeutic anticoagulation in a patient with known antiphospholipid antibody syndrome and prior pulmonary emboli Plan Holding  anticoagulation for now given life-threatening epistaxis Check lower extremity Dopplers/ultrasound May need to consider repeat echocardiogram  History of sarcoidosis Plan Bronchodilators normally on Breo Ellipta Bactrim 400/81 tablet 3 times a week  Drug related hypotension Became hypotensive after initiation of sedating medications Plan Fluid volume challenge IV phenylephrine for systolic blood pressure goal greater than 100 Hold antihypertensives and diuretics  Subacute T8 fracture -Has been on chronic steroids Plan As needed analgesia   Best practice:  Diet: N.p.o.  pain/Anxiety/Delirium protocol (if indicated): PAD protocol initiated 2/23 VAP protocol (if indicated): Initiated 02/04/2022 DVT prophylaxis: Had been on anticoagulation, pending lower extremity Dopplers currently GI prophylaxis: PPI Glucose control: Not indicated Mobility: Bedrest Code Status: Full code Family Communication: Pending Disposition:  To intensive care for ventilation, sedation, and follow-up post respiratory distress Labs   CBC: Recent Labs  Lab 02/14/20 1412 02/15/20 0059 02/15/20 2038 02/16/20 0240  WBC 12.7*  13.0* 11.3* 12.9* 12.1*  NEUTROABS 9.2* 7.8*  --   --   HGB 15.7  15.7 14.6 14.5 14.5  HCT 49.6  49.4 46.2 46.1 46.0  MCV 101.8*  102.1* 102.0* 101.8* 102.7*  PLT 174  167 172 182 700    Basic Metabolic Panel: Recent Labs  Lab 02/14/20 1412 02/15/20 0059 02/16/20 0240  NA 143 142 142  K 4.3 3.3* 3.8  CL 102 100 99  CO2 30 28 32  GLUCOSE 97 118* 134*  BUN _0 CREATININE 0.86 0.76 0.96  CALCIUM 8.6* 8.4* 8.9  MG  --  1.7  --    GFR: Estimated Creatinine Clearance: 87.8 mL/min (by C-G formula based on SCr of 0.96 mg/dL). Recent Labs  Lab 02/14/20 1412 02/15/20 0059 02/15/20 0625 02/15/20 2038 02/16/20 0240  PROCALCITON  --   --  <0.10  --   --   WBC 12.7*  13.0* 11.3*  --  12.9* 12.1*    Liver Function Tests: Recent Labs  Lab 02/14/20 1412    AST 27  ALT 18  ALKPHOS 89  BILITOT 1.4*  PROT 6.6  ALBUMIN 3.6   Recent Labs  Lab 02/14/20 1412  LIPASE 19   No results for input(s): AMMONIA in the last 168 hours.  ABG    Component Value Date/Time   PHART 7.349 (L) 01/24/2020 1948   PCO2ART 56.9 (H) 01/24/2020 1948   PO2ART 110.0 (H) 01/24/2020 1948   HCO3 31.4 (H) 01/24/2020 1948   TCO2 33 (H) 01/24/2020 1948   O2SAT 98.0 01/24/2020 1948     Coagulation Profile: Recent Labs  Lab 02/14/20 1458 02/16/20 0240  INR 1.2 1.3*    Cardiac Enzymes: No results for input(s): CKTOTAL, CKMB, CKMBINDEX, TROPONINI in the last 168 hours.  HbA1C: Hemoglobin A1C  Date/Time Value Ref Range Status  11/18/2019 12:09 PM 5.8 (A) 4.0 - 5.6 % Final    CBG: No results for input(s): GLUCAP in the last 168 hours.  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   Surgical History    Past Surgical History:  Procedure Laterality Date  . BRAIN SURGERY    .  HERNIA REPAIR       Social History   reports that he quit smoking about 26 years ago. His smoking use included cigarettes. He has a 34.50 pack-year smoking history. He has never used smokeless tobacco. He reports previous drug use. Drug: "Crack" cocaine. He reports that he does not drink alcohol.   Family History   His family history is not on file. He was adopted.   Allergies No Known Allergies   Home Medications  Prior to Admission medications   Medication Sig Start Date End Date Taking? Authorizing Provider  BREO ELLIPTA 200-25 MCG/INH AEPB INHALE 1 PUFF INTO THE LUNGS DAILY Patient taking differently: Inhale 1 puff into  the lungs daily.  11/30/19  Yes Jeanmarie Hubert, MD  cyclobenzaprine (FLEXERIL) 5 MG tablet Take 1 tablet (5 mg total) by mouth 3 (three) times daily as needed for muscle spasms. 01/29/20  Yes Jeanmarie Hubert, MD  losartan-hydrochlorothiazide (HYZAAR) 50-12.5 MG tablet Take 1 tablet by mouth daily. 12/16/19  Yes Jeanmarie Hubert, MD  naproxen (NAPROSYN) 500 MG tablet Take 1 tablet (500 mg total) by mouth 2 (two) times daily with a meal for 14 days. Patient taking differently: Take 500 mg by mouth 2 (two) times daily as needed (for pain).  02/03/20 02/17/20 Yes Agyei, Caprice Kluver, MD  omeprazole (PRILOSEC) 40 MG capsule Take 1 capsule (40 mg total) by mouth daily. Patient taking differently: Take 40 mg by mouth daily before breakfast.  11/09/19  Yes Spero Geralds, MD  OXYGEN Inhale 2 L/min into the lungs at bedtime.   Yes [provider]  sulfamethoxazole-trimethoprim (BACTRIM) 400-80 MG tablet Take 1 tablet by mouth 3 (three) times a week.  02/12/20  Yes [provider]  alendronate (FOSAMAX) 70 MG tablet Take 1 tablet (70 mg total) by mouth every 7 (seven) days. Take with a full glass of water on an empty stomach. 01/26/20 01/25/21  Marianna Payment, MD  cholecalciferol (VITAMIN D3) 25 MCG (1000 UT) tablet Take 1,000 Units by mouth daily.    [provider]  Na Sulfate-K Sulfate-Mg Sulf (SUPREP BOWEL PREP KIT) 17.5-3.13-1.6 GM/177ML SOLN Take 1 kit by mouth as directed. Patient not taking: Reported on 02/14/2020 12/01/19   Thornton Park, MD  predniSONE (DELTASONE) 10 MG tablet Take 30 mg for 2 months, then 20 mg for 2 months, then 10 mg for 2 months Patient not taking: Reported on 02/14/2020 11/18/19   Jeanmarie Hubert, MD  warfarin (COUMADIN) 4 MG tablet TAKE 1/2 OF YOUR 4 MG WARFARIN TABLET ON SUNDAYS, TUESDAYS, THURSDAYS AND SATURDAYS. ALL OTHER DAYS TAKE 1 TABLET Patient taking differently: Take 2-4 mg by mouth See admin instructions. Take 2 mg by mouth in the morning on  Sun/Tues/Thurs/Sat and 4 mg on Mon/Wed/Fri 11/26/19   Pennie Banter, RPH-CPP  zinc gluconate 50 MG tablet Take 50 mg by mouth daily.    [provider]     Critical care time: 32 minutes    [name] ACNP-BC [place] Pager # [phone] OR # [phone] if no answer

## 2020-02-16 NOTE — Code Documentation (Signed)
  Patient Name: Jesus Russell   MRN: [id number]   Date of Birth/ Sex: 1951-11-27 , male      Admission Date: 02/14/2020  Attending Provider: Oda Kilts, MD  Primary Diagnosis: Acute pulmonary embolism with acute cor pulmonale ([place])   Indication: Pt was in his usual state of health until this AM, when he was noted to be using the bedpan when noted to have large episode of epistaxis w/clots. Code blue was subsequently called. At the time of arrival on scene, ACLS protocol was underway. Dr. Tamala Julian at bedside and arranged intubation. ENT also at bedside for [facility] placement.     Technical Description:  - CPR performance duration:  0 minute  - Was defibrillation or cardioversion used? No   - Was external pacer placed? Yes  - Was patient intubated pre/post CPR? No CPR required; patient intubated for respiratory distress and concern for aspiration   Medications Administered: Y = Yes; Blank = No Amiodarone    Atropine    Calcium    Epinephrine    Lidocaine    Magnesium    Norepinephrine    Phenylephrine    Sodium bicarbonate    Vasopressin     Post CPR evaluation:  - Final Status - Was patient successfully resuscitated ? Yes - What is current rhythm? Sinus tachycardia - What is current hemodynamic status? Stable, intubated  Miscellaneous Information:  - Labs sent, including: CXR, ABG  - Primary team notified?  Yes  - Family Notified? Primary team at bedside; will notify family  - Additional notes/ transfer status: Patient intubated and transferred to ICU     Harvie Heck, MD  02/16/2020, 12:11 PM

## 2020-02-17 ENCOUNTER — Inpatient Hospital Stay (HOSPITAL_COMMUNITY): Payer: Medicare Other

## 2020-02-17 DIAGNOSIS — R579 Shock, unspecified: Secondary | ICD-10-CM

## 2020-02-17 DIAGNOSIS — D869 Sarcoidosis, unspecified: Secondary | ICD-10-CM

## 2020-02-17 LAB — POCT I-STAT 7, (LYTES, BLD GAS, ICA,H+H)
Acid-Base Excess: 2 mmol/L (ref 0.0–2.0)
Bicarbonate: 25 mmol/L (ref 20.0–28.0)
Calcium, Ion: 1.02 mmol/L — ABNORMAL LOW (ref 1.15–1.40)
HCT: 34 % — ABNORMAL LOW (ref 39.0–52.0)
Hemoglobin: 11.6 g/dL — ABNORMAL LOW (ref 13.0–17.0)
O2 Saturation: 99 %
Potassium: 3.9 mmol/L (ref 3.5–5.1)
Sodium: 141 mmol/L (ref 135–145)
TCO2: 26 mmol/L (ref 22–32)
pCO2 arterial: 31.8 mmHg — ABNORMAL LOW (ref 32.0–48.0)
pH, Arterial: 7.503 — ABNORMAL HIGH (ref 7.350–7.450)
pO2, Arterial: 110 mmHg — ABNORMAL HIGH (ref 83.0–108.0)

## 2020-02-17 LAB — PROTIME-INR
INR: 1.5 — ABNORMAL HIGH (ref 0.8–1.2)
Prothrombin Time: 17.8 seconds — ABNORMAL HIGH (ref 11.4–15.2)

## 2020-02-17 LAB — BASIC METABOLIC PANEL
Anion gap: 14 (ref 5–15)
Anion gap: 10 (ref 5–15)
BUN: 40 mg/dL — ABNORMAL HIGH (ref 8–23)
BUN: 46 mg/dL — ABNORMAL HIGH (ref 8–23)
CO2: 26 mmol/L (ref 22–32)
CO2: 27 mmol/L (ref 22–32)
Calcium: 7.9 mg/dL — ABNORMAL LOW (ref 8.9–10.3)
Calcium: 7.4 mg/dL — ABNORMAL LOW (ref 8.9–10.3)
Chloride: 104 mmol/L (ref 98–111)
Chloride: 104 mmol/L (ref 98–111)
Creatinine, Ser: 2.5 mg/dL — ABNORMAL HIGH (ref 0.61–1.24)
Creatinine, Ser: 2.86 mg/dL — ABNORMAL HIGH (ref 0.61–1.24)
GFR calc Af Amer: 30 mL/min — ABNORMAL LOW (ref >60)
GFR calc Af Amer: 25 mL/min — ABNORMAL LOW (ref >60)
GFR calc non Af Amer: 26 mL/min — ABNORMAL LOW (ref >60)
GFR calc non Af Amer: 22 mL/min — ABNORMAL LOW (ref >60)
Glucose, Bld: 140 mg/dL — ABNORMAL HIGH (ref 70–99)
Glucose, Bld: 146 mg/dL — ABNORMAL HIGH (ref 70–99)
Potassium: 4.4 mmol/L (ref 3.5–5.1)
Potassium: 4.2 mmol/L (ref 3.5–5.1)
Sodium: 144 mmol/L (ref 135–145)
Sodium: 141 mmol/L (ref 135–145)

## 2020-02-17 LAB — GLUCOSE, CAPILLARY
Glucose-Capillary: 131 mg/dL — ABNORMAL HIGH (ref 70–99)
Glucose-Capillary: 130 mg/dL — ABNORMAL HIGH (ref 70–99)
Glucose-Capillary: 130 mg/dL — ABNORMAL HIGH (ref 70–99)
Glucose-Capillary: 113 mg/dL — ABNORMAL HIGH (ref 70–99)
Glucose-Capillary: 120 mg/dL — ABNORMAL HIGH (ref 70–99)
Glucose-Capillary: 142 mg/dL — ABNORMAL HIGH (ref 70–99)
Glucose-Capillary: 134 mg/dL — ABNORMAL HIGH (ref 70–99)
Glucose-Capillary: 131 mg/dL — ABNORMAL HIGH (ref 70–99)

## 2020-02-17 LAB — PHOSPHORUS
Phosphorus: 3.9 mg/dL (ref 2.5–4.6)
Phosphorus: 5.6 mg/dL — ABNORMAL HIGH (ref 2.5–4.6)

## 2020-02-17 LAB — TRIGLYCERIDES: Triglycerides: 101 mg/dL (ref <150)

## 2020-02-17 LAB — CBC
HCT: 36.2 % — ABNORMAL LOW (ref 39.0–52.0)
Hemoglobin: 11.6 g/dL — ABNORMAL LOW (ref 13.0–17.0)
MCH: 32.5 pg (ref 26.0–34.0)
MCHC: 32 g/dL (ref 30.0–36.0)
MCV: 101.4 fL — ABNORMAL HIGH (ref 80.0–100.0)
Platelets: 154 10*3/uL (ref 150–400)
RBC: 3.57 MIL/uL — ABNORMAL LOW (ref 4.22–5.81)
RDW: 14.8 % (ref 11.5–15.5)
WBC: 19.3 10*3/uL — ABNORMAL HIGH (ref 4.0–10.5)
nRBC: 0.2 % (ref 0.0–0.2)

## 2020-02-17 LAB — HEMOGLOBIN A1C
Hgb A1c MFr Bld: 6.1 % — ABNORMAL HIGH (ref 4.8–5.6)
Mean Plasma Glucose: 128.37 mg/dL

## 2020-02-17 LAB — MAGNESIUM
Magnesium: 1.8 mg/dL (ref 1.7–2.4)
Magnesium: 1.8 mg/dL (ref 1.7–2.4)

## 2020-02-17 LAB — HEPARIN LEVEL (UNFRACTIONATED)
Heparin Unfractionated: 0.47 IU/mL (ref 0.30–0.70)
Heparin Unfractionated: 0.51 IU/mL (ref 0.30–0.70)

## 2020-02-17 MED ORDER — INSULIN ASPART 100 UNIT/ML ~~LOC~~ SOLN
0.0000 [IU] | SUBCUTANEOUS | Status: DC
  Administered 2020-02-17 – 2020-02-24 (×16): 2 [IU] via SUBCUTANEOUS
  Administered 2020-02-26: 3 [IU] via SUBCUTANEOUS
  Administered 2020-02-26: 2 [IU] via SUBCUTANEOUS
  Administered 2020-02-27 (×2): 3 [IU] via SUBCUTANEOUS

## 2020-02-17 MED ORDER — ARFORMOTEROL TARTRATE 15 MCG/2ML IN NEBU
15.0000 ug | INHALATION_SOLUTION | Freq: Two times a day (BID) | RESPIRATORY_TRACT | Status: DC
  Administered 2020-02-17 – 2020-02-29 (×26): 15 ug via RESPIRATORY_TRACT
  Filled 2020-02-17 (×28): qty 2

## 2020-02-17 MED ORDER — CHLORHEXIDINE GLUCONATE 0.12% ORAL RINSE (MEDLINE KIT)
15.0000 mL | Freq: Two times a day (BID) | OROMUCOSAL | Status: DC
  Administered 2020-02-17 – 2020-03-01 (×24): 15 mL via OROMUCOSAL

## 2020-02-17 MED ORDER — PRO-STAT SUGAR FREE PO LIQD
30.0000 mL | Freq: Two times a day (BID) | ORAL | Status: DC
  Administered 2020-02-17 – 2020-02-18 (×3): 30 mL
  Filled 2020-02-17 (×3): qty 30

## 2020-02-17 MED ORDER — BUDESONIDE 0.5 MG/2ML IN SUSP
0.5000 mg | Freq: Two times a day (BID) | RESPIRATORY_TRACT | Status: DC
  Administered 2020-02-17 – 2020-02-29 (×26): 0.5 mg via RESPIRATORY_TRACT
  Filled 2020-02-17 (×27): qty 2

## 2020-02-17 MED ORDER — CHLORHEXIDINE GLUCONATE 0.12% ORAL RINSE (MEDLINE KIT)
15.0000 mL | Freq: Two times a day (BID) | OROMUCOSAL | Status: DC
  Administered 2020-02-17: 02:00:00 15 mL via OROMUCOSAL

## 2020-02-17 MED ORDER — ORAL CARE MOUTH RINSE
15.0000 mL | OROMUCOSAL | Status: DC
  Administered 2020-02-17 – 2020-02-25 (×79): 15 mL via OROMUCOSAL

## 2020-02-17 MED ORDER — SODIUM CHLORIDE 0.9 % IV SOLN
3.0000 g | Freq: Two times a day (BID) | INTRAVENOUS | Status: DC
  Administered 2020-02-17 – 2020-02-19 (×4): 3 g via INTRAVENOUS
  Filled 2020-02-17 (×4): qty 8

## 2020-02-17 MED ORDER — CHLORHEXIDINE GLUCONATE 0.12 % MT SOLN
OROMUCOSAL | Status: AC
  Administered 2020-02-17: 15 mL
  Filled 2020-02-17: qty 15

## 2020-02-17 MED ORDER — ORAL CARE MOUTH RINSE
15.0000 mL | OROMUCOSAL | Status: DC
  Administered 2020-02-17 (×3): 15 mL via OROMUCOSAL

## 2020-02-17 MED ORDER — VITAL HIGH PROTEIN PO LIQD
1000.0000 mL | ORAL | Status: DC
  Administered 2020-02-17 – 2020-02-18 (×2): 1000 mL

## 2020-02-17 NOTE — Progress Notes (Signed)
PT Cancellation Note  Patient Details Name: Jesus Russell MRN: [id number] DOB: Nov 15, 1951   Cancelled Treatment:    Reason Eval/Treat Not Completed: Medical issues which prohibited therapy;Other (comment). Pt intubated and sedated. Please re-order PT once pt is medically appropriate for participation in evaluation.    Clearnce Sorrel Samatha Anspach 02/17/2020, 9:51 AM

## 2020-02-17 NOTE — Progress Notes (Addendum)
NAME:  Kasin Tonkinson, MRN:  [id number], DOB:  1951/10/17, LOS: 2 ADMISSION DATE:  02/14/2020, CONSULTATION DATE:  2/23  REFERRING MD:  Rebeca Alert, CHIEF COMPLAINT: acute respiratory failure in setting of massive epistaxis    Brief History   69 year old male with history of sarcoidosis, antiphospholipid antibody syndrome, prior PE and DVT, had been on Coumadin but had stopped this in order to take naproxen.  Admitted 2/21 with acute PE.  Developed worsening epistaxis and progressive respiratory failure requiring intubation on 2/23.   Past Medical History  Sarcoidosis, antiphospholipid ab syndrome. Recent PE. Significant Hospital Events   2/21 admitted + PE 2/22 started to have epistaxis.  2/23 epistaxis continued. Left nare packed.  Had respiratory failure requiring intubation FOB negative for airway obstruction. ECHO w/ new RV failure suggesting progression of PE. Had Obstructive shock physiology. Heparin restarted. Supported on [employer]. + DVT BLEs.  2/24 pressor requirements improving. Not ready to wean. New AKI. But bleeding seems to have stopped.   Consults:  ENT Pulmonary critical care  Procedures:  Emergent intubation 2/23 Bronchoscopy 2/23 Left [place] cvl 2/23>>  Significant Diagnostic Tests:  2/21 CT angio chest: Positive for pulmonary emboli with combination of occlusive and nonocclusive embolus in the lower lobe lungs bilaterally from segmental to subsegmental vessel size 2/22: Echocardiogram: Demonstrated LVEF 60 to 01% grade 1 diastolic dysfunction RV severely enlarged tricuspid regurgitation RA was normal 2/23 [place] LEs>>>>- Findings consistent with acute deep vein thrombosis involving the right common femoral vein, SF junction, right femoral vein, right proximal profunda vein, right popliteal vein, right posterior tibial veins, and right gastrocnemius veins- Findings consistent with acute deep vein thrombosis involving the left posterior tibial veins. 2/23 echo: 1. Left ventricular  ejection fraction, by estimation, is 50 to 55%. The  left ventricle has low normal function. The left ventricle has no regional  wall motion abnormalities.  2. The images are very technically difficult . The RV could only be seen  using Definity contrast. The RV appears to be very dilated and with  severely reduced RV function There is moderate pulmonary artery hypertension . Right ventricular  systolic function is severely reduced. The right ventricular size is  severely enlarged. There is moderately elevated pulmonary artery systolic pressure.    Micro Data:   Antimicrobials:  unasyn 2/23>>> Interim history/subjective:  Sedated and intubated   Objective   Blood pressure 95/85, pulse 92, temperature 98.8 F (37.1 C), temperature source Oral, resp. rate (Abnormal) 22, height 5' 8" (1.727 m), weight 99.2 kg, SpO2 96 %. CVP:  [10 mmHg-17 mmHg] 11 mmHg  Vent Mode: PRVC FiO2 (%):  [40 %-100 %] 40 % Set Rate:  [16 bmp-22 bmp] 22 bmp Vt Set:  [540 mL] 540 mL PEEP:  [5 cmH20] 5 cmH20 Plateau Pressure:  [19 cmH20-23 cmH20] 20 cmH20   Intake/Output Summary (Last 24 hours) at 02/17/2020 0749 Last data filed at 02/17/2020 0700 Gross per 24 hour  Intake 1425.71 ml  Output 280 ml  Net 1145.71 ml   Filed Weights   02/14/20 1401 02/14/20 2203 02/17/20 0322  Weight: 107.3 kg 102.4 kg 99.2 kg    Examination: General this is a 69 year old black male. He is sedated on vent.  HENT orally intubated. Pupils pinpoint but reactive. Left naris is packed Pulm scattered rhonchi. Equal chest rise w/out accessory use Vts 350 on PSV attempt w/ 14 cmH20 Card rrr no MRG abd not tender Ext LE edema warm pulses palp Neuro sedated but agitated during  wakeup assessment. Will attempt to get OOB GU no UOP last night. Bladder scan < 200 ml   Resolved Hospital Problem list     Assessment & Plan:  Acute hypoxic respiratory failure, in setting of acute PE +/- aspiration event.  Airways were clean on  bronch PCXR personally reviewed: ett and CVL in good position. Fairly chronic interstitial changes. R>L does have left base volume loss. Suspect atx -volumes are a little low on PSV of 14. Not ready to come off vent yet. Would like to give him another 24 hrs w/out bleeding.  Plan Cont full vent support w/ daily assessment for weaning/SBT VAP bundle PAD protocol-->change RASS goal to 0 to -1 if able Scheduled brovana and duoneb Am cxr rx PE  Acute pulmonary embolus w/ bilateral acute DVT, in the setting of subtherapeutic anticoagulation in a patient with known antiphospholipid antibody syndrome and prior pulmonary emboli Plan Cont IV heparin    Epistaxis ->bleeding seems controlled. Hgb stable Plan Nasal packing. Day 2/4 to 5 Day 2 Unasyn (will cont as long as packing remains at least)  Circulatory shock/obstructive shock 2/2 PE w/ acute right Ventricular Failure c/b underlying chronic PH -initially thought that this was 2/2 to drug related hypotension; repeat ECHO showed RV acutely dilated w/ severely reduced RV fxn on 2/23 Plan Cont to titrate norepi for MAP > 65 CVP goal 8-12 Cont tele  Treating acute PE w/ IV heparin  Day 2 stress dose steroids  AKI suspect ATN 2/2 decreased end-organ perfusion in shock state.  Plan Ensure MAP > 65 Renal dose meds Strict I&O-->place foley given AKI Am chemistry  History of sarcoidosis Plan Cont BDs (at home is on Breo epllipta)  Stop bactrim his pred dosing really doesn't support the need for this   Subacute T8 fracture -Has been on chronic steroids Plan Cont PRN fent. Previously was on Flexeril and pred.   Best practice:  Diet: N.p.o. ->tubefeed  pain/Anxiety/Delirium protocol (if indicated): PAD protocol initiated 2/23 VAP protocol (if indicated): Initiated 02/04/2022 DVT prophylaxis: Had been on anticoagulation, pending lower extremity Dopplers currently GI prophylaxis: PPI Glucose control: Not indicated Mobility:  Bedrest Code Status: Full code Family Communication: Pending Disposition:  Cont critical care support in ICU. Weaning pressors. Hope begin vent wean in 24 hrs or so. Will update sister later today    Critical care time: 31 minutes.     [name] ACNP-BC [place] Pager # [phone] OR # [phone] if no answer    Attending Note:  I have examined patient, reviewed labs, studies and notes.   69 year old man with sarcoidosis, antiphospholipid antibody syndrome with a history of chronic thromboembolic disease.  Formally on Coumadin, recently stopped.  Admitted on 2/21 with acute pulmonary embolism.  Transferred to the ICU with progressive acute respiratory failure, epistaxis and cardiogenic shock.  Echocardiogram with evidence for progressive RV failure in the setting of his acute PE.  Required norepinephrine.  His left nare was packed successfully.  Heparin restarted without complication.  Course complicated by progressive acute renal failure.  On 2/24 his pressor needs are improving, remains ventilated.   Vitals:   02/17/20 0700 02/17/20 0716 02/17/20 0747 02/17/20 0800  BP: 97/81  95/85 100/80  Pulse: 87  92 89  Resp: (!) 22  (!) 22 (!) 22  Temp:  98.8 F (37.1 C)    TempSrc:  Oral    SpO2: 100%  96% 100%  Weight:      Height:  Ill-appearing obese man, sedated and laying in bed.  He does not open eyes to voice or stimulation, is obtunded on fentanyl and low-dose propofol.  Lungs are distant, clear.  He has packing in his left nare without any evidence of active bleeding.  Heart is distant, regular, borderline tachycardic.  Abdomen is obese, nondistended with positive bowel sounds.  He has 1+ lower extremity edema bilaterally.  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 Now tolerating heparin without any evidence for recurrent bleeding.  Plan to continue.  Plan to follow CBC.  He was on Coumadin at home, plan to either restart this or move to a DOAC  Right  heart failure and cardiogenic shock Due to acute pulmonary embolism.  Anticoagulation as above.  Weaning norepinephrine.  His hemodynamics have improved and do not believe he is going to require lytics if trending continues.  He will need repeat echocardiogram in the next few months to assess RV recovery.  Epistaxis Appreciate ENT assistance with packing.  Plan to remove on 2/25.  Continue Unasyn for now, likely DC once packing safely removed  Acute renal failure, S Cr 2.5 on 2/24.  Follow urine output, BMP with restoration of adequate perfusion pressures. Avoid nephrotoxins and dose medications for his renal function  Chronic steroids in the setting of a subacute T8 fracture Currently on stress dose hydrocortisone  History of sarcoidosis and associated obstructive lung disease. Bronchodilators, Pulmicort nebs  Independent critical care time is 32 minutes.   Baltazar Apo, MD, PhD 02/17/2020, 10:33 AM [facility] [phone] or if no answer [phone]

## 2020-02-17 NOTE — Progress Notes (Signed)
PHARMACY NOTE:  ANTIMICROBIAL RENAL DOSAGE ADJUSTMENT  Current antimicrobial regimen includes a mismatch between antimicrobial dosage and estimated renal function.  As per policy approved by the Pharmacy & Therapeutics and Medical Executive Committees, the antimicrobial dosage will be adjusted accordingly.  Current antimicrobial dosage:  Unasyn 3g IV q6h   Indication: aspiration pneumonia, risk of TSS  Renal Function:  Estimated Creatinine Clearance: 33.2 mL/min (A) (by C-G formula based on SCr of 2.5 mg/dL (H)). _0      On intermittent HD, scheduled: _1      On CRRT    Antimicrobial dosage has been changed to:  Unasyn 3g IV q12h   Additional comments: Patient CrCl technically qualifies for q6h dosing still but given report from RN that patient has produced no urine since yesterday, his significant jump in Scr and need for pressors still I expect his renal function is worse than 49m/min. Will adjust to q12h at this time and follow for need to increase tomorrow if improves   Thank you for allowing pharmacy to be a part of this patient's care.  TPhillis Haggis R[facility]2/24/2021 7:54 AM

## 2020-02-17 NOTE — Progress Notes (Signed)
ANTICOAGULATION CONSULT NOTE  Pharmacy Consult for heparin Indication: pulmonary embolus  No Known Allergies  Patient Measurements: Height: _0  (172.7 cm) Weight: 225 lb 12 oz (102.4 kg) IBW/kg (Calculated) : 68.4 Heparin Dosing Weight: 94 kg  Vital Signs: Temp: 98.9 F (37.2 C) (02/23 2344) Temp Source: Oral (02/23 2344) BP: 111/95 (02/24 0000) Pulse Rate: 98 (02/23 2305)  Labs: Recent Labs    02/14/20 1412 02/14/20 1458 02/14/20 1900 02/15/20 0059 02/15/20 1038 02/16/20 0240 02/16/20 0240 02/16/20 1135 02/16/20 1400 02/16/20 1400 02/16/20 1516 02/16/20 1843 02/16/20 2314  HGB   < >  --   --  14.6   < > 14.5   < >  --  12.2*   < > 12.2*  --  12.3*  HCT   < >  --   --  46.2   < > 46.0   < >  --  36.0*  --  39.0  --  38.6*  PLT   < >  --   --  172   < > 198  --   --   --   --  142*  --  155  APTT  --   --  32  --   --   --   --   --   --   --   --   --   --   LABPROT  --  15.4*  --   --   --  15.6*  --   --   --   --   --   --   --   INR  --  1.2  --   --   --  1.3*  --   --   --   --   --   --   --   HEPARINUNFRC  --   --   --  0.94*   < > 0.24*  --  0.19*  --   --   --   --  0.47  CREATININE   < >  --   --  0.76  --  0.96  --   --   --   --  1.73* 1.90*  --   TROPONINIHS  --   --   --  27*  --   --   --   --   --   --   --   --   --    < > = values in this interval not displayed.    Estimated Creatinine Clearance: 44.4 mL/min (A) (by C-G formula based on SCr of 1.9 mg/dL (H)).   Assessment: 69 yr old male presented with abdominal pain/SOB, hx COVID infection in January, now with bilateral PE on CT.  On warfarin PTA for hx VTE, INR 1.2 on admission.    2/22 night and 2/23 morning the patient has been experiencing epistaxis and the heparin was continued until the patient experienced life threatening bleeding 2/23 afternoon and a code blue was called. The nose was packed and cauterized by ENT at this time the patient was emergently intubated and brought to the  ICU.   Per noted new DVT and RV strain on echo. Heparin restarted 2/23 ~1600. Hgb stable at 12.3. Plt 155.   Heparin level therapeutic (0.47) on gtt at 900 units/hr. RN notes some blood from OGT but stable since she came on last night.  Goal of Therapy:  Heparin level 0.3-0.5 units/ml Monitor platelets by anticoagulation protocol: Yes  Plan:  -Continue heparin at 900 units/hr -Daily heparin level with CBC daily  Sherlon Handing, PharmD, BCPS Please see amion for complete clinical pharmacist phone list 02/17/2020 12:24 AM

## 2020-02-17 NOTE — Progress Notes (Signed)
Initial Nutrition Assessment  DOCUMENTATION CODES:   Obesity unspecified  INTERVENTION:    Vital High Protein at 45 ml/h (1080 ml per day)   Pro-stat 30 ml TID   Provides 1380 kcal (1494 kcal total with propofol), 140 gm protein, 903 ml free water daily  NUTRITION DIAGNOSIS:   Inadequate oral intake related to inability to eat as evidenced by NPO status.  GOAL:   Provide needs based on ASPEN/SCCM guidelines  MONITOR:   Vent status, TF tolerance, Labs  REASON FOR ASSESSMENT:   Ventilator, Consult Enteral/tube feeding initiation and management  ASSESSMENT:   69 yo male admitted with PE. Developed acute respiratory failure in the setting of massive epistaxis and was intubated on 2/23. PMH includes HTN, sarcoidosis, antiphospholipid antibody syndrome.   Prior to intubation, patient was on a regular diet consuming 75-100% of meals.   Received MD Consult for TF initiation and management.  Patient is currently intubated on ventilator support. MV: 11.6 L/min Temp (24hrs), Avg:98.6 F (37 C), Min:97.6 F (36.4 C), Max:98.9 F (37.2 C)  Propofol: 4.3 ml/hr providing 114 kcal from lipid  Labs reviewed. BUN 40, Creat 2.5, A1C 6.1 CBG's: 131-130-130  Medications reviewed and include vitamin D3, solu-cortef, novolog, levophed, propofol.  Usual weights reviewed. Patient has had a significant amount of weight loss, down 6% within the past month. Patient with some edema, so actual weight is likely lower than today's weight of 99.2 kg.  NUTRITION - FOCUSED PHYSICAL EXAM:    Most Recent Value  Orbital Region  Unable to assess  Upper Arm Region  No depletion  Thoracic and Lumbar Region  No depletion  Buccal Region  Unable to assess  Temple Region  No depletion  Clavicle Bone Region  No depletion  Clavicle and Acromion Bone Region  No depletion  Scapular Bone Region  Unable to assess  Dorsal Hand  No depletion  Patellar Region  No depletion  Anterior Thigh Region  No  depletion  Posterior Calf Region  No depletion  Edema (RD Assessment)  Mild  Hair  Reviewed  Eyes  Unable to assess  Mouth  Unable to assess  Skin  Reviewed  Nails  Reviewed       Diet Order:   Diet Order            Diet NPO time specified  Diet effective now              EDUCATION NEEDS:   Not appropriate for education at this time  Skin:  Skin Assessment: Reviewed RN Assessment  Last BM:  2/23  Height:   Ht Readings from Last 1 Encounters:  02/16/20 _0  (1.727 m)    Weight:   Wt Readings from Last 1 Encounters:  02/17/20 99.2 kg    Ideal Body Weight:  70 kg  BMI:  Body mass index is 33.25 kg/m.  Estimated Nutritional Needs:   Kcal:  5176-1607  Protein:  140 gm  Fluid:  >/= 1.8 L    Molli Barrows, RD, LDN, CNSC Please refer to Amion for contact information.

## 2020-02-17 NOTE — Progress Notes (Signed)
OT Cancellation Note  Patient Details Name: Jesus Russell MRN: [id number] DOB: [birth year]   Cancelled Treatment:    Reason Eval/Treat Not Completed: Medical issues which prohibited therapy(pt sedated and intubated, signing off and awaiting new order.   [name], [name] 02/17/2020, 10:06 AM  [name], OTR/L Acute Rehabilitation Services Pager: [phone] Office: [phone]

## 2020-02-17 NOTE — Progress Notes (Signed)
[place] for heparin Indication: pulmonary embolus  No Known Allergies  Patient Measurements: Height: _0  (172.7 cm) Weight: 218 lb 11.1 oz (99.2 kg) IBW/kg (Calculated) : 68.4 Heparin Dosing Weight: 94 kg  Vital Signs: Temp: 98.8 F (37.1 C) (02/24 0716) Temp Source: Oral (02/24 0716) BP: 97/81 (02/24 0700) Pulse Rate: 87 (02/24 0700)  Labs: Recent Labs    02/14/20 1412 02/14/20 1458 02/14/20 1900 02/15/20 0059 02/15/20 1038 02/16/20 0240 02/16/20 0240 02/16/20 1135 02/16/20 1400 02/16/20 1516 02/16/20 1516 02/16/20 1843 02/16/20 2314 02/17/20 0459  HGB   < >  --   --  14.6   < > 14.5  --   --    < > 12.2*   < >  --  12.3* 11.6*  HCT   < >  --   --  46.2   < > 46.0  --   --    < > 39.0  --   --  38.6* 36.2*  PLT   < >  --   --  172   < > 198   < >  --   --  142*  --   --  155 154  APTT  --   --  32  --   --   --   --   --   --   --   --   --   --   --   LABPROT  --  15.4*  --   --   --  15.6*  --   --   --   --   --   --   --  17.8*  INR  --  1.2  --   --   --  1.3*  --   --   --   --   --   --   --  1.5*  HEPARINUNFRC  --   --   --  0.94*   < > 0.24*   < > 0.19*  --   --   --   --  0.47 0.51  CREATININE   < >  --   --  0.76  --  0.96   < >  --   --  1.73*  --  1.90*  --  2.50*  TROPONINIHS  --   --   --  27*  --   --   --   --   --   --   --   --   --   --    < > = values in this interval not displayed.    Estimated Creatinine Clearance: 33.2 mL/min (A) (by C-G formula based on SCr of 2.5 mg/dL (H)).   Assessment: 69 yr old male presented with abdominal pain/SOB, hx COVID infection in January, now with bilateral PE on CT.  On warfarin PTA for hx VTE, INR 1.2 on admission.    2/22 night and 2/23 morning the patient has been experiencing epistaxis and the heparin was continued until the patient experienced life threatening bleeding 2/23 afternoon and a code blue was called. The nose was packed and cauterized by ENT at this  time the patient was emergently intubated and brought to the ICU.   Per noted new DVT and RV strain on echo. Heparin restarted 2/23 ~1600. Hgb stable at 12.3. Plt 155.   Heparin level therapeutic (0.51) on gtt at 900 units/hr, only slightly elevated from the reduced goal  of 0.3-0.5 will continue at this rate. No significant bleeding overnight.  Goal of Therapy:  Heparin level 0.3-0.5 units/ml Monitor platelets by anticoagulation protocol: Yes   Plan:  -Continue heparin at 900 units/hr -Daily heparin level with CBC daily  Nicoletta Dress, PharmD PGY2 Infectious Disease Pharmacy Resident  02/17/2020 7:42 AM

## 2020-02-18 ENCOUNTER — Inpatient Hospital Stay (HOSPITAL_COMMUNITY): Admission: RE | Admit: 2020-02-18 | Payer: Medicare Other | Source: Ambulatory Visit

## 2020-02-18 DIAGNOSIS — R579 Shock, unspecified: Secondary | ICD-10-CM

## 2020-02-18 LAB — PROTIME-INR
INR: 1.3 — ABNORMAL HIGH (ref 0.8–1.2)
Prothrombin Time: 15.9 seconds — ABNORMAL HIGH (ref 11.4–15.2)

## 2020-02-18 LAB — CBC
HCT: 33.3 % — ABNORMAL LOW (ref 39.0–52.0)
HCT: 33.1 % — ABNORMAL LOW (ref 39.0–52.0)
Hemoglobin: 10.4 g/dL — ABNORMAL LOW (ref 13.0–17.0)
Hemoglobin: 10.1 g/dL — ABNORMAL LOW (ref 13.0–17.0)
MCH: 32.6 pg (ref 26.0–34.0)
MCH: 31.9 pg (ref 26.0–34.0)
MCHC: 31.2 g/dL (ref 30.0–36.0)
MCHC: 30.5 g/dL (ref 30.0–36.0)
MCV: 104.4 fL — ABNORMAL HIGH (ref 80.0–100.0)
MCV: 104.4 fL — ABNORMAL HIGH (ref 80.0–100.0)
Platelets: 143 10*3/uL — ABNORMAL LOW (ref 150–400)
Platelets: 144 10*3/uL — ABNORMAL LOW (ref 150–400)
RBC: 3.19 MIL/uL — ABNORMAL LOW (ref 4.22–5.81)
RBC: 3.17 MIL/uL — ABNORMAL LOW (ref 4.22–5.81)
RDW: 15.3 % (ref 11.5–15.5)
RDW: 15.4 % (ref 11.5–15.5)
WBC: 19.2 10*3/uL — ABNORMAL HIGH (ref 4.0–10.5)
WBC: 19.3 10*3/uL — ABNORMAL HIGH (ref 4.0–10.5)
nRBC: 0.2 % (ref 0.0–0.2)
nRBC: 0.2 % (ref 0.0–0.2)

## 2020-02-18 LAB — GLUCOSE, CAPILLARY
Glucose-Capillary: 128 mg/dL — ABNORMAL HIGH (ref 70–99)
Glucose-Capillary: 130 mg/dL — ABNORMAL HIGH (ref 70–99)
Glucose-Capillary: 130 mg/dL — ABNORMAL HIGH (ref 70–99)
Glucose-Capillary: 142 mg/dL — ABNORMAL HIGH (ref 70–99)
Glucose-Capillary: 133 mg/dL — ABNORMAL HIGH (ref 70–99)
Glucose-Capillary: 122 mg/dL — ABNORMAL HIGH (ref 70–99)
Glucose-Capillary: 126 mg/dL — ABNORMAL HIGH (ref 70–99)

## 2020-02-18 LAB — MAGNESIUM
Magnesium: 1.9 mg/dL (ref 1.7–2.4)
Magnesium: 2 mg/dL (ref 1.7–2.4)

## 2020-02-18 LAB — HEPARIN LEVEL (UNFRACTIONATED)
Heparin Unfractionated: 0.72 IU/mL — ABNORMAL HIGH (ref 0.30–0.70)
Heparin Unfractionated: 0.53 IU/mL (ref 0.30–0.70)

## 2020-02-18 LAB — BASIC METABOLIC PANEL
Anion gap: 11 (ref 5–15)
BUN: 59 mg/dL — ABNORMAL HIGH (ref 8–23)
CO2: 27 mmol/L (ref 22–32)
Calcium: 7.4 mg/dL — ABNORMAL LOW (ref 8.9–10.3)
Chloride: 103 mmol/L (ref 98–111)
Creatinine, Ser: 3.12 mg/dL — ABNORMAL HIGH (ref 0.61–1.24)
GFR calc Af Amer: 23 mL/min — ABNORMAL LOW (ref >60)
GFR calc non Af Amer: 20 mL/min — ABNORMAL LOW (ref >60)
Glucose, Bld: 143 mg/dL — ABNORMAL HIGH (ref 70–99)
Potassium: 4.3 mmol/L (ref 3.5–5.1)
Sodium: 141 mmol/L (ref 135–145)

## 2020-02-18 LAB — PHOSPHORUS
Phosphorus: 5.9 mg/dL — ABNORMAL HIGH (ref 2.5–4.6)
Phosphorus: 6.3 mg/dL — ABNORMAL HIGH (ref 2.5–4.6)

## 2020-02-18 LAB — TRIGLYCERIDES: Triglycerides: 125 mg/dL (ref <150)

## 2020-02-18 MED ORDER — PRO-STAT SUGAR FREE PO LIQD
30.0000 mL | Freq: Three times a day (TID) | ORAL | Status: DC
  Administered 2020-02-18 – 2020-02-19 (×5): 30 mL
  Filled 2020-02-18 (×5): qty 30

## 2020-02-18 MED ORDER — FUROSEMIDE 10 MG/ML IJ SOLN
40.0000 mg | Freq: Once | INTRAMUSCULAR | Status: AC
  Administered 2020-02-18: 40 mg via INTRAVENOUS
  Filled 2020-02-18: qty 4

## 2020-02-18 MED ORDER — VITAL HIGH PROTEIN PO LIQD
1000.0000 mL | ORAL | Status: DC
  Administered 2020-02-18 – 2020-02-19 (×2): 1000 mL

## 2020-02-18 NOTE — Progress Notes (Signed)
[facility] Progress Note Patient Name: Jesus Russell DOB: 1951-03-03 MRN: [id number]   Date of Service  02/18/2020  HPI/Events of Note  Oliguria - CVP = 17. Creatinine = 2.85.  eICU Interventions  Will order: 1. Lasix 40 mg IV now.      Intervention Category Major Interventions: Other:  [name] [name] 02/18/2020, 5:03 AM

## 2020-02-18 NOTE — Progress Notes (Signed)
ANTICOAGULATION CONSULT NOTE  Pharmacy Consult for heparin Indication: pulmonary embolus  No Known Allergies  Patient Measurements: Height: _0  (172.7 cm) Weight: 224 lb 3.3 oz (101.7 kg) IBW/kg (Calculated) : 68.4 Heparin Dosing Weight: 94 kg  Vital Signs: Temp: 98.3 F (36.8 C) (02/25 0724) Temp Source: Oral (02/25 0724) BP: 97/77 (02/25 0754) Pulse Rate: 99 (02/25 0754)  Labs: Recent Labs    02/16/20 0240 02/16/20 1135 02/16/20 2314 02/16/20 2314 02/17/20 0459 02/17/20 0459 02/17/20 0836 02/17/20 0836 02/17/20 1612 02/18/20 0132 02/18/20 0252  HGB 14.5   < > 12.3*   < > 11.6*   < > 11.6*   < >  --  10.4* 10.1*  HCT 46.0   < > 38.6*   < > 36.2*   < > 34.0*  --   --  33.3* 33.1*  PLT 198   < > 155   < > 154  --   --   --   --  143* 144*  LABPROT 15.6*  --   --   --  17.8*  --   --   --   --   --  15.9*  INR 1.3*  --   --   --  1.5*  --   --   --   --   --  1.3*  HEPARINUNFRC 0.24*   < > 0.47  --  0.51  --   --   --   --   --  0.72*  CREATININE 0.96   < >  --    < > 2.50*  --   --   --  2.86*  --  3.12*   < > = values in this interval not displayed.    Estimated Creatinine Clearance: 26.9 mL/min (A) (by C-G formula based on SCr of 3.12 mg/dL (H)).   Assessment: 69 yr old male presented with abdominal pain/SOB, hx COVID infection in January, now with bilateral PE on CT.  On warfarin PTA for hx VTE, INR 1.2 on admission.    2/22 night and 2/23 morning the patient has been experiencing epistaxis and the heparin was continued until the patient experienced life threatening bleeding 2/23 afternoon and a code blue was called. The nose was packed and cauterized by ENT at this time the patient was emergently intubated and brought to the ICU.   Per noted new DVT and RV strain on echo. Heparin restarted 2/23 ~1600. Hgb stable at 12.3. Plt 155.   Heparin level therapeutic (0.51) on gtt at 900 units/hr on 2/24. This morning HL elevated at 0.72, will decrease his rate to 800  units/hr and recheck a level this afternoon.   Goal of Therapy:  Heparin level 0.3-0.5 units/ml Monitor platelets by anticoagulation protocol: Yes   Plan:  -Decrease heparin to 800 units/hr -Recheck HL at 1430 -Daily heparin level with CBC daily  Nicoletta Dress, PharmD PGY2 Infectious Disease Pharmacy Resident  02/18/2020 8:08 AM

## 2020-02-18 NOTE — Progress Notes (Signed)
ANTICOAGULATION CONSULT NOTE  Pharmacy Consult for heparin Indication: pulmonary embolus  No Known Allergies  Patient Measurements: Height: 5' 8" (172.7 cm) Weight: 224 lb 3.3 oz (101.7 kg) IBW/kg (Calculated) : 68.4 Heparin Dosing Weight: 94 kg  Vital Signs: Temp: 98.4 F (36.9 C) (02/25 1111) Temp Source: Axillary (02/25 1111) BP: 102/91 (02/25 1135) Pulse Rate: 94 (02/25 1135)  Labs: Recent Labs    02/16/20 0240 02/16/20 1135 02/17/20 0459 02/17/20 0459 02/17/20 0836 02/17/20 0836 02/17/20 1612 02/18/20 0132 02/18/20 0252 02/18/20 1400  HGB 14.5   < > 11.6*   < > 11.6*   < >  --  10.4* 10.1*  --   HCT 46.0   < > 36.2*   < > 34.0*  --   --  33.3* 33.1*  --   PLT 198   < > 154  --   --   --   --  143* 144*  --   LABPROT 15.6*  --  17.8*  --   --   --   --   --  15.9*  --   INR 1.3*  --  1.5*  --   --   --   --   --  1.3*  --   HEPARINUNFRC 0.24*   < > 0.51  --   --   --   --   --  0.72* 0.53  CREATININE 0.96   < > 2.50*  --   --   --  2.86*  --  3.12*  --    < > = values in this interval not displayed.    Estimated Creatinine Clearance: 26.9 mL/min (A) (by C-G formula based on SCr of 3.12 mg/dL (H)).   Assessment: 69 yr old male presented with abdominal pain/SOB, hx COVID infection in January, now with bilateral PE on CT.  On warfarin PTA for hx VTE, INR 1.2 on admission.    2/22 night and 2/23 morning the patient has been experiencing epistaxis and the heparin was continued until the patient experienced life threatening bleeding 2/23 afternoon and a code blue was called. The nose was packed and cauterized by ENT at this time the patient was emergently intubated and brought to the ICU.   Per noted new DVT and RV strain on echo. Heparin restarted 2/23 ~1600. Hgb stable at 12.3. Plt 155.   Heparin level therapeutic (0.51) on gtt at 900 units/hr on 2/24. This morning HL elevated at 0.72, decreased his rate to 800 units/hr and repeat level was 0.53. I will continue  his heparin at this rate and recheck level in the morning.  Goal of Therapy:  Heparin level 0.3-0.5 units/ml Monitor platelets by anticoagulation protocol: Yes   Plan:  -Continue heparin at 800 units/hr -Daily heparin level with CBC daily  Nicoletta Dress, PharmD PGY2 Infectious Disease Pharmacy Resident  02/18/2020 2:35 PM

## 2020-02-18 NOTE — Progress Notes (Signed)
NAME:  Jesus Russell, MRN:  [id number], DOB:  [birth year], LOS: 3 ADMISSION DATE:  02/14/2020, CONSULTATION DATE:  2/23  REFERRING MD:  Rebeca Alert, CHIEF COMPLAINT: acute respiratory failure in setting of massive epistaxis    Brief History   69 year old male with history of sarcoidosis, antiphospholipid antibody syndrome, prior PE and DVT, had been on Coumadin but had stopped this in order to take naproxen.  Admitted 2/21 with acute PE.  Developed worsening epistaxis and progressive respiratory failure requiring intubation on 2/23.   Past Medical History  Sarcoidosis, antiphospholipid ab syndrome. Recent PE.  Significant Hospital Events   2/21 admitted + PE 2/22 started to have epistaxis.  2/23 epistaxis continued. Left nare packed.  Had respiratory failure requiring intubation FOB negative for airway obstruction. ECHO w/ new RV failure suggesting progression of PE. Had Obstructive shock physiology. Heparin restarted. Supported on [employer]. + DVT BLEs.  2/24 pressor requirements improving. Not ready to wean. New AKI. But bleeding seems to have stopped.   Consults:  ENT Pulmonary critical care  Procedures:  Emergent intubation 2/23 Bronchoscopy 2/23 Left [place] cvl 2/23>>  Significant Diagnostic Tests:  2/21 CT angio chest: Positive for pulmonary emboli with combination of occlusive and nonocclusive embolus in the lower lobe lungs bilaterally from segmental to subsegmental vessel size 2/22: Echocardiogram: Demonstrated LVEF 60 to 40% grade 1 diastolic dysfunction RV severely enlarged tricuspid regurgitation RA was normal 2/23 [place] LEs>>>>- Findings consistent with acute deep vein thrombosis involving the right common femoral vein, SF junction, right femoral vein, right proximal profunda vein, right popliteal vein, right posterior tibial veins, and right gastrocnemius veins- Findings consistent with acute deep vein thrombosis involving the left posterior tibial veins. 2/23 echo: 1. Left ventricular  ejection fraction, by estimation, is 50 to 55%. The  left ventricle has low normal function. The left ventricle has no regional  wall motion abnormalities.  2. The images are very technically difficult . The RV could only be seen  using Definity contrast. The RV appears to be very dilated and with  severely reduced RV function There is moderate pulmonary artery hypertension . Right ventricular  systolic function is severely reduced. The right ventricular size is  severely enlarged. There is moderately elevated pulmonary artery systolic pressure.    Micro Data:   Antimicrobials:  unasyn 2/23>>>  Interim history/subjective:  Pressors weaned to off. Remains on low-dose propofol, fentanyl 175.  Wakes up agitated with stimulation Received Lasix last night for low urine output.  Note rise in serum creatinine, most recent bladder scan 600 cc urine Has not had any spontaneous breathing trials yet  Objective   Blood pressure (!) 102/91, pulse 94, temperature 98.4 F (36.9 C), temperature source Axillary, resp. rate 14, height _0  (1.727 m), weight 101.7 kg, SpO2 100 %. CVP:  [4 mmHg-17 mmHg] 14 mmHg  Vent Mode: PRVC FiO2 (%):  [40 %] 40 % Set Rate:  [14 bmp] 14 bmp Vt Set:  [540 mL] 540 mL PEEP:  [5 cmH20] 5 cmH20 Plateau Pressure:  [20 cmH20-28 cmH20] 20 cmH20   Intake/Output Summary (Last 24 hours) at 02/18/2020 1149 Last data filed at 02/18/2020 0900 Gross per 24 hour  Intake 2015.38 ml  Output 500 ml  Net 1515.38 ml   Filed Weights   02/14/20 2203 02/17/20 0322 02/18/20 0249  Weight: 102.4 kg 99.2 kg 101.7 kg    Examination: General 69 year old man, laying in bed, ventilated, ill-appearing HENT ET tube in good position, left nare packed, strong cough, minimal  secretions Pulm scattered rhonchi, no wheezing Card regular, distant, no murmur abd obese, nondistended, hypoactive bowel sounds Ext trace lower extremity edema Neuro sedated but arouses up and is strong, agitated with  any stimulation GU 600 cc urine noted on bladder scan just now   Resolved Hospital Problem list     Assessment & Plan:  Acute hypoxic respiratory failure, in setting of acute PE +/- aspiration event.  Airways were clean on bronch  Plan Tolerating PRVC, minimal secretions, strong cough. Apneic on PSV this morning, difficult to balance his sedation due to agitation.  Continue efforts at SBT Schedule Brovana, DuoNeb Anticoagulation as ordered for his pulmonary embolism  Acute pulmonary embolus w/ bilateral acute DVT, in the setting of subtherapeutic anticoagulation in a patient with known antiphospholipid antibody syndrome and prior pulmonary emboli Plan Plan to continue heparin infusion, likely defer changing to enteral anticoagulation until we are sure no further epistaxis after packing is removed  Epistaxis ->bleeding seems controlled. Hgb stable Plan Nasal packing.  Day 3 of 5 Day 3 Unasyn (will cont as long as packing remains at least)  Circulatory shock/obstructive shock 2/2 PE w/ acute right Ventricular Failure c/b underlying chronic PH -initially thought that this was 2/2 to drug related hypotension; repeat ECHO showed RV acutely dilated w/ severely reduced RV fxn on 2/23 Plan Norepinephrine weaned off.  CVP goal 8-12 Day 3 stress dose steroids.  Consider DC on 2/26 if blood pressure remains stable  AKI suspect ATN 2/2 decreased end-organ perfusion in shock state.  Plan Avoid further diuresis, ensure adequate perfusion pressure Renal dose medications Plan to place Foley given his progressive renal failure Follow BMP, urine output  History of sarcoidosis Plan Continue current BD (at home is on Breo epllipta)  Home bactrim held - unclear whether indicated on his pred regimen  Subacute T8 fracture -Has been on chronic steroids Plan Currently on fentanyl infusion  Best practice:  Diet: N.p.o. ->tubefeed  pain/Anxiety/Delirium protocol (if indicated): PAD protocol  initiated 2/23 VAP protocol (if indicated): Initiated 02/04/2022 DVT prophylaxis: heparin infusion GI prophylaxis: PPI Glucose control: Not indicated Mobility: Bedrest Code Status: Full code Family Communication: reviewed status and plans with pt's sister Vicente Males by phone on 2/25.   Disposition: ICU   Critical care time: 34 minutes.       Baltazar Apo, MD, PhD 02/18/2020, 11:51 AM [facility] [phone] or if no answer [phone]

## 2020-02-19 ENCOUNTER — Ambulatory Visit: Payer: Medicare Other | Admitting: Internal Medicine

## 2020-02-19 LAB — CBC
HCT: 28.8 % — ABNORMAL LOW (ref 39.0–52.0)
Hemoglobin: 8.8 g/dL — ABNORMAL LOW (ref 13.0–17.0)
MCH: 32.7 pg (ref 26.0–34.0)
MCHC: 30.6 g/dL (ref 30.0–36.0)
MCV: 107.1 fL — ABNORMAL HIGH (ref 80.0–100.0)
Platelets: 145 10*3/uL — ABNORMAL LOW (ref 150–400)
RBC: 2.69 MIL/uL — ABNORMAL LOW (ref 4.22–5.81)
RDW: 15.7 % — ABNORMAL HIGH (ref 11.5–15.5)
WBC: 16.8 10*3/uL — ABNORMAL HIGH (ref 4.0–10.5)
nRBC: 0.2 % (ref 0.0–0.2)

## 2020-02-19 LAB — PROTIME-INR
INR: 1.1 (ref 0.8–1.2)
Prothrombin Time: 14.3 seconds (ref 11.4–15.2)

## 2020-02-19 LAB — GLUCOSE, CAPILLARY
Glucose-Capillary: 142 mg/dL — ABNORMAL HIGH (ref 70–99)
Glucose-Capillary: 138 mg/dL — ABNORMAL HIGH (ref 70–99)
Glucose-Capillary: 119 mg/dL — ABNORMAL HIGH (ref 70–99)
Glucose-Capillary: 134 mg/dL — ABNORMAL HIGH (ref 70–99)
Glucose-Capillary: 97 mg/dL (ref 70–99)

## 2020-02-19 LAB — HEPARIN LEVEL (UNFRACTIONATED)
Heparin Unfractionated: 0.25 IU/mL — ABNORMAL LOW (ref 0.30–0.70)
Heparin Unfractionated: 0.32 IU/mL (ref 0.30–0.70)

## 2020-02-19 LAB — BASIC METABOLIC PANEL
Anion gap: 11 (ref 5–15)
BUN: 76 mg/dL — ABNORMAL HIGH (ref 8–23)
CO2: 30 mmol/L (ref 22–32)
Calcium: 8 mg/dL — ABNORMAL LOW (ref 8.9–10.3)
Chloride: 104 mmol/L (ref 98–111)
Creatinine, Ser: 2.67 mg/dL — ABNORMAL HIGH (ref 0.61–1.24)
GFR calc Af Amer: 28 mL/min — ABNORMAL LOW (ref >60)
GFR calc non Af Amer: 24 mL/min — ABNORMAL LOW (ref >60)
Glucose, Bld: 149 mg/dL — ABNORMAL HIGH (ref 70–99)
Potassium: 4.1 mmol/L (ref 3.5–5.1)
Sodium: 145 mmol/L (ref 135–145)

## 2020-02-19 LAB — TRIGLYCERIDES: Triglycerides: 110 mg/dL (ref <150)

## 2020-02-19 MED ORDER — ACETAMINOPHEN 325 MG PO TABS: 650.0000 mg | ORAL_TABLET | Freq: Four times a day (QID) | ORAL | Status: DC | PRN

## 2020-02-19 MED ORDER — SODIUM CHLORIDE 0.9 % IV SOLN: 3.0000 g | Freq: Four times a day (QID) | INTRAVENOUS | Status: DC

## 2020-02-19 MED ORDER — SODIUM CHLORIDE 0.9 % IV SOLN
3.0000 g | Freq: Four times a day (QID) | INTRAVENOUS | Status: DC
  Administered 2020-02-19 – 2020-02-21 (×8): 3 g via INTRAVENOUS
  Filled 2020-02-19 (×8): qty 8

## 2020-02-19 MED ORDER — POLYETHYLENE GLYCOL 3350 17 G PO PACK
17.0000 g | PACK | Freq: Every day | ORAL | Status: DC
  Administered 2020-02-19: 17 g
  Filled 2020-02-19: qty 1

## 2020-02-19 NOTE — Progress Notes (Signed)
NAME:  Jesus Russell, MRN:  [id number], DOB:  March 11, 1951, LOS: 4 ADMISSION DATE:  02/14/2020, CONSULTATION DATE:  2/23  REFERRING MD:  Dr. Rebeca Alert CHIEF COMPLAINT: acute respiratory failure in setting of massive epistaxis    Brief History   69 year old male with history of recent COVID (01/06/2020) sarcoidosis, antiphospholipid antibody syndrome, prior PE and DVT, had been on Coumadin but had stopped this in order to take naproxen.  Admitted 2/21 with acute PE.  Developed worsening epistaxis and progressive respiratory failure requiring intubation on 2/23.  Past Medical History  Sarcoidosis, antiphospholipid ab syndrome. Recent PE.  Significant Hospital Events   2/21 admitted + PE 2/22 started to have epistaxis.  2/23 Epistaxis continued. Left nare packed. Intubation w/ FOB negative for airway obstruction. ECHO w/ new RV failure suggesting progression of PE. Had obstructive shock physiology. Heparin restarted. Supported on [employer]. + DVT BLEs.  2/24 Pressor requirements improving. Not ready to wean. New AKI. But bleeding seems to have stopped.  2/26 Nasal packing in place, no further bleeding   Consults:  ENT Pulmonary critical care  Procedures:  ETT 2/23 >> Left [place] TLC 2/23 >>  Significant Diagnostic Tests:   2/21 CT angio chest: Positive for pulmonary emboli with combination of occlusive and nonocclusive embolus in the lower lobe lungs bilaterally from segmental to subsegmental vessel size  2/22 Echocardiogram: Demonstrated LVEF 60 to 78% grade 1 diastolic dysfunction RV severely enlarged tricuspid regurgitation RA was normal  2/23 [place] BLE >> Findings consistent with acute deep vein thrombosis involving the right common femoral vein, SF junction, right femoral vein, right proximal profunda vein, right popliteal vein, right posterior tibial veins, and right gastrocnemius veins- Findings consistent with acute deep vein thrombosis involving the left posterior tibial veins.  2/23 ECHO >>  Left ventricular ejection fraction, by estimation, is 50 to 55%. The  left ventricle has low normal function. The left ventricle has no regional  wall motion abnormalities.  The images are very technically difficult . The RV could only be seen using Definity contrast. The RV appears to be very dilated and with severely reduced RV function There is moderate pulmonary artery hypertension . Right ventricular systolic function is severely reduced. The right ventricular size is  severely enlarged. There is moderately elevated pulmonary artery systolic pressure.    Micro Data:  COVID 2/21 >> positive  Antimicrobials:  Unasyn 2/23 >>   Interim history/subjective:  RN reports pt awake, following commands. On PSV wean 8/5 Glucose range 126-150 I/O 1.6L UOP, +657m in 24h  Objective   Blood pressure (!) 150/86, pulse (!) 106, temperature (!) 97.3 F (36.3 C), temperature source Axillary, resp. rate 11, height 5' 8" (1.727 m), weight 101.7 kg, SpO2 100 %. CVP:  [12 mmHg-14 mmHg] 12 mmHg  Vent Mode: CPAP;PSV FiO2 (%):  [40 %] 40 % Set Rate:  [14 bmp] 14 bmp Vt Set:  [540 mL] 540 mL PEEP:  [5 cmH20] 5 cmH20 Pressure Support:  [8 cmH20] 8 cmH20 Plateau Pressure:  [15 cmH20-24 cmH20] 15 cmH20   Intake/Output Summary (Last 24 hours) at 02/19/2020 1042 Last data filed at 02/19/2020 1000 Gross per 24 hour  Intake 2024.49 ml  Output 1750 ml  Net 274.49 ml   Filed Weights   02/14/20 2203 02/17/20 0322 02/18/20 0249  Weight: 102.4 kg 99.2 kg 101.7 kg    Examination: General 69year old man, laying in bed, ventilated, ill-appearing HENT ET tube in good position, left nare packed, strong cough, minimal secretions Pulm scattered  rhonchi, no wheezing Card regular, distant, no murmur abd obese, nondistended, hypoactive bowel sounds Ext trace lower extremity edema Neuro sedated but arouses up and is strong, agitated with any stimulation GU 600 cc urine noted on bladder scan just now   Resolved  Hospital Problem list     Assessment & Plan:   Acute hypoxic respiratory failure, in setting of acute PE +/- aspiration event.  FOB post intubation with clean airways  -PRVC 8cc/kg as rest mode  -weaning on PSV 8/5 -continue brovana, duoneb -heparin gtt for PE  -stop date added for unasyn, if nasal packing not removed, will need to continue  Acute pulmonary embolus w/ bilateral acute DVT Antiphospholipid Antibody In the setting of subtherapeutic anticoagulation in a patient with known antiphospholipid antibody syndrome and prior pulmonary emboli -continue heparin per pharmacy  -hold transition to enteral until nasal packing removed and no further bleeding   Epistaxis Bleeding controlled, Hgb stable. Packing in place -appreciate ENT  -nasal packing day 4/5 -added stop date to unasyn for 2/28. If packing not removed, will need to continue   Circulatory Shock / Obstructive Shock 2/2 PE w/ Acute Right Ventricular Failure c/b underlying chronic PH -initially thought that this was 2/2 to drug related hypotension; repeat ECHO showed RV acutely dilated w/ severely reduced RV fxn on 2/23 -stop stress dose steroids  -change CVP to QShift  AKI  Suspect ATN 2/2 decreased end-organ perfusion in shock state -Trend BMP / urinary output -Replace electrolytes as indicated -Avoid nephrotoxic agents, ensure adequate renal perfusion  History of Sarcoidosis -continue current pulmonary regimen  -home bactrim on hold, suspect for PJP prophylaxis   Subacute T8 fracture Was on chronic steroids / taper, reportedly off on admit  -Fentanyl gtt for pain   Best practice:  Diet: NPO, TF pain/Anxiety/Delirium protocol (if indicated): PAD protocol  VAP protocol (if indicated): ordered DVT prophylaxis: heparin infusion GI prophylaxis: PPI Glucose control: Not indicated Mobility: Bedrest Code Status: Full code Family Communication: Patients sister Vicente Males) updated via phone 2/26   Disposition:  ICU   Critical care time: 59 minutes      Noe Gens, MSN, NP-C [facility] 02/19/2020, 10:42 AM   Please see [email or web address] for pager details.

## 2020-02-19 NOTE — Progress Notes (Addendum)
ANTICOAGULATION CONSULT NOTE  Pharmacy Consult for heparin Indication: pulmonary embolus  No Known Allergies  Patient Measurements: Height: 5' 8" (172.7 cm) Weight: 224 lb 3.3 oz (101.7 kg) IBW/kg (Calculated) : 68.4 Heparin Dosing Weight: 94 kg  Vital Signs: Temp: 98.5 F (36.9 C) (02/26 1100) Temp Source: Axillary (02/26 1100) BP: 113/82 (02/26 1445) Pulse Rate: 82 (02/26 1445)  Labs: Recent Labs    02/17/20 0459 02/17/20 0459 02/17/20 0836 02/17/20 1612 02/18/20 0132 02/18/20 0252 02/18/20 0252 02/18/20 1400 02/19/20 0544 02/19/20 1325  HGB 11.6*   < >   < >  --  10.4* 10.1*  --   --  8.8*  --   HCT 36.2*   < >   < >  --  33.3* 33.1*  --   --  28.8*  --   PLT 154   < >  --   --  143* 144*  --   --  145*  --   LABPROT 17.8*  --   --   --   --  15.9*  --   --  14.3  --   INR 1.5*  --   --   --   --  1.3*  --   --  1.1  --   HEPARINUNFRC 0.51   < >  --   --   --  0.72*   < > 0.53 0.25* 0.32  CREATININE 2.50*  --    < > 2.86*  --  3.12*  --   --  2.67*  --    < > = values in this interval not displayed.    Estimated Creatinine Clearance: 31.4 mL/min (A) (by C-G formula based on SCr of 2.67 mg/dL (H)).   Assessment: 69 yr old male presented with abdominal pain/SOB, hx COVID infection in January, now with bilateral PE on CT.  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  2/22 night and 2/23 morning the patient has been experiencing epistaxis and the heparin was continued until the patient experienced life threatening bleeding 2/23 afternoon and a code blue was called. The nose was packed and cauterized by ENT at this time the patient was emergently intubated and brought to the ICU.   Noted new DVT and RV strain on echo. Heparin restarted 02/16/20.  Heparin level is therapeutic and low normal.  Given acute PE and DVT, would prefer heparin level to be a little higher.  Hemoglobin and  platelet count trended down from admit, but no bleeding per RN.  Renal function improving.  Goal of Therapy:  Heparin level 0.3-0.5 units/ml Monitor platelets by anticoagulation protocol: Yes   Plan:  Increase heparin gtt slightly to 900 units/hr Daily heparin level and CBC  Ry Moody D. Mina Marble, PharmD, BCPS, [place] 02/19/2020, 3:04 PM

## 2020-02-19 NOTE — Progress Notes (Signed)
ANTICOAGULATION CONSULT NOTE  Pharmacy Consult for heparin Indication: pulmonary embolus  No Known Allergies  Patient Measurements: Height: _0  (172.7 cm) Weight: 224 lb 3.3 oz (101.7 kg) IBW/kg (Calculated) : 68.4 Heparin Dosing Weight: 94 kg  Vital Signs: Temp: 97.3 F (36.3 C) (02/26 0700) Temp Source: Axillary (02/26 0700) BP: 151/97 (02/26 0900) Pulse Rate: 115 (02/26 0900)  Labs: Recent Labs    02/17/20 0459 02/17/20 0459 02/17/20 0836 02/17/20 1612 02/18/20 0132 02/18/20 0252 02/18/20 1400 02/19/20 0544  HGB 11.6*   < >   < >  --  10.4* 10.1*  --  8.8*  HCT 36.2*   < >   < >  --  33.3* 33.1*  --  28.8*  PLT 154   < >  --   --  143* 144*  --  145*  LABPROT 17.8*  --   --   --   --  15.9*  --  14.3  INR 1.5*  --   --   --   --  1.3*  --  1.1  HEPARINUNFRC 0.51   < >  --   --   --  0.72* 0.53 0.25*  CREATININE 2.50*  --    < > 2.86*  --  3.12*  --  2.67*   < > = values in this interval not displayed.    Estimated Creatinine Clearance: 31.4 mL/min (A) (by C-G formula based on SCr of 2.67 mg/dL (H)).   Assessment: 69 yr old male presented with abdominal pain/SOB, hx COVID infection in January, now with bilateral PE on CT.  On warfarin PTA for hx VTE, INR 1.2 on admission.    2/22 night and 2/23 morning the patient has been experiencing epistaxis and the heparin was continued until the patient experienced life threatening bleeding 2/23 afternoon and a code blue was called. The nose was packed and cauterized by ENT at this time the patient was emergently intubated and brought to the ICU.   Per noted new DVT and RV strain on echo. Heparin restarted 2/23 ~1600. Hgb stable at 12.3. Plt 155.   Heparin level subtherapeutic (0.25) this AM on 800 units/hr. I will increase his heparin only slightly to 850 units/hr since he was supra-therapeutic at 0.72 on 900 units/hr. Per the RN there have been no issues with the infusion or s/sx bleeding.   Goal of Therapy:  Heparin  level 0.3-0.5 units/ml Monitor platelets by anticoagulation protocol: Yes   Plan:  -Continue heparin at 850 units/hr -Check HL at 1400 -Daily heparin level with CBC daily  Nicoletta Dress, PharmD PGY2 Infectious Disease Pharmacy Resident  02/19/2020 9:36 AM

## 2020-02-19 NOTE — Progress Notes (Signed)
PHARMACY NOTE:  ANTIMICROBIAL RENAL DOSAGE ADJUSTMENT  Current antimicrobial regimen includes a mismatch between antimicrobial dosage and estimated renal function.  As per policy approved by the Pharmacy & Therapeutics and Medical Executive Committees, the antimicrobial dosage will be adjusted accordingly.  Current antimicrobial dosage:  Unasyn 3g IV q12h   Indication: aspiration pneumonia, risk of TSS  Renal Function:  Estimated Creatinine Clearance: 31.4 mL/min (A) (by C-G formula based on SCr of 2.67 mg/dL (H)). _0      On intermittent HD, scheduled: _1      On CRRT    Antimicrobial dosage has been changed to:  Unasyn 3g IV q6h   Additional comments:    Thank you for allowing pharmacy to be a part of this patient's care.  Phillis Haggis, [facility] 02/19/2020 8:30 AM

## 2020-02-19 NOTE — Progress Notes (Signed)
[facility] Progress Note Patient Name: Deven Furia DOB: 08/10/1951 MRN: [id number]   Date of Service  02/19/2020  HPI/Events of Note  Pt needs order for BMET  eICU Interventions  BMET ordered.        Kerry Kass Ege Muckey 02/19/2020, 5:14 AM

## 2020-02-20 ENCOUNTER — Inpatient Hospital Stay (HOSPITAL_COMMUNITY): Payer: Medicare Other

## 2020-02-20 LAB — GLUCOSE, CAPILLARY
Glucose-Capillary: 106 mg/dL — ABNORMAL HIGH (ref 70–99)
Glucose-Capillary: 114 mg/dL — ABNORMAL HIGH (ref 70–99)
Glucose-Capillary: 118 mg/dL — ABNORMAL HIGH (ref 70–99)
Glucose-Capillary: 122 mg/dL — ABNORMAL HIGH (ref 70–99)
Glucose-Capillary: 84 mg/dL (ref 70–99)
Glucose-Capillary: 85 mg/dL (ref 70–99)
Glucose-Capillary: 87 mg/dL (ref 70–99)

## 2020-02-20 LAB — CBC
HCT: 26.9 % — ABNORMAL LOW (ref 39.0–52.0)
Hemoglobin: 8.1 g/dL — ABNORMAL LOW (ref 13.0–17.0)
MCH: 32.8 pg (ref 26.0–34.0)
MCHC: 30.1 g/dL (ref 30.0–36.0)
MCV: 108.9 fL — ABNORMAL HIGH (ref 80.0–100.0)
Platelets: 179 10*3/uL (ref 150–400)
RBC: 2.47 MIL/uL — ABNORMAL LOW (ref 4.22–5.81)
RDW: 16 % — ABNORMAL HIGH (ref 11.5–15.5)
WBC: 12.3 10*3/uL — ABNORMAL HIGH (ref 4.0–10.5)
nRBC: 0.5 % — ABNORMAL HIGH (ref 0.0–0.2)

## 2020-02-20 LAB — PROTIME-INR
INR: 1.1 (ref 0.8–1.2)
Prothrombin Time: 14.5 seconds (ref 11.4–15.2)

## 2020-02-20 LAB — BASIC METABOLIC PANEL
Anion gap: 9 (ref 5–15)
BUN: 80 mg/dL — ABNORMAL HIGH (ref 8–23)
CO2: 33 mmol/L — ABNORMAL HIGH (ref 22–32)
Calcium: 8.4 mg/dL — ABNORMAL LOW (ref 8.9–10.3)
Chloride: 106 mmol/L (ref 98–111)
Creatinine, Ser: 1.95 mg/dL — ABNORMAL HIGH (ref 0.61–1.24)
GFR calc Af Amer: 40 mL/min — ABNORMAL LOW (ref >60)
GFR calc non Af Amer: 35 mL/min — ABNORMAL LOW (ref >60)
Glucose, Bld: 111 mg/dL — ABNORMAL HIGH (ref 70–99)
Potassium: 3.8 mmol/L (ref 3.5–5.1)
Sodium: 148 mmol/L — ABNORMAL HIGH (ref 135–145)

## 2020-02-20 LAB — TRIGLYCERIDES: Triglycerides: 129 mg/dL (ref <150)

## 2020-02-20 LAB — HEPARIN LEVEL (UNFRACTIONATED): Heparin Unfractionated: 0.33 IU/mL (ref 0.30–0.70)

## 2020-02-20 MED ORDER — IPRATROPIUM-ALBUTEROL 0.5-2.5 (3) MG/3ML IN SOLN
3.0000 mL | Freq: Four times a day (QID) | RESPIRATORY_TRACT | Status: DC
  Administered 2020-02-20 – 2020-02-23 (×11): 3 mL via RESPIRATORY_TRACT
  Filled 2020-02-20 (×11): qty 3

## 2020-02-20 MED ORDER — METOLAZONE 5 MG PO TABS
5.0000 mg | ORAL_TABLET | Freq: Once | ORAL | Status: AC
  Administered 2020-02-20: 08:00:00 5 mg via ORAL
  Filled 2020-02-20: qty 1

## 2020-02-20 MED ORDER — FREE WATER
300.0000 mL | Freq: Four times a day (QID) | Status: DC
  Administered 2020-02-20: 08:00:00 300 mL

## 2020-02-20 MED ORDER — METHYLNALTREXONE BROMIDE 12 MG/0.6ML ~~LOC~~ SOLN
12.0000 mg | Freq: Once | SUBCUTANEOUS | Status: AC
  Administered 2020-02-20: 08:00:00 12 mg via SUBCUTANEOUS
  Filled 2020-02-20: qty 0.6

## 2020-02-20 MED ORDER — SALINE SPRAY 0.65 % NA SOLN
2.0000 | NASAL | Status: DC
  Administered 2020-02-20 – 2020-03-01 (×31): 2 via NASAL
  Filled 2020-02-20: qty 44

## 2020-02-20 MED ORDER — "THROMBI-PAD 3""X3"" EX PADS"
1.0000 | MEDICATED_PAD | Freq: Once | CUTANEOUS | Status: DC
  Filled 2020-02-20: qty 1

## 2020-02-20 NOTE — Progress Notes (Signed)
NAME:  Jesus Russell, MRN:  [id number], DOB:  March 12, 1951, LOS: 5 ADMISSION DATE:  02/14/2020, CONSULTATION DATE:  2/23  REFERRING MD:  Dr. Rebeca Alert CHIEF COMPLAINT: acute respiratory failure in setting of massive epistaxis    Brief History   69 year old male with history of recent COVID (01/06/2020) sarcoidosis, antiphospholipid antibody syndrome, prior PE and DVT, had been on Coumadin but had stopped this in order to take naproxen.  Admitted 2/21 with acute PE.  Developed worsening epistaxis and progressive respiratory failure requiring intubation on 2/23.  Past Medical History  Sarcoidosis, antiphospholipid ab syndrome. Recent PE.  Significant Hospital Events   2/21 admitted + PE 2/22 started to have epistaxis.  2/23 Epistaxis continued. Left nare packed. Intubation w/ FOB negative for airway obstruction. ECHO w/ new RV failure suggesting progression of PE. Had obstructive shock physiology. Heparin restarted. Supported on [employer]. + DVT BLEs.  2/24 Pressor requirements improving. Not ready to wean. New AKI. But bleeding seems to have stopped.  2/26 Nasal packing in place, no further bleeding   Consults:  ENT Pulmonary critical care  Procedures:  ETT 2/23 >> Left [place] TLC 2/23 >>  Significant Diagnostic Tests:   2/21 CT angio chest: Positive for pulmonary emboli with combination of occlusive and nonocclusive embolus in the lower lobe lungs bilaterally from segmental to subsegmental vessel size  2/22 Echocardiogram: Demonstrated LVEF 60 to 54% grade 1 diastolic dysfunction RV severely enlarged tricuspid regurgitation RA was normal  2/23 [place] BLE >> Findings consistent with acute deep vein thrombosis involving the right common femoral vein, SF junction, right femoral vein, right proximal profunda vein, right popliteal vein, right posterior tibial veins, and right gastrocnemius veins- Findings consistent with acute deep vein thrombosis involving the left posterior tibial veins.  2/23 ECHO >>  Left ventricular ejection fraction, by estimation, is 50 to 55%. The  left ventricle has low normal function. The left ventricle has no regional  wall motion abnormalities.  The images are very technically difficult . The RV could only be seen using Definity contrast. The RV appears to be very dilated and with severely reduced RV function There is moderate pulmonary artery hypertension . Right ventricular systolic function is severely reduced. The right ventricular size is  severely enlarged. There is moderately elevated pulmonary artery systolic pressure.    Micro Data:  COVID 2/21 >> positive  Antimicrobials:  Unasyn 2/23 >>   Interim history/subjective:  No events, Awake RN to get new packing from ER  Objective   Blood pressure 118/75, pulse 64, temperature 98 F (36.7 C), temperature source Axillary, resp. rate 14, height 5' 8" (1.727 m), weight 101.7 kg, SpO2 100 %. CVP:  [0 mmHg-8 mmHg] 0 mmHg  Vent Mode: PRVC FiO2 (%):  [30 %-40 %] 40 % Set Rate:  [14 bmp] 14 bmp Vt Set:  [540 mL-560 mL] 560 mL PEEP:  [5 cmH20] 5 cmH20 Pressure Support:  [5 cmH20-8 cmH20] 5 cmH20 Plateau Pressure:  [15 cmH20-20 cmH20] 20 cmH20   Intake/Output Summary (Last 24 hours) at 02/20/2020 0727 Last data filed at 02/20/2020 0600 Gross per 24 hour  Intake 1925.42 ml  Output 1325 ml  Net 600.42 ml   Filed Weights   02/14/20 2203 02/17/20 0322 02/18/20 0249  Weight: 102.4 kg 99.2 kg 101.7 kg    Examination: GEN: No acute disress HEENT: ETT in place, no secretions, L nares packing in place, no output CV: RRR, upper ext warm, LE cool PULM: clear, no wheezing or accessory muscle use  GI: soft, +BS EXT: bilateral pitting edema NEURO: moves all 4 ext to command PSYCH: RASS 0 SKIN: livido noted in lower ext  WBC improved Hgb stable Plts stable Sodium up Cr improved BUN up  Resolved Hospital Problem list     Assessment & Plan:   Acute hypoxic respiratory failure, in setting of acute PE  +/- aspiration event.  FOB post intubation with clean airways  -PRVC 8cc/kg as rest mode  -weaning on PSV 8/5 -continue brovana, duoneb -heparin gtt for PE  -stop date added for unasyn, if nasal packing not removed, will need to continue -likely extubate today  Acute pulmonary embolus w/ bilateral acute DVT Antiphospholipid Antibody In the setting of subtherapeutic anticoagulation in a patient with known antiphospholipid antibody syndrome and prior pulmonary emboli -continue heparin per pharmacy  -hold transition to enteral until nasal packing removed and no further bleeding   Epistaxis Bleeding controlled, Hgb stable. Packing in place -unpack today, repack PRN -added stop date to unasyn for 2/28. If packing not removed, will need to continue   Circulatory Shock / Obstructive Shock 2/2 PE w/ Acute Right Ventricular Failure c/b underlying chronic PH- improved -initially thought that this was 2/2 to drug related hypotension; repeat ECHO showed RV acutely dilated w/ severely reduced RV fxn on 2/23 -stop stress dose steroids   AKI  Suspect ATN 2/2 decreased end-organ perfusion in shock state -Trend BMP / urinary output -Replace electrolytes as indicated -Avoid nephrotoxic agents, ensure adequate renal perfusion  History of Sarcoidosis -continue current pulmonary regimen  -PJP ppx bactrim on hold  Subacute T8 fracture Was on chronic steroids / taper, reportedly off on admit  -Fentanyl gtt for pain   Best practice:  Diet: NPO, TF pain/Anxiety/Delirium protocol (if indicated): PAD protocol  VAP protocol (if indicated): ordered DVT prophylaxis: heparin infusion GI prophylaxis: PPI Glucose control: Not indicated Mobility: Bedrest Code Status: Full code Family Communication: will update patient hopefully once off vent Disposition: ICU   The patient is critically ill with multiple organ systems failure and requires high complexity decision making for assessment and support,  frequent evaluation and titration of therapies, application of advanced monitoring technologies and extensive interpretation of multiple databases. Critical Care Time devoted to patient care services described in this note independent of APP/resident time (if applicable)  is 32 minutes.   Erskine Emery MD [facility] 02/20/2020 7:34 AM Personal pager: [phone] If unanswered, please page CCM On-call: [phone]

## 2020-02-20 NOTE — Evaluation (Signed)
Clinical/Bedside Swallow Evaluation Patient Details  Name: Jesus Russell MRN: [id number] Date of Birth: January 02, 1951  Today's Date: 02/20/2020 Time: SLP Start Time (ACUTE ONLY): 1138 SLP Stop Time (ACUTE ONLY): 1153 SLP Time Calculation (min) (ACUTE ONLY): 15 min  Past Medical History:  Past Medical History:  Diagnosis Date  . DVT of lower extremity, bilateral ([place])   . Hypertension   . Incarcerated umbilical hernia 08/7352   Status post repair  . Lupus anticoagulant positive    Per records from [facility]  . 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 Pulmonary artery hypertension ([place]) 04/2011   Mildly elevated pulmonary artery pressure in setting of PE (from [facility])  . Pulmonary embolism ([place]) 04/2011  . Sarcoidosis 1989  . Subdural hematoma ([facility])    Remote episode, occurred when taking excedrin and warfarin.    Past Surgical History:  Past Surgical History:  Procedure Laterality Date  . BRAIN SURGERY    . HERNIA REPAIR     HPI:  Jesus Russell is a 69 year old male with a past medical history significant for positive lupus anticoagulant, PE (2012), DVT, IVC filter, COVID-19 infection on 1/13, HTN, hypertension, and sarcoidosis who presented to the ED with left lower chest/left upper quadrant abdominal pain.   Pt was intubated from 2/23-2/27. CXR on 2/27 reported "Bilateral airspace densities unchanged and appear chronic. No acute infiltrate or effusion."     Assessment / Plan / Recommendation Clinical Impression  Pt was seen for a bedside swallow evaluation in the setting of his recent extubation at 9:05 this morning.  Pt was encountered awake/alert sitting upright in bed and he was very agreeable to ST evaluation.  Pt was observed to have minimally hoarse vocal quality, but vocal intensity was [facility].  Oral mechanism exam was remarkable for dried, bloody secretions on the pt's anterior lingual surface and on his soft  palate.  SLP provided oral care prior to PO trials which helped to remove some of the dried secretions.  Pt consumed trials of ice chips, thin liquid, puree, and regular solids.  Mastication and bolus formation were mildly prolonged with regular solid trials, but no oral residue was observed following swallow initiation.  Additionally no clinical s/sx of aspiration were observed with any trials including thin liquid when pt was challenged with consuming 3oz of water continuously.  Vocal quality remained clear and vitals were stable throughout all PO trials.  Recommend conservative initiation of Dysphagia 1 (puree) solids and thin liquids with medications administered whole in puree (cut/crush large pills).  Pt will benefit from full supervision to assist with feeding and to cue for the following compensatory strategies: 1) Small bites/sips 2) Slow rate of intake 3) Sit upright as possible.  Anticipate that pt will be able to upgrade quickly to more complex solids.  SLP will f/u to monitor diet tolerance and to make adjustments as appropriate.    SLP Visit Diagnosis: Dysphagia, unspecified (R13.10)    Aspiration Risk  Mild aspiration risk    Diet Recommendation Dysphagia 1 (Puree);Thin liquid   Liquid Administration via: Cup;Straw Medication Administration: Whole meds with puree Supervision: Staff to assist with self feeding Compensations: Minimize environmental distractions;Slow rate;Small sips/bites Postural Changes: Seated upright at 90 degrees    Other  Recommendations Oral Care Recommendations: Oral care BID   Follow up Recommendations Other (comment)(TBD)      Frequency and Duration min 2x/week  2 weeks  Prognosis Prognosis for Safe Diet Advancement: Good      Swallow Study   General Date of Onset: 02/20/20 HPI: Jesus Russell is a 69 year old male with a past medical history significant for positive lupus anticoagulant, PE (2012), DVT, IVC filter, COVID-19 infection on 1/13, HTN,  hypertension, and sarcoidosis who presented to the ED with left lower chest/left upper quadrant abdominal pain.   Pt was intubated from 2/23-2/27. CXR on 2/27 reported "Bilateral airspace densities unchanged and appear chronic. No acute infiltrate or effusion."   Type of Study: Bedside Swallow Evaluation Previous Swallow Assessment: None Diet Prior to this Study: NPO Temperature Spikes Noted: No Respiratory Status: Room air History of Recent Intubation: Yes Length of Intubations (days): 4 days Date extubated: 02/20/20 Behavior/Cognition: Alert;Cooperative;Pleasant mood Oral Cavity Assessment: Dry;Dried secretions Oral Care Completed by SLP: Yes Oral Cavity - Dentition: Poor condition;Adequate natural dentition Self-Feeding Abilities: Needs assist Patient Positioning: Upright in bed Baseline Vocal Quality: Hoarse Volitional Swallow: Able to elicit    Oral/Motor/Sensory Function Overall Oral Motor/Sensory Function: Within functional limits   Ice Chips Ice chips: Within functional limits Presentation: Spoon   Thin Liquid Thin Liquid: Within functional limits Presentation: Spoon;Straw    Nectar Thick Nectar Thick Liquid: Not tested   Honey Thick Honey Thick Liquid: Not tested   Puree Puree: Within functional limits Presentation: Spoon   Solid     Solid: Impaired Presentation: Spoon Oral Phase Impairments: Impaired mastication Oral Phase Functional Implications: Prolonged oral transit     Colin Mulders M.S., CCC-SLP Acute Rehabilitation Services Office: [phone]  [place] 02/20/2020,2:22 PM

## 2020-02-20 NOTE — Progress Notes (Signed)
Subjective:    Patient ID: Jesus Russell, male    DOB: [birth year], 69 y.o.   MRN: [id number]  HPI Nasal bleeding had remained controlled with packing in place.  Remains intubated and anti-coagulated.  Review of Systems     Objective:   Physical Exam AF VSS Sedated but responding, intubated Nasal pack in place in left nose, no active bleeding    Assessment & Plan:  Left epistaxis, anti-coagulation  The left-sided nasal pack was removed with no bleeding.  He should avoid blowing.  Use saline spray frequently in both sides of the nose.

## 2020-02-20 NOTE — Procedures (Signed)
Extubation Procedure Note  Patient Details:   Name: Jesus Russell DOB: 10-Sep-1951 MRN: [id number]   Airway Documentation:    Vent end date: 02/20/20 Vent end time: 0904   Evaluation  O2 sats: stable throughout Complications: No apparent complications Patient did tolerate procedure well. Bilateral Breath Sounds: Rhonchi   Yes   Patient extubated per MD order. Positive cuff leak. Able to vocalize and clear secretions. RT to monitor as needed  Saunders Glance 02/20/2020, 9:05 AM

## 2020-02-21 ENCOUNTER — Inpatient Hospital Stay (HOSPITAL_COMMUNITY): Payer: Medicare Other

## 2020-02-21 DIAGNOSIS — R040 Epistaxis: Secondary | ICD-10-CM

## 2020-02-21 LAB — CBC
HCT: 30.4 % — ABNORMAL LOW (ref 39.0–52.0)
Hemoglobin: 9.1 g/dL — ABNORMAL LOW (ref 13.0–17.0)
MCH: 32.7 pg (ref 26.0–34.0)
MCHC: 29.9 g/dL — ABNORMAL LOW (ref 30.0–36.0)
MCV: 109.4 fL — ABNORMAL HIGH (ref 80.0–100.0)
Platelets: 202 10*3/uL (ref 150–400)
RBC: 2.78 MIL/uL — ABNORMAL LOW (ref 4.22–5.81)
RDW: 16.1 % — ABNORMAL HIGH (ref 11.5–15.5)
WBC: 17.6 10*3/uL — ABNORMAL HIGH (ref 4.0–10.5)
nRBC: 0.2 % (ref 0.0–0.2)

## 2020-02-21 LAB — BASIC METABOLIC PANEL
Anion gap: 13 (ref 5–15)
BUN: 61 mg/dL — ABNORMAL HIGH (ref 8–23)
CO2: 32 mmol/L (ref 22–32)
Calcium: 8.9 mg/dL (ref 8.9–10.3)
Chloride: 100 mmol/L (ref 98–111)
Creatinine, Ser: 1.45 mg/dL — ABNORMAL HIGH (ref 0.61–1.24)
GFR calc Af Amer: 58 mL/min — ABNORMAL LOW (ref >60)
GFR calc non Af Amer: 50 mL/min — ABNORMAL LOW (ref >60)
Glucose, Bld: 100 mg/dL — ABNORMAL HIGH (ref 70–99)
Potassium: 4.5 mmol/L (ref 3.5–5.1)
Sodium: 145 mmol/L (ref 135–145)

## 2020-02-21 LAB — POCT I-STAT 7, (LYTES, BLD GAS, ICA,H+H)
Acid-Base Excess: 15 mmol/L — ABNORMAL HIGH (ref 0.0–2.0)
Bicarbonate: 41.6 mmol/L — ABNORMAL HIGH (ref 20.0–28.0)
Calcium, Ion: 1.22 mmol/L (ref 1.15–1.40)
HCT: 28 % — ABNORMAL LOW (ref 39.0–52.0)
Hemoglobin: 9.5 g/dL — ABNORMAL LOW (ref 13.0–17.0)
O2 Saturation: 100 %
Patient temperature: 98.6
Potassium: 3.6 mmol/L (ref 3.5–5.1)
Sodium: 144 mmol/L (ref 135–145)
TCO2: 44 mmol/L — ABNORMAL HIGH (ref 22–32)
pCO2 arterial: 64.3 mmHg — ABNORMAL HIGH (ref 32.0–48.0)
pH, Arterial: 7.42 (ref 7.350–7.450)
pO2, Arterial: 496 mmHg — ABNORMAL HIGH (ref 83.0–108.0)

## 2020-02-21 LAB — PROTIME-INR
INR: 1.1 (ref 0.8–1.2)
Prothrombin Time: 14.1 seconds (ref 11.4–15.2)

## 2020-02-21 LAB — TRIGLYCERIDES
Triglycerides: 100 mg/dL (ref <150)
Triglycerides: 92 mg/dL (ref <150)

## 2020-02-21 LAB — HEPARIN LEVEL (UNFRACTIONATED)
Heparin Unfractionated: 0.3 IU/mL (ref 0.30–0.70)
Heparin Unfractionated: 0.27 IU/mL — ABNORMAL LOW (ref 0.30–0.70)

## 2020-02-21 LAB — GLUCOSE, CAPILLARY
Glucose-Capillary: 109 mg/dL — ABNORMAL HIGH (ref 70–99)
Glucose-Capillary: 82 mg/dL (ref 70–99)
Glucose-Capillary: 89 mg/dL (ref 70–99)
Glucose-Capillary: 94 mg/dL (ref 70–99)
Glucose-Capillary: 98 mg/dL (ref 70–99)
Glucose-Capillary: 99 mg/dL (ref 70–99)

## 2020-02-21 MED ORDER — FENTANYL BOLUS VIA INFUSION
50.0000 ug | INTRAVENOUS | Status: DC
  Filled 2020-02-21 (×7): qty 50

## 2020-02-21 MED ORDER — MIDAZOLAM HCL 2 MG/2ML IJ SOLN: 2.0000 mg | Freq: Once | INTRAMUSCULAR | Status: AC

## 2020-02-21 MED ORDER — FENTANYL CITRATE (PF) 100 MCG/2ML IJ SOLN: 100.0000 ug | Freq: Once | INTRAMUSCULAR | Status: AC

## 2020-02-21 MED ORDER — MIDAZOLAM HCL 2 MG/2ML IJ SOLN
INTRAMUSCULAR | Status: AC
  Administered 2020-02-21: 18:00:00 2 mg via INTRAVENOUS
  Filled 2020-02-21: qty 2

## 2020-02-21 MED ORDER — ACETAMINOPHEN 325 MG PO TABS
650.0000 mg | ORAL_TABLET | Freq: Four times a day (QID) | ORAL | Status: DC | PRN
  Filled 2020-02-21: qty 2

## 2020-02-21 MED ORDER — ETOMIDATE 2 MG/ML IV SOLN
20.0000 mg | Freq: Once | INTRAVENOUS | Status: AC
  Administered 2020-02-21: 18:00:00 20 mg via INTRAVENOUS

## 2020-02-21 MED ORDER — PROPOFOL 1000 MG/100ML IV EMUL
5.0000 ug/kg/min | INTRAVENOUS | Status: DC
  Administered 2020-02-21: 15 ug/kg/min via INTRAVENOUS
  Administered 2020-02-21 – 2020-02-22 (×2): 5 ug/kg/min via INTRAVENOUS
  Filled 2020-02-21 (×4): qty 100

## 2020-02-21 MED ORDER — PANTOPRAZOLE SODIUM 40 MG PO TBEC
40.0000 mg | DELAYED_RELEASE_TABLET | Freq: Every day | ORAL | Status: DC
  Administered 2020-02-21 – 2020-03-01 (×8): 40 mg via ORAL
  Filled 2020-02-21 (×9): qty 1

## 2020-02-21 MED ORDER — FENTANYL BOLUS VIA INFUSION
50.0000 ug | INTRAVENOUS | Status: DC | PRN
  Administered 2020-02-22 – 2020-02-25 (×8): 50 ug via INTRAVENOUS
  Filled 2020-02-21: qty 50

## 2020-02-21 MED ORDER — VITAMIN D 25 MCG (1000 UNIT) PO TABS
1000.0000 [IU] | ORAL_TABLET | Freq: Every day | ORAL | Status: DC
  Administered 2020-02-21 – 2020-03-01 (×9): 1000 [IU] via ORAL
  Filled 2020-02-21 (×10): qty 1

## 2020-02-21 MED ORDER — PREDNISONE 10 MG PO TABS: 10.0000 mg | ORAL_TABLET | Freq: Every day | ORAL | Status: DC

## 2020-02-21 MED ORDER — ROCURONIUM BROMIDE 50 MG/5ML IV SOLN
50.0000 mg | Freq: Once | INTRAVENOUS | Status: AC
  Administered 2020-02-21: 18:00:00 50 mg via INTRAVENOUS

## 2020-02-21 MED ORDER — POLYETHYLENE GLYCOL 3350 17 G PO PACK: 17.0000 g | PACK | Freq: Every day | ORAL | Status: DC | PRN

## 2020-02-21 MED ORDER — FENTANYL 2500MCG IN NS 250ML (10MCG/ML) PREMIX INFUSION
0.0000 ug/h | INTRAVENOUS | Status: DC
  Administered 2020-02-21: 18:00:00 25 ug/h via INTRAVENOUS
  Administered 2020-02-22: 200 ug/h via INTRAVENOUS
  Administered 2020-02-24: 75 ug/h via INTRAVENOUS
  Administered 2020-02-24: 150 ug/h via INTRAVENOUS
  Filled 2020-02-21 (×5): qty 250

## 2020-02-21 MED ORDER — CHLORHEXIDINE GLUCONATE 0.12 % MT SOLN
OROMUCOSAL | Status: AC
  Administered 2020-02-21: 08:00:00 15 mL via OROMUCOSAL
  Filled 2020-02-21: qty 15

## 2020-02-21 MED ORDER — FENTANYL CITRATE (PF) 100 MCG/2ML IJ SOLN
INTRAMUSCULAR | Status: AC
  Administered 2020-02-21: 18:00:00 100 ug via INTRAVENOUS
  Filled 2020-02-21: qty 2

## 2020-02-21 NOTE — Progress Notes (Signed)
Rehab Admissions Coordinator Note:  Per PT and OT recommendation, this patient was screened by Raechel Ache for appropriateness for an Inpatient Acute Rehab Consult.  At this time, we are recommending an Inpatient Rehab consult. AC will contact MD to request order.   Raechel Ache 02/21/2020, 11:29 AM  I can be reached at [phone].

## 2020-02-21 NOTE — Procedures (Signed)
Nasal packing with Rhino Rocket procedure note: Jesus Russell  02/21/2020   Procedure: Nasal packing Rhino Rocket Indications: Nosebleeding  Procedure details: Consent: Urgent Timeout: Performed prior to endotracheal intubation.  Rapid Rhino Rocket was soaked in oxymetazoline and lubricated with sterile surgical lube.  The patient's left nostril was cleared of blood clots and secretions using suction catheters prior to insertion.  Only in the anterior location of the nose.  The lubricated Rapid Rhino Rocket was inserted and the left nostril.  There was inserted into the pressurized and the device was snug fit in the nose.  Tolerated the procedure well.  [facility] 02/21/2020 6:19 PM

## 2020-02-21 NOTE — Progress Notes (Signed)
NAME:  Jesus Russell, MRN:  [id number], DOB:  [birth year], LOS: 6 ADMISSION DATE:  02/14/2020, CONSULTATION DATE:  2/23  REFERRING MD:  Dr. Rebeca Alert CHIEF COMPLAINT: acute respiratory failure in setting of massive epistaxis    Brief History   69 year old male with history of recent COVID (01/06/2020) sarcoidosis, antiphospholipid antibody syndrome, prior PE and DVT, had been on Coumadin but had stopped this in order to take naproxen.  Admitted 2/21 with acute PE.  Developed worsening epistaxis and progressive respiratory failure requiring intubation on 2/23.  Past Medical History  Sarcoidosis, antiphospholipid ab syndrome. Recent PE.  Significant Hospital Events   2/21 admitted + PE 2/22 started to have epistaxis.  2/23 Epistaxis continued. Left nare packed. Intubation w/ FOB negative for airway obstruction. ECHO w/ new RV failure suggesting progression of PE. Had obstructive shock physiology. Heparin restarted. Supported on [employer]. + DVT BLEs.  2/24 Pressor requirements improving. Not ready to wean. New AKI. But bleeding seems to have stopped.  2/26 Nasal packing in place, no further bleeding   Consults:  ENT Pulmonary critical care  Procedures:  ETT 2/23 >> Left [place] TLC 2/23 >>  Significant Diagnostic Tests:   2/21 CT angio chest: Positive for pulmonary emboli with combination of occlusive and nonocclusive embolus in the lower lobe lungs bilaterally from segmental to subsegmental vessel size  2/22 Echocardiogram: Demonstrated LVEF 60 to 78% grade 1 diastolic dysfunction RV severely enlarged tricuspid regurgitation RA was normal  2/23 [place] BLE >> Findings consistent with acute deep vein thrombosis involving the right common femoral vein, SF junction, right femoral vein, right proximal profunda vein, right popliteal vein, right posterior tibial veins, and right gastrocnemius veins- Findings consistent with acute deep vein thrombosis involving the left posterior tibial veins.  2/23 ECHO >>  Left ventricular ejection fraction, by estimation, is 50 to 55%. The  left ventricle has low normal function. The left ventricle has no regional  wall motion abnormalities.  The images are very technically difficult . The RV could only be seen using Definity contrast. The RV appears to be very dilated and with severely reduced RV function There is moderate pulmonary artery hypertension . Right ventricular systolic function is severely reduced. The right ventricular size is  severely enlarged. There is moderately elevated pulmonary artery systolic pressure.    Micro Data:  COVID 2/21 >> positive  Antimicrobials:  Unasyn 2/23 >>   Interim history/subjective:  He tolerated extubation but does not walk well this morning.  When asked what is bothering him he cannot really say just says that he feels fatigued and ill.  Nose bleeding a small amount this AM but stops with pressure.  Objective   Blood pressure (!) 147/96, pulse 87, temperature 97.9 F (36.6 C), temperature source Oral, resp. rate (!) 23, height _0  (1.727 m), weight 103.4 kg, SpO2 96 %.        Intake/Output Summary (Last 24 hours) at 02/21/2020 0857 Last data filed at 02/21/2020 0800 Gross per 24 hour  Intake 836.33 ml  Output 2125 ml  Net -1288.67 ml   Filed Weights   02/17/20 0322 02/18/20 0249 02/21/20 0428  Weight: 99.2 kg 101.7 kg 103.4 kg    Examination: GEN: No acute disress HEENT: Small amount of bleeding from left nares CV: RRR, upper ext warm, LE cool PULM: wheezing and crackles bilaterally, mildly tachypneic GI: soft, +BS EXT: bilateral pitting edema NEURO: moves all 4 ext to command PSYCH: RASS 0 SKIN: livido noted in lower ext, markedly  swollen, L cooler than R ext, not painful, not numb, able to move toes, pulses dopplerable  WBC improved Hgb stable Plts stable Sodium up Cr improved BUN up  Resolved Hospital Problem list   Circulatory Shock / Obstructive Shock 2/2 PE w/ Acute Right Ventricular  Failure c/b underlying chronic PH- resolved  Constipation: miralax PRN  Assessment & Plan:   Acute hypoxic respiratory failure, in setting of acute PE +/- aspiration event.  - ETT with big blood clot on it when it came out yesterday - Encourage IS, OOB  Acute pulmonary embolus w/ bilateral acute DVT, livedo reticularis Antiphospholipid Antibody - Heparin gtt, nose still bleeding intermittently, hold on transition for now - Neurovascular checks on LE  Epistaxis - Hold pressure, use afrin liberally - Low threshold to repack given poor pulmonary reserve   AKI  Suspect ATN 2/2 decreased end-organ perfusion in shock state -Trend BMP / urinary output -Replace electrolytes as indicated -Avoid nephrotoxic agents, ensure adequate renal perfusion - f/u AM chemistries  History of Sarcoidosis -continue current pulmonary regimen  -PJP ppx bactrim on hold -tapered off stress steroids  Subacute T8 fracture Was on chronic steroids / taper, reportedly off on admit  Denies pain at present  Metabolic encephalopathy vs. Cognitive deficit- unclear baseline, he does mentate but slow to respond and has poor insight, family may be helpful here, will have nurse reach out; comes from a facility of some kind  Dispo- his legs look a little worse, his respiratory status remains tenuous and has ongoing nose bleeds, going to keep him another day in ICU to make sure continues to be stable  Best practice:  Diet: Dysphagia pain/Anxiety/Delirium protocol (if indicated): N/Al  VAP protocol (if indicated): ordered DVT prophylaxis: heparin infusion GI prophylaxis: PTA PPI Glucose control: Not indicated Mobility: PT/OT Code Status: Full code Family Communication: patient updated Disposition: ICU  The patient is critically ill with multiple organ systems failure and requires high complexity decision making for assessment and support, frequent evaluation and titration of therapies, application of advanced  monitoring technologies and extensive interpretation of multiple databases. Critical Care Time devoted to patient care services described in this note independent of APP/resident time (if applicable)  is 35 minutes.   Erskine Emery MD [facility] 02/21/2020 8:57 AM Personal pager: [phone] If unanswered, please page CCM On-call: [phone]

## 2020-02-21 NOTE — Progress Notes (Signed)
Progressive resp distress through shift, nose bleeding more uncontrollably, had to be put back on vent. Will have to re-engage ENT in AM.  Erskine Emery MD PCCM

## 2020-02-21 NOTE — Procedures (Signed)
Intubation Procedure Note Jesus Russell [id number] 01/10/1951  Procedure: Intubation Indications: Airway protection and maintenance  Procedure Details Consent: Risks of procedure as well as the alternatives and risks of each were explained to the (patient/caregiver).  Consent for procedure obtained. Time Out: Verified patient identification, verified procedure, site/side was marked, verified correct patient position, special equipment/implants available, medications/allergies/relevent history reviewed, required imaging and test results available.  Performed  Maximum sterile technique was used including antiseptics, cap, gloves, gown, hand hygiene and mask.  MAC videolarygoscope  Sedation with etomidate 1m and 586m rocuronium  Grade 1 VC view Lots of blood clots in posterpharynx suctioned clear   Evaluation Hemodynamic Status: BP stable throughout; O2 sats: stable throughout Patient's Current Condition: stable Complications: No apparent complications Patient did tolerate procedure well. Chest X-ray ordered to verify placement.  CXR: pending.   BrOctavio Gravescard 02/21/2020

## 2020-02-21 NOTE — Progress Notes (Signed)
Pt refuses CPT.  Pt has active nose bleed states it makes his nose bleed worse

## 2020-02-21 NOTE — Evaluation (Signed)
Physical Therapy Evaluation Patient Details Name: Jesus Russell MRN: [id number] DOB: 1951/11/09 Today's Date: 02/21/2020   History of Present Illness  (P) 69 yo admitted with abdominal pain with acute PE 2/21, 2/22 epistaxis, pt intubated 2/23-2/27. PMhx: Pt seen by P.T. 01/25/20 due to T6 compression fx, Lupus, covid 1/13, HTN, sarcoidosis  Clinical Impression  Pt seen in conjunction with OT with pt tolerating sitting EOB, standing and pivot to chair. Pt with decreased strength, balance, posture, cognition, and function who will benefit from acute therapy to maximize mobility, strength and safety to decrease burden of care. Pt normally independent and living at home with sister with edema of extremities, prolonged bedrest with vent and will benefit from CIR to reach maximum potential for return home.  vSS with pt reporting initial dizziness sitting EOB but no hypotension SpO2 100% on 3L    Follow Up Recommendations CIR;Supervision/Assistance - 24 hour    Equipment Recommendations  Rolling walker with 5" wheels;Wheelchair (measurements PT);3in1 (PT)    Recommendations for Other Services Rehab consult     Precautions / Restrictions Precautions Precautions: (P) Fall Precaution Comments: (P) no blowing nose Restrictions Weight Bearing Restrictions: (P) No      Mobility  Bed Mobility Overal bed mobility: Needs Assistance Bed Mobility: Rolling;Sidelying to Sit Rolling: Max assist Sidelying to sit: Max assist;+2 for physical assistance       General bed mobility comments: cues, Hand over hand assist and physical assist to roll bil. Side to sit with max +2 and physical assist to bring legs off of bed and elevate trunk  Transfers Overall transfer level: Needs assistance   Transfers: Sit to/from Stand;Stand Pivot Transfers Sit to Stand: Mod assist;+2 physical assistance Stand pivot transfers: Max assist;+2 physical assistance       General transfer comment: mod +2 from elevated  bed with cues for sequence and assist of sacrum with pad and belt to rise x 2 trials. Pt noted to have nose bleed with transition to standing and RN cleared mobility for OOB to chair prior to pivot. Max +2 with sequential cues to pivot toward left to chair  Ambulation/Gait             General Gait Details: unable  Stairs            Wheelchair Mobility    Modified Rankin (Stroke Patients Only)       Balance Overall balance assessment: Needs assistance   Sitting balance-Leahy Scale: Fair Sitting balance - Comments: initial min assist with anterior right lean with maintained neck flexion, pt able to then progress to minguard EOB     Standing balance-Leahy Scale: Zero Standing balance comment: +2 physical assist with sacrum supported and knees blocked                             Pertinent Vitals/Pain Pain Assessment: No/denies pain    Home Living Family/patient expects to be discharged to:: (P) Private residence Living Arrangements: Other relatives Available Help at Discharge: Family;Available 24 hours/day Type of Home: House Home Access: Stairs to enter Entrance Stairs-Rails: Right Entrance Stairs-Number of Steps: 3 Home Layout: Two level Home Equipment: None      Prior Function Level of Independence: Independent               Hand Dominance        Extremity/Trunk Assessment   Upper Extremity Assessment Upper Extremity Assessment: Defer to OT evaluation    Lower  Extremity Assessment Lower Extremity Assessment: Generalized weakness    Cervical / Trunk Assessment Cervical / Trunk Assessment: Kyphotic  Communication   Communication: No difficulties  Cognition Arousal/Alertness: Awake/alert Behavior During Therapy: Flat affect Overall Cognitive Status: Impaired/Different from baseline Area of Impairment: Orientation;Safety/judgement;Problem solving;Following commands                 Orientation Level: Disoriented to;Time      Following Commands: Follows one step commands consistently Safety/Judgement: Decreased awareness of safety;Decreased awareness of deficits   Problem Solving: Slow processing;Requires verbal cues;Requires tactile cues General Comments: pt maintaining right lower gaze unless cued with right lean and neck flexion with pt unaware. Pt stating even in current state he can ascend steps to bedroom despite education for deficits      General Comments      Exercises General Exercises - Lower Extremity Long Arc Quad: AAROM;Both;Seated;10 reps   Assessment/Plan    PT Assessment Patient needs continued PT services  PT Problem List Decreased activity tolerance;Decreased mobility;Cardiopulmonary status limiting activity;Decreased knowledge of use of DME;Decreased strength;Decreased range of motion;Decreased coordination;Decreased safety awareness;Decreased cognition;Decreased balance       PT Treatment Interventions Therapeutic activities;Balance training;Therapeutic exercise;Functional mobility training;Gait training;Patient/family education;DME instruction;Neuromuscular re-education;Cognitive remediation    PT Goals (Current goals can be found in the Care Plan section)  Acute Rehab PT Goals Patient Stated Goal: return home PT Goal Formulation: With patient Time For Goal Achievement: 03/06/20 Potential to Achieve Goals: Fair    Frequency Min 3X/week   Barriers to discharge Decreased caregiver support pt lives with sister and reports she can provide 24hr assist    Co-evaluation PT/OT/SLP Co-Evaluation/Treatment: Yes Reason for Co-Treatment: (P) For patient/therapist safety;Complexity of the patient's impairments (multi-system involvement) PT goals addressed during session: Mobility/safety with mobility;Balance OT goals addressed during session: (P) ADL's and self-care       AM-PAC PT "6 Clicks" Mobility  Outcome Measure Help needed turning from your back to your side while in a  flat bed without using bedrails?: Total Help needed moving from lying on your back to sitting on the side of a flat bed without using bedrails?: Total Help needed moving to and from a bed to a chair (including a wheelchair)?: Total Help needed standing up from a chair using your arms (e.g., wheelchair or bedside chair)?: Total Help needed to walk in hospital room?: Total Help needed climbing 3-5 steps with a railing? : Total 6 Click Score: 6    End of Session Equipment Utilized During Treatment: Gait belt;Oxygen Activity Tolerance: Patient tolerated treatment well Patient left: in chair;with call bell/phone within reach;with chair alarm set Nurse Communication: Mobility status;Precautions PT Visit Diagnosis: Muscle weakness (generalized) (M62.81);Difficulty in walking, not elsewhere classified (R26.2);Other abnormalities of gait and mobility (R26.89)    Time: 7342-8768 PT Time Calculation (min) (ACUTE ONLY): 29 min   Charges:   PT Evaluation $PT Eval Moderate Complexity: 1 Mod          Yordan Martindale P, PT Acute Rehabilitation Services Pager: [phone] Office: [phone]   Sandy Salaam Mayana Irigoyen 02/21/2020, 8:54 AM

## 2020-02-21 NOTE — Progress Notes (Signed)
ANTICOAGULATION CONSULT NOTE  Pharmacy Consult for heparin Indication: pulmonary embolus  No Known Allergies  Patient Measurements: Height: _0  (172.7 cm) Weight: 227 lb 15.3 oz (103.4 kg) IBW/kg (Calculated) : 68.4 Heparin Dosing Weight: 94 kg  Vital Signs: Temp: 97.9 F (36.6 C) (02/28 0718) Temp Source: Oral (02/28 0718) BP: 147/96 (02/28 0813) Pulse Rate: 87 (02/28 0813)  Labs: Recent Labs    02/19/20 0544 02/19/20 0544 02/19/20 1325 02/20/20 0511 02/21/20 0251  HGB 8.8*   < >  --  8.1* 9.1*  HCT 28.8*  --   --  26.9* 30.4*  PLT 145*  --   --  179 202  LABPROT 14.3  --   --  14.5 14.1  INR 1.1  --   --  1.1 1.1  HEPARINUNFRC 0.25*   < > 0.32 0.33 0.30  CREATININE 2.67*  --   --  1.95*  --    < > = values in this interval not displayed.    Estimated Creatinine Clearance: 43.4 mL/min (A) (by C-G formula based on SCr of 1.95 mg/dL (H)).   Assessment: 69 yr old male presented with abdominal pain/SOB, hx COVID infection in January, now with bilateral PE on CT.  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  2/22 night and 2/23 morning the patient has been experiencing epistaxis and the heparin was continued until the patient experienced life threatening bleeding 2/23 afternoon and a code blue was called. The nose was packed and cauterized by ENT at this time the patient was emergently intubated and brought to the ICU.   Noted new DVT and RV strain on echo. Heparin restarted 02/16/20.  Hep lvl 0.3 this morning - will leave here d/t concern for nasal bleeding  Nasal packing removed by ENT yesterday and was extubated. Now with some minor bleeding from nose, but clotting per RN  Goal of Therapy:  Heparin level 0.3-0.5 units/ml Monitor platelets by anticoagulation protocol: Yes   Plan:  Continue heparin gtt 900 units/hr Recheck this afternoon to increase if goes < 0.3 Daily  heparin level and CBC  Barth Kirks, PharmD, BCPS, BCCCP Clinical Pharmacist [phone]  Please check AMION for all [place] numbers  02/21/2020 8:17 AM

## 2020-02-21 NOTE — Progress Notes (Signed)
Pt deteriorating and was intubated, clots came out of mouth, rhino rocket reinserted by MD pt sister updated

## 2020-02-21 NOTE — Progress Notes (Signed)
Pt noted to have increased work of breathing and wheezing, MD notified. Will continue to monitor.

## 2020-02-21 NOTE — Progress Notes (Signed)
ANTICOAGULATION CONSULT NOTE  Pharmacy Consult for heparin Indication: pulmonary embolus  No Known Allergies  Patient Measurements: Height: 5' 8" (172.7 cm) Weight: 227 lb 15.3 oz (103.4 kg) IBW/kg (Calculated) : 68.4 Heparin Dosing Weight: 94 kg  Vital Signs: Temp: 97.9 F (36.6 C) (02/28 0718) Temp Source: Oral (02/28 0718) BP: 147/96 (02/28 0813) Pulse Rate: 87 (02/28 0813)  Labs: Recent Labs    02/19/20 0544 02/19/20 0544 02/19/20 1325 02/20/20 0511 02/21/20 0251  HGB 8.8*   < >  --  8.1* 9.1*  HCT 28.8*  --   --  26.9* 30.4*  PLT 145*  --   --  179 202  LABPROT 14.3  --   --  14.5 14.1  INR 1.1  --   --  1.1 1.1  HEPARINUNFRC 0.25*   < > 0.32 0.33 0.30  CREATININE 2.67*  --   --  1.95*  --    < > = values in this interval not displayed.    Estimated Creatinine Clearance: 43.4 mL/min (A) (by C-G formula based on SCr of 1.95 mg/dL (H)).   Assessment: 69 yr old male presented with abdominal pain/SOB, hx COVID infection in January, now with bilateral PE on CT.  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  2/22 night and 2/23 morning the patient has been experiencing epistaxis and the heparin was continued until the patient experienced life threatening bleeding 2/23 afternoon and a code blue was called. The nose was packed and cauterized by ENT at this time the patient was emergently intubated and brought to the ICU.   Noted new DVT and RV strain on echo. Heparin restarted 02/16/20.  Hep lvl 0.33 - ok for lower range d/t recent significant bleeding  Goal of Therapy:  Heparin level 0.3-0.5 units/ml Monitor platelets by anticoagulation protocol: Yes   Plan:  Continue heparin gtt 900 units/hr Daily heparin level and CBC  Barth Kirks, PharmD, BCPS, BCCCP Clinical Pharmacist [phone]  Please check AMION for all [place] numbers  02/21/2020 8:17 AM

## 2020-02-21 NOTE — Evaluation (Signed)
Occupational Therapy Evaluation Patient Details Name: Jesus Russell MRN: [id number] DOB: 16-Sep-1951 Today's Date: 02/21/2020    History of Present Illness 69 yo admitted with abdominal pain with acute PE 2/21, 2/22 epistaxis, pt intubated 2/23-2/27. PMhx: Pt seen by P.T. 01/25/20 due to T6 compression fx, Lupus, covid 1/13, HTN, sarcoidosis   Clinical Impression   This 69 y/o male presents with the above. PTA pt reports independence with ADL and functional mobility. Pt currently presenting with weakness and decreased endurance, impaired cognition, decreased sitting/standing balance impacting his functional performance. Pt currently requiring mod-maxA+2 for safe completion of functional transfers, requiring modA for UB ADL and max-totalA for LB ADL. Pt with R lateral lean and tending to maintain cervical flexion while seated EOB, requiring cues to attempt to correct and maintain. VSS throughout on 3L O2. Pt reports he lives with his sister who is home during the day. He will benefit from continued acute OT services, currently recommend CIR level therapies at time of discharge to progress pt towards his PLOF. Will follow.     Follow Up Recommendations  CIR;Supervision/Assistance - 24 hour    Equipment Recommendations  Other (comment)(TBD)           Precautions / Restrictions Precautions Precautions: Fall Precaution Comments: no blowing nose Restrictions Weight Bearing Restrictions: No      Mobility Bed Mobility Overal bed mobility: Needs Assistance Bed Mobility: Rolling;Sidelying to Sit Rolling: Max assist Sidelying to sit: Max assist;+2 for physical assistance       General bed mobility comments: cues, Hand over hand assist and physical assist to roll bil. Side to sit with max +2 and physical assist to bring legs off of bed and elevate trunk  Transfers Overall transfer level: Needs assistance   Transfers: Sit to/from Stand;Stand Pivot Transfers Sit to Stand: Mod assist;+2  physical assistance Stand pivot transfers: Max assist;+2 physical assistance       General transfer comment: mod +2 from elevated bed with cues for sequence and assist of sacrum with pad and belt to rise x 2 trials. Pt noted to have nose bleed with transition to standing and RN cleared mobility for OOB to chair prior to pivot. Max +2 with sequential cues to pivot toward left to chair    Balance Overall balance assessment: Needs assistance Sitting-balance support: Feet supported Sitting balance-Leahy Scale: Fair Sitting balance - Comments: initial min assist with anterior right lean with maintained neck flexion, pt able to then progress to minguard EOB     Standing balance-Leahy Scale: Zero Standing balance comment: +2 physical assist with sacrum supported and knees blocked                           ADL either performed or assessed with clinical judgement   ADL Overall ADL's : Needs assistance/impaired Eating/Feeding: Set up;Supervision/ safety;Sitting   Grooming: Minimal assistance;Sitting   Upper Body Bathing: Moderate assistance;Sitting   Lower Body Bathing: Maximal assistance;Total assistance;Sit to/from stand;+2 for safety/equipment   Upper Body Dressing : Moderate assistance;Sitting   Lower Body Dressing: Maximal assistance;Total assistance;+2 for physical assistance;+2 for safety/equipment;Sit to/from stand   Toilet Transfer: Maximal assistance;+2 for physical assistance;+2 for safety/equipment Toilet Transfer Details (indicate cue type and reason): simulated via transfer to [place] and Hygiene: Maximal assistance;Total assistance;+2 for physical assistance;+2 for safety/equipment;Sit to/from stand;Bed level Toileting - Clothing Manipulation Details (indicate cue type and reason): pt on bed pan upon arrival, assisted with rolling/pericare  Functional mobility during ADLs: Moderate assistance;Maximal assistance;+2 for  physical assistance;+2 for safety/equipment(HHA)                    Pertinent Vitals/Pain Pain Assessment: No/denies pain     Hand Dominance     Extremity/Trunk Assessment Upper Extremity Assessment Upper Extremity Assessment: RUE deficits/detail;LUE deficits/detail RUE Deficits / Details: gross weakness in bil UEs, grossly 2/5 RUE Coordination: decreased gross motor LUE Deficits / Details: gross weakness in bil UEs, grossly 2/5 LUE Coordination: decreased gross motor   Lower Extremity Assessment Lower Extremity Assessment: Defer to PT evaluation   Cervical / Trunk Assessment Cervical / Trunk Assessment: Kyphotic;Other exceptions(lateral neck flexion )   Communication Communication Communication: No difficulties   Cognition Arousal/Alertness: Awake/alert Behavior During Therapy: Flat affect Overall Cognitive Status: Impaired/Different from baseline Area of Impairment: Orientation;Safety/judgement;Problem solving;Following commands                 Orientation Level: Disoriented to;Time     Following Commands: Follows one step commands consistently Safety/Judgement: Decreased awareness of safety;Decreased awareness of deficits   Problem Solving: Slow processing;Requires verbal cues;Requires tactile cues General Comments: pt maintaining right lower gaze unless cued with right lean and neck flexion with pt unaware. Pt stating even in current state he can ascend steps to bedroom despite education for deficits   General Comments       Exercises General Exercises - Lower Extremity Long Arc Quad: AAROM;Both;Seated;10 reps   Shoulder Instructions      Home Living Family/patient expects to be discharged to:: Private residence Living Arrangements: Other relatives(sister) Available Help at Discharge: Family;Available 24 hours/day Type of Home: House Home Access: Stairs to enter [employer] of Steps: 3 Entrance Stairs-Rails: Right Home Layout: Two  level Alternate Level Stairs-Number of Steps: 14 Alternate Level Stairs-Rails: Right Bathroom Shower/Tub: Teacher, early years/pre: Standard     Home Equipment: None          Prior Functioning/Environment Level of Independence: Independent                 OT Problem List: Decreased strength;Decreased range of motion;Decreased activity tolerance;Impaired balance (sitting and/or standing);Decreased cognition;Decreased safety awareness;Decreased knowledge of use of DME or AE;Decreased knowledge of precautions;Obesity;Impaired UE functional use;Cardiopulmonary status limiting activity      OT Treatment/Interventions: Self-care/ADL training;Therapeutic exercise;Energy conservation;DME and/or AE instruction;Therapeutic activities;Patient/family education;Balance training    OT Goals(Current goals can be found in the care plan section) Acute Rehab OT Goals Patient Stated Goal: return home OT Goal Formulation: With patient Time For Goal Achievement: 03/06/20 Potential to Achieve Goals: Good  OT Frequency: Min 2X/week   Barriers to D/C:            Co-evaluation PT/OT/SLP Co-Evaluation/Treatment: Yes Reason for Co-Treatment: For patient/therapist safety;Complexity of the patient's impairments (multi-system involvement) PT goals addressed during session: Mobility/safety with mobility;Balance OT goals addressed during session: ADL's and self-care      AM-PAC OT "6 Clicks" Daily Activity     Outcome Measure Help from another person eating meals?: A Little Help from another person taking care of personal grooming?: A Little Help from another person toileting, which includes using toliet, bedpan, or urinal?: Total Help from another person bathing (including washing, rinsing, drying)?: A Lot Help from another person to put on and taking off regular upper body clothing?: A Lot Help from another person to put on and taking off regular lower body clothing?: Total 6 Click  Score: 12   End  of Session Equipment Utilized During Treatment: Gait belt;Oxygen Nurse Communication: Mobility status  Activity Tolerance: Patient tolerated treatment well Patient left: in chair;with call bell/phone within reach;with chair alarm set;with nursing/sitter in room  OT Visit Diagnosis: Muscle weakness (generalized) (M62.81);Other abnormalities of gait and mobility (R26.89)                Time: 9480-1655 OT Time Calculation (min): 29 min Charges:  OT General Charges $OT Visit: 1 Visit OT Evaluation $OT Eval Moderate Complexity: [place], OT [employer] Pager [phone] Office [phone]   Raymondo Band 02/21/2020, 9:13 AM

## 2020-02-21 NOTE — Progress Notes (Signed)
ANTICOAGULATION CONSULT NOTE  Pharmacy Consult for heparin Indication: pulmonary embolus  No Known Allergies  Patient Measurements: Height: _0  (172.7 cm) Weight: 227 lb 15.3 oz (103.4 kg) IBW/kg (Calculated) : 68.4 Heparin Dosing Weight: 94 kg  Vital Signs: Temp: 97.5 F (36.4 C) (02/28 1512) Temp Source: Oral (02/28 1512) BP: 142/85 (02/28 1700) Pulse Rate: 95 (02/28 1600)  Labs: Recent Labs    02/19/20 0544 02/19/20 1325 02/20/20 0511 02/21/20 0251 02/21/20 0939 02/21/20 1627  HGB 8.8*  --  8.1* 9.1*  --   --   HCT 28.8*  --  26.9* 30.4*  --   --   PLT 145*  --  179 202  --   --   LABPROT 14.3  --  14.5 14.1  --   --   INR 1.1  --  1.1 1.1  --   --   HEPARINUNFRC 0.25*   < > 0.33 0.30  --  0.27*  CREATININE 2.67*  --  1.95*  --  1.45*  --    < > = values in this interval not displayed.    Estimated Creatinine Clearance: 58.4 mL/min (A) (by C-G formula based on SCr of 1.45 mg/dL (H)).   Assessment: 69 yr old male presented with abdominal pain/SOB, hx COVID infection in January, now with bilateral PE on CT.  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  2/22 night and 2/23 morning the patient has been experiencing epistaxis and the heparin was continued until the patient experienced life threatening bleeding 2/23 afternoon and a code blue was called. The nose was packed and cauterized by ENT at this time the patient was emergently intubated and brought to the ICU.   Noted new DVT and RV strain on echo. Heparin restarted 02/16/20.  Nasal packing removed by ENT yesterday and was extubated>> repacked this evening. Heparin level this evening came back at 0.27, on 900 units/hr. Hgb 9.1 (stable), plt 202 (trending up). Continues to have nose bleeding per RN - will monitor closely.   Goal of Therapy:  Heparin level 0.3-0.5 units/ml Monitor platelets by anticoagulation protocol: Yes    Plan:  Given burden of VTEs, will increase conservatively to 950 units/hr to get into therapeutic range Daily heparin level and CBC  Antonietta Jewel, PharmD, BCCCP Clinical Pharmacist  Phone: [phone]  Please check AMION for all [place] phone numbers After 10:00 PM, call [place] [phone] 02/21/2020 5:33 PM

## 2020-02-21 NOTE — Progress Notes (Signed)
Assisted Dr. Valeta Harms with Intubation

## 2020-02-21 NOTE — Progress Notes (Signed)
  Speech Language Pathology Treatment: Dysphagia  Patient Details Name: Jesus Russell MRN: [id number] DOB: 09/29/1951 Today's Date: 02/21/2020 Time: 1219-7588 SLP Time Calculation (min) (ACUTE ONLY): 20 min  Assessment / Plan / Recommendation Clinical Impression  Pt was seen for skilled ST targeting dysphagia and diet tolerance.  RN reported that pt currently had bleeding from his nose and that she had concerns for aspiration.  Pt was encountered asleep in bed and he roused to moderate verbal and tactile stimulation.  Pt was much more lethargic in this session in comparison to yesterday's BSE, and he stated that he felt worse today.  Pt was observed to have slow, but active bleeding from his L nare.  This was monitored throughout the session.  Pt consumed trials of ice chips, thin liquid, nectar-thick liquid, and minimal trials of puree.  AP transport was prolonged with all trials and desaturation was observed consistently with ice chip, thin liquid, and nectar-thick liquid trials.  Pt's O2 saturation decreased from the high 90s to the low 90s/high 80s with liquid trials.  No additional clinical s/sx of aspiration were observed with any trials including throat clear or cough; however, pt is at an elevated risk for silent aspiration following recent intubation.  Recommend that pt be NPO for the remainder of the day with plans to re-evaluate his swallow function in the next 24-36 hours.  Pt may have a few small ice chips PRN following thorough oral care given full RN supervision.  Essential PO meds may be administered crushed in puree.  SLP will f/u closely.     HPI HPI: [name] is a 69 year old male with a past medical history significant for positive lupus anticoagulant, PE (2012), DVT, IVC filter, COVID-19 infection on 1/13, HTN, hypertension, and sarcoidosis who presented to the ED with left lower chest/left upper quadrant abdominal pain.   Pt was intubated from 2/23-2/27. CXR on 2/27 reported  "Bilateral airspace densities unchanged and appear chronic. No acute infiltrate or effusion."        SLP Plan  Goals updated       Recommendations  Diet recommendations: NPO Liquids provided via: Teaspoon Medication Administration: Crushed with puree                Oral Care Recommendations: Oral care QID;Staff/trained caregiver to provide oral care;Oral care prior to ice chip/H20 Follow up Recommendations: Other (comment)(TBD) SLP Visit Diagnosis: Dysphagia, unspecified (R13.10) Plan: Goals updated                     Colin Mulders M.S., [place] [facility] Office: [phone]  [place] 02/21/2020, 10:23 AM

## 2020-02-22 DIAGNOSIS — I2699 Other pulmonary embolism without acute cor pulmonale: Secondary | ICD-10-CM

## 2020-02-22 LAB — CBC
HCT: 28.5 % — ABNORMAL LOW (ref 39.0–52.0)
Hemoglobin: 9.1 g/dL — ABNORMAL LOW (ref 13.0–17.0)
MCH: 33.8 pg (ref 26.0–34.0)
MCHC: 31.9 g/dL (ref 30.0–36.0)
MCV: 105.9 fL — ABNORMAL HIGH (ref 80.0–100.0)
Platelets: 207 K/uL (ref 150–400)
RBC: 2.69 MIL/uL — ABNORMAL LOW (ref 4.22–5.81)
RDW: 16.4 % — ABNORMAL HIGH (ref 11.5–15.5)
WBC: 15 K/uL — ABNORMAL HIGH (ref 4.0–10.5)
nRBC: 0.3 % — ABNORMAL HIGH (ref 0.0–0.2)

## 2020-02-22 LAB — GLUCOSE, CAPILLARY
Glucose-Capillary: 50 mg/dL — ABNORMAL LOW (ref 70–99)
Glucose-Capillary: 137 mg/dL — ABNORMAL HIGH (ref 70–99)
Glucose-Capillary: 101 mg/dL — ABNORMAL HIGH (ref 70–99)
Glucose-Capillary: 96 mg/dL (ref 70–99)
Glucose-Capillary: 72 mg/dL (ref 70–99)
Glucose-Capillary: 95 mg/dL (ref 70–99)

## 2020-02-22 LAB — BASIC METABOLIC PANEL
Anion gap: 10 (ref 5–15)
BUN: 45 mg/dL — ABNORMAL HIGH (ref 8–23)
CO2: 35 mmol/L — ABNORMAL HIGH (ref 22–32)
Calcium: 8.9 mg/dL (ref 8.9–10.3)
Chloride: 104 mmol/L (ref 98–111)
Creatinine, Ser: 1.27 mg/dL — ABNORMAL HIGH (ref 0.61–1.24)
GFR calc Af Amer: >60 mL/min (ref >60)
GFR calc non Af Amer: 58 mL/min — ABNORMAL LOW (ref >60)
Glucose, Bld: 109 mg/dL — ABNORMAL HIGH (ref 70–99)
Potassium: 3.6 mmol/L (ref 3.5–5.1)
Sodium: 149 mmol/L — ABNORMAL HIGH (ref 135–145)

## 2020-02-22 LAB — HEPARIN LEVEL (UNFRACTIONATED)
Heparin Unfractionated: 0.43 [IU]/mL (ref 0.30–0.70)
Heparin Unfractionated: 0.39 IU/mL (ref 0.30–0.70)

## 2020-02-22 LAB — PROTIME-INR
INR: 1.1 (ref 0.8–1.2)
Prothrombin Time: 14.4 seconds (ref 11.4–15.2)

## 2020-02-22 LAB — TRIGLYCERIDES: Triglycerides: 103 mg/dL (ref <150)

## 2020-02-22 MED ORDER — VITAL HIGH PROTEIN PO LIQD: 1000.0000 mL | ORAL | Status: DC

## 2020-02-22 MED ORDER — VITAL HIGH PROTEIN PO LIQD
1000.0000 mL | ORAL | Status: DC
  Administered 2020-02-22 – 2020-02-24 (×4): 1000 mL

## 2020-02-22 MED ORDER — POTASSIUM CHLORIDE CRYS ER 20 MEQ PO TBCR: 40.0000 meq | EXTENDED_RELEASE_TABLET | Freq: Once | ORAL | Status: DC

## 2020-02-22 MED ORDER — SODIUM CHLORIDE 0.9 % IV SOLN
3.0000 g | Freq: Four times a day (QID) | INTRAVENOUS | Status: AC
  Administered 2020-02-22 – 2020-02-25 (×14): 3 g via INTRAVENOUS
  Filled 2020-02-22 (×7): qty 8
  Filled 2020-02-22: qty 3
  Filled 2020-02-22 (×5): qty 8

## 2020-02-22 MED ORDER — FUROSEMIDE 10 MG/ML IJ SOLN
40.0000 mg | Freq: Once | INTRAMUSCULAR | Status: AC
  Administered 2020-02-22: 21:00:00 40 mg via INTRAVENOUS
  Filled 2020-02-22: qty 4

## 2020-02-22 MED ORDER — DEXTROSE 50 % IV SOLN
INTRAVENOUS | Status: AC
  Administered 2020-02-22: 50 mL
  Filled 2020-02-22: qty 50

## 2020-02-22 MED ORDER — SODIUM CHLORIDE 0.9% FLUSH
10.0000 mL | Freq: Two times a day (BID) | INTRAVENOUS | Status: DC
  Administered 2020-02-22 – 2020-02-29 (×14): 10 mL

## 2020-02-22 MED ORDER — PRO-STAT SUGAR FREE PO LIQD
60.0000 mL | Freq: Three times a day (TID) | ORAL | Status: DC
  Administered 2020-02-22 – 2020-02-25 (×7): 60 mL
  Filled 2020-02-22 (×7): qty 60

## 2020-02-22 MED ORDER — PRO-STAT SUGAR FREE PO LIQD: 30.0000 mL | Freq: Two times a day (BID) | ORAL | Status: DC

## 2020-02-22 MED ORDER — POTASSIUM CHLORIDE 20 MEQ/15ML (10%) PO SOLN
40.0000 meq | Freq: Once | ORAL | Status: AC
  Administered 2020-02-22: 40 meq
  Filled 2020-02-22: qty 30

## 2020-02-22 MED ORDER — DEXTROSE IN LACTATED RINGERS 5 % IV SOLN: INTRAVENOUS | Status: DC

## 2020-02-22 MED ORDER — SODIUM CHLORIDE 0.9% FLUSH: 10.0000 mL | INTRAVENOUS | Status: DC | PRN

## 2020-02-22 NOTE — Progress Notes (Signed)
Inpatient Rehab Admissions:  Inpatient Rehab Consult received.  Note pt retintubated yesterday.  Will follow from a distance before initiating rehab assessment.   Signed: Shann Medal, PT, DPT Admissions Coordinator [phone] 02/22/20  12:40 PM

## 2020-02-22 NOTE — Progress Notes (Addendum)
NAME:  Merland Holness, MRN:  [id number], DOB:  03-22-1951, LOS: 7 ADMISSION DATE:  02/14/2020, CONSULTATION DATE:  2/23  REFERRING MD:  Dr. Rebeca Alert CHIEF COMPLAINT: acute respiratory failure in setting of massive epistaxis    Brief History   69 year old male with history of recent COVID (01/06/2020) sarcoidosis, antiphospholipid antibody syndrome, prior PE and DVT, had been on Coumadin but had stopped this in order to take naproxen.  Admitted 2/21 with acute PE.  Developed worsening epistaxis and progressive respiratory failure requiring intubation on 2/23.  Past Medical History  Sarcoidosis, antiphospholipid ab syndrome. Recent PE.  Significant Hospital Events   2/21 admitted + PE 2/22 started to have epistaxis.  2/23 Epistaxis continued. Left nare packed. Intubation w/ FOB negative for airway obstruction. ECHO w/ new RV failure suggesting progression of PE. Had obstructive shock physiology. Heparin restarted. Supported on [employer]. + DVT BLEs.  2/24 Pressor requirements improving. Not ready to wean. New AKI. But bleeding seems to have stopped.  2/26 Nasal packing in place, no further bleeding  2/27 Nasal packing removed per ENT, initially no bleeding. Pt extubated.  2/28 Recurrent nose bleeding with respiratory distress requiring intubation, repeat nasal packing.    Consults:  ENT Pulmonary critical care  Procedures:  ETT 2/23 >> 2/27  Left [place] TLC 2/23 >> 2/27?  Significant Diagnostic Tests:   2/21 CT angio chest: Positive for pulmonary emboli with combination of occlusive and nonocclusive embolus in the lower lobe lungs bilaterally from segmental to subsegmental vessel size  2/22 Echocardiogram: Demonstrated LVEF 60 to 47% grade 1 diastolic dysfunction RV severely enlarged tricuspid regurgitation RA was normal  2/23 [place] BLE >> Findings consistent with acute deep vein thrombosis involving the right common femoral vein, SF junction, right femoral vein, right proximal profunda vein, right  popliteal vein, right posterior tibial veins, and right gastrocnemius veins- Findings consistent with acute deep vein thrombosis involving the left posterior tibial veins.  2/23 ECHO >> Left ventricular ejection fraction, by estimation, is 50 to 55%. The  left ventricle has low normal function. The left ventricle has no regional  wall motion abnormalities.  The images are very technically difficult . The RV could only be seen using Definity contrast. The RV appears to be very dilated and with severely reduced RV function There is moderate pulmonary artery hypertension . Right ventricular systolic function is severely reduced. The right ventricular size is  severely enlarged. There is moderately elevated pulmonary artery systolic pressure.    Micro Data:  COVID 2/21 >> positive  Antimicrobials:  Unasyn 2/23 >>2/27 Unasyn 3/1 >>  Interim history/subjective:  Re-intubated late pm 2/28.  Remains on vent.  RN reports pt remains on continuous sedation.   Objective   Blood pressure 132/85, pulse 63, temperature 97.7 F (36.5 C), temperature source Oral, resp. rate 14, height _0  (1.727 m), weight 99 kg, SpO2 94 %.    Vent Mode: PRVC FiO2 (%):  [30 %-100 %] 30 % Set Rate:  [14 bmp] 14 bmp Vt Set:  [560 mL] 560 mL PEEP:  [5 cmH20] 5 cmH20 Plateau Pressure:  [19 cmH20-28 cmH20] 28 cmH20   Intake/Output Summary (Last 24 hours) at 02/22/2020 0855 Last data filed at 02/22/2020 0800 Gross per 24 hour  Intake 955.53 ml  Output 1905 ml  Net -949.47 ml   Filed Weights   02/18/20 0249 02/21/20 0428 02/22/20 0420  Weight: 101.7 kg 103.4 kg 99 kg    Examination: General: Chronically ill-appearing adult male in no acute  distress on vent HEENT: MM pink/moist, ETT, pupils equal/reactive, anicteric, left nare nasal packing Neuro: Awakens to voice, follows commands, nods yes / no to questions, generalized weakness but symmetrical bilaterally CV: s1s2 RRR, occasional PVCs noted on monitor, no  m/r/g PULM: non-labored on mechanical ventilation, vent assisted breaths  GI: soft, bsx4 active, gastric tube in place Extremities: warm/dry, BLE 2-3+ pitting edema  Skin: no rashes or lesions    Resolved Hospital Problem list   Circulatory Shock / Obstructive Shock 2/2 PE w/ Acute Right Ventricular Failure c/b underlying chronic PH- resolved Constipation: miralax PRN  Assessment & Plan:   Acute hypoxic respiratory failure, in setting of acute PE +/- aspiration event with epistaxis.  Large clot on ETT when extubated 2/27.  Failed with recurrent nose bleed requiring re-packing & intubation 2/28 pm.  -PRVC 8cc/kg  -wean PEEP / fiO2 for sats >90% -ok for PSV as tolerated but hold extubation given re-bleed -ENT called back for re-evaluation, appreciate input -PAD Protocol for RASS of 0 to -1  Acute pulmonary embolus w/ bilateral acute DVT, livedo reticularis Antiphospholipid Antibody -continue heparin gtt, transition to oral anticoagulation given recurrent bleeding -Continue neurovascular checks on bilateral lower extremities  Epistaxis -Nasal packing in place -ENT evaluation appreciated, have reviewed with Dr. Redmond Baseman -Add empiric Unasyn given nasal packing  AKI  Suspect ATN 2/2 decreased end-organ perfusion in shock state -Trend BMP / urinary output -Replace electrolytes as indicated -Avoid nephrotoxic agents, ensure adequate renal perfusion - f/u AM chemistries  History of Sarcoidosis Recently tapered off steroids -Continue current bronchodilators -Empiric P JP prophylaxis with Bactrim on hold  Subacute T8 fracture Was on chronic steroids / taper, reportedly off on admit  -Supportive care  At Risk Malnutrition  -begin TF per Nutrition   Metabolic encephalopathy vs. Cognitive deficit Lives in facility at baseline - it is a drug / rehab program facility but stayed on and lives there.  Would typically wake around noon, eat breakfast, read, watch TV. Slept a lot in the  last few months, confusion noted at times. 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 Was private / didn't share medical info with family.  -supportive care -PT efforts  -promote sleep / wake cycle   Hypoglycemia  -resume TF   Best practice:  Diet: Dysphagia Pain/Anxiety/Delirium protocol (if indicated): in place VAP protocol (if indicated): ordered DVT prophylaxis: heparin infusion GI prophylaxis: PTA PPI Glucose control: Not indicated Mobility: PT/OT Code Status: Full code Family Communication: Sister Vicente Males) called for update 3/1. Disposition: ICU   CC Time: 29 minutes    Noe Gens, MSN, NP-C [facility] 02/22/2020, 8:55 AM   Please see [email or web address] for pager details.

## 2020-02-22 NOTE — Progress Notes (Signed)
ANTICOAGULATION CONSULT NOTE  Pharmacy Consult for heparin Indication: pulmonary embolus  No Known Allergies  Patient Measurements: Height: _0  (172.7 cm) Weight: 218 lb 4.1 oz (99 kg) IBW/kg (Calculated) : 68.4 Heparin Dosing Weight: 94 kg  Vital Signs: Temp: 98.2 F (36.8 C) (03/01 0409) Temp Source: Oral (03/01 0409) BP: 124/80 (03/01 0600) Pulse Rate: 68 (03/01 0600)  Labs: Recent Labs    02/20/20 0511 02/20/20 0511 02/21/20 0251 02/21/20 0939 02/21/20 1627 02/21/20 1845 02/22/20 0606  HGB 8.1*   < > 9.1*  --   --  9.5*  --   HCT 26.9*  --  30.4*  --   --  28.0*  --   PLT 179  --  202  --   --   --   --   LABPROT 14.5  --  14.1  --   --   --  14.4  INR 1.1  --  1.1  --   --   --  1.1  HEPARINUNFRC 0.33   < > 0.30  --  0.27*  --  0.43  CREATININE 1.95*  --   --  1.45*  --   --   --    < > = values in this interval not displayed.    Estimated Creatinine Clearance: 57.1 mL/min (A) (by C-G formula based on SCr of 1.45 mg/dL (H)).   Assessment: 69 yr old male presented with abdominal pain/SOB, hx COVID infection in January, now with bilateral PE on CT.  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  2/22 night and 2/23 morning the patient has been experiencing epistaxis and the heparin was continued until the patient experienced life threatening bleeding 2/23 afternoon and a code blue was called. The nose was packed and cauterized by ENT at this time the patient was emergently intubated and brought to the ICU.   Noted new DVT and RV strain on echo. Heparin restarted 02/16/20.  Nasal packing removed by ENT yesterday and was extubated>> repacked this evening. Heparin level this evening came back at 0.27, on 900 units/hr. Hgb 9.1 (stable), plt 202 (trending up). Continues to have nose bleeding per RN - will monitor closely.   3/1 AM update:  Heparin level therapeutic after slight  rate increase Re-intubated overnight for airway protection  Goal of Therapy:  Heparin level 0.3-0.5 units/ml Monitor platelets by anticoagulation protocol: Yes   Plan:  Cont heparin at 950 units/hr Daily heparin level and CBC Monitor closely for any significant increase in bleeding  Narda Bonds, PharmD, [place] Pharmacist Phone: [phone]

## 2020-02-22 NOTE — Progress Notes (Signed)
SLP Cancellation Note  Patient Details Name: Prentiss Polio MRN: [id number] DOB: [birth year]   Cancelled treatment:       Reason Eval/Treat Not Completed: Patient not medically ready. Pt reintubated. Will sign off and await new orders when medically ready.     Greycen Felter, Katherene Ponto 02/22/2020, 10:35 AM

## 2020-02-22 NOTE — Progress Notes (Signed)
ANTICOAGULATION CONSULT NOTE  Pharmacy Consult for heparin Indication: pulmonary embolus  No Known Allergies  Patient Measurements: Height: _0  (172.7 cm) Weight: 218 lb 4.1 oz (99 kg) IBW/kg (Calculated) : 68.4 Heparin Dosing Weight: 94 kg  Vital Signs: Temp: 99 F (37.2 C) (03/01 1115) Temp Source: Oral (03/01 1115) BP: 140/86 (03/01 1104) Pulse Rate: 73 (03/01 1104)  Labs: Recent Labs    02/20/20 0511 02/20/20 0511 02/21/20 0251 02/21/20 0251 02/21/20 0939 02/21/20 1627 02/21/20 1845 02/22/20 0606  HGB 8.1*   < > 9.1*   < >  --   --  9.5* 9.1*  HCT 26.9*   < > 30.4*  --   --   --  28.0* 28.5*  PLT 179  --  202  --   --   --   --  207  LABPROT 14.5  --  14.1  --   --   --   --  14.4  INR 1.1  --  1.1  --   --   --   --  1.1  HEPARINUNFRC 0.33   < > 0.30  --   --  0.27*  --  0.43  CREATININE 1.95*  --   --   --  1.45*  --   --   --    < > = values in this interval not displayed.    Estimated Creatinine Clearance: 57.1 mL/min (A) (by C-G formula based on SCr of 1.45 mg/dL (H)).   Assessment: 69 yr old male presented with abdominal pain/SOB, hx COVID infection in January, now with bilateral PE on CT.  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  Noted new DVT and RV strain on echo. Heparin restarted 02/16/20.  2/22 night and 2/23 morning the patient has been experiencing epistaxis and the heparin was continued until the patient experienced life threatening bleeding 2/23 afternoon and a code blue was called. The nose was packed and cauterized by ENT at this time the patient was emergently intubated and brought to the ICU.   Nasal packing removed by ENT 2/27 and the patient was extubated however epistaxis recurred requiring repeat nasal packing and intubation.   Heparin level this afternoon remains therapeutic (HL 0.39 << 0.43, goal of 0.3-0.5). No further issues with epistaxis  per discussion with RN. Will continue to monitor closely, CBC stable today.  Goal of Therapy:  Heparin level 0.3-0.5 units/ml Monitor platelets by anticoagulation protocol: Yes   Plan:  - Continue Heparin at 950 units/hr (9.5 ml/hr) - Will continue to monitor for any signs/symptoms of bleeding and will follow up with heparin level in the a.m.   Thank you for allowing pharmacy to be a part of this patient's care.  Alycia Rossetti, PharmD, BCPS Clinical Pharmacist Clinical phone for 02/22/2020: [phone] 02/22/2020 1:13 PM   **Pharmacist phone directory can now be found on [email or web address] ([email or web address]).  Listed under [place].

## 2020-02-22 NOTE — Progress Notes (Signed)
eLink Physician-Brief Progress Note Patient Name: Kadyn Guild DOB: 1951/04/26 MRN: [id number]   Date of Service  02/22/2020  HPI/Events of Note  Patient intubated for airway protection. Now with hypoglycemic episode in the 52s.  eICU Interventions  Ordered to start D5 LR until TF can be started in am     Intervention Category Major Interventions: Other:  Judd Lien 02/22/2020, 4:36 AM

## 2020-02-22 NOTE — Progress Notes (Signed)
Nutrition Follow-up / Consult  DOCUMENTATION CODES:   Obesity unspecified  INTERVENTION:    Vital High Protein at 30 ml/h (720 ml per day)   Pro-stat 60 ml TID   Provides 1320 kcal (1478 kcal total with propofol), 153 gm protein, 602 ml free water daily  NUTRITION DIAGNOSIS:   Inadequate oral intake related to inability to eat as evidenced by NPO status.  Ongoing  GOAL:   Provide needs based on ASPEN/SCCM guidelines   Met with TF  MONITOR:   Vent status, TF tolerance, Labs  REASON FOR ASSESSMENT:   Ventilator, Consult Enteral/tube feeding initiation and management  ASSESSMENT:   69 yo male admitted with PE. Developed acute respiratory failure in the setting of massive epistaxis and was intubated on 2/23. PMH includes HTN, sarcoidosis, antiphospholipid antibody syndrome.   Extubated 2/27. Diet advanced to dysphagia 1 with thin liquids, then re-intubated 2/28.  Received MD Consult for TF initiation and management. OG tube in place.   Patient is currently intubated on ventilator support. MV: 7.6 L/min Temp (24hrs), Avg:98 F (36.7 C), Min:97.5 F (36.4 C), Max:99 F (37.2 C)  Propofol: 6 ml/hr providing 158 kcal from lipid  Labs reviewed. BUN 61, Creat 1.45 CBG's: [phone]  Medications reviewed and include vitamin D3, novolog, propofol.   Diet Order:   Diet Order            Diet NPO time specified Except for: Other (See Comments)  Diet effective now              EDUCATION NEEDS:   Not appropriate for education at this time  Skin:  Skin Assessment: Reviewed RN Assessment  Last BM:  2/28  Height:   Ht Readings from Last 1 Encounters:  02/16/20 5' 8" (1.727 m)    Weight:   Wt Readings from Last 1 Encounters:  02/22/20 99 kg    Ideal Body Weight:  70 kg  BMI:  Body mass index is 33.19 kg/m.  Estimated Nutritional Needs:   Kcal:  1150-1350  Protein:  140 gm  Fluid:  >/= 1.8 L    Molli Barrows, RD, LDN,  CNSC Please refer to Amion for contact information.

## 2020-02-22 NOTE — Progress Notes (Signed)
Pharmacy Antibiotic Note  Jesus Russell is a 69 y.o. male admitted on 02/14/2020 with epistaxis requiring nasal packing and concern for sinusitis. Pharmacy has been consulted for Unasyn dosing.  Plan: - Restart Unasyn 3g IV every 6 hours - Will continue to follow renal function, culture results, LOT, and antibiotic de-escalation plans   Height: 5' 8" (172.7 cm) Weight: 218 lb 4.1 oz (99 kg) IBW/kg (Calculated) : 68.4  Temp (24hrs), Avg:98 F (36.7 C), Min:97.5 F (36.4 C), Max:99 F (37.2 C)  Recent Labs  Lab 02/17/20 1612 02/18/20 0132 02/18/20 0252 02/19/20 0544 02/20/20 0511 02/21/20 0251 02/21/20 0939 02/22/20 0606  WBC  --    < > 19.3* 16.8* 12.3* 17.6*  --  15.0*  CREATININE 2.86*  --  3.12* 2.67* 1.95*  --  1.45*  --    < > = values in this interval not displayed.    Estimated Creatinine Clearance: 57.1 mL/min (A) (by C-G formula based on SCr of 1.45 mg/dL (H)).    No Known Allergies  Antimicrobials this admission: Unasyn 2/23 >> 2/28; restart 3/1 >>  Dose adjustments this admission: N/a  Microbiology results: 2/21 COVID >> positive  Thank you for allowing pharmacy to be a part of this patient's care.  Alycia Rossetti, PharmD, BCPS Clinical Pharmacist Clinical phone for 02/22/2020: [phone] 02/22/2020 12:00 PM   **Pharmacist phone directory can now be found on [email or web address] ([email or web address]).  Listed under [place].

## 2020-02-22 NOTE — Progress Notes (Signed)
Inpatient Diabetes Program Recommendations  AACE/ADA: New Consensus Statement on Inpatient Glycemic Control (2015)  Target Ranges:  Prepandial:   less than 140 mg/dL      Peak postprandial:   less than 180 mg/dL (1-2 hours)      Critically ill patients:  140 - 180 mg/dL   Lab Results  Component Value Date   GLUCAP 101 (H) 02/22/2020   HGBA1C 6.1 (H) 02/17/2020    Review of Glycemic Control Results for ARATH, KAIGLER (MRN [id number]) as of 02/22/2020 09:44  Ref. Range 02/21/2020 07:16 02/21/2020 11:09 02/21/2020 15:12 02/21/2020 19:32 02/21/2020 23:39 02/22/2020 04:04 02/22/2020 04:36 02/22/2020 07:42  Glucose-Capillary Latest Ref Range: 70 - 99 mg/dL 94 89 99 98 82 50 (L) 137 (H) 101 (H)   Diabetes history: None  Current orders for Inpatient glycemic control: Novolog Moderate 0-15 units Q4 hours  A1c 6.1% on 2/24 BUN/Creat: 61/1.45  Inpatient Diabetes Program Recommendations:    Renal function elevated. Pt had hypoglycemia 50 at 0400 am this am.  Consider decreasing Novolog Correction to sensitive 0-9 units Q4 hours.  Thanks,  Tama Headings RN, MSN, BC-ADM Inpatient Diabetes Coordinator Team Pager [phone] (8a-5p)

## 2020-02-23 ENCOUNTER — Inpatient Hospital Stay (HOSPITAL_COMMUNITY): Payer: Medicare Other

## 2020-02-23 DIAGNOSIS — Z86711 Personal history of pulmonary embolism: Secondary | ICD-10-CM

## 2020-02-23 DIAGNOSIS — Z7901 Long term (current) use of anticoagulants: Secondary | ICD-10-CM

## 2020-02-23 LAB — BASIC METABOLIC PANEL
Anion gap: 9 (ref 5–15)
BUN: 47 mg/dL — ABNORMAL HIGH (ref 8–23)
CO2: 36 mmol/L — ABNORMAL HIGH (ref 22–32)
Calcium: 8.9 mg/dL (ref 8.9–10.3)
Chloride: 103 mmol/L (ref 98–111)
Creatinine, Ser: 1.28 mg/dL — ABNORMAL HIGH (ref 0.61–1.24)
GFR calc Af Amer: >60 mL/min (ref >60)
GFR calc non Af Amer: 58 mL/min — ABNORMAL LOW (ref >60)
Glucose, Bld: 118 mg/dL — ABNORMAL HIGH (ref 70–99)
Potassium: 3.6 mmol/L (ref 3.5–5.1)
Sodium: 148 mmol/L — ABNORMAL HIGH (ref 135–145)

## 2020-02-23 LAB — GLUCOSE, CAPILLARY
Glucose-Capillary: 104 mg/dL — ABNORMAL HIGH (ref 70–99)
Glucose-Capillary: 109 mg/dL — ABNORMAL HIGH (ref 70–99)
Glucose-Capillary: 113 mg/dL — ABNORMAL HIGH (ref 70–99)
Glucose-Capillary: 118 mg/dL — ABNORMAL HIGH (ref 70–99)
Glucose-Capillary: 126 mg/dL — ABNORMAL HIGH (ref 70–99)
Glucose-Capillary: 95 mg/dL (ref 70–99)
Glucose-Capillary: 97 mg/dL (ref 70–99)

## 2020-02-23 LAB — CBC
HCT: 28.1 % — ABNORMAL LOW (ref 39.0–52.0)
Hemoglobin: 8.5 g/dL — ABNORMAL LOW (ref 13.0–17.0)
MCH: 32.7 pg (ref 26.0–34.0)
MCHC: 30.2 g/dL (ref 30.0–36.0)
MCV: 108.1 fL — ABNORMAL HIGH (ref 80.0–100.0)
Platelets: 276 10*3/uL (ref 150–400)
RBC: 2.6 MIL/uL — ABNORMAL LOW (ref 4.22–5.81)
RDW: 16.2 % — ABNORMAL HIGH (ref 11.5–15.5)
WBC: 13 10*3/uL — ABNORMAL HIGH (ref 4.0–10.5)
nRBC: 0.5 % — ABNORMAL HIGH (ref 0.0–0.2)

## 2020-02-23 LAB — PROTIME-INR
INR: 1.2 (ref 0.8–1.2)
Prothrombin Time: 14.6 seconds (ref 11.4–15.2)

## 2020-02-23 LAB — HEPARIN LEVEL (UNFRACTIONATED)
Heparin Unfractionated: 0.29 IU/mL — ABNORMAL LOW (ref 0.30–0.70)
Heparin Unfractionated: 0.44 IU/mL (ref 0.30–0.70)

## 2020-02-23 MED ORDER — FUROSEMIDE 10 MG/ML IJ SOLN
40.0000 mg | Freq: Once | INTRAMUSCULAR | Status: AC
  Administered 2020-02-23: 40 mg via INTRAVENOUS
  Filled 2020-02-23: qty 4

## 2020-02-23 MED ORDER — FREE WATER
200.0000 mL | Freq: Three times a day (TID) | Status: DC
  Administered 2020-02-23 – 2020-02-24 (×4): 200 mL

## 2020-02-23 MED ORDER — POTASSIUM CHLORIDE 20 MEQ/15ML (10%) PO SOLN
40.0000 meq | Freq: Once | ORAL | Status: AC
  Administered 2020-02-23: 40 meq
  Filled 2020-02-23: qty 30

## 2020-02-23 MED ORDER — DEXMEDETOMIDINE HCL IN NACL 400 MCG/100ML IV SOLN
0.0000 ug/kg/h | INTRAVENOUS | Status: AC
  Administered 2020-02-23: 1.1 ug/kg/h via INTRAVENOUS
  Administered 2020-02-23: 1 ug/kg/h via INTRAVENOUS
  Administered 2020-02-23: 0.4 ug/kg/h via INTRAVENOUS
  Administered 2020-02-24: 0.9 ug/kg/h via INTRAVENOUS
  Administered 2020-02-24: 0.5 ug/kg/h via INTRAVENOUS
  Administered 2020-02-24: 0.9 ug/kg/h via INTRAVENOUS
  Administered 2020-02-25: 1 ug/kg/h via INTRAVENOUS
  Administered 2020-02-25: 0.8 ug/kg/h via INTRAVENOUS
  Filled 2020-02-23 (×8): qty 100

## 2020-02-23 MED ORDER — ATROPINE SULFATE 1 MG/10ML IJ SOSY: 1.0000 mg | PREFILLED_SYRINGE | Freq: Once | INTRAMUSCULAR | Status: DC | PRN

## 2020-02-23 MED ORDER — IPRATROPIUM-ALBUTEROL 0.5-2.5 (3) MG/3ML IN SOLN: 3.0000 mL | RESPIRATORY_TRACT | Status: DC | PRN

## 2020-02-23 NOTE — Progress Notes (Signed)
PT Cancellation Note  Patient Details Name: Anes Rigel MRN: [id number] DOB: 12-13-1951   Cancelled Treatment:    Reason Eval/Treat Not Completed: Medical issues which prohibited therapy(Nursing states pt is still sedated. Check back tomorrow. )   Denice Paradise 02/23/2020, 11:31 AM  Treshon Stannard W,PT Acute Rehabilitation Services Pager:  [phone]  Office:  [phone]

## 2020-02-23 NOTE — Progress Notes (Signed)
ANTICOAGULATION CONSULT NOTE  Pharmacy Consult for heparin Indication: pulmonary embolus  No Known Allergies  Patient Measurements: Height: 5' 8" (172.7 cm) Weight: 220 lb 7.4 oz (100 kg) IBW/kg (Calculated) : 68.4 Heparin Dosing Weight: 94 kg  Vital Signs: Temp: 98.2 F (36.8 C) (03/02 1100) Temp Source: Axillary (03/02 1100) BP: 126/91 (03/02 1121) Pulse Rate: 90 (03/02 1121)  Labs: Recent Labs     0000 02/21/20 0251 02/21/20 0939 02/21/20 1627 02/21/20 1845 02/22/20 0606 02/22/20 0606 02/22/20 1229 02/22/20 1543 02/23/20 0255 02/23/20 1200  HGB   < > 9.1*  --    < > 9.5* 9.1*  --   --   --  8.5*  --   HCT   < > 30.4*  --   --  28.0* 28.5*  --   --   --  28.1*  --   PLT  --  202  --   --   --  207  --   --   --  276  --   LABPROT  --  14.1  --   --   --  14.4  --   --   --  14.6  --   INR  --  1.1  --   --   --  1.1  --   --   --  1.2  --   HEPARINUNFRC  --  0.30  --    < >  --  0.43   < > 0.39  --  0.29* 0.44  CREATININE  --   --  1.45*  --   --   --   --   --  1.27* 1.28*  --    < > = values in this interval not displayed.    Estimated Creatinine Clearance: 65 mL/min (A) (by C-G formula based on SCr of 1.28 mg/dL (H)).   Assessment: 69 yr old male presented with abdominal pain/SOB, hx COVID infection in January, now with bilateral PE on CT.  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  2/22 night and 2/23 morning the patient has been experiencing epistaxis and the heparin was continued until the patient experienced life threatening bleeding 2/23 afternoon and a code blue was called. The nose was packed and cauterized by ENT at this time the patient was emergently intubated and brought to the ICU.   Noted new DVT and RV strain on echo. Heparin restarted 02/16/20.  Nasal packing continues  - but no further bleeding  Hep lvl now within gaoal  Cbc stable  Goal of Therapy:   Heparin level 0.3-0.5 units/ml Monitor platelets by anticoagulation protocol: Yes   Plan:  heparin 1000 units/hr Daily heparin level and CBC Monitor closely for any significant increase in bleeding  Barth Kirks, PharmD, BCPS, BCCCP Clinical Pharmacist [phone]  Please check AMION for all [place] numbers  02/23/2020 12:59 PM

## 2020-02-23 NOTE — Progress Notes (Signed)
Received report from [facility], Therapist, sports. Patient opens eyes to voice, gave thumbs up to command, in no distress. Shook head no when asked if in pain. NSR, HR in the 70s, SBP 120-100s. Intubated and sedated on 75 mcg/hr of fentanyl, 1 mg/hour of precedex, breathing comfortable. OGT in place with TF going at goal of Vital HP 65m/hr and q8hour flush of 2066m Foley in place for retention, getting lasix. 2 PIVs, one in LASiouxnd other in RAC. LUA midline. Heparin gtt continued. Will continue to monitor.

## 2020-02-23 NOTE — Progress Notes (Signed)
Subjective:    Patient ID: Jesus Russell, male    DOB: [birth year], 69 y.o.   MRN: [id number]  HPI Continues on ventilator having required reintubation and repacking two days ago.  No further bleeding.  Review of Systems     Objective:   Physical Exam AF VSS Sedated, intubated Rapid Rhino pack in left nasal passage     Assessment & Plan:  Epistaxis, anti-coagulation  With need to reintubate and repack, we will plan nasal endoscopy with potential cautery in operating room tomorrow afternoon.  Risks, benefits, and alternatives were discussed with his sister, Vicente Males, who gives consent.

## 2020-02-23 NOTE — Progress Notes (Signed)
NAME:  Jesus Russell, MRN:  [id number], DOB:  09/27/1951, LOS: 8 ADMISSION DATE:  02/14/2020, CONSULTATION DATE:  2/23  REFERRING MD:  Dr. Rebeca Alert CHIEF COMPLAINT: acute respiratory failure in setting of massive epistaxis    Brief History   69 year old male with history of recent COVID (01/06/2020) sarcoidosis, antiphospholipid antibody syndrome, prior PE and DVT, had been on Coumadin but had stopped this in order to take naproxen.  Admitted 2/21 with acute PE.  Developed worsening epistaxis and progressive respiratory failure requiring intubation on 2/23.  Past Medical History  Sarcoidosis, antiphospholipid ab syndrome. Recent PE.  Significant Hospital Events   2/21 admitted + PE 2/22 started to have epistaxis.  2/23 Epistaxis continued. Left nare packed. Intubation w/ FOB negative for airway obstruction. ECHO w/ new RV failure suggesting progression of PE. Had obstructive shock physiology. Heparin restarted. Supported on [employer]. + DVT BLEs.  2/24 Pressor requirements improving. Not ready to wean. New AKI. But bleeding seems to have stopped.  2/26 Nasal packing in place, no further bleeding  2/27 Nasal packing removed per ENT, initially no bleeding. Pt extubated.  2/28 Recurrent nose bleeding with respiratory distress requiring intubation, repeat nasal packing.   3/01 ENT called back to eval patient given recurrent epistaxis/bleeding   Consults:  ENT PCCM  Procedures:  ETT 2/23 >> 2/27  Left [place] TLC 2/23 >> 2/27?  Significant Diagnostic Tests:   2/21 CT angio chest: Positive for pulmonary emboli with combination of occlusive and nonocclusive embolus in the lower lobe lungs bilaterally from segmental to subsegmental vessel size  2/22 Echocardiogram: Demonstrated LVEF 60 to 96% grade 1 diastolic dysfunction RV severely enlarged tricuspid regurgitation RA was normal  2/23 [place] BLE >> Findings consistent with acute deep vein thrombosis involving the right common femoral vein, SF junction,  right femoral vein, right proximal profunda vein, right popliteal vein, right posterior tibial veins, and right gastrocnemius veins- Findings consistent with acute deep vein thrombosis involving the left posterior tibial veins.  2/23 ECHO >> Left ventricular ejection fraction, by estimation, is 50 to 55%. The  left ventricle has low normal function. The left ventricle has no regional  wall motion abnormalities.  The images are very technically difficult . The RV could only be seen using Definity contrast. The RV appears to be very dilated and with severely reduced RV function There is moderate pulmonary artery hypertension . Right ventricular systolic function is severely reduced. The right ventricular size is  severely enlarged. There is moderately elevated pulmonary artery systolic pressure.    Micro Data:  COVID 2/21 >> positive  Antimicrobials:  Unasyn 2/23 >>2/27 Unasyn 3/1 >>  Interim history/subjective:  RN reports no acute events overnight.  Afebrile.  Glucose range 95-120 I/O - 2.4L UOP, -412 ml for 24h On 20 mcg propofol, 150 mcg fentanyl / deeply sedate   Objective   Blood pressure 120/82, pulse 77, temperature (!) 97.5 F (36.4 C), resp. rate 14, height _0  (1.727 m), weight 100 kg, SpO2 95 %.    Vent Mode: PRVC FiO2 (%):  [30 %] 30 % Set Rate:  [14 bmp] 14 bmp Vt Set:  [560 mL] 560 mL PEEP:  [5 cmH20] 5 cmH20 Plateau Pressure:  [17 cmH20-22 cmH20] 17 cmH20   Intake/Output Summary (Last 24 hours) at 02/23/2020 0808 Last data filed at 02/23/2020 0600 Gross per 24 hour  Intake 1927.81 ml  Output 2350 ml  Net -422.19 ml   Filed Weights   02/21/20 0428 02/22/20 0420 02/23/20 0500  Weight: 103.4 kg 99 kg 100 kg    Examination: General: adult male lying in bed on vent in NAD   HEENT: MM pink/moist, ETT, nasal packing Neuro: sedate, no response to verbal/physical stimuli CV: s1s2 RRR, SR on monitor in 70's, no m/r/g PULM:  Non-labored, lungs bilaterally clear,  diminished bases  GI: soft, bsx4 active, gastric tube with TF infusing  Extremities: warm/dry, generalized edema / 2-3+ BLE pitting  edema  Skin: no rashes or lesions  CXR 3/2 >> images personally reviewed, ETT in good position, bilateral effusions, bilateral patchy opacities, L>R    Resolved Hospital Problem list   Circulatory Shock / Obstructive Shock 2/2 PE w/ Acute Right Ventricular Failure c/b underlying chronic PH- resolved Constipation: miralax PRN  Assessment & Plan:   Acute hypoxic respiratory failure, in setting of acute PE +/- aspiration event with epistaxis.  Small Bilateral Pleural Effusions Large clot on ETT when extubated 2/27.  Failed with recurrent nose bleed requiring re-packing & intubation 2/28 pm.  -PRVC 8cc/kg -wean PEEP / FiO2 for sats >90% -no extubation until no further epistaxis  -ok for PSV as tolerated -PAD protocol with fentanyl, change to precedex for RASS of 0 to -1  -lasix as below   Acute pulmonary embolus w/ bilateral acute DVT, livedo reticularis Antiphospholipid Antibody -continue heparin gtt per pharmacy   Epistaxis -continue nasal packing, D3/x  -appreciate ENT, reviewed with Dr. Redmond Baseman 3/1 -continue empiric Unasyn with nasal packing in place  AKI  Hypernatremia  Suspect ATN 2/2 decreased end-organ perfusion in shock state -add free water PT  -lasix 40 mg IV x1 with KCL -Trend BMP / urinary output -Replace electrolytes as indicated -Avoid nephrotoxic agents, ensure adequate renal perfusion  History of Sarcoidosis Recently tapered off steroids -continue bronchodilators  -empiric PJP coverage on hold (bactrim)  Subacute T8 fracture Was on chronic steroids / taper, reportedly off on admit  -supportive care -fentanyl for pain   At Risk Malnutrition  -TF per nutrition   Metabolic encephalopathy vs. Cognitive deficit Lives in facility at baseline - it is a drug / rehab program facility but stayed on and lives there.  Would  typically wake around noon, eat breakfast, read, watch TV. Slept a lot in the last few months, confusion noted at times. 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 Was private / didn't share medical info with family.  -supportive care -PT efforts  -limit sedating medications, see changes as above  -promote sleep / wake cycle   Hypoglycemia  -continue TF  Best practice:  Diet: Dysphagia Pain/Anxiety/Delirium protocol (if indicated): in place VAP protocol (if indicated): ordered DVT prophylaxis: heparin infusion GI prophylaxis: PTA PPI Glucose control: Not indicated Mobility: PT/OT Code Status: Full code Family Communication: Sister Vicente Males) called for update 3/2, no answer. Message left for return call.  Disposition: ICU   CC Time: 15 minutes    Noe Gens, MSN, NP-C [facility] 02/23/2020, 8:08 AM   Please see [email or web address] for pager details.

## 2020-02-23 NOTE — Progress Notes (Signed)
ANTICOAGULATION CONSULT NOTE  Pharmacy Consult for heparin Indication: pulmonary embolus  No Known Allergies  Patient Measurements: Height: 5' 8" (172.7 cm) Weight: 218 lb 4.1 oz (99 kg) IBW/kg (Calculated) : 68.4 Heparin Dosing Weight: 94 kg  Vital Signs: BP: 122/80 (03/02 0319) Pulse Rate: 81 (03/02 0319)  Labs: Recent Labs    02/20/20 0511 02/21/20 0251 02/21/20 0939 02/21/20 1627 02/21/20 1845 02/22/20 0606 02/22/20 1229 02/22/20 1543 02/23/20 0255  HGB   < > 9.1*  --    < > 9.5* 9.1*  --   --  8.5*  HCT   < > 30.4*  --   --  28.0* 28.5*  --   --  28.1*  PLT  --  202  --   --   --  207  --   --  276  LABPROT  --  14.1  --   --   --  14.4  --   --  14.6  INR  --  1.1  --   --   --  1.1  --   --  1.2  HEPARINUNFRC  --  0.30  --    < >  --  0.43 0.39  --  0.29*  CREATININE   < >  --  1.45*  --   --   --   --  1.27* 1.28*   < > = values in this interval not displayed.    Estimated Creatinine Clearance: 64.7 mL/min (A) (by C-G formula based on SCr of 1.28 mg/dL (H)).   Assessment: 69 yr old male presented with abdominal pain/SOB, hx COVID infection in January, now with bilateral PE on CT.  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  2/22 night and 2/23 morning the patient has been experiencing epistaxis and the heparin was continued until the patient experienced life threatening bleeding 2/23 afternoon and a code blue was called. The nose was packed and cauterized by ENT at this time the patient was emergently intubated and brought to the ICU.   Noted new DVT and RV strain on echo. Heparin restarted 02/16/20.  Nasal packing removed by ENT yesterday and was extubated>> repacked this evening. Heparin level this evening came back at 0.27, on 900 units/hr. Hgb 9.1 (stable), plt 202 (trending up). Continues to have nose bleeding per RN - will monitor closely.   3/2 AM update:  Heparin  level just below goal No issues per RN  Goal of Therapy:  Heparin level 0.3-0.5 units/ml Monitor platelets by anticoagulation protocol: Yes   Plan:  Inc heparin slightly to 1000 units/hr Re-check heparin level at 1200 Daily heparin level and CBC Monitor closely for any significant increase in bleeding  Narda Bonds, PharmD, [place] Pharmacist Phone: [phone]

## 2020-02-24 ENCOUNTER — Encounter (HOSPITAL_COMMUNITY): Payer: Self-pay | Admitting: Internal Medicine

## 2020-02-24 ENCOUNTER — Inpatient Hospital Stay (HOSPITAL_COMMUNITY): Payer: Medicare Other | Admitting: Certified Registered Nurse Anesthetist

## 2020-02-24 ENCOUNTER — Encounter (HOSPITAL_COMMUNITY)
Admission: EM | Disposition: A | Discharge status: Nursing Home (Includes ICF) | Payer: Self-pay | Source: Home / Self Care | Attending: Pulmonary Disease

## 2020-02-24 ENCOUNTER — Inpatient Hospital Stay (HOSPITAL_COMMUNITY): Payer: Medicare Other

## 2020-02-24 HISTORY — PX: NASAL ENDOSCOPY WITH EPISTAXIS CONTROL: SHX5664

## 2020-02-24 LAB — CBC
HCT: 27.7 % — ABNORMAL LOW (ref 39.0–52.0)
Hemoglobin: 8.3 g/dL — ABNORMAL LOW (ref 13.0–17.0)
MCH: 33.3 pg (ref 26.0–34.0)
MCHC: 30 g/dL (ref 30.0–36.0)
MCV: 111.2 fL — ABNORMAL HIGH (ref 80.0–100.0)
Platelets: 286 10*3/uL (ref 150–400)
RBC: 2.49 MIL/uL — ABNORMAL LOW (ref 4.22–5.81)
RDW: 16.1 % — ABNORMAL HIGH (ref 11.5–15.5)
WBC: 12.6 10*3/uL — ABNORMAL HIGH (ref 4.0–10.5)
nRBC: 0.5 % — ABNORMAL HIGH (ref 0.0–0.2)

## 2020-02-24 LAB — BASIC METABOLIC PANEL
Anion gap: 8 (ref 5–15)
BUN: 56 mg/dL — ABNORMAL HIGH (ref 8–23)
CO2: 37 mmol/L — ABNORMAL HIGH (ref 22–32)
Calcium: 8.8 mg/dL — ABNORMAL LOW (ref 8.9–10.3)
Chloride: 104 mmol/L (ref 98–111)
Creatinine, Ser: 1.18 mg/dL (ref 0.61–1.24)
GFR calc Af Amer: >60 mL/min (ref >60)
GFR calc non Af Amer: >60 mL/min (ref >60)
Glucose, Bld: 99 mg/dL (ref 70–99)
Potassium: 3.8 mmol/L (ref 3.5–5.1)
Sodium: 149 mmol/L — ABNORMAL HIGH (ref 135–145)

## 2020-02-24 LAB — GLUCOSE, CAPILLARY
Glucose-Capillary: 84 mg/dL (ref 70–99)
Glucose-Capillary: 91 mg/dL (ref 70–99)
Glucose-Capillary: 102 mg/dL — ABNORMAL HIGH (ref 70–99)
Glucose-Capillary: 105 mg/dL — ABNORMAL HIGH (ref 70–99)
Glucose-Capillary: 116 mg/dL — ABNORMAL HIGH (ref 70–99)
Glucose-Capillary: 107 mg/dL — ABNORMAL HIGH (ref 70–99)
Glucose-Capillary: 130 mg/dL — ABNORMAL HIGH (ref 70–99)

## 2020-02-24 LAB — PROTIME-INR
INR: 1.2 (ref 0.8–1.2)
Prothrombin Time: 14.7 seconds (ref 11.4–15.2)

## 2020-02-24 LAB — HEPARIN LEVEL (UNFRACTIONATED): Heparin Unfractionated: 0.42 IU/mL (ref 0.30–0.70)

## 2020-02-24 SURGERY — CONTROL OF EPISTAXIS, ENDOSCOPIC
Anesthesia: General | Site: Nose

## 2020-02-24 MED ORDER — PROPOFOL 10 MG/ML IV BOLUS
INTRAVENOUS | Status: DC | PRN
  Administered 2020-02-24: 20 mg via INTRAVENOUS
  Administered 2020-02-24 (×2): 30 mg via INTRAVENOUS

## 2020-02-24 MED ORDER — FENTANYL CITRATE (PF) 250 MCG/5ML IJ SOLN
INTRAMUSCULAR | Status: AC
  Filled 2020-02-24: qty 5

## 2020-02-24 MED ORDER — OXYMETAZOLINE HCL 0.05 % NA SOLN
NASAL | Status: DC | PRN
  Administered 2020-02-24: 1

## 2020-02-24 MED ORDER — PHENYLEPHRINE 40 MCG/ML (10ML) SYRINGE FOR IV PUSH (FOR BLOOD PRESSURE SUPPORT)
PREFILLED_SYRINGE | INTRAVENOUS | Status: DC | PRN
  Administered 2020-02-24 (×2): 80 ug via INTRAVENOUS

## 2020-02-24 MED ORDER — PROPOFOL 10 MG/ML IV BOLUS
INTRAVENOUS | Status: AC
  Filled 2020-02-24: qty 20

## 2020-02-24 MED ORDER — OXYMETAZOLINE HCL 0.05 % NA SOLN
NASAL | Status: AC
  Filled 2020-02-24: qty 30

## 2020-02-24 MED ORDER — DEXAMETHASONE SODIUM PHOSPHATE 10 MG/ML IJ SOLN
INTRAMUSCULAR | Status: DC | PRN
  Administered 2020-02-24: 4 mg via INTRAVENOUS

## 2020-02-24 MED ORDER — MIDAZOLAM HCL 5 MG/5ML IJ SOLN
INTRAMUSCULAR | Status: DC | PRN
  Administered 2020-02-24: 2 mg via INTRAVENOUS

## 2020-02-24 MED ORDER — ROCURONIUM BROMIDE 100 MG/10ML IV SOLN
INTRAVENOUS | Status: DC | PRN
  Administered 2020-02-24: 40 mg via INTRAVENOUS
  Administered 2020-02-24: 20 mg via INTRAVENOUS

## 2020-02-24 MED ORDER — FREE WATER
200.0000 mL | Freq: Four times a day (QID) | Status: DC
  Administered 2020-02-24 – 2020-02-25 (×2): 200 mL

## 2020-02-24 MED ORDER — HEMOSTATIC AGENTS (NO CHARGE) OPTIME
TOPICAL | Status: DC | PRN
  Administered 2020-02-24: 1 via TOPICAL

## 2020-02-24 MED ORDER — ONDANSETRON HCL 4 MG/2ML IJ SOLN
INTRAMUSCULAR | Status: DC | PRN
  Administered 2020-02-24: 4 mg via INTRAVENOUS

## 2020-02-24 MED ORDER — PHENYLEPHRINE 40 MCG/ML (10ML) SYRINGE FOR IV PUSH (FOR BLOOD PRESSURE SUPPORT)
PREFILLED_SYRINGE | INTRAVENOUS | Status: AC
  Filled 2020-02-24: qty 10

## 2020-02-24 MED ORDER — MIDAZOLAM HCL 2 MG/2ML IJ SOLN
INTRAMUSCULAR | Status: AC
  Filled 2020-02-24: qty 2

## 2020-02-24 MED ORDER — EPHEDRINE SULFATE-NACL 50-0.9 MG/10ML-% IV SOSY
PREFILLED_SYRINGE | INTRAVENOUS | Status: DC | PRN
  Administered 2020-02-24 (×2): 5 mg via INTRAVENOUS
  Administered 2020-02-24: 10 mg via INTRAVENOUS
  Administered 2020-02-24: 5 mg via INTRAVENOUS

## 2020-02-24 MED ORDER — 0.9 % SODIUM CHLORIDE (POUR BTL) OPTIME
TOPICAL | Status: DC | PRN
  Administered 2020-02-24: 14:00:00 1000 mL

## 2020-02-24 MED ORDER — EPHEDRINE 5 MG/ML INJ
INTRAVENOUS | Status: AC
  Filled 2020-02-24: qty 10

## 2020-02-24 MED ORDER — LACTATED RINGERS IV SOLN: INTRAVENOUS | Status: DC | PRN

## 2020-02-24 MED ORDER — LIDOCAINE-EPINEPHRINE 1 %-1:100000 IJ SOLN
INTRAMUSCULAR | Status: AC
  Filled 2020-02-24: qty 1

## 2020-02-24 SURGICAL SUPPLY — 26 items
COAGULATOR SUCT 6 FR SWTCH (ELECTROSURGICAL) ×1
COAGULATOR SUCT SWTCH 10FR 6 (ELECTROSURGICAL) ×2 IMPLANT
COVER MAYO STAND STRL (DRAPES) IMPLANT
COVER WAND RF STERILE (DRAPES) IMPLANT
DECANTER SPIKE VIAL GLASS SM (MISCELLANEOUS) IMPLANT
DRESSING NASAL POPE 10X1.5X2.5 (GAUZE/BANDAGES/DRESSINGS) ×1 IMPLANT
DRSG NASAL POPE 10X1.5X2.5 (GAUZE/BANDAGES/DRESSINGS) ×3
DRSG NASOPORE 8CM (GAUZE/BANDAGES/DRESSINGS) IMPLANT
ELECT COATED BLADE 2.86 ST (ELECTRODE) IMPLANT
ELECT REM PT RETURN 9FT ADLT (ELECTROSURGICAL)
ELECTRODE REM PT RTRN 9FT ADLT (ELECTROSURGICAL) IMPLANT
GAUZE 4X4 16PLY RFD (DISPOSABLE) ×3 IMPLANT
GAUZE SPONGE 2X2 8PLY STRL LF (GAUZE/BANDAGES/DRESSINGS) IMPLANT
GLOVE ECLIPSE 7.5 STRL STRAW (GLOVE) ×3 IMPLANT
GOWN STRL REUS W/ TWL LRG LVL3 (GOWN DISPOSABLE) IMPLANT
GOWN STRL REUS W/TWL LRG LVL3 (GOWN DISPOSABLE)
HEMOSTAT SURGICEL 2X4 FIBR (HEMOSTASIS) ×3 IMPLANT
KIT BASIN OR (CUSTOM PROCEDURE TRAY) IMPLANT
PATTIES SURGICAL .5 X3 (DISPOSABLE) IMPLANT
PENCIL BUTTON HOLSTER BLD 10FT (ELECTRODE) IMPLANT
SOLUTION BUTLER CLEAR DIP (MISCELLANEOUS) IMPLANT
SPONGE GAUZE 2X2 STER 10/PKG (GAUZE/BANDAGES/DRESSINGS)
SURGIFLO W/THROMBIN 8M KIT (HEMOSTASIS) ×3 IMPLANT
TOWEL GREEN STERILE (TOWEL DISPOSABLE) IMPLANT
TRAY ENT MC OR (CUSTOM PROCEDURE TRAY) ×3 IMPLANT
YANKAUER SUCT BULB TIP NO VENT (SUCTIONS) IMPLANT

## 2020-02-24 NOTE — Progress Notes (Signed)
OT Cancellation Note  Patient Details Name: Jesus Russell MRN: [id number] DOB: February 10, 1951   Cancelled Treatment:    Reason Eval/Treat Not Completed: Patient at procedure or test/ unavailable(Pt in OR)  Jesus Russell, Jesus Russell 02/24/2020, 1:23 PM  [name], OTR/L Acute Rehabilitation Services Pager: [phone] Office: [phone]

## 2020-02-24 NOTE — Transfer of Care (Signed)
Immediate Anesthesia Transfer of Care Note  Patient: Carols Clemence  Procedure(s) Performed: NASAL ENDOSCOPY WITH EPISTAXIS CONTROL (N/A Nose)  Patient Location: ICU  Anesthesia Type:General  Level of Consciousness: Patient remains intubated per anesthesia plan  Airway & Oxygen Therapy: Patient placed on Ventilator (see vital sign flow sheet for setting)  Post-op Assessment: Report given to RN and Post -op Vital signs reviewed and stable  Post vital signs: Reviewed  Last Vitals:  Vitals Value Taken Time  BP 103/69 02/24/20 1430  Temp    Pulse 54 02/24/20 1433  Resp 14 02/24/20 1433  SpO2 100 % 02/24/20 1433  Vitals shown include unvalidated device data.  Last Pain:  Vitals:   02/24/20 1100  TempSrc: Oral  PainSc:          Complications: No apparent anesthesia complications

## 2020-02-24 NOTE — Anesthesia Postprocedure Evaluation (Signed)
Anesthesia Post Note  Patient: Jesus Russell  Procedure(s) Performed: NASAL ENDOSCOPY WITH EPISTAXIS CONTROL (N/A Nose)     Patient location during evaluation: SICU Anesthesia Type: General Level of consciousness: patient remains intubated per anesthesia plan Pain management: pain level controlled Vital Signs Assessment: post-procedure vital signs reviewed and stable Respiratory status: patient remains intubated per anesthesia plan and patient on ventilator - see flowsheet for VS Cardiovascular status: stable and blood pressure returned to baseline Postop Assessment: no apparent nausea or vomiting Anesthetic complications: no                  Asmi Fugere A.

## 2020-02-24 NOTE — Progress Notes (Signed)
PT Cancellation Note  Patient Details Name: Jesus Russell MRN: [id number] DOB: [birth year]   Cancelled Treatment:    Reason Eval/Treat Not Completed: Patient at procedure or test/unavailable  Pt to OR for nasal cauterization.  Will follow up as able.  Maggie Font, PT Acute Rehab Services Pager [phone] [facility] [phone]    Karlton Lemon 02/24/2020, 2:15 PM

## 2020-02-24 NOTE — Progress Notes (Signed)
ANTICOAGULATION CONSULT NOTE  Pharmacy Consult for heparin Indication: pulmonary embolus  No Known Allergies  Patient Measurements: Height: _0  (172.7 cm) Weight: 218 lb 0.6 oz (98.9 kg) IBW/kg (Calculated) : 68.4 Heparin Dosing Weight: 94 kg  Vital Signs: Temp: 98.6 F (37 C) (03/03 0748) Temp Source: Oral (03/03 0748) BP: 120/81 (03/03 0801) Pulse Rate: 77 (03/03 0801)  Labs: Recent Labs    02/22/20 0606 02/22/20 1229 02/22/20 1543 02/23/20 0255 02/23/20 1200 02/24/20 0301  HGB 9.1*  --   --  8.5*  --  8.3*  HCT 28.5*  --   --  28.1*  --  27.7*  PLT 207  --   --  276  --  286  LABPROT 14.4  --   --  14.6  --  14.7  INR 1.1  --   --  1.2  --  1.2  HEPARINUNFRC 0.43   < >  --  0.29* 0.44 0.42  CREATININE  --   --  1.27* 1.28*  --  1.18   < > = values in this interval not displayed.    Estimated Creatinine Clearance: 70.2 mL/min (by C-G formula based on SCr of 1.18 mg/dL).   Assessment: 69 yr old male presented with abdominal pain/SOB, hx COVID infection in January, now with bilateral PE on CT.  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  2/22 night and 2/23 morning the patient has been experiencing epistaxis and the heparin was continued until the patient experienced life threatening bleeding 2/23 afternoon and a code blue was called. The nose was packed and cauterized by ENT at this time the patient was emergently intubated and brought to the ICU.   Noted new DVT and RV strain on echo. Heparin restarted 02/16/20.  Nasal packing continues  - but no further bleeding  Hep lvl now within goal  Cbc stable  Goal of Therapy:  Heparin level 0.3-0.5 units/ml Monitor platelets by anticoagulation protocol: Yes   Plan:  heparin 1000 units/hr Daily heparin level and CBC Monitor closely for any significant increase in bleeding  Barth Kirks, PharmD, BCPS, BCCCP Clinical  Pharmacist [phone]  Please check AMION for all [place] numbers  02/24/2020 9:27 AM

## 2020-02-24 NOTE — Brief Op Note (Signed)
02/24/2020  2:17 PM  PATIENT:  Jesus Russell  69 y.o. male  PRE-OPERATIVE DIAGNOSIS:  Epistaxis  POST-OPERATIVE DIAGNOSIS:  same  PROCEDURE:  Procedure(s): NASAL ENDOSCOPY WITH EPISTAXIS CONTROL (N/A)  SURGEON:  Surgeon(s) and Role:    * [name], MD - Primary  PHYSICIAN ASSISTANT:   ASSISTANTS: none   ANESTHESIA:   general  EBL:  20 mL   BLOOD ADMINISTERED:none  DRAINS: none   LOCAL MEDICATIONS USED:  NONE  SPECIMEN:  No Specimen  DISPOSITION OF SPECIMEN:  N/A  COUNTS:  YES  TOURNIQUET:  * No tourniquets in log *  DICTATION: .Other Dictation: Dictation Number [id number]  PLAN OF CARE: Return to ICU  PATIENT DISPOSITION:  ICU - intubated and hemodynamically stable.   Delay start of Pharmacological VTE agent (>24hrs) due to surgical blood loss or risk of bleeding: no

## 2020-02-24 NOTE — Anesthesia Preprocedure Evaluation (Addendum)
Anesthesia Evaluation  Patient identified by MRN, date of birth, ID band  Reviewed: Allergy & Precautions, NPO status , Patient's Chart, lab work & pertinent test results  Airway Mallampati: Intubated       Dental  (+) Teeth Intact   Pulmonary sleep apnea and Continuous Positive Airway Pressure Ventilation , former smoker, PE Epistaxis PTE 2/21 Covid + on 2/21 Sarcoidosis- Steroid dependent   + rhonchi        Cardiovascular hypertension, Pt. on medications + DVT  Normal cardiovascular exam Rhythm:Regular Rate:Normal  Hx/o IVC filter placement  Echo 02/16/20 1. Left ventricular ejection fraction, by estimation, is 50 to 55%. The left ventricle has low normal function. The left ventricle has no regional wall motion abnormalities.  2. The images are very technically difficult . The RV could only be seen using Definity contrast. The RV appears to be very dilated and with severely reduced RV function  There is moderate pulmonary artery hypertension . Right ventricular systolic function is severely reduced. The right ventricular size is severely enlarged. There is moderately elevated pulmonary artery systolic pressure.    EKG 02/14/20- NSR, RBBB pattern   Neuro/Psych  Neuromuscular disease negative psych ROS   GI/Hepatic negative GI ROS, Neg liver ROS,   Endo/Other  Obesity  Renal/GU negative Renal ROS  negative genitourinary   Musculoskeletal negative musculoskeletal ROS (+)   Abdominal (+) + obese,   Peds  Hematology  (+) anemia , Hx/o warfarin therapy currently on Heparin bridge Antiphospholipid Ab syndrome   Anesthesia Other Findings   Reproductive/Obstetrics                         Anesthesia Physical Anesthesia Plan  ASA: III  Anesthesia Plan: General   Post-op Pain Management:    Induction: Intravenous  PONV Risk Score and Plan: 4 or greater and Ondansetron, Dexamethasone and  Treatment may vary due to age or medical condition  Airway Management Planned: Oral ETT  Additional Equipment:   Intra-op Plan:   Post-operative Plan: Post-operative intubation/ventilation  Informed Consent: I have reviewed the patients History and Physical, chart, labs and discussed the procedure including the risks, benefits and alternatives for the proposed anesthesia with the patient or authorized representative who has indicated his/her understanding and acceptance.       Plan Discussed with: CRNA and Surgeon  Anesthesia Plan Comments:         Anesthesia Quick Evaluation

## 2020-02-24 NOTE — Progress Notes (Signed)
NAME:  Jesus Russell, MRN:  [id number], DOB:  30-Jan-1951, LOS: 9 ADMISSION DATE:  02/14/2020, CONSULTATION DATE:  2/23  REFERRING MD:  Dr. Rebeca Alert CHIEF COMPLAINT: acute respiratory failure in setting of massive epistaxis    Brief History   69 year old male with history of recent COVID (01/06/2020) sarcoidosis, antiphospholipid antibody syndrome, prior PE and DVT, had been on Coumadin but had stopped this in order to take naproxen.  Admitted 2/21 with acute PE.  Developed worsening epistaxis and progressive respiratory failure requiring intubation on 2/23.  Past Medical History  Sarcoidosis, antiphospholipid ab syndrome. Recent PE.  Significant Hospital Events   2/21 admitted + PE 2/22 started to have epistaxis.  2/23 Epistaxis continued. Left nare packed. Intubation w/ FOB negative for airway obstruction. ECHO w/ new RV failure suggesting progression of PE. Had obstructive shock physiology. Heparin restarted. Supported on [employer]. + DVT BLEs.  2/24 Pressor requirements improving. Not ready to wean. New AKI. But bleeding seems to have stopped.  2/26 Nasal packing in place, no further bleeding  2/27 Nasal packing removed per ENT, initially no bleeding. Pt extubated.  2/28 Recurrent nose bleeding with respiratory distress requiring intubation, repeat nasal packing.   3/01 ENT called back to eval patient given recurrent epistaxis/bleeding  3/03 Pending OR for nasal cautery  Consults:  ENT PCCM  Procedures:  ETT 2/23 >> 2/27  Left [place] TLC 2/23 >> 2/27?  Significant Diagnostic Tests:   2/21 CT angio chest: Positive for pulmonary emboli with combination of occlusive and nonocclusive embolus in the lower lobe lungs bilaterally from segmental to subsegmental vessel size  2/22 Echocardiogram: Demonstrated LVEF 60 to 03% grade 1 diastolic dysfunction RV severely enlarged tricuspid regurgitation RA was normal  2/23 [place] BLE >> Findings consistent with acute deep vein thrombosis involving the right  common femoral vein, SF junction, right femoral vein, right proximal profunda vein, right popliteal vein, right posterior tibial veins, and right gastrocnemius veins- Findings consistent with acute deep vein thrombosis involving the left posterior tibial veins.  2/23 ECHO >> Left ventricular ejection fraction, by estimation, is 50 to 55%. The  left ventricle has low normal function. The left ventricle has no regional  wall motion abnormalities.  The images are very technically difficult . The RV could only be seen using Definity contrast. The RV appears to be very dilated and with severely reduced RV function There is moderate pulmonary artery hypertension . Right ventricular systolic function is severely reduced. The right ventricular size is  severely enlarged. There is moderately elevated pulmonary artery systolic pressure.    Micro Data:  COVID 2/21 >> positive  Antimicrobials:  Unasyn 2/23 >>2/27 Unasyn 3/1 >>  Interim history/subjective:  RN reports pt following commands, no acute events overngiht  I/O - 2.6L UOP, +217m for 24h. Net 1.1L negative for admit.   Afebrile  Glucose range 84-113  Objective   Blood pressure 120/81, pulse 77, temperature 98.6 F (37 C), temperature source Oral, resp. rate 14, height _0  (1.727 m), weight 98.9 kg, SpO2 98 %.    Vent Mode: PRVC FiO2 (%):  [30 %] 30 % Set Rate:  [14 bmp] 14 bmp Vt Set:  [560 mL] 560 mL PEEP:  [5 cmH20] 5 cmH20 Plateau Pressure:  [14 cmH20-20 cmH20] 19 cmH20   Intake/Output Summary (Last 24 hours) at 02/24/2020 0923 Last data filed at 02/24/2020 0800 Gross per 24 hour  Intake 2515.08 ml  Output 2640 ml  Net -124.92 ml   FA[employer]oliv  02/22/20 0420 02/23/20 0500 02/24/20 0441  Weight: 99 kg 100 kg 98.9 kg    Examination: General: adult male lying in bed on vent in NAD  HEENT: MM pink/moist, ETT, nasal packing in place on left Neuro: eyes open, alert, nods yes/no, follows commands CV: s1s2 RRR, SR 80's on  monitor, no m/r/g PULM:  Non-labored on vent, lungs bilaterally clear anterior GI: soft, bsx4 active  Extremities: warm/dry, 2-3+ BLE pitting edema  Skin: no rashes or lesions  CXR 3/3 >> images personally reviewed, ETT in good position, patchy interstitial opacities    Resolved Hospital Problem list   Circulatory Shock / Obstructive Shock 2/2 PE w/ Acute Right Ventricular Failure c/b underlying chronic PH- resolved Constipation: miralax PRN  Assessment & Plan:   Acute hypoxic respiratory failure, in setting of acute PE +/- aspiration event with epistaxis.  Small Bilateral Pleural Effusions Large clot on ETT when extubated 2/27.  Failed with recurrent nose bleed requiring re-packing & intubation 2/28 pm.  -PRVC 8cc/kg  -wean PEEP / FiO2 for sats 90% -pending OR evaluation, possible cautery  -SBT / PSV trials but hold extubation until OR / ENT evaluation completed -PAD protocol with fentanyl + precedex for RASS Goal of 0 to -1  -hold lasix 3/3, consider additional lasix + acetazolamide 3/4   Acute pulmonary embolus w/ bilateral acute DVT, livedo reticularis Antiphospholipid Antibody -heparin gtt per pharmacy   Epistaxis -continue nasal packing, D4/x  -await OR findings, appreciate ENT  -continue empiric unasyn with nasal packing in place  AKI  Hypernatremia  Suspect ATN 2/2 decreased end-organ perfusion in shock state -continue free water -hold lasix 3/3 -Trend BMP / urinary output -Replace electrolytes as indicated -Avoid nephrotoxic agents, ensure adequate renal perfusion  History of Sarcoidosis Recently tapered off steroids -continue bronchodilators -empiric PJP coverage on hold (bactrim)   Subacute T8 fracture Was on chronic steroids / taper, reportedly off on admit  -supportive care, fentanyl for pain   At Risk Malnutrition  -TF per Nutrition    Metabolic encephalopathy vs. Baseline Cognitive Deficit Lives in facility at baseline - it is a drug / rehab  program facility but stayed on and lives there.  Would typically wake around noon, eat breakfast, read, watch TV. Slept a lot in the last few months, confusion noted at times. 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 Was private / didn't share medical info with family.  -supportive care -PT efforts  -limit sedating medications -promote sleep / wake cycle   Hypoglycemia  -continue TF  Best practice:  Diet: Dysphagia Pain/Anxiety/Delirium protocol (if indicated): in place VAP protocol (if indicated): ordered DVT prophylaxis: heparin infusion GI prophylaxis: PTA PPI Glucose control: Not indicated Mobility: PT/OT Code Status: Full code Family Communication: Sister Vicente Males) called for update 3/3.  Plan of care reviewed.   Disposition: ICU   CC Time: 71 minutes    Noe Gens, MSN, NP-C [facility] 02/24/2020, 9:23 AM   Please see [email or web address] for pager details.

## 2020-02-24 NOTE — Op Note (Signed)
Jesus Russell, [facility] MEDICAL RECORD [id number] ACCOUNT [id number] DATE OF BIRTH:07-27-1951 FACILITY: [facility] LOCATION: [facility] PHYSICIAN:Jonuel DRedmond Baseman, MD  OPERATIVE REPORT  DATE OF PROCEDURE:  02/24/2020  PREOPERATIVE DIAGNOSIS:  Epistaxis and anticoagulation.  POSTOPERATIVE DIAGNOSIS:  Epistaxis and anticoagulation.  PROCEDURE:  Nasal endoscopy for control of epistaxis.  SURGEON:  [name], MD  ANESTHESIA:  General endotracheal anesthesia.  COMPLICATIONS:  None.  INDICATIONS:  The patient is a 69 year old male who presented to the hospital with a pulmonary embolus and has required heparin therapy.  This has resulted in recurrent left epistaxis that has required packing now twice and also has resulted in him  having to be intubated twice.  Packing the first time controlled the bleeding while in place, but he bled again the day after the packing was removed.  He presents to the operating room for surgical management.  FINDINGS:  The nasal pack was removed from the left side of the nose and the nose was carefully examined internally with the endoscope.  There was abrasion related to the packing along the septum and inferior turbinate and that was conservatively  cauterized.  There was no other obvious source of bleeding identified.  The mucosal area over the sphenopalatine artery was specifically cauterized fairly aggressively.  The previous site of chemical cautery on the septum was evident and additional  cautery was performed there as well.  DESCRIPTION OF PROCEDURE:  The patient was identified in the intensive care unit and informed consent having been obtained, including discussion of risks, the benefits, and alternatives, the patient was brought to the operative suite and put on the  operative table in supine position.  Anesthesia was continued via endotracheal anesthesia.  The eyes were taped closed and the face was draped.  The nasal pack was removed from the left side of  the nose and the 0 degree telescope was then used to  evaluate the nasal passage carefully.  A 30-degree and a 70-degree telescope were also used to look at the various parts of the nasal passage.  The granulated surfaces of the septum and inferior turbinate related to pack placement were bleeding slightly  and were conservatively cauterized with suction cautery on a setting of 20.  With additional evaluation failing to demonstrate any bleeding, including trying to elevate his blood pressure carefully pharmaceutically, decision was made to cauterize the  sphenopalatine region and this was done with suction cautery also still on a setting of 20 with fairly aggressive cautery at that location.  After this was completed, Surgiflo was mixed and was then placed over the various surfaces that were cauterized  and pressed into place using a pledget.  The nasal passage was then gently suctioned and he was then returned to anesthesia for transport back to intensive care unit in stable condition.  VN/NUANCE  D:02/24/2020 T:02/24/2020 JOB:[id number]

## 2020-02-25 ENCOUNTER — Encounter: Payer: Self-pay | Admitting: *Deleted

## 2020-02-25 LAB — CBC
HCT: 27.7 % — ABNORMAL LOW (ref 39.0–52.0)
Hemoglobin: 8 g/dL — ABNORMAL LOW (ref 13.0–17.0)
MCH: 32.8 pg (ref 26.0–34.0)
MCHC: 28.9 g/dL — ABNORMAL LOW (ref 30.0–36.0)
MCV: 113.5 fL — ABNORMAL HIGH (ref 80.0–100.0)
Platelets: 301 10*3/uL (ref 150–400)
RBC: 2.44 MIL/uL — ABNORMAL LOW (ref 4.22–5.81)
RDW: 16.3 % — ABNORMAL HIGH (ref 11.5–15.5)
WBC: 11 10*3/uL — ABNORMAL HIGH (ref 4.0–10.5)
nRBC: 0.4 % — ABNORMAL HIGH (ref 0.0–0.2)

## 2020-02-25 LAB — GLUCOSE, CAPILLARY
Glucose-Capillary: 105 mg/dL — ABNORMAL HIGH (ref 70–99)
Glucose-Capillary: 120 mg/dL — ABNORMAL HIGH (ref 70–99)
Glucose-Capillary: 121 mg/dL — ABNORMAL HIGH (ref 70–99)
Glucose-Capillary: 103 mg/dL — ABNORMAL HIGH (ref 70–99)
Glucose-Capillary: 91 mg/dL (ref 70–99)
Glucose-Capillary: 83 mg/dL (ref 70–99)

## 2020-02-25 LAB — PROTIME-INR
INR: 1.3 — ABNORMAL HIGH (ref 0.8–1.2)
Prothrombin Time: 15.6 seconds — ABNORMAL HIGH (ref 11.4–15.2)

## 2020-02-25 LAB — BASIC METABOLIC PANEL
Anion gap: 6 (ref 5–15)
BUN: 49 mg/dL — ABNORMAL HIGH (ref 8–23)
CO2: 38 mmol/L — ABNORMAL HIGH (ref 22–32)
Calcium: 8.6 mg/dL — ABNORMAL LOW (ref 8.9–10.3)
Chloride: 109 mmol/L (ref 98–111)
Creatinine, Ser: 1.38 mg/dL — ABNORMAL HIGH (ref 0.61–1.24)
GFR calc Af Amer: >60 mL/min (ref >60)
GFR calc non Af Amer: 53 mL/min — ABNORMAL LOW (ref >60)
Glucose, Bld: 122 mg/dL — ABNORMAL HIGH (ref 70–99)
Potassium: 4.1 mmol/L (ref 3.5–5.1)
Sodium: 153 mmol/L — ABNORMAL HIGH (ref 135–145)

## 2020-02-25 LAB — HEPARIN LEVEL (UNFRACTIONATED): Heparin Unfractionated: 0.45 IU/mL (ref 0.30–0.70)

## 2020-02-25 LAB — MRSA PCR SCREENING: MRSA by PCR: NEGATIVE

## 2020-02-25 MED ORDER — ORAL CARE MOUTH RINSE
15.0000 mL | Freq: Two times a day (BID) | OROMUCOSAL | Status: DC
  Administered 2020-02-25 (×2): 15 mL via OROMUCOSAL

## 2020-02-25 MED ORDER — POTASSIUM CHLORIDE 20 MEQ/15ML (10%) PO SOLN
40.0000 meq | Freq: Once | ORAL | Status: AC
  Administered 2020-02-25: 11:00:00 40 meq via ORAL
  Filled 2020-02-25: qty 30

## 2020-02-25 MED ORDER — ACETAZOLAMIDE 250 MG PO TABS
500.0000 mg | ORAL_TABLET | Freq: Once | ORAL | Status: AC
  Administered 2020-02-25: 500 mg
  Filled 2020-02-25: qty 2

## 2020-02-25 MED ORDER — FUROSEMIDE 10 MG/ML IJ SOLN
40.0000 mg | Freq: Once | INTRAMUSCULAR | Status: AC
  Administered 2020-02-25: 40 mg via INTRAVENOUS
  Filled 2020-02-25: qty 4

## 2020-02-25 MED ORDER — FREE WATER
300.0000 mL | Status: DC
  Administered 2020-02-25: 300 mL

## 2020-02-25 NOTE — Evaluation (Signed)
Occupational Therapy ReEvaluation Patient Details Name: Jesus Russell MRN: [id number] DOB: [birth year] Today's Date: 02/25/2020    History of Present Illness 69 yo admitted 02/15/20 with abdominal pain with acute PE 2/21, 2/22 epistaxis, pt intubated 2/23-2/27. Returned to OT on 02/24/20 due to nasal bleeding. PMhx: Pt seen by P.T. 01/25/20 due to T6 compression fx, Lupus, covid 1/13, HTN, sarcoidosis   Clinical Impression   Pt reevaluated s/p significant change in medical status. Continues to be intubated, but not sedated. Generally weak, requiring total assist for ADL and min to +2 min assist for mobility. Pt following commands well. Pt likely to be extubated today. Recommending CIR for further rehab.     Follow Up Recommendations  CIR;Supervision/Assistance - 24 hour    Equipment Recommendations  Other (comment)(defer to next venue)    Recommendations for Other Services       Precautions / Restrictions Precautions Precautions: Fall Precaution Comments: ETT, feeding tube, no blowing nose Restrictions Weight Bearing Restrictions: No      Mobility Bed Mobility Overal bed mobility: Needs Assistance Bed Mobility: Supine to Sit;Sit to Supine;Rolling Rolling: Min assist   Supine to sit: Min assist Sit to supine: Mod assist;+2 for physical assistance   General bed mobility comments: verbal cues for to bend knees up and reach for rail/therapist's hand, min physical assist to raise trunk, positioned hips at EOB without assist, assisted LEs back into bed  Transfers Overall transfer level: Needs assistance Equipment used: 2 person hand held assist Transfers: Sit to/from Stand Sit to Stand: +2 physical assistance;Min assist         General transfer comment: assist to rise and steady, performed x 2 from EOB, took several steps toward [facility]    Balance Overall balance assessment: Needs assistance Sitting-balance support: Feet supported Sitting balance-Leahy Scale: Fair        Standing balance-Leahy Scale: Poor Standing balance comment: requires +2 min assist, fatigues quickly in standing                           ADL either performed or assessed with clinical judgement   ADL                                         General ADL Comments: currently requiring total assist     Vision   Additional Comments: vision appears intact, not formally assessed, could see TV     Perception     Praxis      Pertinent Vitals/Pain Pain Assessment: Faces Faces Pain Scale: Hurts a little bit Pain Location: points to lower abdomen Pain Descriptors / Indicators: Discomfort Pain Intervention(s): Monitored during session;Repositioned     Hand Dominance     Extremity/Trunk Assessment Upper Extremity Assessment Upper Extremity Assessment: Generalized weakness(edematous hands)       Cervical / Trunk Assessment Cervical / Trunk Assessment: Kyphotic(maintains head toward ventilator, but able to turn it to L)   Communication Communication Communication: Other (comment)(ETT)   Cognition Arousal/Alertness: Awake/alert Behavior During Therapy: WFL for tasks assessed/performed Overall Cognitive Status: Difficult to assess                                 General Comments: following commands well   General Comments       Exercises  Shoulder Instructions      Home Living Family/patient expects to be discharged to:: Private residence Living Arrangements: Other relatives(sister) Available Help at Discharge: Family;Available 24 hours/day Type of Home: House Home Access: Stairs to enter [employer] of Steps: 3 Entrance Stairs-Rails: Right Home Layout: Two level Alternate Level Stairs-Number of Steps: 14 Alternate Level Stairs-Rails: Right Bathroom Shower/Tub: Teacher, early years/pre: Standard     Home Equipment: None          Prior Functioning/Environment Level of Independence:  Independent                 OT Problem List: Decreased strength;Decreased activity tolerance;Impaired balance (sitting and/or standing);Decreased cognition;Decreased safety awareness;Decreased knowledge of use of DME or AE;Decreased knowledge of precautions;Obesity;Impaired UE functional use;Cardiopulmonary status limiting activity      OT Treatment/Interventions: Self-care/ADL training;Therapeutic exercise;Energy conservation;DME and/or AE instruction;Therapeutic activities;Patient/family education;Balance training    OT Goals(Current goals can be found in the care plan section) Acute Rehab OT Goals Patient Stated Goal: pt agreeable to working with therapies OT Goal Formulation: With patient Time For Goal Achievement: 03/10/20 Potential to Achieve Goals: Good  OT Frequency: Min 2X/week   Barriers to D/C:            Co-evaluation PT/OT/SLP Co-Evaluation/Treatment: Yes Reason for Co-Treatment: Complexity of the patient's impairments (multi-system involvement)   OT goals addressed during session: ADL's and self-care;Strengthening/ROM      AM-PAC OT "6 Clicks" Daily Activity     Outcome Measure Help from another person eating meals?: Total Help from another person taking care of personal grooming?: Total Help from another person toileting, which includes using toliet, bedpan, or urinal?: Total Help from another person bathing (including washing, rinsing, drying)?: Total Help from another person to put on and taking off regular upper body clothing?: Total Help from another person to put on and taking off regular lower body clothing?: Total 6 Click Score: 6   End of Session Nurse Communication: Mobility status  Activity Tolerance: Patient tolerated treatment well Patient left: in bed;with nursing/sitter in room;with bed alarm set(in chair position)  OT Visit Diagnosis: Muscle weakness (generalized) (M62.81);Other abnormalities of gait and mobility (R26.89)                 Time: 3419-6222 OT Time Calculation (min): 32 min Charges:  OT General Charges $OT Visit: 1 Visit OT Evaluation $OT Re-eval: 1 Re-eval  [name], OTR/L Acute Rehabilitation Services Pager: [phone] Office: [phone]  Miles, Leyda 02/25/2020, 10:30 AM

## 2020-02-25 NOTE — Progress Notes (Signed)
ANTICOAGULATION CONSULT NOTE  Pharmacy Consult for heparin Indication: pulmonary embolus  No Known Allergies  Patient Measurements: Height: 5' 8" (172.7 cm) Weight: 221 lb 12.5 oz (100.6 kg) IBW/kg (Calculated) : 68.4 Heparin Dosing Weight: 94 kg  Vital Signs: Temp: 98.7 F (37.1 C) (03/04 0746) Temp Source: Oral (03/04 0746) BP: 112/70 (03/04 0800) Pulse Rate: 67 (03/04 0800)  Labs: Recent Labs    02/23/20 0255 02/23/20 0255 02/23/20 1200 02/24/20 0301 02/25/20 0437  HGB 8.5*   < >  --  8.3* 8.0*  HCT 28.1*  --   --  27.7* 27.7*  PLT 276  --   --  286 301  LABPROT 14.6  --   --  14.7 15.6*  INR 1.2  --   --  1.2 1.3*  HEPARINUNFRC 0.29*   < > 0.44 0.42 0.45  CREATININE 1.28*  --   --  1.18 1.38*   < > = values in this interval not displayed.    Estimated Creatinine Clearance: 60.5 mL/min (A) (by C-G formula based on SCr of 1.38 mg/dL (H)).   Assessment: 69 yr old male presented with abdominal pain/SOB, hx COVID infection in January, now with bilateral PE on CT.  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  2/22 night and 2/23 morning the patient has been experiencing epistaxis and the heparin was continued until the patient experienced life threatening bleeding 2/23 afternoon and a code blue was called. The nose was packed and cauterized by ENT at this time the patient was emergently intubated and brought to the ICU.   Noted new DVT and RV strain on echo. Heparin restarted 02/16/20.  Now s/p OR yesterday with ENT for nasal bleeding. Packing is now removed and no further bleeding  Hep lvl remains in goal  Cbc stable  Goal of Therapy:  Heparin level 0.3-0.5 units/ml Monitor platelets by anticoagulation protocol: Yes   Plan:  Continue Heparin 1000 units/hr Daily heparin level and CBC Monitor closely for any significant increase in bleeding  Barth Kirks, PharmD, BCPS,  BCCCP Clinical Pharmacist [phone]  Please check AMION for all [place] numbers  02/25/2020 9:01 AM

## 2020-02-25 NOTE — Progress Notes (Addendum)
Physical Therapy Treatment Patient Details Name: Jesus Russell MRN: [id number] DOB: March 03, 1951 Today's Date: 02/25/2020    History of Present Illness 69 yo admitted 02/15/20 with abdominal pain with acute PE 2/21, 2/22 epistaxis, pt intubated 2/23-2/27. Pt reintubated 2/28 with nosebleed and still on vent on re0eval 3/4.  Returned to OR on 02/24/20 due to nasal bleeding. PMhx: Pt seen by P.T. 01/25/20 due to T6 compression fx, Lupus, covid 1/13, HTN, sarcoidosis    PT Comments    Pt admitted with above diagnosis. Pt was able to sit EOB with min guard assist and perform LE exercises. Stood with mod to min assist of 2 and took a few steps to [facility].  Fatigues quickly but overall tolerated treatment well.  VSS.  Pt following all commands.  Wants tube out. Continue goals set initially.    Pt currently with functional limitations due to balance and endurance deficits. Pt will benefit from skilled PT to increase their independence and safety with mobility to allow discharge to the venue listed below.     Follow Up Recommendations  CIR;Supervision/Assistance - 24 hour     Equipment Recommendations  Rolling walker with 5" wheels;Wheelchair (measurements PT);3in1 (PT)    Recommendations for Other Services Rehab consult     Precautions / Restrictions Precautions Precautions: Fall Precaution Comments: ETT, feeding tube, no blowing nose Restrictions Weight Bearing Restrictions: No    Mobility  Bed Mobility Overal bed mobility: Needs Assistance Bed Mobility: Supine to Sit;Sit to Supine;Rolling Rolling: Min assist   Supine to sit: Min assist Sit to supine: Mod assist;+2 for physical assistance   General bed mobility comments: verbal cues for to bend knees up and reach for rail/therapist's hand, min physical assist to raise trunk, positioned hips at EOB without assist, assisted LEs back into bed  Transfers Overall transfer level: Needs assistance Equipment used: 2 person hand held assist Transfers:  Sit to/from Stand Sit to Stand: +2 physical assistance;Min assist         General transfer comment: assist to rise and steady, performed x 2 from EOB, took several steps toward [facility] with mod to max assist to steady as pt having difficulty unweighting the right LE to be able to step to [facility].    Ambulation/Gait             General Gait Details: unable as pt intubated and unsteady standing at EOB   Stairs             Wheelchair Mobility    Modified Rankin (Stroke Patients Only)       Balance Overall balance assessment: Needs assistance Sitting-balance support: Feet supported;Bilateral upper extremity supported;No upper extremity supported Sitting balance-Leahy Scale: Fair Sitting balance - Comments: Pt with lean to right initially and needing min guard assist to sit.   Standing balance support: Bilateral upper extremity supported;During functional activity Standing balance-Leahy Scale: Poor Standing balance comment: requires +2 min assist, fatigues quickly in standing                            Cognition Arousal/Alertness: Awake/alert Behavior During Therapy: WFL for tasks assessed/performed Overall Cognitive Status: Difficult to assess                                 General Comments: following commands well      Exercises General Exercises - Lower Extremity Ankle Circles/Pumps: AROM;Both;10 reps;Supine Long  Arc Quad: Both;Seated;10 reps;AROM Straight Leg Raises: AROM;Both;10 reps;Supine    General Comments General comments (skin integrity, edema, etc.): VSS with pt on PS/CPAP on vent with 30% FiO2 and PEEP 5.        Pertinent Vitals/Pain Pain Assessment: Faces Faces Pain Scale: Hurts a little bit Pain Location: points to lower abdomen Pain Descriptors / Indicators: Discomfort Pain Intervention(s): Limited activity within patient's tolerance;Monitored during session;Repositioned    Home Living Family/patient expects to be  discharged to:: Private residence Living Arrangements: Other relatives(sister) Available Help at Discharge: Family;Available 24 hours/day Type of Home: House Home Access: Stairs to enter Entrance Stairs-Rails: Right Home Layout: Two level Home Equipment: None      Prior Function Level of Independence: Independent          PT Goals (current goals can now be found in the care plan section) Acute Rehab PT Goals Patient Stated Goal: pt agreeable to working with therapies PT Goal Formulation: With patient Time For Goal Achievement: 03/06/20 Potential to Achieve Goals: Fair Progress towards PT goals: Progressing toward goals    Frequency    Min 3X/week      PT Plan Current plan remains appropriate    Co-evaluation PT/OT/SLP Co-Evaluation/Treatment: Yes Reason for Co-Treatment: Complexity of the patient's impairments (multi-system involvement);For patient/therapist safety PT goals addressed during session: Mobility/safety with mobility OT goals addressed during session: ADL's and self-care;Strengthening/ROM      AM-PAC PT "6 Clicks" Mobility   Outcome Measure  Help needed turning from your back to your side while in a flat bed without using bedrails?: A Little Help needed moving from lying on your back to sitting on the side of a flat bed without using bedrails?: A Little Help needed moving to and from a bed to a chair (including a wheelchair)?: A Lot Help needed standing up from a chair using your arms (e.g., wheelchair or bedside chair)?: A Lot Help needed to walk in hospital room?: Total Help needed climbing 3-5 steps with a railing? : Total 6 Click Score: 12    End of Session Equipment Utilized During Treatment: Gait belt;Oxygen(on vent ) Activity Tolerance: Patient tolerated treatment well Patient left: in bed;with bed alarm set;with restraints reapplied;with SCD's reapplied(pt in 60 degree chair position) Nurse Communication: Mobility status;Precautions PT Visit  Diagnosis: Muscle weakness (generalized) (M62.81);Difficulty in walking, not elsewhere classified (R26.2);Other abnormalities of gait and mobility (R26.89)     Time: 6010-9323 PT Time Calculation (min) (ACUTE ONLY): 32 min  Charges:    $$Re-evaluation: 8-22 min                   Kashawn Dirr W,PT Acute Rehabilitation Services Pager:  [phone]  Office:  [place] 02/25/2020, 11:00 AM

## 2020-02-25 NOTE — Progress Notes (Signed)
NAME:  Jesus Russell, MRN:  [id number], DOB:  1951-08-19, LOS: 63 ADMISSION DATE:  02/14/2020, CONSULTATION DATE:  2/23  REFERRING MD:  Dr. Rebeca Alert CHIEF COMPLAINT: acute respiratory failure in setting of massive epistaxis    Brief History   69 year old male with history of recent COVID (01/06/2020) sarcoidosis, antiphospholipid antibody syndrome, prior PE and DVT, had been on Coumadin but had stopped this in order to take naproxen.  Admitted 2/21 with acute PE.  Developed worsening epistaxis and progressive respiratory failure requiring intubation on 2/23.  Past Medical History  Sarcoidosis, antiphospholipid ab syndrome. Recent PE.  Significant Hospital Events   2/21 admitted + PE 2/22 started to have epistaxis.  2/23 Epistaxis continued. Left nare packed. Intubation w/ FOB negative for airway obstruction. ECHO w/ new RV failure suggesting progression of PE. Had obstructive shock physiology. Heparin restarted. Supported on [employer]. + DVT BLEs.  2/24 Pressor requirements improving. Not ready to wean. New AKI. But bleeding seems to have stopped.  2/26 Nasal packing in place, no further bleeding  2/27 Nasal packing removed per ENT, initially no bleeding. Pt extubated.  2/28 Recurrent nose bleeding with respiratory distress requiring intubation, repeat nasal packing.   3/01 ENT called back to eval patient given recurrent epistaxis/bleeding  3/03 To OR for nasal cautery  Consults:  ENT PCCM  Procedures:  ETT 2/23 >> 2/27  Left [place] TLC 2/23 >> 2/27?  Significant Diagnostic Tests:   2/21 CT angio chest: Positive for pulmonary emboli with combination of occlusive and nonocclusive embolus in the lower lobe lungs bilaterally from segmental to subsegmental vessel size  2/22 Echocardiogram: Demonstrated LVEF 60 to 56% grade 1 diastolic dysfunction RV severely enlarged tricuspid regurgitation RA was normal  2/23 [place] BLE >> Findings consistent with acute deep vein thrombosis involving the right  common femoral vein, SF junction, right femoral vein, right proximal profunda vein, right popliteal vein, right posterior tibial veins, and right gastrocnemius veins- Findings consistent with acute deep vein thrombosis involving the left posterior tibial veins.  2/23 ECHO >> Left ventricular ejection fraction, by estimation, is 50 to 55%. The  left ventricle has low normal function. The left ventricle has no regional  wall motion abnormalities.  The images are very technically difficult . The RV could only be seen using Definity contrast. The RV appears to be very dilated and with severely reduced RV function There is moderate pulmonary artery hypertension . Right ventricular systolic function is severely reduced. The right ventricular size is  severely enlarged. There is moderately elevated pulmonary artery systolic pressure.    Micro Data:  COVID 2/21 >> positive  Antimicrobials:  Unasyn 2/23 >>2/27 Unasyn 3/1 >> 3/4  Interim history/subjective:  No acute events overnight  Weaning on PSV Glucose 105-130  Tmax 98.8 / WBC 11  I/O - UOP 1.3L, +1.2L in 24 hours   Objective   Blood pressure 119/82, pulse 80, temperature 98.7 F (37.1 C), temperature source Oral, resp. rate 18, height _0  (1.727 m), weight 100.6 kg, SpO2 100 %.    Vent Mode: PRVC FiO2 (%):  [30 %] 30 % Set Rate:  [14 bmp] 14 bmp Vt Set:  [560 mL] 560 mL PEEP:  [5 cmH20] 5 cmH20 Plateau Pressure:  [14 cmH20-149 cmH20] 149 cmH20   Intake/Output Summary (Last 24 hours) at 02/25/2020 1037 Last data filed at 02/25/2020 0949 Gross per 24 hour  Intake 2465.92 ml  Output 1695 ml  Net 770.92 ml   Filed Weights   02/23/20 0500  02/24/20 0441 02/25/20 0454  Weight: 100 kg 98.9 kg 100.6 kg    Examination: General: adult male lying in bed in NAD on vent  HEENT: MM pink/moist, ETT, no nasal packing, no epistaxis  Neuro: Awake, alert, follows commands, generalized weakness  CV: s1s2 RRR, no m/r/g PULM:  Non-labored on PSV,  lungs bilaterally clear GI: soft, bsx4 active  Extremities: warm/dry, BLE 2-3+ pitting edema  Skin: no rashes or lesions   Resolved Hospital Problem list   Circulatory Shock / Obstructive Shock 2/2 PE w/ Acute Right Ventricular Failure c/b underlying chronic PH- resolved Constipation: miralax PRN  Assessment & Plan:   Acute hypoxic respiratory failure, in setting of acute PE +/- aspiration event with epistaxis.  Small Bilateral Pleural Effusions Large clot on ETT when extubated 2/27.  Failed with recurrent nose bleed requiring re-packing & intubation 2/28 pm.  -PSV ventilation with goal for extubation  -Stop sedation in anticipation for extubation  -lasix, acetazolamide x1 + KCL  -appreciate ENT assistance with patient care  Acute pulmonary embolus w/ bilateral acute DVT, livedo reticularis Antiphospholipid Antibody -continue heparin gtt per pharmacy   Epistaxis Status post packing x2, intubation x2, OR for cautery on 3/3 -stop abx after 3/4 dosing  -appreciate ENT   AKI  Hypernatremia  Suspect ATN 2/2 decreased end-organ perfusion in shock state -increase free water -lasix, acetazolamide as above  -Trend BMP / urinary output -Replace electrolytes as indicated -Avoid nephrotoxic agents, ensure adequate renal perfusion  History of Sarcoidosis Recently tapered off steroids -continue bronchodilators -hold home PJP coverage (bactrim), may not need if steroids not restarted  Subacute T8 fracture Was on chronic steroids / taper, reportedly off on admit  -supportive care  At Risk Malnutrition  -SLP evaluation post extubation for swallowing    Metabolic encephalopathy vs. Baseline Cognitive Deficit Lives in facility at baseline - it is a drug / rehab program facility but stayed on and lives there.  Would typically wake around noon, eat breakfast, read, watch TV. Slept a lot in the last few months, confusion noted at times. 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 Was private / didn't share medical info with family.  -supportive care -PT efforts -delirium prevention measures   Hypoglycemia  -Monitor glucose closely  Best practice:  Diet: Dysphagia Pain/Anxiety/Delirium protocol (if indicated): in place, will hold post extubation VAP protocol (if indicated): ordered  DVT prophylaxis: heparin infusion GI prophylaxis: PTA PPI Glucose control: Not indicated Mobility: PT/OT Code Status: Full code Family Communication: Sister Vicente Males) called for update 3/4, plan of care reviewed.   Disposition: ICU   CC Time: 90 minutes    Noe Gens, MSN, NP-C [facility] 02/25/2020, 10:37 AM   Please see [email or web address] for pager details.

## 2020-02-26 DIAGNOSIS — N179 Acute kidney failure, unspecified: Secondary | ICD-10-CM

## 2020-02-26 DIAGNOSIS — E8770 Fluid overload, unspecified: Secondary | ICD-10-CM

## 2020-02-26 LAB — BASIC METABOLIC PANEL
Anion gap: 13 (ref 5–15)
BUN: 41 mg/dL — ABNORMAL HIGH (ref 8–23)
CO2: 30 mmol/L (ref 22–32)
Calcium: 9.1 mg/dL (ref 8.9–10.3)
Chloride: 110 mmol/L (ref 98–111)
Creatinine, Ser: 1.3 mg/dL — ABNORMAL HIGH (ref 0.61–1.24)
GFR calc Af Amer: >60 mL/min (ref >60)
GFR calc non Af Amer: 57 mL/min — ABNORMAL LOW (ref >60)
Glucose, Bld: 97 mg/dL (ref 70–99)
Potassium: 3.9 mmol/L (ref 3.5–5.1)
Sodium: 153 mmol/L — ABNORMAL HIGH (ref 135–145)

## 2020-02-26 LAB — HEPARIN LEVEL (UNFRACTIONATED)
Heparin Unfractionated: 0.55 [IU]/mL (ref 0.30–0.70)
Heparin Unfractionated: 0.32 [IU]/mL (ref 0.30–0.70)

## 2020-02-26 LAB — PROTIME-INR
INR: 1.2 (ref 0.8–1.2)
Prothrombin Time: 15.2 s (ref 11.4–15.2)

## 2020-02-26 LAB — CBC
HCT: 30.1 % — ABNORMAL LOW (ref 39.0–52.0)
Hemoglobin: 8.8 g/dL — ABNORMAL LOW (ref 13.0–17.0)
MCH: 32.6 pg (ref 26.0–34.0)
MCHC: 29.2 g/dL — ABNORMAL LOW (ref 30.0–36.0)
MCV: 111.5 fL — ABNORMAL HIGH (ref 80.0–100.0)
Platelets: 400 10*3/uL (ref 150–400)
RBC: 2.7 MIL/uL — ABNORMAL LOW (ref 4.22–5.81)
RDW: 16.2 % — ABNORMAL HIGH (ref 11.5–15.5)
WBC: 13.2 10*3/uL — ABNORMAL HIGH (ref 4.0–10.5)
nRBC: 0.2 % (ref 0.0–0.2)

## 2020-02-26 LAB — GLUCOSE, CAPILLARY
Glucose-Capillary: 103 mg/dL — ABNORMAL HIGH (ref 70–99)
Glucose-Capillary: 83 mg/dL (ref 70–99)
Glucose-Capillary: 134 mg/dL — ABNORMAL HIGH (ref 70–99)
Glucose-Capillary: 166 mg/dL — ABNORMAL HIGH (ref 70–99)
Glucose-Capillary: 109 mg/dL — ABNORMAL HIGH (ref 70–99)

## 2020-02-26 MED ORDER — FUROSEMIDE 10 MG/ML IJ SOLN
40.0000 mg | Freq: Once | INTRAMUSCULAR | Status: AC
  Administered 2020-02-26: 13:00:00 40 mg via INTRAVENOUS
  Filled 2020-02-26: qty 4

## 2020-02-26 MED ORDER — POTASSIUM CHLORIDE CRYS ER 20 MEQ PO TBCR
40.0000 meq | EXTENDED_RELEASE_TABLET | Freq: Once | ORAL | Status: AC
  Administered 2020-02-26: 40 meq via ORAL
  Filled 2020-02-26: qty 2

## 2020-02-26 MED ORDER — CHLORHEXIDINE GLUCONATE 0.12 % MT SOLN
15.0000 mL | Freq: Two times a day (BID) | OROMUCOSAL | Status: DC
  Administered 2020-02-26 – 2020-03-01 (×7): 15 mL via OROMUCOSAL
  Filled 2020-02-26 (×3): qty 15

## 2020-02-26 MED ORDER — POLYETHYLENE GLYCOL 3350 17 G PO PACK: 17.0000 g | PACK | Freq: Every day | ORAL | Status: DC | PRN

## 2020-02-26 MED ORDER — DEXTROSE 5 % IV SOLN: INTRAVENOUS | Status: DC

## 2020-02-26 MED ORDER — ENSURE ENLIVE PO LIQD
237.0000 mL | Freq: Three times a day (TID) | ORAL | Status: DC
  Administered 2020-02-26 – 2020-03-01 (×12): 237 mL via ORAL

## 2020-02-26 MED ORDER — ORAL CARE MOUTH RINSE
15.0000 mL | Freq: Two times a day (BID) | OROMUCOSAL | Status: DC
  Administered 2020-02-26 – 2020-03-01 (×8): 15 mL via OROMUCOSAL

## 2020-02-26 NOTE — Progress Notes (Signed)
[facility] for heparin Indication: pulmonary embolus  No Known Allergies  Patient Measurements: Height: _0  (172.7 cm) Weight: 219 lb 5.7 oz (99.5 kg) IBW/kg (Calculated) : 68.4 Heparin Dosing Weight: 94 kg  Vital Signs: Temp: 97.6 F (36.4 C) (03/05 1610) Temp Source: Oral (03/05 1610) BP: 132/96 (03/05 1610) Pulse Rate: 106 (03/05 1631)  Labs: Recent Labs    02/24/20 0301 02/24/20 0301 02/25/20 0437 02/26/20 0807 02/26/20 1753  HGB 8.3*   < > 8.0* 8.8*  --   HCT 27.7*  --  27.7* 30.1*  --   PLT 286  --  301 400  --   LABPROT 14.7  --  15.6* 15.2  --   INR 1.2  --  1.3* 1.2  --   HEPARINUNFRC 0.42   < > 0.45 0.55 0.32  CREATININE 1.18  --  1.38* 1.30*  --    < > = values in this interval not displayed.    Estimated Creatinine Clearance: 63.9 mL/min (A) (by C-G formula based on SCr of 1.3 mg/dL (H)).   Assessment: 69 yr old male presented with abdominal pain/SOB, hx COVID infection in January, now with bilateral PE on CT.  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  2/22 night and 2/23 morning the patient has been experiencing epistaxis and the heparin was continued until the patient experienced life threatening bleeding 2/23 afternoon and a code blue was called. The nose was packed and cauterized by ENT at this time the patient was emergently intubated and brought to the ICU.   Noted new DVT and RV strain on echo. Heparin restarted 02/16/20.  Now s/p OR Wed with ENT for nasal bleeding. Packing is now removed and no further bleeding.  Follow up heparin level tonight is within goal range, no adjustments warranted.  Goal of Therapy:  Heparin level 0.3-0.5 units/ml Monitor platelets by anticoagulation protocol: Yes   Plan:  Continue Heparin at 900 units/hr Daily heparin level and CBC Monitor closely for any significant increase in  bleeding  Erin Hearing PharmD., BCPS Clinical Pharmacist 02/26/2020 6:39 PM  Please check AMION for all [place] numbers

## 2020-02-26 NOTE — Plan of Care (Signed)
Pt has remained extubated, remained on venturi mask at 45%.  Sats remain >/=90% or better with mask in place.  With mask off for > 3 minutes, pt drops down to low to mid-80s.  Pt remains alert/oriented x 3.  At times, confused of location.  Pt awaiting Speech eval for swallowing today prior to starting Diet.  Pt remains NPO but allowed to have ice chips.  Pt taking ice chips without issue.  Problem: Nutrition: Goal: Adequate nutrition will be maintained Outcome: Not Progressing

## 2020-02-26 NOTE — Progress Notes (Signed)
Physical Therapy Treatment Patient Details Name: Jesus Russell MRN: [id number] DOB: 08-Nov-1951 Today's Date: 02/26/2020    History of Present Illness 69 yo admitted 02/15/20 with abdominal pain with acute PE 2/21, 2/22 epistaxis, pt intubated 2/23-2/27. Pt reintubated 2/28 with nosebleed and still on vent on re0eval 3/4.  Returned to OR on 02/24/20 due to nasal bleeding. PMhx: Pt seen by P.T. 01/25/20 due to T6 compression fx, Lupus, covid 1/13, HTN, sarcoidosis    PT Comments    Pt admitted with above diagnosis. Pt was able to stand and pivot to recliner with RW with  min to mod assist for steadying as pt unsteady on his feet.  Could not progress ambulation as pt struggled to just pivot to the chair due to instability bil LEs.  Pt currently with functional limitations due to balance and endurance deficits. Pt will benefit from skilled PT to increase their independence and safety with mobility to allow discharge to the venue listed below.     Follow Up Recommendations  CIR;Supervision/Assistance - 24 hour     Equipment Recommendations  Rolling walker with 5" wheels;Wheelchair (measurements PT);3in1 (PT)    Recommendations for Other Services Rehab consult     Precautions / Restrictions Precautions Precautions: Fall Precaution Comments: 45% face mask with 10LO2 Restrictions Weight Bearing Restrictions: No    Mobility  Bed Mobility Overal bed mobility: Needs Assistance Bed Mobility: Supine to Sit;Sit to Supine;Rolling Rolling: Min assist Sidelying to sit: +2 for physical assistance;Min assist;Mod assist       General bed mobility comments: verbal cues for to bend knees up and reach for rail/therapist's hand, min to mod  physical assist to raise trunk, positioned hips at EOB without assist, assisted LEs back into bed  Transfers Overall transfer level: Needs assistance Equipment used: Rolling walker (2 wheeled) Transfers: Sit to/from [facility] Sit to Stand: Min  assist;Mod assist;+2 safety/equipment;From elevated surface Stand pivot transfers: Mod assist;+2 safety/equipment       General transfer comment: needed assist to rise and steady with pt demonstrateing knee instability bilaterally.  took pivotal steps to chair with need for stedying and cues throughout.    Ambulation/Gait             General Gait Details: unable to progress due to instability on feet   Stairs             Wheelchair Mobility    Modified Rankin (Stroke Patients Only)       Balance Overall balance assessment: Needs assistance Sitting-balance support: Feet supported;Bilateral upper extremity supported;No upper extremity supported Sitting balance-Leahy Scale: Fair Sitting balance - Comments: Pt with lean to right initially and needing min guard assist to sit.   Standing balance support: Bilateral upper extremity supported;During functional activity Standing balance-Leahy Scale: Poor Standing balance comment: requires +2 min to mod assist and RW , fatigues quickly in standing                            Cognition Arousal/Alertness: Awake/alert Behavior During Therapy: WFL for tasks assessed/performed Overall Cognitive Status: Difficult to assess Area of Impairment: Orientation;Safety/judgement;Problem solving;Following commands                 Orientation Level: Disoriented to;Time     Following Commands: Follows one step commands consistently Safety/Judgement: Decreased awareness of safety;Decreased awareness of deficits   Problem Solving: Slow processing;Requires verbal cues;Requires tactile cues General Comments: following commands well  Exercises General Exercises - Lower Extremity Ankle Circles/Pumps: AROM;Both;10 reps;Supine Long Arc Quad: Both;Seated;10 reps;AROM    General Comments General comments (skin integrity, edema, etc.): Sats 90% on RA when PT arrived. Placed face mask on to keep sats >90% while pivoting  to chair. Removed face mask at end of treatement as NP would like to see how he does without O2.  Nurse aware.  Other VSS.       Pertinent Vitals/Pain Pain Assessment: Faces Faces Pain Scale: Hurts a little bit Pain Location: "discomfort all over" Pain Descriptors / Indicators: Discomfort Pain Intervention(s): Limited activity within patient's tolerance;Monitored during session;Repositioned    Home Living                      Prior Function            PT Goals (current goals can now be found in the care plan section) Acute Rehab PT Goals Patient Stated Goal: pt agreeable to working with therapies Progress towards PT goals: Progressing toward goals    Frequency    Min 3X/week      PT Plan Current plan remains appropriate    Co-evaluation              AM-PAC PT "6 Clicks" Mobility   Outcome Measure  Help needed turning from your back to your side while in a flat bed without using bedrails?: A Little Help needed moving from lying on your back to sitting on the side of a flat bed without using bedrails?: A Little Help needed moving to and from a bed to a chair (including a wheelchair)?: A Lot Help needed standing up from a chair using your arms (e.g., wheelchair or bedside chair)?: A Lot Help needed to walk in hospital room?: Total Help needed climbing 3-5 steps with a railing? : Total 6 Click Score: 12    End of Session Equipment Utilized During Treatment: Gait belt;Oxygen Activity Tolerance: Patient tolerated treatment well;Patient limited by fatigue Patient left: in chair;with call bell/phone within reach;with chair alarm set(pt in 60 degree chair position) Nurse Communication: Mobility status PT Visit Diagnosis: Muscle weakness (generalized) (M62.81);Difficulty in walking, not elsewhere classified (R26.2);Other abnormalities of gait and mobility (R26.89)     Time: 9604-5409 PT Time Calculation (min) (ACUTE ONLY): 25 min  Charges:  $Therapeutic  Exercise: 8-22 mins $Therapeutic Activity: 8-22 mins                     Shironda Kain W,PT Acute Rehabilitation Services Pager:  [phone]  Office:  [place] 02/26/2020, 10:45 AM

## 2020-02-26 NOTE — Progress Notes (Signed)
ANTICOAGULATION CONSULT NOTE  Pharmacy Consult for heparin Indication: pulmonary embolus  No Known Allergies  Patient Measurements: Height: 5' 8" (172.7 cm) Weight: 219 lb 5.7 oz (99.5 kg) IBW/kg (Calculated) : 68.4 Heparin Dosing Weight: 94 kg  Vital Signs: Temp: 98.4 F (36.9 C) (03/05 0700) Temp Source: Axillary (03/05 0700) BP: 151/97 (03/05 0700) Pulse Rate: 89 (03/05 0851)  Labs: Recent Labs    02/24/20 0301 02/24/20 0301 02/25/20 0437 02/26/20 0807  HGB 8.3*   < > 8.0* 8.8*  HCT 27.7*  --  27.7* 30.1*  PLT 286  --  301 400  LABPROT 14.7  --  15.6* 15.2  INR 1.2  --  1.3* 1.2  HEPARINUNFRC 0.42  --  0.45 0.55  CREATININE 1.18  --  1.38* 1.30*   < > = values in this interval not displayed.    Estimated Creatinine Clearance: 63.9 mL/min (A) (by C-G formula based on SCr of 1.3 mg/dL (H)).   Assessment: 69 yr old male presented with abdominal pain/SOB, hx COVID infection in January, now with bilateral PE on CT.  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  2/22 night and 2/23 morning the patient has been experiencing epistaxis and the heparin was continued until the patient experienced life threatening bleeding 2/23 afternoon and a code blue was called. The nose was packed and cauterized by ENT at this time the patient was emergently intubated and brought to the ICU.   Noted new DVT and RV strain on echo. Heparin restarted 02/16/20.  Now s/p OR Wed with ENT for nasal bleeding. Packing is now removed and no further bleeding  Hep lvl slightly above the low goal we are targeting  Cbc stable  Goal of Therapy:  Heparin level 0.3-0.5 units/ml Monitor platelets by anticoagulation protocol: Yes   Plan:  Reduce Heparin 900 units/hr 1800 HL Daily heparin level and CBC Monitor closely for any significant increase in bleeding  Barth Kirks, PharmD, BCPS, BCCCP Clinical  Pharmacist [phone]  Please check AMION for all [place] numbers  02/26/2020 9:35 AM

## 2020-02-26 NOTE — Progress Notes (Signed)
NAME:  Jesus Russell, MRN:  [id number], DOB:  1951/11/15, LOS: 55 ADMISSION DATE:  02/14/2020, CONSULTATION DATE:  2/23  REFERRING MD:  Dr. Rebeca Alert CHIEF COMPLAINT: acute respiratory failure in setting of massive epistaxis    Brief History   69 year old male with history of recent COVID (01/06/2020) sarcoidosis, antiphospholipid antibody syndrome, prior PE and DVT, had been on Coumadin but had stopped this in order to take naproxen.  Admitted 2/21 with acute PE.  Developed worsening epistaxis and progressive respiratory failure requiring intubation on 2/23.  Past Medical History  Sarcoidosis, antiphospholipid ab syndrome. Recent PE.  Significant Hospital Events   2/21 admitted + PE 2/22 started to have epistaxis.  2/23 Epistaxis continued. Left nare packed. Intubation w/ FOB negative for airway obstruction. ECHO w/ new RV failure suggesting progression of PE. Had obstructive shock physiology. Heparin restarted. Supported on [employer]. + DVT BLEs.  2/24 Pressor requirements improving. Not ready to wean. New AKI. But bleeding seems to have stopped.  2/26 Nasal packing in place, no further bleeding  2/27 Nasal packing removed per ENT, initially no bleeding. Pt extubated.  2/28 Recurrent nose bleeding with respiratory distress requiring intubation, repeat nasal packing.   3/01 ENT called back to eval patient given recurrent epistaxis/bleeding  3/03 To OR for nasal cautery 3/04 Extubated (put on VM to avoid [employer] prongs in the nose / drying)  Consults:  ENT PCCM  Procedures:  ETT 2/23 >> 2/27  Left [place] TLC 2/23 >> 2/27?  Significant Diagnostic Tests:   2/21 CT angio chest: Positive for pulmonary emboli with combination of occlusive and nonocclusive embolus in the lower lobe lungs bilaterally from segmental to subsegmental vessel size  2/22 Echocardiogram: Demonstrated LVEF 60 to 10% grade 1 diastolic dysfunction RV severely enlarged tricuspid regurgitation RA was normal  2/23 [place] BLE >> Findings  consistent with acute deep vein thrombosis involving the right common femoral vein, SF junction, right femoral vein, right proximal profunda vein, right popliteal vein, right posterior tibial veins, and right gastrocnemius veins- Findings consistent with acute deep vein thrombosis involving the left posterior tibial veins.  2/23 ECHO >> Left ventricular ejection fraction, by estimation, is 50 to 55%. The  left ventricle has low normal function. The left ventricle has no regional  wall motion abnormalities.  The images are very technically difficult . The RV could only be seen using Definity contrast. The RV appears to be very dilated and with severely reduced RV function There is moderate pulmonary artery hypertension . Right ventricular systolic function is severely reduced. The right ventricular size is  severely enlarged. There is moderately elevated pulmonary artery systolic pressure.    Micro Data:  COVID 2/21 >> positive  Antimicrobials:  Unasyn 2/23 >>2/27 Unasyn 3/1 >> 3/4  Interim history/subjective:  Pt reports he is hungry, ready to eat  O2 removed, pt remains 90% or above during interview Glucose range 83-103 I/O 2.2L UOP, -1.6L in 24h Afebrile  Objective   Blood pressure (!) 151/97, pulse 89, temperature 98.4 F (36.9 C), temperature source Axillary, resp. rate (!) 24, height _0  (1.727 m), weight 99.5 kg, SpO2 92 %.    FiO2 (%):  [45 %] 45 %   Intake/Output Summary (Last 24 hours) at 02/26/2020 0912 Last data filed at 02/26/2020 0800 Gross per 24 hour  Intake 501.03 ml  Output 1800 ml  Net -1298.97 ml   Filed Weights   02/24/20 0441 02/25/20 0454 02/26/20 0500  Weight: 98.9 kg 100.6 kg 99.5 kg  Examination: General: adult male lying in bed in NAD HEENT: MM pink/moist, small amt dried blood on upper lip Neuro: Awake, alert, oriented, speech clear, generalized weakness but moving all extremities  CV: s1s2 RRR, no m/r/g PULM:  Non-labored, lungs bilaterally  clear GI: soft, bsx4 active  Extremities: warm/dry, 2+ BLE pitting edema  Skin: no rashes or lesions   Resolved Hospital Problem list   Circulatory Shock / Obstructive Shock 2/2 PE w/ Acute Right Ventricular Failure c/b underlying chronic PH- resolved Constipation: miralax PRN  Assessment & Plan:   Acute hypoxic respiratory failure, in setting of acute PE +/- aspiration event with epistaxis.  Small Bilateral Pleural Effusions Large clot on ETT when extubated 2/27.  Failed with recurrent nose bleed requiring re-packing & intubation 2/28 pm.  -PSV ventilation with goal for extubation  -Stop sedation in anticipation for extubation  -lasix, acetazolamide x1 + KCL  -appreciate ENT assistance with patient care  Acute PE w/ bilateral acute DVT, livedo reticularis Antiphospholipid Antibody -continue heparin gtt per pharmacy  -consider transition to PO anticoagulation 3/6 if no further bleeding (will be 72h of no bleeding post cautery) -will be on lifelong anticoagulation   Epistaxis Status post packing x2, intubation x2, OR for cautery on 3/3 -monitor for further bleeding  -appreciate ENT   AKI  Hypernatremia  Suspect ATN 2/2 decreased end-organ perfusion in shock state -encourage water intake when able -D5w at 177m/hr for 1 L with hypernatremia   -Trend BMP / urinary output -Replace electrolytes as indicated -Avoid nephrotoxic agents, ensure adequate renal perfusion  History of Sarcoidosis Recently tapered off steroids -continue bronchodilators -hold home PJP coverage (bactrim), given steroids tapered off will not likely need  Subacute T8 fracture Was on chronic steroids / taper, reportedly off on admit  -supportive care  At Risk Malnutrition  -pending SLP evaluation   Acute Metabolic Encephalopathy - resolved  Lives in facility at [facility] that his family runs - it is a drug / rehab program facility but stayed on and lives there.  Would typically wake around noon, eat  breakfast, read, watch TV. Slept a lot in the last few months, confusion noted at times. 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 Was private / didn't share medical info with family.  -supportive care  -PT efforts > recommending CIR   Hypoglycemia  -follow glucose  Best practice:  Diet: Dysphagia Pain/Anxiety/Delirium protocol (if indicated): in place, will hold post extubation VAP protocol (if indicated): ordered  DVT prophylaxis: heparin infusion GI prophylaxis: PTA PPI Glucose control: Not indicated Mobility: PT/OT Code Status: Full code Family Communication:Sister (Vicente Males called for update 3/5, reviewed plan of care.   [facility]      BNoe Gens MSN, NP-C [facility] 02/26/2020, 9:12 AM   Please see [email or web address] for pager details.

## 2020-02-26 NOTE — Progress Notes (Signed)
Nutrition Follow-up  DOCUMENTATION CODES:   Obesity unspecified  INTERVENTION:    Ensure Enlive po TID, each supplement provides 350 kcal and 20 grams of protein  NUTRITION DIAGNOSIS:   Inadequate oral intake related to inability to eat as evidenced by NPO status.  Ongoing, diet just advanced  GOAL:   Patient will meet greater than or equal to 90% of their needs   Progressing  MONITOR:   PO intake, Supplement acceptance, Diet advancement, Labs  REASON FOR ASSESSMENT:   Ventilator, Consult Enteral/tube feeding initiation and management  ASSESSMENT:   69 yo male admitted with PE. Developed acute respiratory failure in the setting of massive epistaxis and was intubated on 2/23. PMH includes HTN, sarcoidosis, antiphospholipid antibody syndrome.   S/P cauterization 3/3. Extubated 3/4.  S/P SLP evaluation this morning; diet advanced to dysphagia 2 with thin liquids. Patient tired easily during SLP evaluation. Oxygen sats declined to 92 when oxygen mask removed for PO trial. Suspect intake will be suboptimal with ongoing difficulty breathing. Will add PO supplements.   Labs reviewed. Na 153 (H) CBG's: [id number]  Medications reviewed and include vitamin D3, lasix, novolog, KCl.   Diet Order:   Diet Order            DIET DYS 2 Room service appropriate? Yes; Fluid consistency: Thin  Diet effective now              EDUCATION NEEDS:   Not appropriate for education at this time  Skin:  Skin Assessment: Reviewed RN Assessment  Last BM:  2/28 type 7  Height:   Ht Readings from Last 1 Encounters:  02/16/20 _0  (1.727 m)    Weight:   Wt Readings from Last 1 Encounters:  02/26/20 99.5 kg    Ideal Body Weight:  70 kg  BMI:  Body mass index is 33.35 kg/m.  Estimated Nutritional Needs:   Kcal:  1900-2100  Protein:  110-130 gm  Fluid:  >/= 1.8 L    Molli Barrows, RD, LDN, CNSC Please refer to Amion for contact information.

## 2020-02-26 NOTE — Evaluation (Addendum)
Clinical/Bedside Swallow Evaluation Patient Details  Name: Jesus Russell MRN: [id number] Date of Birth: 1951/08/31  Today's Date: 02/26/2020 Time: SLP Start Time (ACUTE ONLY): 0932 SLP Stop Time (ACUTE ONLY): 1000 SLP Time Calculation (min) (ACUTE ONLY): 28 min  Past Medical History:  Past Medical History:  Diagnosis Date  . DVT of lower extremity, bilateral ([place])   . Hypertension   . Incarcerated umbilical hernia [id number]   Status post repair  . Lupus anticoagulant positive    Per records from [facility]  . 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 Pulmonary artery hypertension ([place]) 04/2011   Mildly elevated pulmonary artery pressure in setting of PE (from [facility])  . Pulmonary embolism ([place]) 04/2011  . Sarcoidosis 1989  . Subdural hematoma ([facility])    Remote episode, occurred when taking excedrin and warfarin.    Past Surgical History:  Past Surgical History:  Procedure Laterality Date  . BRAIN SURGERY    . HERNIA REPAIR    . NASAL ENDOSCOPY WITH EPISTAXIS CONTROL N/A 02/24/2020   Procedure: NASAL ENDOSCOPY WITH EPISTAXIS CONTROL;  Surgeon: [name], MD;  Location: [place];  Service: ENT;  Laterality: N/A;   HPI:  [name] is a 69 year old male with a past medical history significant for positive lupus anticoagulant, PE (2012), DVT, IVC filter, COVID-19 infection on 1/13, HTN, hypertension, and sarcoidosis who presented to the ED with left lower chest/left upper quadrant abdominal pain.   Pt was intubated from 2/23-2/27. CXR on 2/27 reported "Bilateral airspace densities unchanged and appear chronic. No acute infiltrate or effusion."     Assessment / Plan / Recommendation Clinical Impression  Pt was seen at bedside for repeat BSE due to reintubation. Pt reintubated 2/28 - 3/4. Pt seated in recliner, ice chips in hand. Pt indicates he is ready for po intake. Low vocal intensity noted, but clear quality. Oral care  completed by RN - oral cavity appears moist, pink, and healthy. Pt has ventimask in place, due to not being able to use nasal cannula because of epistaxis. Pt tolerated removal of mask for po trial, with only slight decline in O2 sats (to 92, but increased with O2 mask back in place). Pt accepted trials of ice chips, thin liquid, puree, and solid textures. Multiple swallows noted on puree and solid textures, as well as increase in RR. When asked, pt agreed that he was getting tired. Pt did not exhibit overt s/s aspiration despite multiple 3oz water challenges during this assessment. Recommend dys 2 diet and thin liquids, meds whole. Pt would benefit from 1:1 assistance during meals for energy conservation. Anticipate being able to advance solid textures quickly, with improvement in strength and endurance. Safe swallow precautions posted at [facility]. RN, NP and MD informed of results and recommendations. SLP will follow up at bedside for assessment of diet tolerance, education, and readiness to advance.    SLP Visit Diagnosis: Dysphagia, unspecified (R13.10)    Aspiration Risk  Mild aspiration risk    Diet Recommendation Dysphagia 2 (Fine chop);Thin liquid   Liquid Administration via: Cup;Straw Medication Administration: Whole meds with liquid Supervision: Staff to assist with self feeding;Full supervision/cueing for compensatory strategies;Patient able to self feed Compensations: Minimize environmental distractions;Slow rate;Small sips/bites Postural Changes: Seated upright at 90 degrees;Remain upright for at least 30 minutes after po intake    Other  Recommendations Oral Care Recommendations: Oral care BID Other Recommendations: Have oral suction available  Follow up Recommendations (TBD)      Frequency and Duration min 2x/week  2 weeks       Prognosis Prognosis for Safe Diet Advancement: Good      Swallow Study   General Date of Onset: 02/16/20 HPI: [name] is a 69 year old male with  a past medical history significant for positive lupus anticoagulant, PE (2012), DVT, IVC filter, COVID-19 infection on 1/13, HTN, hypertension, and sarcoidosis who presented to the ED with left lower chest/left upper quadrant abdominal pain.   Pt was intubated from 2/23-2/27. CXR on 2/27 reported "Bilateral airspace densities unchanged and appear chronic. No acute infiltrate or effusion."   Type of Study: Bedside Swallow Evaluation Previous Swallow Assessment: BSE 02/20/20 Diet Prior to this Study: NPO Temperature Spikes Noted: No Respiratory Status: Venti-mask(unable to use nasal cannula due to epistaxis) History of Recent Intubation: Yes Length of Intubations (days): 8 days Date extubated: 02/25/20 Behavior/Cognition: Alert;Cooperative;Pleasant mood Oral Cavity Assessment: Within Functional Limits Oral Care Completed by SLP: Recent completion by staff Oral Cavity - Dentition: Poor condition;Adequate natural dentition Vision: Functional for self-feeding Self-Feeding Abilities: Needs assist Patient Positioning: Upright in chair Baseline Vocal Quality: Low vocal intensity Volitional Cough: Strong Volitional Swallow: Able to elicit    Oral/Motor/Sensory Function Overall Oral Motor/Sensory Function: Generalized oral weakness   Ice Chips Ice chips: Within functional limits Presentation: Spoon;Self Fed   Thin Liquid Thin Liquid: Within functional limits Presentation: Straw    Nectar Thick Nectar Thick Liquid: Not tested   Honey Thick Honey Thick Liquid: Not tested   Puree Puree: Impaired Presentation: Self Fed;Spoon Pharyngeal Phase Impairments: Multiple swallows   Solid     Solid: Impaired Oral Phase Impairments: Impaired mastication Oral Phase Functional Implications: Prolonged oral transit Pharyngeal Phase Impairments: Multiple swallows     Jesus Russell B. Quentin Ore, [facility], [place] Speech Language Pathologist Office: [phone] Pager: [phone]  Shonna Chock 02/26/2020,10:21  AM

## 2020-02-26 NOTE — Progress Notes (Signed)
Inpatient Rehab Admissions Coordinator:   Met with pt at the bedside to discuss goals and expectations of a CIR admit.  He is open to CIR, states I can call his sister, Vicente Males, to discuss home support.  I do not have any beds available for this pt today.  Given he was recently extubated, and with multiple intubations on this admission, will watch over the weekend and f/u with him on Monday.    Shann Medal, PT, DPT Admissions Coordinator [phone] 02/26/20  1:29 PM

## 2020-02-27 ENCOUNTER — Inpatient Hospital Stay (HOSPITAL_COMMUNITY): Payer: Medicare Other

## 2020-02-27 DIAGNOSIS — I10 Essential (primary) hypertension: Secondary | ICD-10-CM

## 2020-02-27 DIAGNOSIS — S22050K Wedge compression fracture of T5-T6 vertebra, subsequent encounter for fracture with nonunion: Secondary | ICD-10-CM

## 2020-02-27 DIAGNOSIS — Z7901 Long term (current) use of anticoagulants: Secondary | ICD-10-CM

## 2020-02-27 LAB — GLUCOSE, CAPILLARY
Glucose-Capillary: 110 mg/dL — ABNORMAL HIGH (ref 70–99)
Glucose-Capillary: 116 mg/dL — ABNORMAL HIGH (ref 70–99)
Glucose-Capillary: 153 mg/dL — ABNORMAL HIGH (ref 70–99)
Glucose-Capillary: 162 mg/dL — ABNORMAL HIGH (ref 70–99)
Glucose-Capillary: 75 mg/dL (ref 70–99)
Glucose-Capillary: 90 mg/dL (ref 70–99)

## 2020-02-27 LAB — CBC
HCT: 30.1 % — ABNORMAL LOW (ref 39.0–52.0)
Hemoglobin: 9.1 g/dL — ABNORMAL LOW (ref 13.0–17.0)
MCH: 33 pg (ref 26.0–34.0)
MCHC: 30.2 g/dL (ref 30.0–36.0)
MCV: 109.1 fL — ABNORMAL HIGH (ref 80.0–100.0)
Platelets: 376 10*3/uL (ref 150–400)
RBC: 2.76 MIL/uL — ABNORMAL LOW (ref 4.22–5.81)
RDW: 15.6 % — ABNORMAL HIGH (ref 11.5–15.5)
WBC: 12.2 10*3/uL — ABNORMAL HIGH (ref 4.0–10.5)
nRBC: 0.2 % (ref 0.0–0.2)

## 2020-02-27 LAB — BASIC METABOLIC PANEL
Anion gap: 9 (ref 5–15)
BUN: 32 mg/dL — ABNORMAL HIGH (ref 8–23)
CO2: 30 mmol/L (ref 22–32)
Calcium: 8.7 mg/dL — ABNORMAL LOW (ref 8.9–10.3)
Chloride: 105 mmol/L (ref 98–111)
Creatinine, Ser: 1.3 mg/dL — ABNORMAL HIGH (ref 0.61–1.24)
GFR calc Af Amer: >60 mL/min (ref >60)
GFR calc non Af Amer: 57 mL/min — ABNORMAL LOW (ref >60)
Glucose, Bld: 115 mg/dL — ABNORMAL HIGH (ref 70–99)
Potassium: 3.5 mmol/L (ref 3.5–5.1)
Sodium: 144 mmol/L (ref 135–145)

## 2020-02-27 LAB — PROTIME-INR
INR: 1.2 (ref 0.8–1.2)
Prothrombin Time: 15 s (ref 11.4–15.2)

## 2020-02-27 LAB — HEPARIN LEVEL (UNFRACTIONATED): Heparin Unfractionated: 0.33 IU/mL (ref 0.30–0.70)

## 2020-02-27 MED ORDER — WARFARIN - PHARMACIST DOSING INPATIENT: Freq: Every day | Status: DC

## 2020-02-27 MED ORDER — WARFARIN SODIUM 4 MG PO TABS
4.0000 mg | ORAL_TABLET | Freq: Once | ORAL | Status: AC
  Administered 2020-02-27: 4 mg via ORAL
  Filled 2020-02-27: qty 1

## 2020-02-27 NOTE — Progress Notes (Signed)
  Speech Language Pathology Treatment: Dysphagia  Patient Details Name: Jesus Russell MRN: [id number] DOB: 12-10-1951 Today's Date: 02/27/2020 Time: 1050-1100 SLP Time Calculation (min) (ACUTE ONLY): 10 min  Assessment / Plan / Recommendation Clinical Impression  Pt sitting in recliner, saturating well on room air, clearly deconditioned, but vocal quality is improved and he was able to self feed crackers, water (>3 ounces), mixing solid/liquids orally and swallowing with no s/s of aspiration. Recommend advancing diet to regular solids to optimize food choices and hopefully help with his appetite.  He will need assistance with tray set-up/prep due to weakness, but swallow function has improved significantly. No further SLP needs are identified - our service will sign off.   HPI HPI: Mr. [name] is a 69 year old male with a past medical history significant for positive lupus anticoagulant, PE (2012), DVT, IVC filter, COVID-19 infection on 1/13, HTN, hypertension, and sarcoidosis who presented to the ED with left lower chest/left upper quadrant abdominal pain.   Pt was intubated from 2/23-2/27. CXR on 2/27 reported "Bilateral airspace densities unchanged and appear chronic. No acute infiltrate or effusion."        SLP Plan  All goals met       Recommendations  Diet recommendations: Regular;Thin liquid Liquids provided via: Straw;Cup Medication Administration: Whole meds with liquid Supervision: Patient able to self feed                Oral Care Recommendations: Oral care BID SLP Visit Diagnosis: Dysphagia, unspecified (R13.10) Plan: All goals met       GO              Damika Harmon L. Tivis Ringer, [place] CCC/SLP Acute Rehabilitation Services Office number [phone] Pager [phone]   Juan Quam Laurice 02/27/2020, 11:48 AM

## 2020-02-27 NOTE — Progress Notes (Addendum)
[place] for heparin, warfarin Indication: pulmonary embolus  No Known Allergies  Patient Measurements: Height: _0  (172.7 cm) Weight: 219 lb 5.7 oz (99.5 kg) IBW/kg (Calculated) : 68.4 Heparin Dosing Weight: 94 kg  Vital Signs: Temp: 98.9 F (37.2 C) (03/06 0405) Temp Source: Axillary (03/06 0405) BP: 134/100 (03/06 0725) Pulse Rate: 99 (03/06 0913)  Labs: Recent Labs    02/25/20 0437 02/25/20 0437 02/26/20 0807 02/26/20 1753 02/27/20 0227  HGB 8.0*   < > 8.8*  --  9.1*  HCT 27.7*  --  30.1*  --  30.1*  PLT 301  --  400  --  376  LABPROT 15.6*  --  15.2  --  15.0  INR 1.3*  --  1.2  --  1.2  HEPARINUNFRC 0.45   < > 0.55 0.32 0.33  CREATININE 1.38*  --  1.30*  --  1.30*   < > = values in this interval not displayed.    Estimated Creatinine Clearance: 63.9 mL/min (A) (by C-G formula based on SCr of 1.3 mg/dL (H)).   Assessment: 69 yr old male presented with abdominal pain/SOB, hx COVID infection in January, now with bilateral PE on CT.  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  Noted new DVT and RV strain on echo. Heparin restarted 02/16/20. He is also s/p OR 3/3 with ENT for nasal bleeding. Packing is now removed and no further bleeding. Restarting warfarin on 3/6 -heparin level at goal -INR= 1.3  Home regimen: 4 mg Mon 2.5 AOD  Goal of Therapy:  Heparin level 0.3-0.5 units/ml Monitor platelets by anticoagulation protocol: Yes   Plan:  Continue Heparin at 900 units/hr Daily heparin level and CBC Coumadin 68m po x1 today  Daily PT/INR  AHildred Laser PharmD Clinical Pharmacist **Pharmacist phone directory can now be found on [email or web address] ([email or web address]).  Listed under [place]

## 2020-02-27 NOTE — Progress Notes (Signed)
PROGRESS NOTE    Jesus Russell  IDP:[id number] DOB: March 29, 1951 DOA: 02/14/2020 PCP: Jeanmarie Hubert, MD   Brief Narrative:  69 year old with history of Covid January 2021, sarcoidosis, antiphospholipid antibody syndrome, prior PE/DVT on Coumadin but temporarily discontinued in order to take naproxen admitted for acute PE complicated by epistaxis.  Need to be intubated, ENT was consulted.  Echocardiogram was suggestive of RV failure, had bilateral lower extremity DVTs.  Had to be intubated on 2/23 for airway protection and eventually extubated on 3/4.  Taken to the OR on 3/3 for nasal cautery.    Assessment & Plan:   Principal Problem:   Acute pulmonary embolism with acute cor pulmonale (HCC) Active Problems:   Sarcoidosis   Long term (current) use of anticoagulants   History of pulmonary embolism   Hypertension   Encounter for central line placement   Compression fracture of T6 vertebra (HCC)   Acute respiratory failure (HCC)   Shock circulatory (HCC)   Bleeding from the nose  Acute hypoxic respiratory failure Acute pulmonary embolism with cor pulmonale Aspiration complicated by epistaxis Bilateral small pleural effusion -Currently on heparin drip for 72 hours, plans to transition to p.o. on 3/6 if no further bleeding.  Will place pharmacy order to transition to Coumadin from heparin. -Nasal cautery performed by ENT.  Had to be intubated couple of times for airway protection. -Echocardiogram was suggestive of cor pulmonale.  Lower extremity Dopplers positive for bilateral DVTs -I-S/flutter, bronchodilators as needed. -Avoid use of nasal cannula  Diminished abdominal bowel sounds concerns for ileus -Abdominal x-ray.  Out of bed to chair.  History of antiphospholipid antibody -Complicated by thromboembolic disease, requires lifelong anticoagulation. -Transition heparin drip to Coumadin today.  Acute kidney injury with hypernatremia -ATN in the setting of shock.  Creatinine  1.3, sodium 144. -Status post D5 water.  History of sarcoidosis -Recently tapered off steroid.  Continue to monitor  Subacute T8 fracture -Supportive care  Speech recommendations-mild aspiration risk, dysphagia two diet, thin liquids PT-CIR   DVT prophylaxis: Heparin drip, bridged to Coumadin Code Status: Full code Family Communication:   Disposition Plan:   Patient From= home  Patient Anticipated D/C place= CIR  Barriers= transitioning to Coumadin, once therapeutic INR, will place patient to CIR.  In the meantime evaluate ileus.  Procedures:   CTA chest 2/21-pulmonary embolism-combination of occlusive/nonocclusive emboli  Echocardiogram 2/22-EF 60-65%, grade 1 DD, RV failure  Lower extremity Dopplers 2/23-bilateral lower extremity DVT   Subjective: Denies any complaints this morning.  When asked to take deep breaths, he did have lots of coughing but mainly nonproductive. Had nasal cannula in place which I removed.  Saturating greater than 95% on room air.  Review of Systems Otherwise negative except as per HPI, including: General: Denies fever, chills, night sweats or unintended weight loss. Resp: Denies hemoptysis Cardiac: Denies chest pain, palpitations, orthopnea, paroxysmal nocturnal dyspnea. GI: Denies abdominal pain, nausea, vomiting, diarrhea or constipation GU: Denies dysuria, frequency, hesitancy or incontinence MS: Denies muscle aches, joint pain or swelling Neuro: Denies headache, neurologic deficits (focal weakness, numbness, tingling), abnormal gait Psych: Denies anxiety, depression, SI/HI/AVH Skin: Denies new rashes or lesions ID: Denies sick contacts, exotic exposures, travel  Examination:  General exam: Appears calm and comfortable, chronically ill-appearing Respiratory system: Bilateral rhonchi Cardiovascular system: S1 & S2 heard, RRR. No JVD, murmurs, rubs, gallops or clicks. No pedal edema. Gastrointestinal system: Abdomen is nondistended, soft  and nontender. No organomegaly or masses felt.  Diminished bowel sounds Central nervous system: Alert  and oriented. No focal neurological deficits. Extremities: Symmetric 4 x 5 power. Skin: No rashes, lesions or ulcers Psychiatry: Judgement and insight appear normal. Mood & affect appropriate.     Objective: Vitals:   02/27/20 0725 02/27/20 0726 02/27/20 0729 02/27/20 0816  BP: (!) 134/100     Pulse: 90 93 90   Resp: (!) 22 (!) 24 (!) 22   Temp:      TempSrc:      SpO2: 100% 100% 100% 99%  Weight:      Height:        Intake/Output Summary (Last 24 hours) at 02/27/2020 0823 Last data filed at 02/27/2020 0740 Gross per 24 hour  Intake 2806.06 ml  Output 300 ml  Net 2506.06 ml   Filed Weights   02/24/20 0441 02/25/20 0454 02/26/20 0500  Weight: 98.9 kg 100.6 kg 99.5 kg     Data Reviewed:   CBC: Recent Labs  Lab 02/23/20 0255 02/24/20 0301 02/25/20 0437 02/26/20 0807 02/27/20 0227  WBC 13.0* 12.6* 11.0* 13.2* 12.2*  HGB 8.5* 8.3* 8.0* 8.8* 9.1*  HCT 28.1* 27.7* 27.7* 30.1* 30.1*  MCV 108.1* 111.2* 113.5* 111.5* 109.1*  PLT 276 286 301 400 242   Basic Metabolic Panel: Recent Labs  Lab 02/23/20 0255 02/24/20 0301 02/25/20 0437 02/26/20 0807 02/27/20 0227  NA 148* 149* 153* 153* 144  K 3.6 3.8 4.1 3.9 3.5  CL 103 104 109 110 105  CO2 36* 37* 38* 30 30  GLUCOSE 118* 99 122* 97 115*  BUN 47* 56* 49* 41* 32*  CREATININE 1.28* 1.18 1.38* 1.30* 1.30*  CALCIUM 8.9 8.8* 8.6* 9.1 8.7*   GFR: Estimated Creatinine Clearance: 63.9 mL/min (A) (by C-G formula based on SCr of 1.3 mg/dL (H)). Liver Function Tests: No results for input(s): AST, ALT, ALKPHOS, BILITOT, PROT, ALBUMIN in the last 168 hours. No results for input(s): LIPASE, AMYLASE in the last 168 hours. No results for input(s): AMMONIA in the last 168 hours. Coagulation Profile: Recent Labs  Lab 02/23/20 0255 02/24/20 0301 02/25/20 0437 02/26/20 0807 02/27/20 0227  INR 1.2 1.2 1.3* 1.2 1.2    Cardiac Enzymes: No results for input(s): CKTOTAL, CKMB, CKMBINDEX, TROPONINI in the last 168 hours. BNP (last 3 results) No results for input(s): PROBNP in the last 8760 hours. HbA1C: No results for input(s): HGBA1C in the last 72 hours. CBG: Recent Labs  Lab 02/26/20 1605 02/26/20 1946 02/27/20 0004 02/27/20 0317 02/27/20 0752  GLUCAP 166* 109* 153* 110* 162*   Lipid Profile: No results for input(s): CHOL, HDL, LDLCALC, TRIG, CHOLHDL, LDLDIRECT in the last 72 hours. Thyroid Function Tests: No results for input(s): TSH, T4TOTAL, FREET4, T3FREE, THYROIDAB in the last 72 hours. Anemia Panel: No results for input(s): VITAMINB12, FOLATE, FERRITIN, TIBC, IRON, RETICCTPCT in the last 72 hours. Sepsis Labs: No results for input(s): PROCALCITON, LATICACIDVEN in the last 168 hours.  Recent Results (from the past 240 hour(s))  MRSA PCR Screening     Status: None   Collection Time: 02/24/20  1:04 AM   Specimen: Nasal Mucosa; Nasopharyngeal  Result Value Ref Range Status   MRSA by PCR NEGATIVE NEGATIVE Final    Comment:        The GeneXpert MRSA Assay (FDA approved for NASAL specimens only), is one component of a comprehensive MRSA colonization surveillance program. It is not intended to diagnose MRSA infection nor to guide or monitor treatment for MRSA infections. Performed at [facility], [place] [address] [place], [place]  [place]          Radiology Studies: No results found.      Scheduled Meds: . arformoterol  15 mcg Nebulization BID  . budesonide (PULMICORT) nebulizer solution  0.5 mg Nebulization BID  . chlorhexidine  15 mL Mouth Rinse BID  . chlorhexidine gluconate (MEDLINE KIT)  15 mL Mouth Rinse BID  . Chlorhexidine Gluconate Cloth  6 each Topical Daily  . cholecalciferol  1,000 Units Oral Daily  . feeding supplement (ENSURE ENLIVE)  237 mL Oral TID BM  . insulin aspart  0-15 Units Subcutaneous Q4H  . mouth rinse  15 mL Mouth Rinse q12n4p  .  oxymetazoline  1 spray Each Nare BID  . pantoprazole  40 mg Oral Daily  . sodium chloride  2 spray Each Nare Q4H while awake  . sodium chloride flush  10-40 mL Intracatheter Q12H   Continuous Infusions: . sodium chloride Stopped (02/24/20 1303)  . dextrose 100 mL/hr at 02/26/20 1400  . heparin 900 Units/hr (02/27/20 0041)     LOS: 12 days   Time spent= 35 mins    Ileana Chalupa Arsenio Loader, MD [facility]  If 7PM-7AM, please contact night-coverage  02/27/2020, 8:23 AM

## 2020-02-27 NOTE — Progress Notes (Signed)
0232 Call placed to elink to report elevated b/p 138/101-104. Spoke to [employer] who will notify physician.

## 2020-02-27 NOTE — Plan of Care (Signed)
Plan of care reviewed with pt at bedside. Pt up with x2 assist to chair with walker. Abdominal xray obtained. VSS, denies pain. Heparin gtt infusing per orders. Alarms activated, fall bundle in place. Pt stable at this time, will continue to monitor.  Problem: Health Behavior/Discharge Planning: Goal: Ability to manage health-related needs will improve Outcome: Progressing   Problem: Clinical Measurements: Goal: Ability to maintain clinical measurements within normal limits will improve Outcome: Progressing Goal: Will remain free from infection Outcome: Progressing Goal: Diagnostic test results will improve Outcome: Progressing Goal: Respiratory complications will improve Outcome: Progressing Goal: Cardiovascular complication will be avoided Outcome: Progressing   Problem: Activity: Goal: Risk for activity intolerance will decrease Outcome: Progressing   Problem: Nutrition: Goal: Adequate nutrition will be maintained Outcome: Progressing   Problem: Coping: Goal: Level of anxiety will decrease Outcome: Progressing   Problem: Elimination: Goal: Will not experience complications related to bowel motility Outcome: Progressing Goal: Will not experience complications related to urinary retention Outcome: Progressing   Problem: Pain Managment: Goal: General experience of comfort will improve Outcome: Progressing   Problem: Safety: Goal: Ability to remain free from injury will improve Outcome: Progressing   Problem: Skin Integrity: Goal: Risk for impaired skin integrity will decrease Outcome: Progressing

## 2020-02-28 ENCOUNTER — Inpatient Hospital Stay (HOSPITAL_COMMUNITY): Payer: Medicare Other

## 2020-02-28 DIAGNOSIS — Z86711 Personal history of pulmonary embolism: Secondary | ICD-10-CM

## 2020-02-28 LAB — GLUCOSE, CAPILLARY
Glucose-Capillary: 176 mg/dL — ABNORMAL HIGH (ref 70–99)
Glucose-Capillary: 183 mg/dL — ABNORMAL HIGH (ref 70–99)
Glucose-Capillary: 39 mg/dL — CL (ref 70–99)
Glucose-Capillary: 51 mg/dL — ABNORMAL LOW (ref 70–99)
Glucose-Capillary: 61 mg/dL — ABNORMAL LOW (ref 70–99)
Glucose-Capillary: 64 mg/dL — ABNORMAL LOW (ref 70–99)
Glucose-Capillary: 64 mg/dL — ABNORMAL LOW (ref 70–99)
Glucose-Capillary: 65 mg/dL — ABNORMAL LOW (ref 70–99)
Glucose-Capillary: 66 mg/dL — ABNORMAL LOW (ref 70–99)
Glucose-Capillary: 72 mg/dL (ref 70–99)
Glucose-Capillary: 73 mg/dL (ref 70–99)
Glucose-Capillary: 82 mg/dL (ref 70–99)
Glucose-Capillary: 89 mg/dL (ref 70–99)
Glucose-Capillary: 97 mg/dL (ref 70–99)

## 2020-02-28 LAB — HEPARIN LEVEL (UNFRACTIONATED)
Heparin Unfractionated: 0.14 IU/mL — ABNORMAL LOW (ref 0.30–0.70)
Heparin Unfractionated: >2.2 IU/mL — ABNORMAL HIGH (ref 0.30–0.70)
Heparin Unfractionated: 0.32 IU/mL (ref 0.30–0.70)

## 2020-02-28 LAB — BASIC METABOLIC PANEL
Anion gap: 10 (ref 5–15)
BUN: 20 mg/dL (ref 8–23)
CO2: 24 mmol/L (ref 22–32)
Calcium: 8.6 mg/dL — ABNORMAL LOW (ref 8.9–10.3)
Chloride: 109 mmol/L (ref 98–111)
Creatinine, Ser: 1.01 mg/dL (ref 0.61–1.24)
GFR calc Af Amer: >60 mL/min (ref >60)
GFR calc non Af Amer: >60 mL/min (ref >60)
Glucose, Bld: 102 mg/dL — ABNORMAL HIGH (ref 70–99)
Potassium: 3.5 mmol/L (ref 3.5–5.1)
Sodium: 143 mmol/L (ref 135–145)

## 2020-02-28 LAB — CBC
HCT: 34.5 % — ABNORMAL LOW (ref 39.0–52.0)
Hemoglobin: 10.2 g/dL — ABNORMAL LOW (ref 13.0–17.0)
MCH: 33 pg (ref 26.0–34.0)
MCHC: 29.6 g/dL — ABNORMAL LOW (ref 30.0–36.0)
MCV: 111.7 fL — ABNORMAL HIGH (ref 80.0–100.0)
Platelets: 293 10*3/uL (ref 150–400)
RBC: 3.09 MIL/uL — ABNORMAL LOW (ref 4.22–5.81)
RDW: 15.4 % (ref 11.5–15.5)
WBC: 10.5 10*3/uL (ref 4.0–10.5)
nRBC: 0 % (ref 0.0–0.2)

## 2020-02-28 LAB — PROTIME-INR
INR: 1.2 (ref 0.8–1.2)
Prothrombin Time: 14.8 seconds (ref 11.4–15.2)

## 2020-02-28 MED ORDER — GLUCAGON HCL RDNA (DIAGNOSTIC) 1 MG IJ SOLR
1.0000 mg | Freq: Once | INTRAMUSCULAR | Status: AC | PRN
  Administered 2020-02-28: 1 mg via INTRAVENOUS
  Filled 2020-02-28: qty 1

## 2020-02-28 MED ORDER — DEXTROSE 50 % IV SOLN
INTRAVENOUS | Status: AC
  Administered 2020-02-28: 12.5 g via INTRAVENOUS
  Filled 2020-02-28: qty 50

## 2020-02-28 MED ORDER — DEXTROSE 50 % IV SOLN: 12.5000 g | INTRAVENOUS | Status: AC

## 2020-02-28 MED ORDER — WARFARIN SODIUM 4 MG PO TABS
4.0000 mg | ORAL_TABLET | Freq: Once | ORAL | Status: AC
  Administered 2020-02-28: 4 mg via ORAL
  Filled 2020-02-28: qty 1

## 2020-02-28 MED ORDER — DEXTROSE 10 % IV SOLN: INTRAVENOUS | Status: DC

## 2020-02-28 MED ORDER — ONDANSETRON HCL 4 MG/2ML IJ SOLN: 4.0000 mg | Freq: Three times a day (TID) | INTRAMUSCULAR | Status: DC | PRN

## 2020-02-28 MED ORDER — DEXTROSE 50 % IV SOLN
25.0000 g | INTRAVENOUS | Status: AC
  Administered 2020-02-28: 25 g via INTRAVENOUS
  Filled 2020-02-28: qty 50

## 2020-02-28 NOTE — Progress Notes (Signed)
ANTICOAGULATION CONSULT NOTE - Follow Up Consult  Pharmacy Consult for heparin Indication: PE/DVT  Labs: Recent Labs    02/26/20 0807 02/26/20 1753 02/27/20 0227 02/27/20 0227 02/28/20 0610 02/28/20 1639 02/28/20 2112  HGB 8.8*  --  9.1*  --  10.2*  --   --   HCT 30.1*  --  30.1*  --  34.5*  --   --   PLT 400  --  376  --  293  --   --   LABPROT 15.2  --  15.0  --  14.8  --   --   INR 1.2  --  1.2  --  1.2  --   --   HEPARINUNFRC 0.55   < > 0.33   < > 0.14* >2.20* 0.32  CREATININE 1.30*  --  1.30*  --  1.01  --   --    < > = values in this interval not displayed.    Assessment/Plan:  69yo male therapeutic on heparin after rate change. Will continue gtt at current rate and confirm stable with am labs.   Wynona Neat, PharmD, BCPS  02/28/2020,10:54 PM

## 2020-02-28 NOTE — Progress Notes (Signed)
PROGRESS NOTE    Jesus Russell  ERD:[id number] DOB: 1951/01/29 DOA: 02/14/2020 PCP: Jeanmarie Hubert, MD   Brief Narrative:  69 year old with history of Covid January 2021, sarcoidosis, antiphospholipid antibody syndrome, prior PE/DVT on Coumadin but temporarily discontinued in order to take naproxen admitted for acute PE complicated by epistaxis.  Need to be intubated, ENT was consulted.  Echocardiogram was suggestive of RV failure, had bilateral lower extremity DVTs.  Had to be intubated on 2/23 for airway protection and eventually extubated on 3/4.  Taken to the OR on 3/3 for nasal cautery.    Assessment & Plan:   Principal Problem:   Acute pulmonary embolism with acute cor pulmonale (HCC) Active Problems:   Sarcoidosis   Long term (current) use of anticoagulants   History of pulmonary embolism   Hypertension   Encounter for central line placement   Compression fracture of T6 vertebra (HCC)   Acute respiratory failure (HCC)   Shock circulatory (HCC)   Bleeding from the nose  Acute hypoxic respiratory failure Acute pulmonary embolism with cor pulmonale Aspiration complicated by epistaxis Bilateral small pleural effusion -Bridged from heparin to Coumadin. -Nasal cautery performed by ENT.  Had to be intubated couple of times for airway protection. -Echocardiogram was suggestive of cor pulmonale.  Lower extremity Dopplers positive for bilateral DVTs -I-S/flutter, bronchodilators as needed.  Aspiration precaution. -Avoid use of nasal cannula -Aspiration precautions  Diminished abdominal bowel sounds concerns for ileus -Out of bed to chair.  Abdominal x-ray negative.  History of antiphospholipid antibody -Complicated by thromboembolic disease, requires lifelong anticoagulation. -Heparin bridge to Coumadin, INR 1.2 this morning.  Acute kidney injury with hypernatremia -ATN in the setting of shock.  Creatinine 1.3, sodium 144. -Status post D5 water.  History of  sarcoidosis -Recently tapered off steroid.  Continue to monitor  Subacute T8 fracture -Supportive care  Speech recommendations-mild aspiration risk, dysphagia two diet, thin liquids PT-CIR   DVT prophylaxis: Heparin drip, bridged to Coumadin Code Status: Full code Family Communication:  Alinda Dooms, sister. No answer.  Disposition Plan:   Patient From= home  Patient Anticipated D/C place= CIR  Barriers= transitioning from heparin to Coumadin.  Once INR is therapeutic, will discontinue heparin drip and transfer him to CIR.  Procedures:   CTA chest 2/21-pulmonary embolism-combination of occlusive/nonocclusive emboli  Echocardiogram 2/22-EF 60-65%, grade 1 DD, RV failure  Lower extremity Dopplers 2/23-bilateral lower extremity DVT   Subjective: Some nonproductive coughing this morning otherwise no other complaints.  Review of Systems Otherwise negative except as per HPI, including: General = no fevers, chills, dizziness,  fatigue HEENT/EYES = negative for loss of vision, double vision, blurred vision,  sore throa Cardiovascular= negative for chest pain, palpitation Respiratory/lungs= negative for shortness of breath,  wheezing; hemoptysis,  Gastrointestinal= negative for nausea, vomiting, abdominal pain Genitourinary= negative for Dysuria MSK = Negative for arthralgia, myalgias Neurology= Negative for headache, numbness, tingling  Psychiatry= Negative for suicidal and homocidal ideation Skin= Negative for Rash   Examination: Constitutional: Not in acute distress, chronically ill-appearing Respiratory: Bilateral rhonchi Cardiovascular: Normal sinus rhythm, no rubs Abdomen: Nontender nondistended good bowel sounds Musculoskeletal: No edema noted, generally very weak with bilateral extremity strength 4/5 Skin: No rashes seen Neurologic: CN 2-12 grossly intact.  And nonfocal Psychiatric: Normal judgment and insight. Alert and oriented x 3. Normal mood.     Objective: Vitals:   02/28/20 0010 02/28/20 0349 02/28/20 0726 02/28/20 0730  BP: 129/84 129/89  126/84  Pulse: 89 78  88  Resp: (!) 23  20  (!) 25  Temp: 98.2 F (36.8 C) 98.4 F (36.9 C)  97.6 F (36.4 C)  TempSrc: Axillary Axillary  Oral  SpO2: 94% 100% 98% 100%  Weight:  101 kg    Height:        Intake/Output Summary (Last 24 hours) at 02/28/2020 1056 Last data filed at 02/28/2020 0300 Gross per 24 hour  Intake 100 ml  Output 1275 ml  Net -1175 ml   Filed Weights   02/25/20 0454 02/26/20 0500 02/28/20 0349  Weight: 100.6 kg 99.5 kg 101 kg     Data Reviewed:   CBC: Recent Labs  Lab 02/24/20 0301 02/25/20 0437 02/26/20 0807 02/27/20 0227 02/28/20 0610  WBC 12.6* 11.0* 13.2* 12.2* 10.5  HGB 8.3* 8.0* 8.8* 9.1* 10.2*  HCT 27.7* 27.7* 30.1* 30.1* 34.5*  MCV 111.2* 113.5* 111.5* 109.1* 111.7*  PLT 286 301 400 376 889   Basic Metabolic Panel: Recent Labs  Lab 02/24/20 0301 02/25/20 0437 02/26/20 0807 02/27/20 0227 02/28/20 0610  NA 149* 153* 153* 144 143  K 3.8 4.1 3.9 3.5 3.5  CL 104 109 110 105 109  CO2 37* 38* _0 GLUCOSE 99 122* 97 115* 102*  BUN 56* 49* 41* 32* 20  CREATININE 1.18 1.38* 1.30* 1.30* 1.01  CALCIUM 8.8* 8.6* 9.1 8.7* 8.6*   GFR: Estimated Creatinine Clearance: 82.8 mL/min (by C-G formula based on SCr of 1.01 mg/dL). Liver Function Tests: No results for input(s): AST, ALT, ALKPHOS, BILITOT, PROT, ALBUMIN in the last 168 hours. No results for input(s): LIPASE, AMYLASE in the last 168 hours. No results for input(s): AMMONIA in the last 168 hours. Coagulation Profile: Recent Labs  Lab 02/24/20 0301 02/25/20 0437 02/26/20 0807 02/27/20 0227 02/28/20 0610  INR 1.2 1.3* 1.2 1.2 1.2   Cardiac Enzymes: No results for input(s): CKTOTAL, CKMB, CKMBINDEX, TROPONINI in the last 168 hours. BNP (last 3 results) No results for input(s): PROBNP in the last 8760 hours. HbA1C: No results for input(s): HGBA1C in the last 72  hours. CBG: Recent Labs  Lab 02/28/20 0007 02/28/20 0347 02/28/20 0423 02/28/20 0454 02/28/20 0731  GLUCAP 89 66* 64* 176* 82   Lipid Profile: No results for input(s): CHOL, HDL, LDLCALC, TRIG, CHOLHDL, LDLDIRECT in the last 72 hours. Thyroid Function Tests: No results for input(s): TSH, T4TOTAL, FREET4, T3FREE, THYROIDAB in the last 72 hours. Anemia Panel: No results for input(s): VITAMINB12, FOLATE, FERRITIN, TIBC, IRON, RETICCTPCT in the last 72 hours. Sepsis Labs: No results for input(s): PROCALCITON, LATICACIDVEN in the last 168 hours.  Recent Results (from the past 240 hour(s))  MRSA PCR Screening     Status: None   Collection Time: 02/24/20  1:04 AM   Specimen: Nasal Mucosa; Nasopharyngeal  Result Value Ref Range Status   MRSA by PCR NEGATIVE NEGATIVE Final    Comment:        The GeneXpert MRSA Assay (FDA approved for NASAL specimens only), is one component of a comprehensive MRSA colonization surveillance program. It is not intended to diagnose MRSA infection nor to guide or monitor treatment for MRSA infections. Performed at [facility], [place] [address] [place], [place] [address]          Radiology Studies: DG Abd 1 View  Result Date: 02/27/2020 CLINICAL DATA:  Abdominal distension. EXAM: ABDOMEN - 1 VIEW COMPARISON:  02/21/2020 FINDINGS: Only the upper abdomen and lower chest are included on a single image. The visualized bowel gas pattern is normal.  Stable inferior vena cava filter with its tip at the upper L2 level. Previously noted bilateral lung opacities. The previously seen enteric tube is no longer visualized. Unremarkable bones. IMPRESSION: No acute abnormality. Electronically Signed   By: Claudie Revering M.D.   On: 02/27/2020 13:56   DG Chest Port 1 View  Result Date: 02/28/2020 CLINICAL DATA:  Shortness of breath.  History of sarcoidosis. EXAM: PORTABLE CHEST 1 VIEW COMPARISON:  02/24/2020; 02/23/2020; 10/06/2019; chest CT-02/14/2020 FINDINGS:  Interval extubation and removal of enteric tube.  No pneumothorax. Grossly unchanged cardiac silhouette and mediastinal contours with atherosclerotic plaque within the thoracic aorta given reduced lung volumes. There is mild rightward deviation of the tracheal air column, potentially accentuated due to positioning. Rather extensive peripheral predominant interstitial opacities and architectural distortion are unchanged. Grossly unchanged trace pleural effusions. No definitive new focal airspace opacities. No evidence of edema. No acute osseous abnormalities. Degenerative change the bilateral chromium clavicular joints is suspected though incompletely evaluated. IMPRESSION: 1. Interval extubation removal of enteric tube.  No pneumothorax. 2. Slightly reduced lung volumes with grossly unchanged rather extensive bilateral interstitial opacities and architectural distortion, compatible provided history of sarcoidosis. Electronically Signed   By: Sandi Mariscal M.D.   On: 02/28/2020 09:32        Scheduled Meds: . arformoterol  15 mcg Nebulization BID  . budesonide (PULMICORT) nebulizer solution  0.5 mg Nebulization BID  . chlorhexidine  15 mL Mouth Rinse BID  . chlorhexidine gluconate (MEDLINE KIT)  15 mL Mouth Rinse BID  . Chlorhexidine Gluconate Cloth  6 each Topical Daily  . cholecalciferol  1,000 Units Oral Daily  . feeding supplement (ENSURE ENLIVE)  237 mL Oral TID BM  . insulin aspart  0-15 Units Subcutaneous Q4H  . mouth rinse  15 mL Mouth Rinse q12n4p  . oxymetazoline  1 spray Each Nare BID  . pantoprazole  40 mg Oral Daily  . sodium chloride  2 spray Each Nare Q4H while awake  . sodium chloride flush  10-40 mL Intracatheter Q12H  . Warfarin - Pharmacist Dosing Inpatient   Does not apply q1800   Continuous Infusions: . sodium chloride Stopped (02/24/20 1303)  . heparin 1,050 Units/hr (02/28/20 0916)     LOS: 13 days   Time spent= 35 mins    Lindalou Soltis Arsenio Loader, MD [facility]  If 7PM-7AM, please contact night-coverage  02/28/2020, 10:56 AM

## 2020-02-28 NOTE — Progress Notes (Signed)
[facility] for heparin, warfarin Indication: pulmonary embolus  No Known Allergies  Patient Measurements: Height: 5' 8" (172.7 cm) Weight: 222 lb 10.6 oz (101 kg) IBW/kg (Calculated) : 68.4 Heparin Dosing Weight: 94 kg  Vital Signs: Temp: 97.6 F (36.4 C) (03/07 0730) Temp Source: Oral (03/07 0730) BP: 126/84 (03/07 0730) Pulse Rate: 88 (03/07 0730)  Labs: Recent Labs    02/26/20 0807 02/26/20 0807 02/26/20 1753 02/27/20 0227 02/28/20 0610  HGB 8.8*   < >  --  9.1* 10.2*  HCT 30.1*  --   --  30.1* 34.5*  PLT 400  --   --  376 293  LABPROT 15.2  --   --  15.0 14.8  INR 1.2  --   --  1.2 1.2  HEPARINUNFRC 0.55   < > 0.32 0.33 0.14*  CREATININE 1.30*  --   --  1.30* 1.01   < > = values in this interval not displayed.    Estimated Creatinine Clearance: 82.8 mL/min (by C-G formula based on SCr of 1.01 mg/dL).   Assessment: 69 yr old male presented with abdominal pain/SOB, hx COVID infection in January, now with bilateral PE on CT.  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  Noted new DVT and RV strain on echo. Heparin restarted 02/16/20. He is also s/p OR 3/3 with ENT for nasal bleeding. Packing is now removed and no further bleeding. Restarted warfarin on 3/6 -heparin level = 0.14 -INR= 1.  Home regimen: 4 mg Mon 2.5 AOD  Goal of Therapy:  Heparin level 0.3-0.5 units/ml Monitor platelets by anticoagulation protocol: Yes   Plan:  Increase heparin to 1050 units/hr Heparin level in 8 hours and daily wth CBC daily Coumadin 21m po x1 today  Daily PT/INR  AHildred Laser PharmD Clinical Pharmacist **Pharmacist phone directory can now be found on [facility] ([email or web address]).  Listed under [place]

## 2020-02-28 NOTE — Progress Notes (Signed)
Pt's CBG was 65 prior to dinner. RN gave pt 1 apple juice. Follow CBG was 64. RN gave pt 2 grape juices. Follow up CBG was 61. RN reported findings to [facility] MD. Due to MD starting pt on D10 maintenance fluids a few hours ago  with little improvement, Amin MD gave verbal order for RN to administer 1 mg of glucagon. Once medication administered, pt's dinner showed up. RN fed pt mash potatoes, [place] with gravy, and pears. Pt finished 15-25% of meal before reporting to RN that he felt nauseas. RN rechecked pt's CBG - it was 73. RN reported findings to pt's NT so that both NTs and RNs can report findings off to upcoming shift.   RN will continue to monitor pt for the time being.

## 2020-02-28 NOTE — Progress Notes (Signed)
1282 Blood sugar retake after D12.5 is 176, will cont to monitor.

## 2020-02-28 NOTE — Progress Notes (Signed)
[facility] for heparin, warfarin Indication: pulmonary embolus  No Known Allergies  Patient Measurements: Height: 5' 8" (172.7 cm) Weight: 222 lb 10.6 oz (101 kg) IBW/kg (Calculated) : 68.4 Heparin Dosing Weight: 94 kg  Vital Signs: Temp: 97.6 F (36.4 C) (03/07 0730) Temp Source: Oral (03/07 0730) BP: 147/97 (03/07 1608) Pulse Rate: 95 (03/07 1623)  Labs: Recent Labs    02/26/20 0807 02/26/20 1753 02/27/20 0227 02/28/20 0610 02/28/20 1639  HGB 8.8*  --  9.1* 10.2*  --   HCT 30.1*  --  30.1* 34.5*  --   PLT 400  --  376 293  --   LABPROT 15.2  --  15.0 14.8  --   INR 1.2  --  1.2 1.2  --   HEPARINUNFRC 0.55   < > 0.33 0.14* >2.20*  CREATININE 1.30*  --  1.30* 1.01  --    < > = values in this interval not displayed.    Estimated Creatinine Clearance: 82.8 mL/min (by C-G formula based on SCr of 1.01 mg/dL).   Assessment: 69 yr old male presented with abdominal pain/SOB, hx COVID infection in January, now with bilateral PE on CT.  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  Noted new DVT and RV strain on echo. Heparin restarted 02/16/20. He is also s/p OR 3/3 with ENT for nasal bleeding. Packing is now removed and no further bleeding. Restarted warfarin on 3/6 -heparin level = 0.32 -INR= 1  Home regimen: 4 mg Mon 2.5 AOD  Goal of Therapy:  Heparin level 0.3-0.5 units/ml Monitor platelets by anticoagulation protocol: Yes   Plan:  Continue heparin at 1050 units/hr Daily PT/INR with heparin level and CBC  Erin Hearing PharmD., BCPS Clinical Pharmacist 02/28/2020 7:26 PM

## 2020-02-28 NOTE — Progress Notes (Addendum)
Blood sugar 66. Per hypoglycemia protocol, juice given. Blood sugar on retake fell to 64. D12.5 given per hypoglycemia protocol.   Per PCCM, they have s/o and are now under hospitalist care. On call hospitalist Bodenheimer paged re: updates of CBG and interventions, and request for guidance and intervention.

## 2020-02-29 LAB — CBC
HCT: 29.9 % — ABNORMAL LOW (ref 39.0–52.0)
Hemoglobin: 9.1 g/dL — ABNORMAL LOW (ref 13.0–17.0)
MCH: 32.7 pg (ref 26.0–34.0)
MCHC: 30.4 g/dL (ref 30.0–36.0)
MCV: 107.6 fL — ABNORMAL HIGH (ref 80.0–100.0)
Platelets: 258 10*3/uL (ref 150–400)
RBC: 2.78 MIL/uL — ABNORMAL LOW (ref 4.22–5.81)
RDW: 14.9 % (ref 11.5–15.5)
WBC: 10.2 10*3/uL (ref 4.0–10.5)
nRBC: 0 % (ref 0.0–0.2)

## 2020-02-29 LAB — BRAIN NATRIURETIC PEPTIDE: B Natriuretic Peptide: 111.4 pg/mL — ABNORMAL HIGH (ref 0.0–100.0)

## 2020-02-29 LAB — PROCALCITONIN: Procalcitonin: 0.18 ng/mL

## 2020-02-29 LAB — GLUCOSE, CAPILLARY
Glucose-Capillary: 103 mg/dL — ABNORMAL HIGH (ref 70–99)
Glucose-Capillary: 104 mg/dL — ABNORMAL HIGH (ref 70–99)
Glucose-Capillary: 109 mg/dL — ABNORMAL HIGH (ref 70–99)
Glucose-Capillary: 45 mg/dL — ABNORMAL LOW (ref 70–99)
Glucose-Capillary: 47 mg/dL — ABNORMAL LOW (ref 70–99)
Glucose-Capillary: 52 mg/dL — ABNORMAL LOW (ref 70–99)
Glucose-Capillary: 66 mg/dL — ABNORMAL LOW (ref 70–99)
Glucose-Capillary: 81 mg/dL (ref 70–99)
Glucose-Capillary: 92 mg/dL (ref 70–99)
Glucose-Capillary: 92 mg/dL (ref 70–99)
Glucose-Capillary: 95 mg/dL (ref 70–99)

## 2020-02-29 LAB — BASIC METABOLIC PANEL
Anion gap: 9 (ref 5–15)
BUN: 12 mg/dL (ref 8–23)
CO2: 29 mmol/L (ref 22–32)
Calcium: 8.4 mg/dL — ABNORMAL LOW (ref 8.9–10.3)
Chloride: 105 mmol/L (ref 98–111)
Creatinine, Ser: 0.99 mg/dL (ref 0.61–1.24)
GFR calc Af Amer: >60 mL/min (ref >60)
GFR calc non Af Amer: >60 mL/min (ref >60)
Glucose, Bld: 111 mg/dL — ABNORMAL HIGH (ref 70–99)
Potassium: 3.1 mmol/L — ABNORMAL LOW (ref 3.5–5.1)
Sodium: 143 mmol/L (ref 135–145)

## 2020-02-29 LAB — HEPARIN LEVEL (UNFRACTIONATED): Heparin Unfractionated: 0.29 IU/mL — ABNORMAL LOW (ref 0.30–0.70)

## 2020-02-29 LAB — PROTIME-INR
INR: 1.3 — ABNORMAL HIGH (ref 0.8–1.2)
Prothrombin Time: 16.2 seconds — ABNORMAL HIGH (ref 11.4–15.2)

## 2020-02-29 LAB — TSH: TSH: 1.613 u[IU]/mL (ref 0.350–4.500)

## 2020-02-29 LAB — VITAMIN B12: Vitamin B-12: 374 pg/mL (ref 180–914)

## 2020-02-29 MED ORDER — FUROSEMIDE 10 MG/ML IJ SOLN
40.0000 mg | Freq: Once | INTRAMUSCULAR | Status: AC
  Administered 2020-02-29: 40 mg via INTRAVENOUS
  Filled 2020-02-29: qty 4

## 2020-02-29 MED ORDER — WARFARIN SODIUM 5 MG PO TABS
5.0000 mg | ORAL_TABLET | Freq: Once | ORAL | Status: AC
  Administered 2020-02-29: 5 mg via ORAL
  Filled 2020-02-29: qty 1

## 2020-02-29 MED ORDER — DEXTROSE 50 % IV SOLN
50.0000 mL | Freq: Once | INTRAVENOUS | Status: AC
  Administered 2020-02-29: 50 mL via INTRAVENOUS

## 2020-02-29 MED ORDER — AZITHROMYCIN 250 MG PO TABS
500.0000 mg | ORAL_TABLET | ORAL | Status: DC
  Administered 2020-02-29: 500 mg via ORAL
  Filled 2020-02-29 (×3): qty 2

## 2020-02-29 MED ORDER — PREDNISONE 20 MG PO TABS
20.0000 mg | ORAL_TABLET | Freq: Every day | ORAL | Status: DC
  Administered 2020-03-01: 20 mg via ORAL
  Filled 2020-02-29 (×2): qty 1

## 2020-02-29 MED ORDER — GLUCAGON HCL RDNA (DIAGNOSTIC) 1 MG IJ SOLR
1.0000 mg | INTRAMUSCULAR | Status: DC | PRN
  Administered 2020-02-29: 1 mg via INTRAVENOUS
  Filled 2020-02-29: qty 1

## 2020-02-29 MED ORDER — POTASSIUM CHLORIDE 10 MEQ/100ML IV SOLN
10.0000 meq | INTRAVENOUS | Status: DC
  Administered 2020-02-29 (×6): 10 meq via INTRAVENOUS
  Filled 2020-02-29 (×3): qty 100

## 2020-02-29 MED ORDER — IPRATROPIUM-ALBUTEROL 0.5-2.5 (3) MG/3ML IN SOLN
3.0000 mL | Freq: Three times a day (TID) | RESPIRATORY_TRACT | Status: DC
  Administered 2020-02-29 (×2): 3 mL via RESPIRATORY_TRACT
  Filled 2020-02-29 (×5): qty 3

## 2020-02-29 MED ORDER — POTASSIUM CHLORIDE 10 MEQ/100ML IV SOLN: 10.0000 meq | INTRAVENOUS | Status: AC

## 2020-02-29 MED ORDER — ENOXAPARIN SODIUM 100 MG/ML ~~LOC~~ SOLN
1.0000 mg/kg | Freq: Two times a day (BID) | SUBCUTANEOUS | Status: DC
  Administered 2020-02-29 – 2020-03-01 (×3): 100 mg via SUBCUTANEOUS
  Filled 2020-02-29 (×3): qty 1

## 2020-02-29 NOTE — TOC Progression Note (Addendum)
Transition of Care [facility]) - Progression Note    Patient Details  Name: Jacy Howat MRN: [id number] Date of Birth: 1951/08/29  Transition of [facility]) CM/SW Contact  Maryclare Labrador, RN Phone Number: 02/29/2020, 12:05 PM  Clinical Narrative:   Pt currently unable to discharge to CIR as pt still requiring venturi mask with 10 liters.  CM discussed LTACH with attending as facilities can accept pt on venturi mask.  Pt needs high level oxygen and unfortunately can not use [place] dud to hx of severe nose bleed requiring cauterization in the OR.  Attending in agreement with LTACH - verbal order given.  CM gave referral to both [facility] and [facility] - both facilities can provide bed to pt today.  CM confirmed with bedside nurse that pt is oriented however has periods of confusion. CM contacted pt via phone - pt alert and oriented ad declined for CM to reach out to his sister.   CM discussed LTACH referral with pt - pt would like liaisons to call him today to discuss process prior to discussing it with his sister.    Update:  CM spoke with pt post discussions with [place] and he would like to discuss options further with family tonight.  TOC will follow up with pt in the am regarding choice.         Expected Discharge Plan and Services                                                 Social Determinants of Health (SDOH) Interventions    Readmission Risk Interventions No flowsheet data found.

## 2020-02-29 NOTE — Progress Notes (Signed)
Physical Therapy Treatment Patient Details Name: Jesus Russell MRN: [id number] DOB: 1951/05/26 Today's Date: 02/29/2020    History of Present Illness 69 yo admitted 02/15/20 with abdominal pain with acute PE 2/21, 2/22 epistaxis, pt intubated 2/23-2/27. Pt reintubated 2/28 with nosebleed and still on vent on re0eval 3/4.  Returned to OR on 02/24/20 due to nasal bleeding. PMhx: Pt seen by P.T. 01/25/20 due to T6 compression fx, Lupus, covid 1/13, HTN, sarcoidosis    PT Comments    Pt found in bed with Venturi Mask off of his nose, on 10 LO2 with SaO2 92%O2. Assisted pt with finding [place] for phlegm and replaced mask and SaO2 increased to 98%O2. Pt reports his blood sugar is low and he does not have the energy for getting up to the EoB however agreeable to therapeutic exercise with bed in chair position. Pt with 1 bout of respiration 45 bpm with cuing to deep breathing able to return to 25bpm. Per notes pt will not be accepted in CIR due to increased O2 demand and is now being recommended for LTACH. PT in agreement with that recommendation. PT will continue to follow acutely.     Follow Up Recommendations  LTACH     Equipment Recommendations  Rolling walker with 5" wheels;Wheelchair (measurements PT);3in1 (PT)    Recommendations for Other Services Rehab consult     Precautions / Restrictions Precautions Precautions: Fall Precaution Comments: Venturi mask with 10LO2 Restrictions Weight Bearing Restrictions: No    Mobility  Bed Mobility               General bed mobility comments: pt refuses mobility due to increased O2 demand and low blood sugar        Balance Overall balance assessment: Needs assistance Sitting-balance support: Feet supported;Bilateral upper extremity supported;No upper extremity supported Sitting balance-Leahy Scale: Fair Sitting balance - Comments: Pt with lean to right initially and needing min guard assist to sit.   Standing balance support: Bilateral  upper extremity supported;During functional activity Standing balance-Leahy Scale: Poor Standing balance comment: requires +2 min to mod assist and RW , fatigues quickly in standing                            Cognition Arousal/Alertness: Awake/alert Behavior During Therapy: WFL for tasks assessed/performed Overall Cognitive Status: Difficult to assess Area of Impairment: Safety/judgement;Problem solving;Following commands                       Following Commands: Follows one step commands consistently Safety/Judgement: Decreased awareness of safety;Decreased awareness of deficits   Problem Solving: Slow processing;Requires verbal cues;Requires tactile cues General Comments: following commands well      Exercises General Exercises - Lower Extremity Ankle Circles/Pumps: AROM;Both;10 reps;Supine Quad Sets: AROM;Both;10 reps Long Arc Quad: Both;Seated;10 reps;AROM Hip ABduction/ADduction: AROM;Both;10 reps;Seated Straight Leg Raises: Both;Supine;AAROM Hip Flexion/Marching: Both;AAROM;10 reps;Seated        Pertinent Vitals/Pain Pain Assessment: Faces Faces Pain Scale: Hurts little more Pain Location: "discomfort all over" Pain Descriptors / Indicators: Discomfort Pain Intervention(s): Limited activity within patient's tolerance;Monitored during session;Repositioned           PT Goals (current goals can now be found in the care plan section) Acute Rehab PT Goals Patient Stated Goal: pt agreeable to working with therapies PT Goal Formulation: With patient Time For Goal Achievement: 03/06/20 Potential to Achieve Goals: Fair Progress towards PT goals: Not progressing toward goals - comment(limited by  low blood sugar)    Frequency    Min 3X/week      PT Plan Discharge plan needs to be updated       AM-PAC PT "6 Clicks" Mobility   Outcome Measure  Help needed turning from your back to your side while in a flat bed without using bedrails?: A  Little Help needed moving from lying on your back to sitting on the side of a flat bed without using bedrails?: A Little Help needed moving to and from a bed to a chair (including a wheelchair)?: A Lot Help needed standing up from a chair using your arms (e.g., wheelchair or bedside chair)?: A Lot Help needed to walk in hospital room?: Total Help needed climbing 3-5 steps with a railing? : Total 6 Click Score: 12    End of Session Equipment Utilized During Treatment: Oxygen Activity Tolerance: Patient tolerated treatment well;Patient limited by fatigue Patient left: with call bell/phone within reach;with chair alarm set;in bed(pt in 60 degree chair position) Nurse Communication: Mobility status PT Visit Diagnosis: Muscle weakness (generalized) (M62.81);Difficulty in walking, not elsewhere classified (R26.2);Other abnormalities of gait and mobility (R26.89)     Time: 3552-1747 PT Time Calculation (min) (ACUTE ONLY): 16 min  Charges:  $Therapeutic Exercise: 8-22 mins                     Jinelle Butchko B. Migdalia Dk PT, DPT Acute Rehabilitation Services Pager [phone] Office [phone]    [place] 02/29/2020, 4:41 PM

## 2020-02-29 NOTE — Progress Notes (Signed)
Inpatient Rehab Admissions Coordinator:   Met with pt at the bedside and discussed with RN.  Pt requiring accessory muscles to breath, dyspneic at rest.  Requiring venti-mask and 10L O2, without which he dropped to 80s.  Discussed with CM, possibility of LTACH placement.  Will follow for now, until dispo confirmed.   Shann Medal, PT, DPT Admissions Coordinator [phone] 02/29/20  1:11 PM

## 2020-02-29 NOTE — Progress Notes (Addendum)
ANTICOAGULATION CONSULT NOTE  Pharmacy Consult for heparin, warfarin Indication: pulmonary embolus  No Known Allergies  Patient Measurements: Height: 5' 8" (172.7 cm) Weight: 222 lb 10.6 oz (101 kg) IBW/kg (Calculated) : 68.4 Heparin Dosing Weight: 94 kg  Vital Signs: Temp: 98.2 F (36.8 C) (03/08 0817) Temp Source: Oral (03/08 0817) BP: 127/82 (03/08 0411) Pulse Rate: 80 (03/08 0500)  Labs: Recent Labs    02/27/20 0227 02/27/20 0227 02/28/20 0610 02/28/20 0610 02/28/20 1639 02/28/20 2112 02/29/20 0635  HGB 9.1*   < > 10.2*  --   --   --  9.1*  HCT 30.1*  --  34.5*  --   --   --  29.9*  PLT 376  --  293  --   --   --  258  LABPROT 15.0  --  14.8  --   --   --  16.2*  INR 1.2  --  1.2  --   --   --  1.3*  HEPARINUNFRC 0.33   < > 0.14*   < > >2.20* 0.32 0.29*  CREATININE 1.30*  --  1.01  --   --   --  0.99   < > = values in this interval not displayed.    Estimated Creatinine Clearance: 84.5 mL/min (by C-G formula based on SCr of 0.99 mg/dL).   Assessment: 69 yr old male presented with abdominal pain/SOB, hx COVID infection in January, now with bilateral PE on CT.  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  Noted new DVT and RV strain on echo. Heparin restarted 02/16/20. He is also s/p OR 3/3 with ENT for nasal bleeding. Packing is now removed and no further bleeding. Restarted warfarin on 3/6 -heparin level = 0.29 - INR 1.3  Home regimen: 4 mg Mon 2.5 AOD  Goal of Therapy:  INR 2 - 3 Monitor platelets by anticoagulation protocol: Yes   Plan:  Dc heparin gtt Convert to enox 1 mg/kg q12h Warfarin 5 mg x 1 Daily inr cbc  F/u dc enox when INR > 2  Barth Kirks, PharmD, BCPS, BCCCP Clinical Pharmacist [phone]  Please check AMION for all [place] numbers  02/29/2020 9:35 AM

## 2020-02-29 NOTE — Progress Notes (Signed)
Hypoglycemic Event  CBG: 66  Treatment: 4 oz juice/soda  Symptoms: None   CBG Result:104  Possible Reasons for Event: Unknown  Comments/MD notified:Kirby, NP notified    [facility]

## 2020-02-29 NOTE — Progress Notes (Addendum)
PROGRESS NOTE    Jesus Russell  OBS:[id number] DOB: 04-12-1951 DOA: 02/14/2020 PCP: Jeanmarie Hubert, MD   Brief Narrative:  69 year old with history of Covid January 2021, sarcoidosis, antiphospholipid antibody syndrome, prior PE/DVT on Coumadin but temporarily discontinued in order to take naproxen admitted for acute PE complicated by epistaxis.  Need to be intubated, ENT was consulted.  Echocardiogram was suggestive of RV failure, had bilateral lower extremity DVTs.  Had to be intubated on 2/23 for airway protection and eventually extubated on 3/4.  Taken to the OR on 3/3 for nasal cautery.    Assessment & Plan:   Principal Problem:   Acute pulmonary embolism with acute cor pulmonale (HCC) Active Problems:   Sarcoidosis   Long term (current) use of anticoagulants   History of pulmonary embolism   Hypertension   Encounter for central line placement   Compression fracture of T6 vertebra (HCC)   Acute respiratory failure (HCC)   Shock circulatory (HCC)   Bleeding from the nose  Acute hypoxic respiratory failure Acute pulmonary embolism with cor pulmonale Aspiration complicated by epistaxis Bilateral small pleural effusion -Due to limited IV access, will discontinue heparin.  To Lovenox to Coumadin bridge instead. -Nasal cautery performed by ENT.  Had to be intubated couple of times for airway protection. -Echocardiogram was suggestive of cor pulmonale.  Lower extremity Dopplers positive for bilateral DVTs -I-S/flutter, aspiration precaution -Aggressive bronchodilators -Daily prednisone 20 mg daily, azithromycin Monday Wednesday Friday. -Avoid use of nasal cannula -Aspiration precautions.  Procalcitonin 0.18 -Lasix 40 mg IV once  Diminished abdominal bowel sounds concerns for ileus -Back to chair as possible  Recurrent hypoglycemia -Failed multiple treatments despite getting amp of D50, D5.  Currently he is on D10 infusion.  Will give glucagon.  History of antiphospholipid  antibody -Complicated by thromboembolic disease, requires lifelong anticoagulation. -Heparin bridge to Coumadin, INR 1.3  Acute kidney injury with hypernatremia -Resolved  History of sarcoidosis -Currently back on prednisone 20 mg daily.  Continue to monitor.  Subacute T8 fracture -Supportive care  Macrocytic anemia -Check TSH, B12 and folate  Speech recommendations-mild aspiration risk, dysphagia two diet, thin liquids PT-CIR  Spoke with Education officer, museum, considering LTAC at this time once his breathing and blood glucose has stabilized.  DVT prophylaxis: Heparin drip, bridged to Coumadin Code Status: Full code Family Communication: No answer Disposition Plan:   Patient From= home  Patient Anticipated D/C place= CIR versus LTAC  Barriers= transition Lovenox to Coumadin.  In the meantime awaiting breathing symptoms to improve.  Transition to LTAC versus CIR  Procedures:   CTA chest 2/21-pulmonary embolism-combination of occlusive/nonocclusive emboli  Echocardiogram 2/22-EF 60-65%, grade 1 DD, RV failure  Lower extremity Dopplers 2/23-bilateral lower extremity DVT   Subjective: Still continues to have dyspnea with minimal exertion and nonproductive cough. Multiple episodes of hypoglycemia yesterday despite of getting D5 and then D10 infusion requirement glucagon.  Review of Systems Otherwise negative except as per HPI, including: General = no fevers, chills, dizziness,  fatigue HEENT/EYES = negative for loss of vision, double vision, blurred vision,  sore throa Cardiovascular= negative for chest pain, palpitation Respiratory/lungs= negative for shortness of breath, cough, wheezing; hemoptysis,  Gastrointestinal= negative for nausea, vomiting, abdominal pain Genitourinary= negative for Dysuria MSK = Negative for arthralgia, myalgias Neurology= Negative for headache, numbness, tingling  Psychiatry= Negative for suicidal and homocidal ideation Skin= Negative for  Rash  Examination: Constitutional: Venturi mask.  Appears chronically ill Respiratory: Bilateral diffuse wheezing with rhonchorous breath sounds Cardiovascular: Normal sinus rhythm, no  rubs Abdomen: Nontender nondistended good bowel sounds Musculoskeletal: No edema noted, significant amount of weakness in all the extremities. Skin: No rashes seen Neurologic: CN 2-12 grossly intact.  And nonfocal Psychiatric: Normal judgment and insight. Alert and oriented x 3. Normal mood.     Objective: Vitals:   02/29/20 0012 02/29/20 0411 02/29/20 0500 02/29/20 0817  BP: 122/84 127/82    Pulse: 81 83 80   Resp: (!) 27 (!) 26 (!) 25   Temp: 98.4 F (36.9 C) 97.7 F (36.5 C)  98.2 F (36.8 C)  TempSrc: Oral Oral  Oral  SpO2: 100% 100% 98%   Weight:  101 kg    Height:        Intake/Output Summary (Last 24 hours) at 02/29/2020 1155 Last data filed at 02/29/2020 0515 Gross per 24 hour  Intake 842.85 ml  Output 850 ml  Net -7.15 ml   Filed Weights   02/26/20 0500 02/28/20 0349 02/29/20 0411  Weight: 99.5 kg 101 kg 101 kg     Data Reviewed:   CBC: Recent Labs  Lab 02/25/20 0437 02/26/20 0807 02/27/20 0227 02/28/20 0610 02/29/20 0635  WBC 11.0* 13.2* 12.2* 10.5 10.2  HGB 8.0* 8.8* 9.1* 10.2* 9.1*  HCT 27.7* 30.1* 30.1* 34.5* 29.9*  MCV 113.5* 111.5* 109.1* 111.7* 107.6*  PLT 301 400 376 293 458   Basic Metabolic Panel: Recent Labs  Lab 02/25/20 0437 02/26/20 0807 02/27/20 0227 02/28/20 0610 02/29/20 0635  NA 153* 153* 144 143 143  K 4.1 3.9 3.5 3.5 3.1*  CL 109 110 105 109 105  CO2 38* _0 GLUCOSE 122* 97 115* 102* 111*  BUN 49* 41* 32* 20 12  CREATININE 1.38* 1.30* 1.30* 1.01 0.99  CALCIUM 8.6* 9.1 8.7* 8.6* 8.4*   GFR: Estimated Creatinine Clearance: 84.5 mL/min (by C-G formula based on SCr of 0.99 mg/dL). Liver Function Tests: No results for input(s): AST, ALT, ALKPHOS, BILITOT, PROT, ALBUMIN in the last 168 hours. No results for input(s): LIPASE,  AMYLASE in the last 168 hours. No results for input(s): AMMONIA in the last 168 hours. Coagulation Profile: Recent Labs  Lab 02/25/20 0437 02/26/20 0807 02/27/20 0227 02/28/20 0610 02/29/20 0635  INR 1.3* 1.2 1.2 1.2 1.3*   Cardiac Enzymes: No results for input(s): CKTOTAL, CKMB, CKMBINDEX, TROPONINI in the last 168 hours. BNP (last 3 results) No results for input(s): PROBNP in the last 8760 hours. HbA1C: No results for input(s): HGBA1C in the last 72 hours. CBG: Recent Labs  Lab 02/29/20 0022 02/29/20 0409 02/29/20 0818 02/29/20 0844 02/29/20 0907  GLUCAP 103* 92 45* 47* 109*   Lipid Profile: No results for input(s): CHOL, HDL, LDLCALC, TRIG, CHOLHDL, LDLDIRECT in the last 72 hours. Thyroid Function Tests: Recent Labs    02/29/20 0835  TSH 1.613   Anemia Panel: Recent Labs    02/29/20 0835  VITAMINB12 374   Sepsis Labs: Recent Labs  Lab 02/29/20 0835  PROCALCITON 0.18    Recent Results (from the past 240 hour(s))  MRSA PCR Screening     Status: None   Collection Time: 02/24/20  1:04 AM   Specimen: Nasal Mucosa; Nasopharyngeal  Result Value Ref Range Status   MRSA by PCR NEGATIVE NEGATIVE Final    Comment:        The GeneXpert MRSA Assay (FDA approved for NASAL specimens only), is one component of a comprehensive MRSA colonization surveillance program. It is not intended to diagnose MRSA infection nor to guide or  monitor treatment for MRSA infections. Performed at [facility], [place] [address] [place], [place] [address]          Radiology Studies: DG Abd 1 View  Result Date: 02/27/2020 CLINICAL DATA:  Abdominal distension. EXAM: ABDOMEN - 1 VIEW COMPARISON:  02/21/2020 FINDINGS: Only the upper abdomen and lower chest are included on a single image. The visualized bowel gas pattern is normal. Stable inferior vena cava filter with its tip at the upper L2 level. Previously noted bilateral lung opacities. The previously seen enteric tube is  no longer visualized. Unremarkable bones. IMPRESSION: No acute abnormality. Electronically Signed   By: Claudie Revering M.D.   On: 02/27/2020 13:56   DG Chest Port 1 View  Result Date: 02/28/2020 CLINICAL DATA:  Shortness of breath.  History of sarcoidosis. EXAM: PORTABLE CHEST 1 VIEW COMPARISON:  02/24/2020; 02/23/2020; 10/06/2019; chest CT-02/14/2020 FINDINGS: Interval extubation and removal of enteric tube.  No pneumothorax. Grossly unchanged cardiac silhouette and mediastinal contours with atherosclerotic plaque within the thoracic aorta given reduced lung volumes. There is mild rightward deviation of the tracheal air column, potentially accentuated due to positioning. Rather extensive peripheral predominant interstitial opacities and architectural distortion are unchanged. Grossly unchanged trace pleural effusions. No definitive new focal airspace opacities. No evidence of edema. No acute osseous abnormalities. Degenerative change the bilateral chromium clavicular joints is suspected though incompletely evaluated. IMPRESSION: 1. Interval extubation removal of enteric tube.  No pneumothorax. 2. Slightly reduced lung volumes with grossly unchanged rather extensive bilateral interstitial opacities and architectural distortion, compatible provided history of sarcoidosis. Electronically Signed   By: Sandi Mariscal M.D.   On: 02/28/2020 09:32        Scheduled Meds: . arformoterol  15 mcg Nebulization BID  . azithromycin  500 mg Oral Q M,W,F  . budesonide (PULMICORT) nebulizer solution  0.5 mg Nebulization BID  . chlorhexidine  15 mL Mouth Rinse BID  . chlorhexidine gluconate (MEDLINE KIT)  15 mL Mouth Rinse BID  . Chlorhexidine Gluconate Cloth  6 each Topical Daily  . cholecalciferol  1,000 Units Oral Daily  . feeding supplement (ENSURE ENLIVE)  237 mL Oral TID BM  . insulin aspart  0-15 Units Subcutaneous Q4H  . ipratropium-albuterol  3 mL Nebulization TID  . mouth rinse  15 mL Mouth Rinse q12n4p  .  oxymetazoline  1 spray Each Nare BID  . pantoprazole  40 mg Oral Daily  . [START ON 03/01/2020] predniSONE  20 mg Oral Q breakfast  . sodium chloride  2 spray Each Nare Q4H while awake  . sodium chloride flush  10-40 mL Intracatheter Q12H  . Warfarin - Pharmacist Dosing Inpatient   Does not apply q1800   Continuous Infusions: . sodium chloride Stopped (02/24/20 1303)  . dextrose 50 mL/hr at 02/29/20 0900  . heparin 1,050 Units/hr (02/29/20 0617)  . potassium chloride       LOS: 14 days   Time spent= 35 mins    Yashas Camilli Arsenio Loader, MD [facility]  If 7PM-7AM, please contact night-coverage  02/29/2020, 11:55 AM

## 2020-03-01 ENCOUNTER — Inpatient Hospital Stay (HOSPITAL_COMMUNITY): Payer: Medicare Other

## 2020-03-01 ENCOUNTER — Inpatient Hospital Stay
Admission: AD | Admit: 2020-03-01 | Discharge: 2020-03-11 | Disposition: A | Discharge status: Rehabilitation Facility | Payer: Medicare Other | Source: Other Acute Inpatient Hospital | Attending: Internal Medicine | Admitting: Internal Medicine

## 2020-03-01 DIAGNOSIS — D869 Sarcoidosis, unspecified: Secondary | ICD-10-CM | POA: Diagnosis present

## 2020-03-01 DIAGNOSIS — J189 Pneumonia, unspecified organism: Secondary | ICD-10-CM

## 2020-03-01 DIAGNOSIS — U071 COVID-19: Secondary | ICD-10-CM | POA: Diagnosis present

## 2020-03-01 DIAGNOSIS — J9621 Acute and chronic respiratory failure with hypoxia: Secondary | ICD-10-CM | POA: Diagnosis present

## 2020-03-01 DIAGNOSIS — R040 Epistaxis: Secondary | ICD-10-CM | POA: Diagnosis present

## 2020-03-01 DIAGNOSIS — J9600 Acute respiratory failure, unspecified whether with hypoxia or hypercapnia: Secondary | ICD-10-CM

## 2020-03-01 DIAGNOSIS — Z8616 Personal history of COVID-19: Secondary | ICD-10-CM | POA: Diagnosis present

## 2020-03-01 DIAGNOSIS — J9610 Chronic respiratory failure, unspecified whether with hypoxia or hypercapnia: Secondary | ICD-10-CM | POA: Diagnosis present

## 2020-03-01 DIAGNOSIS — R0603 Acute respiratory distress: Secondary | ICD-10-CM

## 2020-03-01 HISTORY — DX: Acute and chronic respiratory failure with hypoxia: J96.21

## 2020-03-01 HISTORY — DX: Epistaxis: R04.0

## 2020-03-01 HISTORY — DX: COVID-19: U07.1

## 2020-03-01 HISTORY — DX: COVID-19: I26.99

## 2020-03-01 LAB — BASIC METABOLIC PANEL
Anion gap: 12 (ref 5–15)
BUN: 10 mg/dL (ref 8–23)
CO2: 27 mmol/L (ref 22–32)
Calcium: 8.4 mg/dL — ABNORMAL LOW (ref 8.9–10.3)
Chloride: 103 mmol/L (ref 98–111)
Creatinine, Ser: 0.98 mg/dL (ref 0.61–1.24)
GFR calc Af Amer: >60 mL/min (ref >60)
GFR calc non Af Amer: >60 mL/min (ref >60)
Glucose, Bld: 81 mg/dL (ref 70–99)
Potassium: 3.5 mmol/L (ref 3.5–5.1)
Sodium: 142 mmol/L (ref 135–145)

## 2020-03-01 LAB — CBC
HCT: 31.8 % — ABNORMAL LOW (ref 39.0–52.0)
Hemoglobin: 9.8 g/dL — ABNORMAL LOW (ref 13.0–17.0)
MCH: 32.9 pg (ref 26.0–34.0)
MCHC: 30.8 g/dL (ref 30.0–36.0)
MCV: 106.7 fL — ABNORMAL HIGH (ref 80.0–100.0)
Platelets: 283 10*3/uL (ref 150–400)
RBC: 2.98 MIL/uL — ABNORMAL LOW (ref 4.22–5.81)
RDW: 14.9 % (ref 11.5–15.5)
WBC: 13.3 10*3/uL — ABNORMAL HIGH (ref 4.0–10.5)
nRBC: 0 % (ref 0.0–0.2)

## 2020-03-01 LAB — PROTIME-INR
INR: 1.4 — ABNORMAL HIGH (ref 0.8–1.2)
INR: 1.5 — ABNORMAL HIGH (ref 0.8–1.2)
Prothrombin Time: 17.5 seconds — ABNORMAL HIGH (ref 11.4–15.2)
Prothrombin Time: 18.5 seconds — ABNORMAL HIGH (ref 11.4–15.2)

## 2020-03-01 LAB — GLUCOSE, CAPILLARY
Glucose-Capillary: 76 mg/dL (ref 70–99)
Glucose-Capillary: 56 mg/dL — ABNORMAL LOW (ref 70–99)
Glucose-Capillary: 78 mg/dL (ref 70–99)
Glucose-Capillary: 75 mg/dL (ref 70–99)

## 2020-03-01 LAB — FOLATE RBC
Folate, Hemolysate: 311 ng/mL
Folate, RBC: 1065 ng/mL (ref >498)
Hematocrit: 29.2 % — ABNORMAL LOW (ref 37.5–51.0)

## 2020-03-01 MED ORDER — ENOXAPARIN SODIUM 100 MG/ML ~~LOC~~ SOLN: 1.0000 mg/kg | Freq: Two times a day (BID) | SUBCUTANEOUS | Status: DC

## 2020-03-01 MED ORDER — ENSURE ENLIVE PO LIQD: 237.0000 mL | Freq: Three times a day (TID) | ORAL | 12 refills | Status: DC

## 2020-03-01 MED ORDER — OXYMETAZOLINE HCL 0.05 % NA SOLN: 1.0000 | Freq: Two times a day (BID) | NASAL | 0 refills | Status: DC

## 2020-03-01 MED ORDER — PREDNISONE 20 MG PO TABS: 20.0000 mg | ORAL_TABLET | Freq: Every day | ORAL | 0 refills | Status: DC

## 2020-03-01 MED ORDER — BUDESONIDE 0.5 MG/2ML IN SUSP: 0.5000 mg | Freq: Two times a day (BID) | RESPIRATORY_TRACT | 12 refills | Status: DC

## 2020-03-01 MED ORDER — IPRATROPIUM-ALBUTEROL 0.5-2.5 (3) MG/3ML IN SOLN: 3.0000 mL | RESPIRATORY_TRACT | Status: DC | PRN

## 2020-03-01 NOTE — Progress Notes (Signed)
Notified by NT that pt CBG 56. NT provided pt with 120 ml apple juice. CBG 78 upon recheck. Will continue to monitor.

## 2020-03-01 NOTE — Discharge Summary (Signed)
Physician Discharge Summary  Jagdeep Ancheta INO:[id number] DOB: [birth year] DOA: 02/14/2020  PCP: Jeanmarie Hubert, MD  Admit date: 02/14/2020 Discharge date: 03/01/2020  Admitted From: Home Disposition:  [facility]  Recommendations for Outpatient Follow-up:  1. Follow up with PCP in 1-2 weeks 2. Please obtain BMP/CBC in one week your next doctors visit.  3. Follow-up patient pulmonary in 2 weeks 4. Continue daily bronchodilators as prescribed, incentive spirometer, flutter valve 5. Aspiration precaution 6. Prednisone 20 mg daily for 2-4 weeks and taper as deemed appropriate 7. Continue prophylactic Bactrim 8. Continue Accu-Cheks every 4-6 hours.  Continue D5 or D10 infusion if necessary to maintain his blood glucose greater than 80. 9. Subcutaneous Lovenox bridge to Coumadin.  Goal INR 2.0-3.0. 10. PPI daily 30 minutes before meals. 11. Avoid nasal cannula for now due to epistaxis.  Use mask if necessary  Discharge Condition: Stable CODE STATUS: Full code Diet recommendation: Dysphagia 3 diet.  Advance as appropriate  Brief/Interim Summary: 69 year old with history of Covid January 2021, sarcoidosis, antiphospholipid antibody syndrome, prior PE/DVT on Coumadin but temporarily discontinued in order to take naproxen admitted for acute PE complicated by epistaxis.  Need to be intubated, ENT was consulted.  Echocardiogram was suggestive of RV failure, had bilateral lower extremity DVTs.  Had to be intubated on 2/23 for airway protection and eventually extubated on 3/4.  Taken to the OR on 3/3 for nasal cautery.  Patient's epistaxis resolved, slowly heparin drip turned off and started transitioning patient to Coumadin with goal INR 2.0-3.0.  Intermittently continue to have bilateral wheezing with stable infiltrates/chronic changes on the chest x-ray secondary to sarcoidosis therefore prednisone along with bronchodilators and prophylactic Bactrim was restarted.  Advised to follow-up outpatient pulmonary  in 2 weeks.  Also seen by speech team who advised dysphagia 3 your regular diet with aspiration precautions.  Hospital course also complicated by recurrent hypoglycemia despite of being off any antidiabetic oral medications.  Initially did well on D5 infusion but to reduce fluid concentration was changed to D10 with as needed glucagon.  Overall patient's mentation was well in the hospital.  Given his prolonged hospital course and expected longer course of recovery it was determined to transition patient to long-term care facility/hospital.   Acute hypoxic respiratory failure Acute pulmonary embolism with cor pulmonale Aspiration complicated by epistaxis Bilateral small pleural effusion -Bridge to Coumadin from Lovenox -Nasal cautery performed by ENT.  Had to be intubated couple of times for airway protection. -Echocardiogram was suggestive of cor pulmonale.  Lower extremity Dopplers positive for bilateral DVTs -I-S/flutter, aspiration precaution -Aggressive bronchodilators -Daily prednisone 20 mg daily, resume prophylactic Bactrim -Avoid use of nasal cannula.  Follow-up outpatient ENT in 2 weeks-Dr [name] -Aspiration precautions.  Procalcitonin 0.18  Diminished abdominal bowel sounds concerns for ileus -Back to chair as possible  Recurrent hypoglycemia -Continue D5 or D10 infusion to maintain blood glucose.  As needed amp of D50.  Continue Accu-Cheks every 4-6 hours.  History of antiphospholipid antibody -Complicated by thromboembolic disease, requires lifelong anticoagulation. -Lovenox to Coumadin bridge  Acute kidney injury with hypernatremia -Resolved  History of sarcoidosis -Currently back on prednisone 20 mg daily.  Continue to monitor.  Subacute T8 fracture -Supportive care  Macrocytic anemia -TSH and B12 normal    Discharge Diagnoses:  Principal Problem:   Acute pulmonary embolism with acute cor pulmonale (HCC) Active Problems:   Sarcoidosis    Long term (current) use of anticoagulants   History of pulmonary embolism   Hypertension   Encounter  for central line placement   Compression fracture of T6 vertebra (HCC)   Acute respiratory failure (HCC)   Shock circulatory (HCC)   Bleeding from the nose    Subjective: Feeling okay but still has intermittent respiratory distress with minimal movement.  Discharge Exam: Vitals:   03/01/20 0609 03/01/20 0744  BP: (!) 142/89 (!) 146/83  Pulse:  88  Resp: (!) 27 (!) 24  Temp: 98.2 F (36.8 C) 98.1 F (36.7 C)  SpO2: 98% 100%   Vitals:   02/29/20 2200 03/01/20 0105 03/01/20 0609 03/01/20 0744  BP: (!) 155/87 131/88 (!) 142/89 (!) 146/83  Pulse:  88  88  Resp:  (!) 31 (!) 27 (!) 24  Temp: 98.5 F (36.9 C) 98.5 F (36.9 C) 98.2 F (36.8 C) 98.1 F (36.7 C)  TempSrc: Oral Axillary Axillary Axillary  SpO2:  97% 98% 100%  Weight:      Height:        General: Pt is alert, awake, not in acute distress, supplemental oxygen via mask, chronically ill-appearing. Cardiovascular: RRR, S1/S2 +, no rubs, no gallops Respiratory: Bilateral rhonchi Abdominal: Soft, NT, ND, bowel sounds + Extremities: no edema, no cyanosis  Discharge Instructions   Allergies as of 03/01/2020   No Known Allergies     Medication List    STOP taking these medications   naproxen 500 MG tablet Commonly known as: Naprosyn   Suprep Bowel Prep Kit 17.5-3.13-1.6 GM/177ML Soln Generic drug: Na Sulfate-K Sulfate-Mg Sulf     TAKE these medications   alendronate 70 MG tablet Commonly known as: Fosamax Take 1 tablet (70 mg total) by mouth every 7 (seven) days. Take with a full glass of water on an empty stomach.   Breo Ellipta 200-25 MCG/INH Aepb Generic drug: fluticasone furoate-vilanterol INHALE 1 PUFF INTO THE LUNGS DAILY What changed: See the new instructions.   budesonide 0.5 MG/2ML nebulizer solution Commonly known as: PULMICORT Take 2 mLs (0.5 mg total) by nebulization 2 (two) times  daily.   cholecalciferol 25 MCG (1000 UNIT) tablet Commonly known as: VITAMIN D3 Take 1,000 Units by mouth daily.   cyclobenzaprine 5 MG tablet Commonly known as: FLEXERIL Take 1 tablet (5 mg total) by mouth 3 (three) times daily as needed for muscle spasms.   enoxaparin 100 MG/ML injection Commonly known as: LOVENOX Inject 1 mL (100 mg total) into the skin 2 (two) times daily.   feeding supplement (ENSURE ENLIVE) Liqd Take 237 mLs by mouth 3 (three) times daily between meals.   ipratropium-albuterol 0.5-2.5 (3) MG/3ML Soln Commonly known as: DUONEB Take 3 mLs by nebulization every 4 (four) hours as needed.   losartan-hydrochlorothiazide 50-12.5 MG tablet Commonly known as: HYZAAR Take 1 tablet by mouth daily.   omeprazole 40 MG capsule Commonly known as: PriLOSEC Take 1 capsule (40 mg total) by mouth daily. What changed: when to take this   OXYGEN Inhale 2 L/min into the lungs at bedtime.   oxymetazoline 0.05 % nasal spray Commonly known as: AFRIN Place 1 spray into both nostrils 2 (two) times daily.   predniSONE 20 MG tablet Commonly known as: DELTASONE Take 1 tablet (20 mg total) by mouth daily with breakfast. Start taking on: March 02, 2020 What changed:   medication strength  how much to take  how to take this  when to take this  additional instructions   sulfamethoxazole-trimethoprim 400-80 MG tablet Commonly known as: BACTRIM Take 1 tablet by mouth 3 (three) times a week.   warfarin  4 MG tablet Commonly known as: COUMADIN Take as directed. If you are unsure how to take this medication, talk to your nurse or doctor. Original instructions: TAKE 1/2 OF YOUR 4 MG WARFARIN TABLET ON SUNDAYS, TUESDAYS, THURSDAYS AND SATURDAYS. ALL OTHER DAYS TAKE 1 TABLET What changed:   how much to take  how to take this  when to take this  additional instructions   zinc gluconate 50 MG tablet Take 50 mg by mouth daily.       No Known Allergies  You  were cared for by a hospitalist during your hospital stay. If you have any questions about your discharge medications or the care you received while you were in the hospital after you are discharged, you can call the unit and asked to speak with the hospitalist on call if the hospitalist that took care of you is not available. Once you are discharged, your primary care physician will handle any further medical issues. Please note that no refills for any discharge medications will be authorized once you are discharged, as it is imperative that you return to your primary care physician (or establish a relationship with a primary care physician if you do not have one) for your aftercare needs so that they can reassess your need for medications and monitor your lab values.   Procedures/Studies: DG Chest 2 View  Result Date: 02/14/2020 CLINICAL DATA:  Left-sided abdominal pain EXAM: CHEST - 2 VIEW COMPARISON:  01/24/2020 and 10/06/2019 FINDINGS: Chronic interstitial opacities in the lungs bilaterally, including in the right perihilar region. Left upper hilar retraction. 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 No superimposed focal consolidation. No pleural effusion or pneumothorax. Mild cardiomegaly. Degenerative changes of the visualized thoracolumbar spine. Mild to moderate compression fracture deformities of 4 mid/lower thoracic vertebral bodies, new/progressive from October 2020. 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 No evidence of acute cardiopulmonary disease. Mild to moderate compression fracture deformities of 4 mid/lower thoracic vertebral bodies, new/progressive from October 2020. Electronically Signed   By: Julian Hy M.D.   On: 02/14/2020 15:33   DG Abd 1 View  Result Date: 02/27/2020 CLINICAL DATA:  Abdominal distension. EXAM: ABDOMEN - 1 VIEW COMPARISON:  02/21/2020 FINDINGS:  Only the upper abdomen and lower chest are included on a single image. The visualized bowel gas pattern is normal. Stable inferior vena cava filter with its tip at the upper L2 level. Previously noted bilateral lung opacities. The previously seen enteric tube is no longer visualized. Unremarkable bones. IMPRESSION: No acute abnormality. Electronically Signed   By: Claudie Revering M.D.   On: 02/27/2020 13:56   DG Abd 1 View  Result Date: 02/21/2020 CLINICAL DATA:  Tube placement EXAM: ABDOMEN - 1 VIEW COMPARISON:  None. FINDINGS: The enteric tube projects over the gastric body. The tip is pointed distally. The bowel gas pattern is nonspecific. A retrievable IVC filter is noted. IMPRESSION: 1. Enteric tube projects over the gastric body. The tip is pointed distally. 2. Retrievable IVC filter. Electronically Signed   By: Constance Holster M.D.   On: 02/21/2020 18:38   CT Angio Chest PE W and/or Wo Contrast  Result Date: 02/14/2020 CLINICAL DATA:  69 year old male with history of shortness of breath. EXAM: CT ANGIOGRAPHY CHEST WITH CONTRAST TECHNIQUE: Multidetector CT imaging of the chest was performed using the standard protocol during bolus administration of intravenous contrast. Multiplanar CT image reconstructions and MIPs were obtained to  evaluate the vascular anatomy. CONTRAST:  35m OMNIPAQUE IOHEXOL 350 MG/ML SOLN COMPARISON:  No priors. FINDINGS: Cardiovascular: There are numerous filling defects within pulmonary artery branches bilaterally, compatible with widespread pulmonary embolism. This includes segmental and subsegmental sized branches, the majority of which are nonocclusive, however, some occlusive embolus in the right lower lobe and left lower lobe is noted. Heart size is normal. There is no significant pericardial fluid, thickening or pericardial calcification. There is aortic atherosclerosis, as well as atherosclerosis of the great vessels of the mediastinum and the coronary arteries, including  calcified atherosclerotic plaque in the left anterior descending, left circumflex and right coronary arteries. Calcifications of the aortic valve. Mediastinum/Nodes: No pathologically enlarged mediastinal or hilar lymph nodes. Esophagus is unremarkable in appearance. No axillary lymphadenopathy. Lungs/Pleura: Extensive architectural distortion is noted throughout the lungs bilaterally, with widespread areas of cylindrical and varicose bronchiectasis, thickening of the peribronchovascular interstitium, chronic volume loss and upward retraction of hilar structures, compatible with reported clinical history of sarcoidosis. Widespread air trapping is also noted, most severe throughout the right middle and lower lobes. No acute consolidative airspace disease. No pleural effusions. No definite suspicious appearing pulmonary nodules or masses are noted. Upper Abdomen: Please see dictation for contemporaneously obtained CT the abdomen and pelvis for full description of relevant findings below the diaphragm. Musculoskeletal: There are no aggressive appearing lytic or blastic lesions noted in the visualized portions of the skeleton. Chronic appearing compression fractures of T6, T8, T9 and T10 are noted, most severe at T8 where there is 60% loss of anterior vertebral body height. Review of the MIP images confirms the above findings. IMPRESSION: 1. Study is positive for pulmonary embolism with a combination of occlusive and nonocclusive embolus in the lower lungs bilaterally ranging from segmental to subsegmental sized vessels. 2. Aortic atherosclerosis, in addition to left main and 3 vessel coronary artery disease. Please note that although the presence of coronary artery calcium documents the presence of coronary artery disease, the severity of this disease and any potential stenosis cannot be assessed on this non-gated CT examination. Assessment for potential risk factor modification, dietary therapy or pharmacologic  therapy may be warranted, if clinically indicated. 3. There are calcifications of the aortic valve. Echocardiographic correlation for evaluation of potential valvular dysfunction may be warranted if clinically indicated. 4. Imaging findings in the lungs compatible with reported clinical history of sarcoidosis, as above. Critical Value/emergent results were called by telephone at the time of interpretation on 02/14/2020 at 6:40 pm [facility] verbally acknowledged these results. Aortic Atherosclerosis (ICD10-I70.0). Electronically Signed   By: DVinnie LangtonM.D.   On: 02/14/2020 18:41   CT ABDOMEN PELVIS W CONTRAST  Result Date: 02/14/2020 CLINICAL DATA:  69year old male with history of left upper quadrant abdominal pain. Left-sided flank pain. EXAM: CT ABDOMEN AND PELVIS WITH CONTRAST TECHNIQUE: Multidetector CT imaging of the abdomen and pelvis was performed using the standard protocol following bolus administration of intravenous contrast. CONTRAST:  1041mOMNIPAQUE IOHEXOL 300 MG/ML  SOLN COMPARISON:  No priors. FINDINGS: Lower chest: Areas of bronchiectasis and scarring in the lung bases bilaterally. Atherosclerotic calcifications in the left main, left anterior descending, left circumflex and right coronary arteries. Calcifications of the aortic valve. Hepatobiliary: No suspicious cystic or solid hepatic lesions. No intra or extrahepatic biliary ductal dilatation. Multiple peripherally calcified gallstones are noted in the gallbladder. Gallbladder is contracted around these gallstones. No findings to suggest an acute cholecystitis at this time. Pancreas: No pancreatic mass. No pancreatic  ductal dilatation. No pancreatic or peripancreatic fluid collections or inflammatory changes. Spleen: Unremarkable. Adrenals/Urinary Tract: Bilateral kidneys and adrenal glands are normal in appearance. No hydroureteronephrosis. Urinary bladder is normal in appearance. Stomach/Bowel: Normal appearance  of the stomach. No pathologic dilatation of small bowel or colon. Numerous colonic diverticulae are noted, without surrounding inflammatory changes to suggest an acute diverticulitis at this time. Normal appendix. Vascular/Lymphatic: Aortic atherosclerosis with aneurysmal dilatation of the right common iliac artery which measures up to 2 cm in diameter. IVC filter in place with tip immediately below the level of the renal veins. No lymphadenopathy noted in the abdomen or pelvis. Reproductive: Prostate gland and seminal vesicles are unremarkable in appearance. Other: No significant volume of ascites.  No pneumoperitoneum. Musculoskeletal: Compression fractures are noted at T8 and T10. At T8 this is most severe where there is 50% loss of anterior vertebral body height and faintly visualized fracture lines, suggesting a subacute injury (no paravertebral soft tissue swelling is noted to suggest an acute injury). There are no aggressive appearing lytic or blastic lesions noted in the visualized portions of the skeleton. IMPRESSION: 1. No acute findings are noted in the abdomen or pelvis to account for the patient's symptoms. 2. However, there is a probable subacute fracture of the T8 vertebral body, as discussed above. 3. Numerous colonic diverticulae are noted, without surrounding inflammatory changes to suggest an acute diverticulitis at this time. 4. Cholelithiasis without evidence of acute cholecystitis at this time. 5. Aortic atherosclerosis, in addition to left main and 3 vessel coronary artery disease. Please note that although the presence of coronary artery calcium documents the presence of coronary artery disease, the severity of this disease and any potential stenosis cannot be assessed on this non-gated CT examination. Assessment for potential risk factor modification, dietary therapy or pharmacologic therapy may be warranted, if clinically indicated. 6. There are calcifications of the aortic valve.  Echocardiographic correlation for evaluation of potential valvular dysfunction may be warranted if clinically indicated. 7. Unusual appearance of the visualized lungs which could be seen in the setting of a systemic disease such as sarcoidosis. Electronically Signed   By: Vinnie Langton M.D.   On: 02/14/2020 16:00   DG Chest Port 1 View  Result Date: 03/01/2020 CLINICAL DATA:  Sarcoidosis. Dyspnea. EXAM: PORTABLE CHEST 1 VIEW COMPARISON:  02/28/2020 chest radiograph. FINDINGS: Stable cardiomediastinal silhouette with normal heart size. No pneumothorax. Stable mild blunting of the costophrenic angles bilaterally, compatible with pleural-parenchymal scarring as seen on 02/14/2020 chest CT. Patchy irregular and reticular opacities with parenchymal distortion throughout both lungs with an upper lung predominance, not appreciably changed. IMPRESSION: Stable chest radiograph with patchy irregular and reticular opacities with parenchymal distortion throughout both lungs with an upper lung predominance, compatible with known sarcoidosis. Electronically Signed   By: Ilona Sorrel M.D.   On: 03/01/2020 09:33   DG Chest Port 1 View  Result Date: 02/28/2020 CLINICAL DATA:  Shortness of breath.  History of sarcoidosis. EXAM: PORTABLE CHEST 1 VIEW COMPARISON:  02/24/2020; 02/23/2020; 10/06/2019; chest CT-02/14/2020 FINDINGS: Interval extubation and removal of enteric tube.  No pneumothorax. Grossly unchanged cardiac silhouette and mediastinal contours with atherosclerotic plaque within the thoracic aorta given reduced lung volumes. There is mild rightward deviation of the tracheal air column, potentially accentuated due to positioning. Rather extensive peripheral predominant interstitial opacities and architectural distortion are unchanged. Grossly unchanged trace pleural effusions. No definitive new focal airspace opacities. No evidence of edema. No acute osseous abnormalities. Degenerative change the bilateral chromium  clavicular joints is suspected though incompletely evaluated. IMPRESSION: 1. Interval extubation removal of enteric tube.  No pneumothorax. 2. Slightly reduced lung volumes with grossly unchanged rather extensive bilateral interstitial opacities and architectural distortion, compatible provided history of sarcoidosis. Electronically Signed   By: Sandi Mariscal M.D.   On: 02/28/2020 09:32   DG CHEST PORT 1 VIEW  Result Date: 02/24/2020 CLINICAL DATA:  Acute respiratory failure EXAM: PORTABLE CHEST 1 VIEW COMPARISON:  February 23, 2020 FINDINGS: The heart size and mediastinal contours are stable. ET tube and NG tube are in unchanged position. Again noted are multifocal coarsened patchy interstitial markings and patchy airspace opacities. Trace bilateral pleural effusions are noted. No acute osseous abnormality. IMPRESSION: Stable examination. Multifocal coarsened patchy interstitial opacities and trace bilateral pleural effusions. Lines and tubes in unchanged position. Electronically Signed   By: Prudencio Pair M.D.   On: 02/24/2020 06:34   DG CHEST PORT 1 VIEW  Result Date: 02/23/2020 CLINICAL DATA:  Respiratory failure EXAM: PORTABLE CHEST 1 VIEW COMPARISON:  February 21, 2020 FINDINGS: The heart size and mediastinal contours are stable. ET tube and NG tube are in unchanged position. There is slight interval worsening in the coarsened/patchy airspace opacities throughout the left lung. There is unchanged interstitial opacities throughout the right lung. Stable bilateral pleural effusions are seen, right worse than left. IMPRESSION: 1. Lines and tubes in unchanged position. 2. Bilateral patchy/interstitial opacities, with slight interval worsening in the left lung. 3. Small bilateral pleural effusions, right worse than left. Electronically Signed   By: Prudencio Pair M.D.   On: 02/23/2020 06:39   DG CHEST PORT 1 VIEW  Result Date: 02/21/2020 CLINICAL DATA:  Endotracheal tube intubation EXAM: PORTABLE CHEST 1 VIEW  COMPARISON:  02/20/2020 FINDINGS: The endotracheal tube terminates just above the carina, however the tube is pointed towards the right mainstem bronchus. The enteric tube extends below the left hemidiaphragm. Persistent diffuse coarse airspace opacities are noted bilaterally. There is no pneumothorax. There is stable blunting of the costophrenic angles bilaterally. The heart size is stable. IMPRESSION: 1. The endotracheal tube terminates just above the carina with the tube pointed towards the right mainstem bronchus. Consider retracting the tube by approximately 1 cm. 2. The enteric tube extends below the left hemidiaphragm. 3. Otherwise, no significant interval change. These results will be called to the ordering clinician or representative by the Radiologist Assistant, and communication documented in the PACS or zVision Dashboard. Electronically Signed   By: Constance Holster M.D.   On: 02/21/2020 18:38   DG CHEST PORT 1 VIEW  Result Date: 02/20/2020 CLINICAL DATA:  Acute respiratory failure with hypoxia EXAM: PORTABLE CHEST 1 VIEW COMPARISON:  02/17/2020 FINDINGS: Endotracheal tube in good position. NG tube in the stomach. Left-sided central venous catheter in the lower SVC. Bilateral airspace densities unchanged and appear chronic. No acute infiltrate or effusion. IMPRESSION: Endotracheal tube in good position. Chronic bilateral airspace disease due to scarring unchanged. Electronically Signed   By: Franchot Gallo M.D.   On: 02/20/2020 07:38   DG Chest Port 1 View  Result Date: 02/17/2020 CLINICAL DATA:  Acute respiratory failure. EXAM: PORTABLE CHEST 1 VIEW COMPARISON:  02/16/2020 FINDINGS: Endotracheal tube, NG tube, and central venous catheter are unchanged. Extensive chronic changes in both lungs are stable with no new infiltrates or effusions. IMPRESSION: No change.  Severe chronic lung disease. Electronically Signed   By: Lorriane Shire M.D.   On: 02/17/2020 08:48   DG Chest [facility] 1  View  Result  Date: 02/16/2020 CLINICAL DATA:  Encounter for central line placement. EXAM: PORTABLE CHEST 1 VIEW 2:34 p.m. COMPARISON:  02/16/2020 01:30 p.m. FINDINGS: New left subclavian catheter has been inserted. The tip is approximately 5 cm below the carina at the cavoatrial junction. No pneumothorax. Endotracheal tube is in good position 3.4 cm above the carina. No change in the severe chronic lung disease. IMPRESSION: Central line in good position. No pneumothorax. No change in the severe chronic lung disease. Electronically Signed   By: Lorriane Shire M.D.   On: 02/16/2020 14:53   Portable Chest x-ray  Result Date: 02/16/2020 CLINICAL DATA:  Endotracheal tube placement. Acute pulmonary embolism with acute cor pulmonale. EXAM: PORTABLE CHEST 1 VIEW COMPARISON:  Chest x-ray dated 02/15/2020 and chest CT dated 02/14/2020 the FINDINGS: Endotracheal tube is been inserted and the tip is 2.8 cm above the carina. Heart size and pulmonary vascularity are normal. Severe chronic interstitial and obstructive changes are noted bilaterally, unchanged IMPRESSION: Endotracheal tube in good position. 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 Electronically Signed   By: Lorriane Shire M.D.   On: 02/16/2020 13:54   DG CHEST PORT 1 VIEW  Result Date: 02/15/2020 CLINICAL DATA:  69 year old male with hemoptysis. History of sarcoidosis. EXAM: PORTABLE CHEST 1 VIEW COMPARISON:  Chest radiograph dated 02/14/2020. FINDINGS: Chronic pleuroparenchymal densities with upper lobe predominant similar to prior radiograph in keeping with subcu doses. No new consolidative changes. There is no pleural effusion or pneumothorax. Stable cardiac silhouette. No acute osseous pathology. IMPRESSION: 1. No new consolidation. 2. Chronic pleuroparenchymal densities in keeping with sarcoidosis. Electronically Signed   By: Anner Crete M.D.   On: 02/15/2020 21:35   VAS [place] LOWER EXTREMITY VENOUS (DVT)  Result Date:  02/16/2020  Lower Venous DVTStudy Indications: Swelling.  Limitations: Body habitus and poor ultrasound/tissue interface. Comparison Study: No prior study Performing Technologist: Maudry Mayhew MHA, RDMS, RVT, RDCS  Examination Guidelines: A complete evaluation includes B-mode imaging, spectral Doppler, color Doppler, and power Doppler as needed of all accessible portions of each vessel. Bilateral testing is considered an integral part of a complete examination. Limited examinations for reoccurring indications may be performed as noted. The reflux portion of the exam is performed with the patient in reverse Trendelenburg.  +---------+---------------+---------+-----------+----------+--------------+ RIGHT    CompressibilityPhasicitySpontaneityPropertiesThrombus Aging +---------+---------------+---------+-----------+----------+--------------+ CFV                              No                   Acute          +---------+---------------+---------+-----------+----------+--------------+ SFJ                              No                   Acute          +---------+---------------+---------+-----------+----------+--------------+ FV Prox                          No                   Acute          +---------+---------------+---------+-----------+----------+--------------+ FV Mid  No                   Acute          +---------+---------------+---------+-----------+----------+--------------+ FV Distal                        No                   Acute          +---------+---------------+---------+-----------+----------+--------------+ PFV                              No                   Acute          +---------+---------------+---------+-----------+----------+--------------+ POP                              No                   Acute          +---------+---------------+---------+-----------+----------+--------------+ PTV                               No                   Acute          +---------+---------------+---------+-----------+----------+--------------+ Gastroc  None                    No                   Acute          +---------+---------------+---------+-----------+----------+--------------+ EIV                     Yes      Yes        Patent                   +---------+---------------+---------+-----------+----------+--------------+ Thrombus in right saphenofemoral junction is slightly mobile.  Right Technical Findings: Not visualized segments include peroneal veins, common iliac vein, IVC.  +---------+---------------+---------+-----------+----------+--------------+ LEFT     CompressibilityPhasicitySpontaneityPropertiesThrombus Aging +---------+---------------+---------+-----------+----------+--------------+ CFV      Full           Yes      Yes                                 +---------+---------------+---------+-----------+----------+--------------+ SFJ      Full                                                        +---------+---------------+---------+-----------+----------+--------------+ FV Prox  Full                                                        +---------+---------------+---------+-----------+----------+--------------+ FV Mid   Full                                                        +---------+---------------+---------+-----------+----------+--------------+  FV DistalFull                                                        +---------+---------------+---------+-----------+----------+--------------+ PFV      Full                                                        +---------+---------------+---------+-----------+----------+--------------+ POP      Full           Yes      Yes                                 +---------+---------------+---------+-----------+----------+--------------+ PTV      None                    No                    Acute          +---------+---------------+---------+-----------+----------+--------------+ EIV                     Yes      Yes        Patent                   +---------+---------------+---------+-----------+----------+--------------+   Left Technical Findings: Not visualized segments include common iliac vein, peroneal veins.   Summary: RIGHT: - Findings consistent with acute deep vein thrombosis involving the right common femoral vein, SF junction, right femoral vein, right proximal profunda vein, right popliteal vein, right posterior tibial veins, and right gastrocnemius veins.  LEFT: - Findings consistent with acute deep vein thrombosis involving the left posterior tibial veins.  *See table(s) above for measurements and observations. Electronically signed by Servando Snare MD on 02/16/2020 at 6:07:25 PM.    Final    ECHOCARDIOGRAM LIMITED  Result Date: 02/16/2020    ECHOCARDIOGRAM LIMITED REPORT   Patient Name:   Esvin Samudio Date of Exam: 02/16/2020 Medical Rec #:  [id number]    Height:       68.0 in Accession #:    [id number]   Weight:       225.7 lb Date of Birth:  Jul 04, 1951    BSA:          2.152 m Patient Age:    48 years     BP:           78/55 mmHg Patient Gender: M            HR:           109 bpm. Exam Location:  Inpatient Procedure: Limited Echo, Limited Color Doppler, Cardiac Doppler and Intracardiac            Opacification Agent STAT ECHO Indications:     Pulmonary Embolus 415.19 / I26.99  History:         Patient has prior history of Echocardiogram examinations, most                  recent 02/15/2020. COPD; Risk Factors:Hypertension. History of  COVID.  Sonographer:     Darlina Sicilian RDCS Referring Phys:  [id number] Candee Furbish Diagnosing Phys: Mertie Moores MD IMPRESSIONS  1. Left ventricular ejection fraction, by estimation, is 50 to 55%. The left ventricle has low normal function. The left ventricle has no regional wall motion abnormalities.  2. The images are very  technically difficult . The RV could only be seen using Definity contrast. The RV appears to be very dilated and with severely reduced RV function     There is moderate pulmonary artery hypertension . Right ventricular systolic function is severely reduced. The right ventricular size is severely enlarged. There is moderately elevated pulmonary artery systolic pressure. FINDINGS  Left Ventricle: Left ventricular ejection fraction, by estimation, is 50 to 55%. The left ventricle has low normal function. The left ventricle has no regional wall motion abnormalities. Right Ventricle: The images are very technically difficult . The RV could only be seen using Definity contrast. The RV appears to be very dilated and with severely reduced RV function There is moderate pulmonary artery hypertension. The right ventricular size is severely enlarged. Right vetricular wall thickness was not assessed. Right ventricular systolic function is severely reduced. There is moderately elevated pulmonary artery systolic pressure. The tricuspid regurgitant velocity is 3.12 m/s, and with an assumed right atrial pressure of 8 mmHg, the estimated right ventricular systolic pressure is 27.5 mmHg. Tricuspid Valve: The tricuspid valve is grossly normal. Tricuspid valve regurgitation is trivial. Pulmonic Valve: The pulmonic valve was grossly normal. Pulmonic valve regurgitation is not visualized.   LV Volumes (MOD) LV vol d, MOD A2C: 46.5 ml LV vol d, MOD A4C: 58.1 ml LV vol s, MOD A2C: 27.0 ml LV vol s, MOD A4C: 26.7 ml LV SV MOD A2C:     19.6 ml LV SV MOD A4C:     58.1 ml LV SV MOD BP:      25.2 ml TRICUSPID VALVE TR Peak grad:   38.9 mmHg TR Vmax:        312.00 cm/s Mertie Moores MD Electronically signed by Mertie Moores MD Signature Date/Time: 02/16/2020/3:39:39 PM    Final (Updated)    ECHOCARDIOGRAM LIMITED  Result Date: 02/15/2020    ECHOCARDIOGRAM REPORT   Patient Name:   [facility] Date of Exam: 02/15/2020 Medical Rec #:  [id number]     Height:       68.0 in Accession #:    [id number]   Weight:       225.7 lb Date of Birth:  [birth year]    BSA:          2.152 m Patient Age:    36 years     BP:           138/97 mmHg Patient Gender: M            HR:           95 bpm. Exam Location:  Inpatient Procedure: Limited Echo STAT ECHO Indications:    Pulmonary embolism  History:        Patient has prior history of Echocardiogram examinations, most                 recent 05/07/2016. Risk Factors:Hypertension. COVID-19                 Obstructive sleep apnea.  Sonographer:    Vikki Ports Turrentine Referring Phys: [id number] Oda Kilts  Sonographer Comments: Image acquisition challenging due to patient body habitus and Image acquisition challenging due  to uncooperative patient. IMPRESSIONS  1. Left ventricular ejection fraction, by estimation, is 60 to 65%. The left ventricle has normal function. The left ventricle has no regional wall motion abnormalities. Left ventricular diastolic parameters are consistent with Grade I diastolic dysfunction (impaired relaxation).  2. Right ventricular systolic function is moderately reduced. The right ventricular size is severely enlarged. Tricuspid regurgitation signal is inadequate for assessing PA pressure.  3. The mitral valve is normal in structure and function. No evidence of mitral valve regurgitation. No evidence of mitral stenosis.  4. The aortic valve is tricuspid. Aortic valve regurgitation is not visualized. Mild to moderate aortic valve sclerosis/calcification is present, without any evidence of aortic stenosis.  5. The inferior vena cava is dilated in size with >50% respiratory variability, suggesting right atrial pressure of 8 mmHg. FINDINGS  Left Ventricle: Left ventricular ejection fraction, by estimation, is 60 to 65%. The left ventricle has normal function. The left ventricle has no regional wall motion abnormalities. The left ventricular internal cavity size was normal in size. There is  no left  ventricular hypertrophy. Left ventricular diastolic parameters are consistent with Grade I diastolic dysfunction (impaired relaxation). Normal left ventricular filling pressure. Right Ventricle: The right ventricular size is severely enlarged. No increase in right ventricular wall thickness. Right ventricular systolic function is moderately reduced. Tricuspid regurgitation signal is inadequate for assessing PA pressure. Left Atrium: Left atrial size was normal in size. Right Atrium: Right atrial size was normal in size. Pericardium: There is no evidence of pericardial effusion. Mitral Valve: The mitral valve is normal in structure and function. Normal mobility of the mitral valve leaflets. No evidence of mitral valve regurgitation. No evidence of mitral valve stenosis. Tricuspid Valve: The tricuspid valve is normal in structure. Tricuspid valve regurgitation is not demonstrated. No evidence of tricuspid stenosis. Aortic Valve: The aortic valve is tricuspid. Aortic valve regurgitation is not visualized. Mild to moderate aortic valve sclerosis/calcification is present, without any evidence of aortic stenosis. There is moderate calcification of the non coronayr cusp  of the aortic valve. Pulmonic Valve: The pulmonic valve was normal in structure. Pulmonic valve regurgitation is not visualized. No evidence of pulmonic stenosis. Aorta: The aortic root is normal in size and structure. Venous: The inferior vena cava is dilated in size with greater than 50% respiratory variability, suggesting right atrial pressure of 8 mmHg. IAS/Shunts: No atrial level shunt detected by color flow Doppler.  LEFT VENTRICLE PLAX 2D LVIDd:         4.84 cm LVIDs:         3.67 cm LV PW:         0.92 cm LV IVS:        0.94 cm LVOT diam:     2.10 cm LV SV:         36.02 ml LV SV Index:   16.74 LVOT Area:     3.46 cm  LEFT ATRIUM         Index LA diam:    3.60 cm 1.67 cm/m  AORTIC VALVE LVOT Vmax:   64.00 cm/s LVOT Vmean:  39.900 cm/s LVOT VTI:     0.104 m  AORTA Ao Root diam: 3.30 cm MITRAL VALVE MV Area (PHT): 3.30 cm    SHUNTS MV Decel Time: 230 msec    Systemic VTI:  0.10 m MV E velocity: 47.10 cm/s  Systemic Diam: 2.10 cm MV A velocity: 90.00 cm/s MV E/A ratio:  0.52 Fransico Him MD Electronically signed by Tressia Miners  Turner MD Signature Date/Time: 02/15/2020/8:43:14 AM    Final       The results of significant diagnostics from this hospitalization (including imaging, microbiology, ancillary and laboratory) are listed below for reference.     Microbiology: Recent Results (from the past 240 hour(s))  MRSA PCR Screening     Status: None   Collection Time: 02/24/20  1:04 AM   Specimen: Nasal Mucosa; Nasopharyngeal  Result Value Ref Range Status   MRSA by PCR NEGATIVE NEGATIVE Final    Comment:        The GeneXpert MRSA Assay (FDA approved for NASAL specimens only), is one component of a comprehensive MRSA colonization surveillance program. It is not intended to diagnose MRSA infection nor to guide or monitor treatment for MRSA infections. Performed at [facility], [place] [address], [place], [place] [address]      Labs: BNP (last 3 results) Recent Labs    01/24/20 1616 02/29/20 0835  BNP 67.4 986.1*   Basic Metabolic Panel: Recent Labs  Lab 02/26/20 0807 02/27/20 0227 02/28/20 0610 02/29/20 0635 03/01/20 0046  NA 153* 144 143 143 142  K 3.9 3.5 3.5 3.1* 3.5  CL 110 105 109 105 103  CO2 _0 GLUCOSE 97 115* 102* 111* 81  BUN 41* 32* _1 CREATININE 1.30* 1.30* 1.01 0.99 0.98  CALCIUM 9.1 8.7* 8.6* 8.4* 8.4*   Liver Function Tests: No results for input(s): AST, ALT, ALKPHOS, BILITOT, PROT, ALBUMIN in the last 168 hours. No results for input(s): LIPASE, AMYLASE in the last 168 hours. No results for input(s): AMMONIA in the last 168 hours. CBC: Recent Labs  Lab 02/26/20 0807 02/27/20 0227 02/28/20 0610 02/29/20 0635 03/01/20 0046  WBC 13.2* 12.2* 10.5 10.2 13.3*  HGB 8.8* 9.1*  10.2* 9.1* 9.8*  HCT 30.1* 30.1* 34.5* 29.9* 31.8*  MCV 111.5* 109.1* 111.7* 107.6* 106.7*  PLT 400 376 293 258 283   Cardiac Enzymes: No results for input(s): CKTOTAL, CKMB, CKMBINDEX, TROPONINI in the last 168 hours. BNP: Invalid input(s): POCBNP CBG: Recent Labs  Lab 02/29/20 2125 02/29/20 2304 03/01/20 0321 03/01/20 0743 03/01/20 0824  GLUCAP 104* 81 76 56* 78   D-Dimer No results for input(s): DDIMER in the last 72 hours. Hgb A1c No results for input(s): HGBA1C in the last 72 hours. Lipid Profile No results for input(s): CHOL, HDL, LDLCALC, TRIG, CHOLHDL, LDLDIRECT in the last 72 hours. Thyroid function studies Recent Labs    02/29/20 0835  TSH 1.613   Anemia work up Recent Labs    02/29/20 0835  VITAMINB12 374   Urinalysis No results found for: COLORURINE, APPEARANCEUR, LABSPEC, [place], GLUCOSEU, [place], BILIRUBINUR, KETONESUR, PROTEINUR, UROBILINOGEN, NITRITE, LEUKOCYTESUR Sepsis Labs Invalid input(s): PROCALCITONIN,  WBC,  LACTICIDVEN Microbiology Recent Results (from the past 240 hour(s))  MRSA PCR Screening     Status: None   Collection Time: 02/24/20  1:04 AM   Specimen: Nasal Mucosa; Nasopharyngeal  Result Value Ref Range Status   MRSA by PCR NEGATIVE NEGATIVE Final    Comment:        The GeneXpert MRSA Assay (FDA approved for NASAL specimens only), is one component of a comprehensive MRSA colonization surveillance program. It is not intended to diagnose MRSA infection nor to guide or monitor treatment for MRSA infections. Performed at [facility], [place] [address], [place], [place] [address]      Time coordinating discharge:  I have spent 35 minutes face to  face with the patient and on the ward discussing the patients care, assessment, plan and disposition with other care givers. >50% of the time was devoted counseling the patient about the risks and benefits of treatment/Discharge disposition and coordinating care.    SIGNED:   Damita Lack, MD  [facility] 03/01/2020, 11:16 AM   If 7PM-7AM, please contact night-coverage

## 2020-03-01 NOTE — Progress Notes (Signed)
Inpatient Rehab Admissions Coordinator:   Note pt accepted to [facility] for LTAC.  Will sign off for CIR at this time.   Shann Medal, PT, DPT Admissions Coordinator [phone] 03/01/20  3:00 PM

## 2020-03-01 NOTE — Progress Notes (Signed)
Inpatient Diabetes Program Recommendations  AACE/ADA: New Consensus Statement on Inpatient Glycemic Control (2015)  Target Ranges:  Prepandial:   less than 140 mg/dL      Peak postprandial:   less than 180 mg/dL (1-2 hours)      Critically ill patients:  140 - 180 mg/dL   Lab Results  Component Value Date   GLUCAP 78 03/01/2020   HGBA1C 6.1 (H) 02/17/2020    Review of Glycemic Control Results for KIRKLIN, MCDUFFEE (MRN [id number]) as of 03/01/2020 08:52  Ref. Range 02/29/2020 08:44 02/29/2020 09:07 02/29/2020 12:28 02/29/2020 16:13 02/29/2020 16:51 02/29/2020 20:10 02/29/2020 21:25 02/29/2020 23:04 03/01/2020 03:21 03/01/2020 07:43 03/01/2020 08:24  Glucose-Capillary Latest Ref Range: 70 - 99 mg/dL 47 (L) 109 (H) 92 52 (L) 95 66 (L) 104 (H) 81 76 56 (L) 78   Diabetes history: None  Current orders for Inpatient glycemic control: Novolog 0-15 units Q4 hours  Inpatient Diabetes Program Recommendations:    Glucose trends on the low side. occasional hypoglycemia. Ordered Novolog moderate scale. Please d/c Novolog Correction keeping CBG checks.  Thanks, Tama Headings RN, MSN, BC-ADM Inpatient Diabetes Coordinator Team Pager [phone] (8a-5p)

## 2020-03-01 NOTE — Progress Notes (Signed)
Occupational Therapy Treatment Patient Details Name: Jesus Russell MRN: [id number] DOB: [birth year] Today's Date: 03/01/2020    History of present illness 69 yo admitted 02/15/20 with abdominal pain with acute PE 2/21, 2/22 epistaxis, pt intubated 2/23-2/27. Pt reintubated 2/28 with nosebleed and still on vent on re0eval 3/4.  Returned to OR on 02/24/20 due to nasal bleeding. PMhx: Pt seen by P.T. 01/25/20 due to T6 compression fx, Lupus, covid 1/13, HTN, sarcoidosis   OT comments  Patient continues to make progress towards goals in skilled OT session. Patient's session encompassed bed level ADLs and bed mobility due to pt wanting to get on bed pan and not attempt to sit EOB or complete toilet transfer as need was "too urgent". Pt now on 11L O2 however sats held at 100 when therapist was present in room. Pt able to wash face with set up, and required max-total A to roll in bed. Therapist emphasized importance of getting up with therapy, to which pt agreed but simply not agreeable at this moment. Will continue to follow acutely.    Follow Up Recommendations  LTACH    Equipment Recommendations  Other (comment)(defer to next venue)    Recommendations for Other Services      Precautions / Restrictions Precautions Precautions: Fall Precaution Comments: Venturi mask with 11O2 Restrictions Weight Bearing Restrictions: No       Mobility Bed Mobility Overal bed mobility: Needs Assistance Bed Mobility: Rolling Rolling: Mod assist;Max assist;+2 for physical assistance;+2 for safety/equipment         General bed mobility comments: pt refuses mobility due to increased O2 demand and pending moving bowels  Transfers                      Balance                                           ADL either performed or assessed with clinical judgement   ADL Overall ADL's : Needs assistance/impaired Eating/Feeding: Set up;Supervision/ safety;Sitting   Grooming: Bed  level;Wash/dry face;Wash/dry hands Grooming Details (indicate cue type and reason): Bed level as pt did not want to sit EOB due to pending bowel movement                 Toilet Transfer: Maximal assistance;+2 for physical assistance;+2 for safety/equipment Toilet Transfer Details (indicate cue type and reason): Bed level with bed pan as patient did not want to attempt [facility]         Functional mobility during ADLs: Maximal assistance;+2 for physical assistance;+2 for safety/equipment;Total assistance General ADL Comments: currently requiring total to max  assist     Vision       Perception     Praxis      Cognition Arousal/Alertness: Awake/alert Behavior During Therapy: WFL for tasks assessed/performed Overall Cognitive Status: Difficult to assess Area of Impairment: Safety/judgement;Problem solving;Following commands                       Following Commands: Follows one step commands consistently Safety/Judgement: Decreased awareness of safety;Decreased awareness of deficits   Problem Solving: Slow processing;Requires verbal cues;Requires tactile cues General Comments: following commands well        Exercises     Shoulder Instructions       General Comments      Pertinent Vitals/ Pain  Pain Assessment: Faces Faces Pain Scale: Hurts a little bit Pain Descriptors / Indicators: Discomfort Pain Intervention(s): Limited activity within patient's tolerance;Monitored during session;Repositioned  Home Living                                          Prior Functioning/Environment              Frequency  Min 2X/week        Progress Toward Goals  OT Goals(current goals can now be found in the care plan section)  Progress towards OT goals: Progressing toward goals  Acute Rehab OT Goals Patient Stated Goal: pt agreeable to working with therapies OT Goal Formulation: With patient Time For Goal Achievement: 03/10/20  Plan  Discharge plan needs to be updated    Co-evaluation                 AM-PAC OT "6 Clicks" Daily Activity     Outcome Measure   Help from another person eating meals?: Total Help from another person taking care of personal grooming?: Total Help from another person toileting, which includes using toliet, bedpan, or urinal?: Total Help from another person bathing (including washing, rinsing, drying)?: A Lot Help from another person to put on and taking off regular upper body clothing?: Total Help from another person to put on and taking off regular lower body clothing?: Total 6 Click Score: 7    End of Session    OT Visit Diagnosis: Muscle weakness (generalized) (M62.81);Other abnormalities of gait and mobility (R26.89)   Activity Tolerance Patient tolerated treatment well;Patient limited by fatigue   Patient Left in bed;with nursing/sitter in room;with bed alarm set   Nurse Communication Mobility status        Time: 7919-9579 OT Time Calculation (min): 14 min  Charges: OT General Charges $OT Visit: 1 Visit OT Treatments $Self Care/Home Management : 8-22 mins  Corinne Ports E. Alizey Noren, COTA/L Acute Rehabilitation Services [place] 03/01/2020, 11:31 AM

## 2020-03-01 NOTE — TOC Transition Note (Signed)
Transition of Care [facility]) - CM/SW Discharge Note   Patient Details  Name: Kellan Raffield MRN: [id number] Date of Birth: 11/06/1951  [facility] CM/SW Contact:  Geralynn Ochs, LCSW Phone Number: 03/01/2020, 12:33 PM   Clinical Narrative:   Nurse to call report to [phone], going to room 5E16. Receiving MD is Dr. Merton Border.     Final next level of care: Long Term Acute Care (LTAC) Barriers to Discharge: Barriers Resolved   Patient Goals and CMS Choice        Discharge Placement              Patient chooses bed at: [facility])   Name of family member notified: Self Patient and family notified of of transfer: 03/01/20  Discharge Plan and Services                                     Social Determinants of Health (SDOH) Interventions     Readmission Risk Interventions No flowsheet data found.

## 2020-03-01 NOTE — Progress Notes (Signed)
Report given to Lurline Hare at [employer]

## 2020-03-02 ENCOUNTER — Other Ambulatory Visit (HOSPITAL_COMMUNITY): Payer: Self-pay

## 2020-03-02 LAB — COMPREHENSIVE METABOLIC PANEL
ALT: 24 U/L (ref 0–44)
AST: 19 U/L (ref 15–41)
Albumin: 2.7 g/dL — ABNORMAL LOW (ref 3.5–5.0)
Alkaline Phosphatase: 60 U/L (ref 38–126)
Anion gap: 9 (ref 5–15)
BUN: 8 mg/dL (ref 8–23)
CO2: 30 mmol/L (ref 22–32)
Calcium: 8.2 mg/dL — ABNORMAL LOW (ref 8.9–10.3)
Chloride: 103 mmol/L (ref 98–111)
Creatinine, Ser: 0.89 mg/dL (ref 0.61–1.24)
GFR calc Af Amer: >60 mL/min (ref >60)
GFR calc non Af Amer: >60 mL/min (ref >60)
Glucose, Bld: 101 mg/dL — ABNORMAL HIGH (ref 70–99)
Potassium: 3 mmol/L — ABNORMAL LOW (ref 3.5–5.1)
Sodium: 142 mmol/L (ref 135–145)
Total Bilirubin: 0.4 mg/dL (ref 0.3–1.2)
Total Protein: 5.6 g/dL — ABNORMAL LOW (ref 6.5–8.1)

## 2020-03-02 LAB — CBC WITH DIFFERENTIAL/PLATELET
Abs Immature Granulocytes: 0.06 10*3/uL (ref 0.00–0.07)
Basophils Absolute: 0 10*3/uL (ref 0.0–0.1)
Basophils Relative: 0 %
Eosinophils Absolute: 0.3 10*3/uL (ref 0.0–0.5)
Eosinophils Relative: 3 %
HCT: 29.1 % — ABNORMAL LOW (ref 39.0–52.0)
Hemoglobin: 9.2 g/dL — ABNORMAL LOW (ref 13.0–17.0)
Immature Granulocytes: 1 %
Lymphocytes Relative: 19 %
Lymphs Abs: 1.8 10*3/uL (ref 0.7–4.0)
MCH: 33.2 pg (ref 26.0–34.0)
MCHC: 31.6 g/dL (ref 30.0–36.0)
MCV: 105.1 fL — ABNORMAL HIGH (ref 80.0–100.0)
Monocytes Absolute: 0.6 10*3/uL (ref 0.1–1.0)
Monocytes Relative: 6 %
Neutro Abs: 6.9 10*3/uL (ref 1.7–7.7)
Neutrophils Relative %: 71 %
Platelets: 301 10*3/uL (ref 150–400)
RBC: 2.77 MIL/uL — ABNORMAL LOW (ref 4.22–5.81)
RDW: 14.9 % (ref 11.5–15.5)
WBC: 9.7 10*3/uL (ref 4.0–10.5)
nRBC: 0 % (ref 0.0–0.2)

## 2020-03-02 LAB — MAGNESIUM: Magnesium: 1.3 mg/dL — ABNORMAL LOW (ref 1.7–2.4)

## 2020-03-02 LAB — PROTIME-INR
INR: 1.7 — ABNORMAL HIGH (ref 0.8–1.2)
Prothrombin Time: 20.1 seconds — ABNORMAL HIGH (ref 11.4–15.2)

## 2020-03-02 LAB — PHOSPHORUS: Phosphorus: 1.9 mg/dL — ABNORMAL LOW (ref 2.5–4.6)

## 2020-03-03 DIAGNOSIS — I2699 Other pulmonary embolism without acute cor pulmonale: Secondary | ICD-10-CM

## 2020-03-03 DIAGNOSIS — D869 Sarcoidosis, unspecified: Secondary | ICD-10-CM

## 2020-03-03 DIAGNOSIS — R040 Epistaxis: Secondary | ICD-10-CM

## 2020-03-03 DIAGNOSIS — J9621 Acute and chronic respiratory failure with hypoxia: Secondary | ICD-10-CM

## 2020-03-03 DIAGNOSIS — U071 COVID-19: Secondary | ICD-10-CM

## 2020-03-03 LAB — MAGNESIUM: Magnesium: 1.5 mg/dL — ABNORMAL LOW (ref 1.7–2.4)

## 2020-03-03 LAB — PROTIME-INR
INR: 1.9 — ABNORMAL HIGH (ref 0.8–1.2)
Prothrombin Time: 21.4 seconds — ABNORMAL HIGH (ref 11.4–15.2)

## 2020-03-03 LAB — POTASSIUM: Potassium: 3.4 mmol/L — ABNORMAL LOW (ref 3.5–5.1)

## 2020-03-03 NOTE — Consult Note (Signed)
Pulmonary [place]  PULMONARY SERVICE  Date of Service: 03/03/2020  PULMONARY CRITICAL CARE CONSULT   Jesus Russell  HWE:[id number]  DOB: May 04, 1951   DOA: 03/01/2020  Referring Physician: Merton Border, MD  HPI: Jesus Russell is a 69 y.o. male seen for follow up of Acute on Chronic Respiratory Failure.  Patient has multiple medical problems including history of sarcoidosis antiphospholipid antibody syndrome pulmonary embolism DVT on chronic Coumadin came into the hospital because of acute epistaxis and pulmonary embolism.  Patient apparently was having a lot of bleeding needed to be intubated ENT did see the patient.  Also patient had an echocardiogram which showed right ventricular failure along with this had DVTs noted.  Patient apparently had been intubated in the past for airway protection.  After intubation epistaxis did improve patient Coumadin was started back on with a goal of 2-3.  As far as from a pulmonary perspective is having issues with wheezing and has been on prednisone along with broncho dilators.  Transferred now to our facility for further management.  Patient's oxygen requirements are minimal right now  Review of Systems:  ROS performed and is unremarkable other than noted above.  Past Medical History:  Diagnosis Date  . DVT of lower extremity, bilateral ([place])   . Hypertension   . Incarcerated umbilical hernia 89/3810   Status post repair  . Lupus anticoagulant positive    Per records from [facility]  . 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 Pulmonary artery hypertension ([place]) 04/2011   Mildly elevated pulmonary artery pressure in setting of PE (from [facility])  . Pulmonary embolism ([place]) 04/2011  . Sarcoidosis 1989  . Subdural hematoma (HCC)    Remote episode, occurred when taking excedrin and warfarin.     Past Surgical History:  Procedure  Laterality Date  . BRAIN SURGERY    . HERNIA REPAIR    . NASAL ENDOSCOPY WITH EPISTAXIS CONTROL N/A 02/24/2020   Procedure: NASAL ENDOSCOPY WITH EPISTAXIS CONTROL;  Surgeon: [name], MD;  Location: [facility];  Service: ENT;  Laterality: N/A;    Social History:    reports that he quit smoking about 26 years ago. His smoking use included cigarettes. He has a 34.50 pack-year smoking history. He has never used smokeless tobacco. He reports previous drug use. Drug: "Crack" cocaine. He reports that he does not drink alcohol.  Family History: Non-Contributory to the present illness  No Known Allergies  Medications: Reviewed on Rounds  Physical Exam:  Vitals: Temperature is 98.3 pulse 81 respiratory rate 28 blood pressure is 153/85 saturations 95%  Ventilator Settings currently is off of oxygen  . General: Comfortable at this time . Eyes: Grossly normal lids, irises & conjunctiva . ENT: grossly tongue is normal . Neck: no obvious mass . Cardiovascular: S1-S2 normal no gallop or rub . Respiratory: Coarse breath sounds scattered rhonchi . Abdomen: Soft nontender . Skin: no rash seen on limited exam . Musculoskeletal: not rigid . Psychiatric:unable to assess . Neurologic: no seizure no involuntary movements         Labs on Admission:  Basic Metabolic Panel: Recent Labs  Lab 02/27/20 0227 02/27/20 0227 02/28/20 0610 02/29/20 0635 03/01/20 0046 03/02/20 0539 03/03/20 0833  NA 144  --  143 143 142 142  --   K 3.5   < > 3.5 3.1* 3.5 3.0* 3.4*  CL 105  --  109 105 103 103  --   CO2 30  --  _0 --   GLUCOSE 115*  --  102* 111* 81 101*  --   BUN 32*  --  _1 --   CREATININE 1.30*  --  1.01 0.99 0.98 0.89  --   CALCIUM 8.7*  --  8.6* 8.4* 8.4* 8.2*  --   MG  --   --   --   --   --  1.3* 1.5*  PHOS  --   --   --   --   --  1.9*  --    < > = values in this interval not displayed.    No results for input(s): PHART, PCO2ART, PO2ART, HCO3, O2SAT in the last 168  hours.  Liver Function Tests: Recent Labs  Lab 03/02/20 0539  AST 19  ALT 24  ALKPHOS 60  BILITOT 0.4  PROT 5.6*  ALBUMIN 2.7*   No results for input(s): LIPASE, AMYLASE in the last 168 hours. No results for input(s): AMMONIA in the last 168 hours.  CBC: Recent Labs  Lab 02/27/20 0227 02/27/20 0227 02/28/20 0610 02/29/20 0635 02/29/20 0835 03/01/20 0046 03/02/20 0539  WBC 12.2*  --  10.5 10.2  --  13.3* 9.7  NEUTROABS  --   --   --   --   --   --  6.9  HGB 9.1*  --  10.2* 9.1*  --  9.8* 9.2*  HCT 30.1*   < > 34.5* 29.9* 29.2* 31.8* 29.1*  MCV 109.1*  --  111.7* 107.6*  --  106.7* 105.1*  PLT 376  --  293 258  --  283 301   < > = values in this interval not displayed.    Cardiac Enzymes: No results for input(s): CKTOTAL, CKMB, CKMBINDEX, TROPONINI in the last 168 hours.  BNP (last 3 results) Recent Labs    01/24/20 1616 02/29/20 0835  BNP 67.4 111.4*    ProBNP (last 3 results) No results for input(s): PROBNP in the last 8760 hours.   Radiological Exams on Admission: DG CHEST PORT 1 VIEW  Result Date: 03/02/2020 CLINICAL DATA:  Respiratory failure. History of sarcoidosis EXAM: PORTABLE CHEST 1 VIEW COMPARISON:  03/01/2020 FINDINGS: The cardiac silhouette, mediastinal and hilar contours are stable. Stable underlying chronic lung disease with pulmonary fibrosis consistent with known sarcoidosis. No obvious acute overlying pulmonary process. IMPRESSION: Stable chronic lung disease consistent with known sarcoidosis. No definite superimposed infiltrates. Electronically Signed   By: Marijo Sanes M.D.   On: 03/02/2020 07:02   DG Chest Port 1 View  Result Date: 03/01/2020 CLINICAL DATA:  Sarcoidosis. Dyspnea. EXAM: PORTABLE CHEST 1 VIEW COMPARISON:  02/28/2020 chest radiograph. FINDINGS: Stable cardiomediastinal silhouette with normal heart size. No pneumothorax. Stable mild blunting of the costophrenic angles bilaterally, compatible with pleural-parenchymal scarring as  seen on 02/14/2020 chest CT. Patchy irregular and reticular opacities with parenchymal distortion throughout both lungs with an upper lung predominance, not appreciably changed. IMPRESSION: Stable chest radiograph with patchy irregular and reticular opacities with parenchymal distortion throughout both lungs with an upper lung predominance, compatible with known sarcoidosis. Electronically Signed   By: Ilona Sorrel M.D.   On: 03/01/2020 09:33    Assessment/Plan Active Problems:   Sarcoidosis   Acute on chronic respiratory failure with hypoxia (HCC)   Epistaxis   COVID-19 virus infection   Pulmonary embolism associated with COVID-19 ([place])   1. Acute on  chronic respiratory failure hypoxia patient has significant issues with upper airways but is likely not requiring any oxygen.  Patient does have a history of sarcoidosis which has been basically stable.  We will continue to monitor needs for oxygen therapy. 2. Epistaxis patient had significant epistaxis was seen by ENT had nasal cautery done now appears to be doing better.  We will continue to monitor for any further bleeding closely. 3. Sarcoidosis this has been stable we will continue to monitor patient has been on steroids also. 4. COVID-19 virus infection in resolution phase plan is to continue to monitor 5. Pulmonary embolism stable patient's hemodynamics are stable we will continue to monitor  I have personally seen and evaluated the patient, evaluated laboratory and imaging results, formulated the assessment and plan and placed orders. The Patient requires high complexity decision making with multiple systems involvement.  Case was discussed on Rounds with the Respiratory Therapy Director and the Respiratory staff Time Spent 37mnutes  Saleha Kalp A Thaison Kolodziejski, MD FGreeley County HospitalPulmonary Critical Care Medicine Sleep Medicine

## 2020-03-04 ENCOUNTER — Other Ambulatory Visit (HOSPITAL_COMMUNITY): Payer: Self-pay

## 2020-03-04 ENCOUNTER — Encounter: Payer: Self-pay | Admitting: Internal Medicine

## 2020-03-04 DIAGNOSIS — D869 Sarcoidosis, unspecified: Secondary | ICD-10-CM | POA: Diagnosis present

## 2020-03-04 DIAGNOSIS — Z8616 Personal history of COVID-19: Secondary | ICD-10-CM | POA: Diagnosis present

## 2020-03-04 DIAGNOSIS — R040 Epistaxis: Secondary | ICD-10-CM | POA: Diagnosis present

## 2020-03-04 DIAGNOSIS — U071 COVID-19: Secondary | ICD-10-CM | POA: Diagnosis present

## 2020-03-04 DIAGNOSIS — J9610 Chronic respiratory failure, unspecified whether with hypoxia or hypercapnia: Secondary | ICD-10-CM | POA: Diagnosis present

## 2020-03-04 DIAGNOSIS — J9621 Acute and chronic respiratory failure with hypoxia: Secondary | ICD-10-CM | POA: Diagnosis present

## 2020-03-04 LAB — POTASSIUM: Potassium: 4.1 mmol/L (ref 3.5–5.1)

## 2020-03-04 LAB — PROTIME-INR
INR: 2.2 — ABNORMAL HIGH (ref 0.8–1.2)
Prothrombin Time: 23.9 seconds — ABNORMAL HIGH (ref 11.4–15.2)

## 2020-03-04 LAB — MAGNESIUM: Magnesium: 1.8 mg/dL (ref 1.7–2.4)

## 2020-03-04 NOTE — Progress Notes (Addendum)
Pulmonary Critical Care Medicine [place]   PULMONARY CRITICAL CARE SERVICE  PROGRESS NOTE  Date of Service: 03/04/2020  Jesus Russell  PXO:[id number]  DOB: [birth year]   DOA: 03/01/2020  Referring Physician: Merton Border, MD  HPI: Jesus Russell is a 69 y.o. male seen for follow up of Acute on Chronic Respiratory Failure.  Patient was placed on nasal cannula and had some desaturations noted apparently.  Now seems to be doing a little bit better on oxygen  Medications: Reviewed on Rounds  Physical Exam:  Vitals: Temperature is 97.4 pulse 89 respiratory rate 35 blood pressure is 180/85 saturations 100%  Ventilator Settings on nasal cannula  . General: Comfortable at this time . Eyes: Grossly normal lids, irises & conjunctiva . ENT: grossly tongue is normal . Neck: no obvious mass . Cardiovascular: S1 S2 normal no gallop . Respiratory: No rhonchi no rales are noted at this time . Abdomen: soft . Skin: no rash seen on limited exam . Musculoskeletal: not rigid . Psychiatric:unable to assess . Neurologic: no seizure no involuntary movements         Lab Data:   Basic Metabolic Panel: Recent Labs  Lab 02/27/20 0227 02/27/20 0227 02/28/20 0610 02/28/20 0610 02/29/20 0635 03/01/20 0046 03/02/20 0539 03/03/20 0833 03/04/20 0606  NA 144  --  143  --  143 142 142  --   --   K 3.5   < > 3.5   < > 3.1* 3.5 3.0* 3.4* 4.1  CL 105  --  109  --  105 103 103  --   --   CO2 30  --  24  --  _0 --   --   GLUCOSE 115*  --  102*  --  111* 81 101*  --   --   BUN 32*  --  20  --  _1 --   --   CREATININE 1.30*  --  1.01  --  0.99 0.98 0.89  --   --   CALCIUM 8.7*  --  8.6*  --  8.4* 8.4* 8.2*  --   --   MG  --   --   --   --   --   --  1.3* 1.5* 1.8  PHOS  --   --   --   --   --   --  1.9*  --   --    < > = values in this interval not displayed.    ABG: No results for input(s): PHART, PCO2ART, PO2ART, HCO3, O2SAT in the last 168 hours.  Liver  Function Tests: Recent Labs  Lab 03/02/20 0539  AST 19  ALT 24  ALKPHOS 60  BILITOT 0.4  PROT 5.6*  ALBUMIN 2.7*   No results for input(s): LIPASE, AMYLASE in the last 168 hours. No results for input(s): AMMONIA in the last 168 hours.  CBC: Recent Labs  Lab 02/27/20 0227 02/27/20 0227 02/28/20 0610 02/29/20 0635 02/29/20 0835 03/01/20 0046 03/02/20 0539  WBC 12.2*  --  10.5 10.2  --  13.3* 9.7  NEUTROABS  --   --   --   --   --   --  6.9  HGB 9.1*  --  10.2* 9.1*  --  9.8* 9.2*  HCT 30.1*   < > 34.5* 29.9* 29.2* 31.8* 29.1*  MCV 109.1*  --  111.7* 107.6*  --  106.7* 105.1*  PLT 376  --  293 258  --  283 301   < > = values in this interval not displayed.    Cardiac Enzymes: No results for input(s): CKTOTAL, CKMB, CKMBINDEX, TROPONINI in the last 168 hours.  BNP (last 3 results) Recent Labs    01/24/20 1616 02/29/20 0835  BNP 67.4 111.4*    ProBNP (last 3 results) No results for input(s): PROBNP in the last 8760 hours.  Radiological Exams: DG CHEST PORT 1 VIEW  Result Date: 03/04/2020 CLINICAL DATA:  Respiratory distress. COVID positive on 03/01/2020 EXAM: PORTABLE CHEST 1 VIEW COMPARISON:  03/02/2020 FINDINGS: Cardiomediastinal contours are stable. Signs of chronic lung disease with apical predominance as on the previous study. New opacity as compared to the February 23rd exam in the right lower lobe. Visualized skeletal structures are unremarkable. IMPRESSION: New right lower lobe opacity or accentuation of existing airspace process since previous studies. Infection in this location is considered, superimposed on findings of chronic interstitial changes related to sarcoid. Electronically Signed   By: Zetta Bills M.D.   On: 03/04/2020 09:16    Assessment/Plan Active Problems:   Sarcoidosis   Acute on chronic respiratory failure with hypoxia (HCC)   Epistaxis   COVID-19 virus infection   Pulmonary embolism associated with COVID-19 ([place])   1. Acute on  chronic respiratory failure hypoxia at this time patient will be continued on very low flow oxygen.  Need to monitor closely for any episodes of bleeding. 2. Epistaxis no active bleeding is noted at this time 3. Pulmonary embolism we will continue to monitor. 4. COVID-19 virus infection treated we will continue with supportive care 5. Sarcoidosis at baseline   I have personally seen and evaluated the patient, evaluated laboratory and imaging results, formulated the assessment and plan and placed orders. The Patient requires high complexity decision making with multiple systems involvement.  Rounds were done with the Respiratory Therapy Director and Staff therapists and discussed with nursing staff also.  Allyne Gee, MD [facility] Pulmonary Critical Care Medicine Sleep Medicine

## 2020-03-05 LAB — BASIC METABOLIC PANEL
Anion gap: 12 (ref 5–15)
BUN: 12 mg/dL (ref 8–23)
CO2: 31 mmol/L (ref 22–32)
Calcium: 8.8 mg/dL — ABNORMAL LOW (ref 8.9–10.3)
Chloride: 95 mmol/L — ABNORMAL LOW (ref 98–111)
Creatinine, Ser: 1.02 mg/dL (ref 0.61–1.24)
GFR calc Af Amer: >60 mL/min (ref >60)
GFR calc non Af Amer: >60 mL/min (ref >60)
Glucose, Bld: 119 mg/dL — ABNORMAL HIGH (ref 70–99)
Potassium: 4.4 mmol/L (ref 3.5–5.1)
Sodium: 138 mmol/L (ref 135–145)

## 2020-03-05 LAB — PROTIME-INR
INR: 2.3 — ABNORMAL HIGH (ref 0.8–1.2)
Prothrombin Time: 24.8 seconds — ABNORMAL HIGH (ref 11.4–15.2)

## 2020-03-05 LAB — CBC
HCT: 34.6 % — ABNORMAL LOW (ref 39.0–52.0)
Hemoglobin: 10.9 g/dL — ABNORMAL LOW (ref 13.0–17.0)
MCH: 33 pg (ref 26.0–34.0)
MCHC: 31.5 g/dL (ref 30.0–36.0)
MCV: 104.8 fL — ABNORMAL HIGH (ref 80.0–100.0)
Platelets: 364 10*3/uL (ref 150–400)
RBC: 3.3 MIL/uL — ABNORMAL LOW (ref 4.22–5.81)
RDW: 15.2 % (ref 11.5–15.5)
WBC: 11.4 10*3/uL — ABNORMAL HIGH (ref 4.0–10.5)
nRBC: 0 % (ref 0.0–0.2)

## 2020-03-05 LAB — MAGNESIUM: Magnesium: 1.8 mg/dL (ref 1.7–2.4)

## 2020-03-05 NOTE — Progress Notes (Signed)
Pulmonary Critical Care Medicine [place]   PULMONARY CRITICAL CARE SERVICE  PROGRESS NOTE  Date of Service: 03/05/2020  Russell DENZ  KVQ:[id number]  DOB: 1951/03/19   DOA: 03/01/2020  Referring Physician: Merton Border, MD  HPI: Jesus Russell is a 69 y.o. male seen for follow up of Acute on Chronic Respiratory Failure.  Patient at this time is on room air doing fairly well.  Medications: Reviewed on Rounds  Physical Exam:  Vitals: Temperature is 97.6 pulse 86 respiratory 35 blood pressure is 183/99 saturations 99%  Ventilator Settings off ventilator on room air  . General: Comfortable at this time . Eyes: Grossly normal lids, irises & conjunctiva . ENT: grossly tongue is normal . Neck: no obvious mass . Cardiovascular: S1 S2 normal no gallop . Respiratory: No rhonchi no rales are noted at this time . Abdomen: soft . Skin: no rash seen on limited exam . Musculoskeletal: not rigid . Psychiatric:unable to assess . Neurologic: no seizure no involuntary movements         Lab Data:   Basic Metabolic Panel: Recent Labs  Lab 02/28/20 0610 02/28/20 0610 02/29/20 6433 02/29/20 0635 03/01/20 0046 03/02/20 0539 03/03/20 0833 03/04/20 0606 03/05/20 0435  NA 143  --  143  --  142 142  --   --  138  K 3.5   < > 3.1*   < > 3.5 3.0* 3.4* 4.1 4.4  CL 109  --  105  --  103 103  --   --  95*  CO2 24  --  29  --  27 30  --   --  31  GLUCOSE 102*  --  111*  --  81 101*  --   --  119*  BUN 20  --  12  --  10 8  --   --  12  CREATININE 1.01  --  0.99  --  0.98 0.89  --   --  1.02  CALCIUM 8.6*  --  8.4*  --  8.4* 8.2*  --   --  8.8*  MG  --   --   --   --   --  1.3* 1.5* 1.8 1.8  PHOS  --   --   --   --   --  1.9*  --   --   --    < > = values in this interval not displayed.    ABG: No results for input(s): PHART, PCO2ART, PO2ART, HCO3, O2SAT in the last 168 hours.  Liver Function Tests: Recent Labs  Lab 03/02/20 0539  AST 19  ALT 24  ALKPHOS 60   BILITOT 0.4  PROT 5.6*  ALBUMIN 2.7*   No results for input(s): LIPASE, AMYLASE in the last 168 hours. No results for input(s): AMMONIA in the last 168 hours.  CBC: Recent Labs  Lab 02/28/20 0610 02/28/20 0610 02/29/20 0635 02/29/20 0835 03/01/20 0046 03/02/20 0539 03/05/20 0435  WBC 10.5  --  10.2  --  13.3* 9.7 11.4*  NEUTROABS  --   --   --   --   --  6.9  --   HGB 10.2*  --  9.1*  --  9.8* 9.2* 10.9*  HCT 34.5*   < > 29.9* 29.2* 31.8* 29.1* 34.6*  MCV 111.7*  --  107.6*  --  106.7* 105.1* 104.8*  PLT 293  --  258  --  283 301 364   < > =  values in this interval not displayed.    Cardiac Enzymes: No results for input(s): CKTOTAL, CKMB, CKMBINDEX, TROPONINI in the last 168 hours.  BNP (last 3 results) Recent Labs    01/24/20 1616 02/29/20 0835  BNP 67.4 111.4*    ProBNP (last 3 results) No results for input(s): PROBNP in the last 8760 hours.  Radiological Exams: DG CHEST PORT 1 VIEW  Result Date: 03/04/2020 CLINICAL DATA:  Respiratory distress. COVID positive on 03/01/2020 EXAM: PORTABLE CHEST 1 VIEW COMPARISON:  03/02/2020 FINDINGS: Cardiomediastinal contours are stable. Signs of chronic lung disease with apical predominance as on the previous study. New opacity as compared to the February 23rd exam in the right lower lobe. Visualized skeletal structures are unremarkable. IMPRESSION: New right lower lobe opacity or accentuation of existing airspace process since previous studies. Infection in this location is considered, superimposed on findings of chronic interstitial changes related to sarcoid. Electronically Signed   By: Zetta Bills M.D.   On: 03/04/2020 09:16    Assessment/Plan Active Problems:   Sarcoidosis   Acute on chronic respiratory failure with hypoxia (HCC)   Epistaxis   COVID-19 virus infection   Pulmonary embolism associated with COVID-19 ([place])   1. Acute on chronic respiratory failure hypoxia continue with oxygen as  necessary 2. Sarcoidosis at baseline 3. Epistaxis no further bleeding 4. COVID-19 infection treated improving 5. Pulmonary embolism supportive care we will continue to monitor   I have personally seen and evaluated the patient, evaluated laboratory and imaging results, formulated the assessment and plan and placed orders. The Patient requires high complexity decision making with multiple systems involvement.  Rounds were done with the Respiratory Therapy Director and Staff therapists and discussed with nursing staff also.  Allyne Gee, MD [facility] Pulmonary Critical Care Medicine Sleep Medicine

## 2020-03-06 LAB — PROTIME-INR
INR: 2.2 — ABNORMAL HIGH (ref 0.8–1.2)
Prothrombin Time: 24.1 seconds — ABNORMAL HIGH (ref 11.4–15.2)

## 2020-03-06 NOTE — Progress Notes (Signed)
Pulmonary Critical Care Medicine [place]   PULMONARY CRITICAL CARE SERVICE  PROGRESS NOTE  Date of Service: 03/06/2020  Jesus Russell  [id number]  DOB: 1951/05/14   DOA: 03/01/2020  Referring Physician: Merton Border, MD  HPI: Jesus Russell is a 69 y.o. male seen for follow up of Acute on Chronic Respiratory Failure.  Patient is doing fine on room air right now comfortable without distress no active bleeding noted from the nose  Medications: Reviewed on Rounds  Physical Exam:  Vitals: Temperature is 97.1 pulse 81 respiratory rate 21 blood pressure is 115/81 saturations 98%  Ventilator Settings off the ventilator on room air  . General: Comfortable at this time . Eyes: Grossly normal lids, irises & conjunctiva . ENT: grossly tongue is normal . Neck: no obvious mass . Cardiovascular: S1 S2 normal no gallop . Respiratory: No rhonchi coarse breath sounds . Abdomen: soft . Skin: no rash seen on limited exam . Musculoskeletal: not rigid . Psychiatric:unable to assess . Neurologic: no seizure no involuntary movements         Lab Data:   Basic Metabolic Panel: Recent Labs  Lab 02/29/20 0635 02/29/20 0635 03/01/20 0046 03/02/20 0539 03/03/20 0833 03/04/20 0606 03/05/20 0435  NA 143  --  142 142  --   --  138  K 3.1*   < > 3.5 3.0* 3.4* 4.1 4.4  CL 105  --  103 103  --   --  95*  CO2 29  --  27 30  --   --  31  GLUCOSE 111*  --  81 101*  --   --  119*  BUN 12  --  10 8  --   --  12  CREATININE 0.99  --  0.98 0.89  --   --  1.02  CALCIUM 8.4*  --  8.4* 8.2*  --   --  8.8*  MG  --   --   --  1.3* 1.5* 1.8 1.8  PHOS  --   --   --  1.9*  --   --   --    < > = values in this interval not displayed.    ABG: No results for input(s): PHART, PCO2ART, PO2ART, HCO3, O2SAT in the last 168 hours.  Liver Function Tests: Recent Labs  Lab 03/02/20 0539  AST 19  ALT 24  ALKPHOS 60  BILITOT 0.4  PROT 5.6*  ALBUMIN 2.7*   No results for input(s):  LIPASE, AMYLASE in the last 168 hours. No results for input(s): AMMONIA in the last 168 hours.  CBC: Recent Labs  Lab 02/29/20 0635 02/29/20 0835 03/01/20 0046 03/02/20 0539 03/05/20 0435  WBC 10.2  --  13.3* 9.7 11.4*  NEUTROABS  --   --   --  6.9  --   HGB 9.1*  --  9.8* 9.2* 10.9*  HCT 29.9* 29.2* 31.8* 29.1* 34.6*  MCV 107.6*  --  106.7* 105.1* 104.8*  PLT 258  --  283 301 364    Cardiac Enzymes: No results for input(s): CKTOTAL, CKMB, CKMBINDEX, TROPONINI in the last 168 hours.  BNP (last 3 results) Recent Labs    01/24/20 1616 02/29/20 0835  BNP 67.4 111.4*    ProBNP (last 3 results) No results for input(s): PROBNP in the last 8760 hours.  Radiological Exams: No results found.  Assessment/Plan Active Problems:   Sarcoidosis   Acute on chronic respiratory failure with hypoxia (HCC)   Epistaxis  COVID-19 virus infection   Pulmonary embolism associated with COVID-19 ([place])   1. Acute on chronic respiratory failure hypoxia we will continue with supportive care oxygen as necessary 2. Epistaxis no active bleeding noted 3. Sarcoidosis at baseline 4. COVID-19 virus infection in resolution phase 5. Pulmonary embolism treated we will continue to monitor   I have personally seen and evaluated the patient, evaluated laboratory and imaging results, formulated the assessment and plan and placed orders. The Patient requires high complexity decision making with multiple systems involvement.  Rounds were done with the Respiratory Therapy Director and Staff therapists and discussed with nursing staff also.  Allyne Gee, MD [facility] Pulmonary Critical Care Medicine Sleep Medicine

## 2020-03-07 ENCOUNTER — Other Ambulatory Visit (HOSPITAL_COMMUNITY): Payer: Self-pay

## 2020-03-07 LAB — BASIC METABOLIC PANEL
Anion gap: 9 (ref 5–15)
BUN: 19 mg/dL (ref 8–23)
CO2: 29 mmol/L (ref 22–32)
Calcium: 8.9 mg/dL (ref 8.9–10.3)
Chloride: 97 mmol/L — ABNORMAL LOW (ref 98–111)
Creatinine, Ser: 1.01 mg/dL (ref 0.61–1.24)
GFR calc Af Amer: >60 mL/min (ref >60)
GFR calc non Af Amer: >60 mL/min (ref >60)
Glucose, Bld: 108 mg/dL — ABNORMAL HIGH (ref 70–99)
Potassium: 3.3 mmol/L — ABNORMAL LOW (ref 3.5–5.1)
Sodium: 135 mmol/L (ref 135–145)

## 2020-03-07 LAB — PROTIME-INR
INR: 2.2 — ABNORMAL HIGH (ref 0.8–1.2)
Prothrombin Time: 24.6 seconds — ABNORMAL HIGH (ref 11.4–15.2)

## 2020-03-07 LAB — CBC
HCT: 36.4 % — ABNORMAL LOW (ref 39.0–52.0)
Hemoglobin: 11.6 g/dL — ABNORMAL LOW (ref 13.0–17.0)
MCH: 33 pg (ref 26.0–34.0)
MCHC: 31.9 g/dL (ref 30.0–36.0)
MCV: 103.4 fL — ABNORMAL HIGH (ref 80.0–100.0)
Platelets: 403 10*3/uL — ABNORMAL HIGH (ref 150–400)
RBC: 3.52 MIL/uL — ABNORMAL LOW (ref 4.22–5.81)
RDW: 14.9 % (ref 11.5–15.5)
WBC: 10.9 10*3/uL — ABNORMAL HIGH (ref 4.0–10.5)
nRBC: 0 % (ref 0.0–0.2)

## 2020-03-07 LAB — MAGNESIUM: Magnesium: 1.9 mg/dL (ref 1.7–2.4)

## 2020-03-08 ENCOUNTER — Other Ambulatory Visit (HOSPITAL_COMMUNITY): Payer: Self-pay

## 2020-03-08 LAB — BASIC METABOLIC PANEL
Anion gap: 13 (ref 5–15)
BUN: 21 mg/dL (ref 8–23)
CO2: 27 mmol/L (ref 22–32)
Calcium: 9.1 mg/dL (ref 8.9–10.3)
Chloride: 98 mmol/L (ref 98–111)
Creatinine, Ser: 1.11 mg/dL (ref 0.61–1.24)
GFR calc Af Amer: >60 mL/min (ref >60)
GFR calc non Af Amer: >60 mL/min (ref >60)
Glucose, Bld: 94 mg/dL (ref 70–99)
Potassium: 3.4 mmol/L — ABNORMAL LOW (ref 3.5–5.1)
Sodium: 138 mmol/L (ref 135–145)

## 2020-03-08 LAB — PROTIME-INR
INR: 2.2 — ABNORMAL HIGH (ref 0.8–1.2)
Prothrombin Time: 24.6 seconds — ABNORMAL HIGH (ref 11.4–15.2)

## 2020-03-09 LAB — EXPECTORATED SPUTUM ASSESSMENT W GRAM STAIN, RFLX TO RESP C

## 2020-03-09 LAB — PROTIME-INR
INR: 2.4 — ABNORMAL HIGH (ref 0.8–1.2)
Prothrombin Time: 26.3 seconds — ABNORMAL HIGH (ref 11.4–15.2)

## 2020-03-09 LAB — POTASSIUM: Potassium: 3.6 mmol/L (ref 3.5–5.1)

## 2020-03-10 LAB — PROTIME-INR
INR: 2 — ABNORMAL HIGH (ref 0.8–1.2)
Prothrombin Time: 22.8 seconds — ABNORMAL HIGH (ref 11.4–15.2)

## 2020-03-10 LAB — SARS CORONAVIRUS 2 BY RT PCR (DIASORIN): SARS Coronavirus 2: NEGATIVE

## 2020-03-11 LAB — PROTIME-INR
INR: 2 — ABNORMAL HIGH (ref 0.8–1.2)
Prothrombin Time: 22.2 seconds — ABNORMAL HIGH (ref 11.4–15.2)

## 2020-03-12 LAB — CULTURE, RESPIRATORY W GRAM STAIN

## 2020-03-17 ENCOUNTER — Other Ambulatory Visit: Payer: Self-pay | Admitting: Internal Medicine

## 2020-03-17 DIAGNOSIS — D869 Sarcoidosis, unspecified: Secondary | ICD-10-CM

## 2020-03-24 ENCOUNTER — Telehealth: Payer: Self-pay | Admitting: Internal Medicine

## 2020-03-24 NOTE — Telephone Encounter (Signed)
Rtc, lm for rtc 

## 2020-03-24 NOTE — Telephone Encounter (Signed)
Need VO [phone] for [facility]

## 2020-03-29 ENCOUNTER — Other Ambulatory Visit: Payer: Self-pay | Admitting: Internal Medicine

## 2020-03-29 ENCOUNTER — Ambulatory Visit
Admission: RE | Admit: 2020-03-29 | Discharge: 2020-03-29 | Disposition: A | Discharge status: Home / Self Care | Payer: Self-pay | Source: Ambulatory Visit | Attending: *Deleted | Admitting: *Deleted

## 2020-03-29 ENCOUNTER — Telehealth: Payer: Self-pay | Admitting: Internal Medicine

## 2020-03-29 DIAGNOSIS — S22000S Wedge compression fracture of unspecified thoracic vertebra, sequela: Secondary | ICD-10-CM

## 2020-03-29 DIAGNOSIS — S22050K Wedge compression fracture of T5-T6 vertebra, subsequent encounter for fracture with nonunion: Secondary | ICD-10-CM

## 2020-03-29 NOTE — Telephone Encounter (Signed)
Will with home health [phone] VO

## 2020-03-29 NOTE — Telephone Encounter (Signed)
OT 2x week for 1 week 1x week for 2 weeks For safety, ADL's per self, managing environment easier Do you agree?

## 2020-03-31 ENCOUNTER — Other Ambulatory Visit: Payer: Self-pay

## 2020-03-31 ENCOUNTER — Other Ambulatory Visit: Payer: Self-pay | Admitting: Internal Medicine

## 2020-03-31 ENCOUNTER — Ambulatory Visit (INDEPENDENT_AMBULATORY_CARE_PROVIDER_SITE_OTHER): Payer: Medicare Other | Admitting: Pharmacist

## 2020-03-31 ENCOUNTER — Ambulatory Visit (INDEPENDENT_AMBULATORY_CARE_PROVIDER_SITE_OTHER): Payer: Medicare Other | Admitting: Internal Medicine

## 2020-03-31 ENCOUNTER — Encounter: Payer: Self-pay | Admitting: Internal Medicine

## 2020-03-31 VITALS — BP 132/88 | HR 94 | Temp 98.0°F | Ht 69.0 in | Wt 214.6 lb

## 2020-03-31 DIAGNOSIS — Z86711 Personal history of pulmonary embolism: Secondary | ICD-10-CM | POA: Diagnosis not present

## 2020-03-31 DIAGNOSIS — I10 Essential (primary) hypertension: Secondary | ICD-10-CM | POA: Diagnosis not present

## 2020-03-31 DIAGNOSIS — Z5181 Encounter for therapeutic drug level monitoring: Secondary | ICD-10-CM

## 2020-03-31 DIAGNOSIS — I2699 Other pulmonary embolism without acute cor pulmonale: Secondary | ICD-10-CM

## 2020-03-31 DIAGNOSIS — S22050K Wedge compression fracture of T5-T6 vertebra, subsequent encounter for fracture with nonunion: Secondary | ICD-10-CM

## 2020-03-31 DIAGNOSIS — M8080XA Other osteoporosis with current pathological fracture, unspecified site, initial encounter for fracture: Secondary | ICD-10-CM

## 2020-03-31 DIAGNOSIS — Z7901 Long term (current) use of anticoagulants: Secondary | ICD-10-CM

## 2020-03-31 DIAGNOSIS — R600 Localized edema: Secondary | ICD-10-CM | POA: Diagnosis not present

## 2020-03-31 DIAGNOSIS — Z86718 Personal history of other venous thrombosis and embolism: Secondary | ICD-10-CM | POA: Diagnosis not present

## 2020-03-31 DIAGNOSIS — D869 Sarcoidosis, unspecified: Secondary | ICD-10-CM

## 2020-03-31 LAB — POCT INR: INR: 2 (ref 2.0–3.0)

## 2020-03-31 MED ORDER — OMEGA-3-ACID ETHYL ESTERS 1 G PO CAPS: 1.0000 g | ORAL_CAPSULE | Freq: Two times a day (BID) | ORAL | 3 refills | Status: DC

## 2020-03-31 MED ORDER — AMLODIPINE BESYLATE 5 MG PO TABS: 5.0000 mg | ORAL_TABLET | Freq: Every day | ORAL | 11 refills | Status: DC

## 2020-03-31 MED ORDER — ALENDRONATE SODIUM 70 MG PO TABS: 70.0000 mg | ORAL_TABLET | ORAL | 3 refills | Status: DC

## 2020-03-31 MED ORDER — POTASSIUM CHLORIDE ER 20 MEQ PO TBCR: 20.0000 meq | EXTENDED_RELEASE_TABLET | Freq: Every day | ORAL | 0 refills | Status: DC

## 2020-03-31 MED ORDER — OMEPRAZOLE 40 MG PO CPDR: 40.0000 mg | DELAYED_RELEASE_CAPSULE | Freq: Every day | ORAL | 3 refills | Status: DC

## 2020-03-31 MED ORDER — FUROSEMIDE 20 MG PO TABS: 20.0000 mg | ORAL_TABLET | Freq: Every day | ORAL | 0 refills | Status: DC | PRN

## 2020-03-31 MED ORDER — PREDNISONE 20 MG PO TABS: 20.0000 mg | ORAL_TABLET | Freq: Every day | ORAL | 0 refills | Status: DC

## 2020-03-31 NOTE — Assessment & Plan Note (Addendum)
BP Readings from Last 3 Encounters:  03/31/20 132/88  03/01/20 (!) 146/83  02/03/20 132/85   Blood pressure well controlled, continue current regimen. *Amlodipine 5 mg daily *Losartan-HCTZ 50-12.5 mg daily

## 2020-03-31 NOTE — Assessment & Plan Note (Addendum)
Patient reports continued, stable respiratory symptoms. Reports it takes him about 5 minutes to walk about 12 steps to his home. Patient states his physical conditioning is improving with physical therapy but his respiratory capacity is not. Patient states he last saw his pulmonologist in November.  Plan: *Continue prednisone 20 mg daily *Followup with pulmonology for management of prednisone taper and further drug therapy *Continue famotidine for acid suppression

## 2020-03-31 NOTE — Assessment & Plan Note (Addendum)
Patient with bilateral 2+ lower extremity edema (left > right). Patient has tried compression stockings and elevation without benefit. On review of encounter from February, echocardiogram was ordered which revealed no evidence for heart failure. There is no significant evidence for liver dysfunction on chemistries. Patient has previously been trialed on PRN lasix with resolution of symptoms.  Plan: *Lasix 20 mg daily PRN for symptoms *Will check BMP today to evaluate electrolytes/renal function

## 2020-03-31 NOTE — Patient Instructions (Addendum)
You were seen for followup after your hospitalization. Here are my recommendations:  1) Please continue taking all your medications as prescribed  2) We have sent you prescription for alendronate which can help prevent future fractures  3) For the swelling in your legs, please take lasix as needed  4) Please call your pulmonologist for a followup appointment   Thank you for allowing [place] to be part of your medical care!

## 2020-03-31 NOTE — Patient Instructions (Signed)
Patient instructed to take medications as defined in the Anti-coagulation Track section of this encounter.  Patient instructed to take today's dose.  Patient instructed to take one (1) tablet of your 20m strength blue-warfarin tablet by mouth daily at 6PM--EXCEPT on Mondays and Thursdays, take one-and-one-half (1 & 1/2) of your  425mstrength blue-warfarin tablets on these days.  Patient verbalized understanding of these instructions.

## 2020-03-31 NOTE — Assessment & Plan Note (Signed)
Patient has appointment later this month with Dr. Vernard Gambles for consideration of kyphoplasty

## 2020-03-31 NOTE — Assessment & Plan Note (Addendum)
Patient with fragility compression fracture. Patient was prescribed alendronate previously but had stopped taking it due to confusion over dosing. Patient counseled on dosing and benefits of this medication. Continue alendronate 70 mg once every 7 days

## 2020-03-31 NOTE — Progress Notes (Signed)
   CC: Hospital followup  HPI: Patient is a 69 year old male with past medical history significant for pulmonary sarcoidosis, multiple DVTs/PEs, hypertension who presents for followup after hospitalization for pulmonary embolism with hospital course complicated by ICU stay.  Mr.Sire Viona Gilmore Tith is a 69 y.o.   Past Medical History:  Diagnosis Date  . Acute on chronic respiratory failure with hypoxia ([place])   . COVID-19 virus infection   . DVT of lower extremity, bilateral ([place])   . Epistaxis   . Hypertension   . Incarcerated umbilical hernia 31/4970   Status post repair  . Lupus anticoagulant positive    Per records from [facility]  . 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 Pulmonary artery hypertension ([place]) 04/2011   Mildly elevated pulmonary artery pressure in setting of PE (from [facility])  . Pulmonary embolism associated with COVID-19 ([place])   . Subdural hematoma (HCC)    Remote episode, occurred when taking excedrin and warfarin.    Review of Systems:   Review of Systems  Respiratory: Positive for shortness of breath.   Cardiovascular: Negative for chest pain.  Musculoskeletal: Positive for back pain.  All other systems reviewed and are negative.  Physical Exam:  Vitals:   03/31/20 1313  BP: 132/88  Pulse: 94  Temp: 98 F (36.7 C)  TempSrc: Oral  SpO2: 96%  Weight: 214 lb 9.6 oz (97.3 kg)  Height: _0  (1.753 m)   Physical Exam  Constitutional: He is well-developed, well-nourished, and in no distress.  HENT:  Head: Normocephalic and atraumatic.  Eyes: EOM are normal. Right eye exhibits no discharge. Left eye exhibits no discharge.  Neck: No tracheal deviation present.  Cardiovascular: Normal rate and regular rhythm. Exam reveals no gallop and no friction rub.  No murmur heard. Pulmonary/Chest: Effort normal. No respiratory distress. He has no wheezes. He has no rales.  Diffuse bilateral fine  crackles  Abdominal: Soft. He exhibits no distension. There is no abdominal tenderness. There is no rebound and no guarding.  Musculoskeletal:        General: Edema (2+ edema bilaterally up to knees (L>R)) present. No tenderness or deformity. Normal range of motion.     Cervical back: Normal range of motion.  Neurological: He is alert. Coordination normal.  Skin: Skin is warm and dry. No rash noted. He is not diaphoretic. No erythema.  Psychiatric: Memory and judgment normal.     Assessment & Plan:   See Encounters Tab for problem based charting.  Patient discussed with Dr. Evette Doffing

## 2020-03-31 NOTE — Telephone Encounter (Signed)
Pt would like 90 day supply, its cheaper

## 2020-03-31 NOTE — Progress Notes (Signed)
Anticoagulation Management Jesus Russell is a 69 y.o. male who reports to the clinic for monitoring of warfarin treatment.    Indication: PE, history of; DVT, history of; Long term current use of anticoagulant.  Duration: indefinite Supervising physician: Gilles Chiquito  Anticoagulation Clinic Visit History: Patient does not report signs/symptoms of bleeding or thromboembolism  Other recent changes: No diet, medications, lifestyle changes except as noted in patient findings.  Anticoagulation Episode Summary    Current INR goal:  2.0-3.0  TTR:  62.2 % (5.1 mo)  Next INR check:  04/11/2020  INR from last check:  2.0 (03/31/2020)  Weekly max warfarin dose:    Target end date:  Indefinite  INR check location:    Preferred lab:    Send INR reminders to:     Indications   Long term (current) use of anticoagulants [Z79.01] History of DVT (deep vein thrombosis) [Z86.718] Pulmonary embolism on long-term anticoagulation therapy (HCC) [I26.99 T45.515A] History of pulmonary embolism (Resolved) [Z86.711]       Comments:          No Known Allergies  Current Outpatient Medications:  .  alendronate (FOSAMAX) 70 MG tablet, Take 1 tablet (70 mg total) by mouth every 7 (seven) days. Take with a full glass of water on an empty stomach., Disp: 12 tablet, Rfl: 3 .  amLODipine (NORVASC) 5 MG tablet, Take 1 tablet (5 mg total) by mouth daily., Disp: 30 tablet, Rfl: 11 .  BREO ELLIPTA 200-25 MCG/INH AEPB, INHALE 1 PUFF INTO THE LUNGS DAILY, Disp: 60 each, Rfl: 2 .  furosemide (LASIX) 20 MG tablet, Take 1 tablet (20 mg total) by mouth daily as needed for fluid or edema., Disp: 30 tablet, Rfl: 0 .  losartan-hydrochlorothiazide (HYZAAR) 50-12.5 MG tablet, Take 1 tablet by mouth daily., Disp: 90 tablet, Rfl: 1 .  omega-3 acid ethyl esters (LOVAZA) 1 g capsule, Take 1 capsule (1 g total) by mouth 2 (two) times daily., Disp: 30 capsule, Rfl: 3 .  OXYGEN, Inhale 2 L/min into the lungs at bedtime., Disp: ,  Rfl:  .  potassium chloride 20 MEQ TBCR, Take 20 mEq by mouth daily., Disp: 30 tablet, Rfl: 0 .  predniSONE (DELTASONE) 20 MG tablet, Take 1 tablet (20 mg total) by mouth daily with breakfast., Disp: 30 tablet, Rfl: 0 .  warfarin (COUMADIN) 4 MG tablet, TAKE 1/2 OF YOUR 4 MG WARFARIN TABLET ON SUNDAYS, TUESDAYS, THURSDAYS AND SATURDAYS. ALL OTHER DAYS TAKE 1 TABLET (Patient taking differently: Take 4 mg by mouth daily at 4 PM. Take one (1) tablet all days of week--EXCEPT on Mondays and Thursdays, take one-and-one-half (1 & 1/2) tablets on Mondays and Thursdays.), Disp: 64 tablet, Rfl: 1 .  cholecalciferol (VITAMIN D3) 25 MCG (1000 UT) tablet, Take 1,000 Units by mouth daily., Disp: , Rfl:  .  zinc gluconate 50 MG tablet, Take 50 mg by mouth daily., Disp: , Rfl:  Past Medical History:  Diagnosis Date  . Acute on chronic respiratory failure with hypoxia ([place])   . COVID-19 virus infection   . DVT of lower extremity, bilateral ([place])   . Epistaxis   . Hypertension   . Incarcerated umbilical hernia 82/9937   Status post repair  . Lupus anticoagulant positive    Per records from [facility]  . 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 Pulmonary artery hypertension ([place]) 04/2011   Mildly elevated pulmonary artery pressure  in setting of PE (from [place])  . Pulmonary embolism associated with COVID-19 ([place])   . Subdural hematoma (HCC)    Remote episode, occurred when taking excedrin and warfarin.    Social History   Socioeconomic History  . Marital status: Divorced    Spouse name: Not on file  . Number of children: Not on file  . Years of education: Not on file  . Highest education level: Not on file  Occupational History  . Occupation: retired Pharmacist, hospital  Tobacco Use  . Smoking status: Former Smoker    Packs/day: 1.50    Years: 23.00    Pack years: 34.50    Types: Cigarettes    Quit date: 1995    Years since quitting:  26.2  . Smokeless tobacco: Never Used  Substance and Sexual Activity  . Alcohol use: Never  . Drug use: Not Currently    Types: "Crack" cocaine    Comment: quit 2008  . Sexual activity: Not on file  Other Topics Concern  . Not on file  Social History Narrative  . Not on file   Social Determinants of Health   Financial Resource Strain: Low Risk   . Difficulty of Paying Living Expenses: Not very hard  Food Insecurity:   . Worried About Charity fundraiser in the Last Year:   . Arboriculturist in the Last Year:   Transportation Needs:   . Film/video editor (Medical):   Marland Kitchen Lack of Transportation (Non-Medical):   Physical Activity:   . Days of Exercise per Week:   . Minutes of Exercise per Session:   Stress:   . Feeling of Stress :   Social Connections:   . Frequency of Communication with Friends and Family:   . Frequency of Social Gatherings with Friends and Family:   . Attends Religious Services:   . Active Member of Clubs or Organizations:   . Attends Archivist Meetings:   Marland Kitchen Marital Status:    Family History  Adopted: Yes    ASSESSMENT Recent Results: The most recent result is correlated with 28 mg per week: Lab Results  Component Value Date   INR 2.0 03/31/2020   INR 2.0 (H) 03/11/2020   INR 2.0 (H) 03/10/2020    Anticoagulation Dosing: Description   Take one (1) tablet of your 67m strength blue-warfarin tablet by mouth daily at 6PM--EXCEPT on Mondays and Thursdays, take one-and-one-half (1 & 1/2) of your  417mstrength blue-warfarin tablets on these days.      INR today: Therapeutic  PLAN Weekly dose was increased by 12% to 32 mg per week  Patient Instructions  Patient instructed to take medications as defined in the Anti-coagulation Track section of this encounter.  Patient instructed to take today's dose.  Patient instructed to take one (1) tablet of your 87m35mtrength blue-warfarin tablet by mouth daily at 6PM--EXCEPT on Mondays and  Thursdays, take one-and-one-half (1 & 1/2) of your  87mg42mrength blue-warfarin tablets on these days.  Patient verbalized understanding of these instructions.    Patient advised to contact clinic or seek medical attention if signs/symptoms of bleeding or thromboembolism occur.  Patient verbalized understanding by repeating back information and was advised to contact me if further medication-related questions arise. Patient was also provided an information handout.  Follow-up Return in 11 days (on 04/11/2020) for Follow up INR.  JamePennie BanterarmD, CPP  15 minutes spent face-to-face with the patient during the encounter. 50% of  time spent on education, including signs/sx bleeding and clotting, as well as food and drug interactions with warfarin. 50% of time was spent on fingerprick POC INR sample collection,processing, results determination, and documentation in http://www.kim.[email or web address]/.

## 2020-04-01 LAB — BMP8+ANION GAP
Anion Gap: 14 mmol/L (ref 10.0–18.0)
BUN/Creatinine Ratio: 21 (ref 10–24)
BUN: 17 mg/dL (ref 8–27)
CO2: 28 mmol/L (ref 20–29)
Calcium: 9.8 mg/dL (ref 8.6–10.2)
Chloride: 99 mmol/L (ref 96–106)
Creatinine, Ser: 0.82 mg/dL (ref 0.76–1.27)
GFR calc Af Amer: 107 mL/min/{1.73_m2} (ref >59)
GFR calc non Af Amer: 92 mL/min/{1.73_m2} (ref >59)
Glucose: 88 mg/dL (ref 65–99)
Potassium: 4.5 mmol/L (ref 3.5–5.2)
Sodium: 141 mmol/L (ref 134–144)

## 2020-04-01 LAB — CBC WITH DIFFERENTIAL/PLATELET
Basophils Absolute: 0.1 10*3/uL (ref 0.0–0.2)
Basos: 1 %
EOS (ABSOLUTE): 0.2 10*3/uL (ref 0.0–0.4)
Eos: 1 %
Hematocrit: 36.3 % — ABNORMAL LOW (ref 37.5–51.0)
Hemoglobin: 11.7 g/dL — ABNORMAL LOW (ref 13.0–17.7)
Immature Grans (Abs): 0.3 10*3/uL — ABNORMAL HIGH (ref 0.0–0.1)
Immature Granulocytes: 2 %
Lymphocytes Absolute: 2.3 10*3/uL (ref 0.7–3.1)
Lymphs: 16 %
MCH: 32.2 pg (ref 26.6–33.0)
MCHC: 32.2 g/dL (ref 31.5–35.7)
MCV: 100 fL — ABNORMAL HIGH (ref 79–97)
Monocytes Absolute: 1.2 10*3/uL — ABNORMAL HIGH (ref 0.1–0.9)
Monocytes: 8 %
Neutrophils Absolute: 10.6 10*3/uL — ABNORMAL HIGH (ref 1.4–7.0)
Neutrophils: 72 %
Platelets: 331 10*3/uL (ref 150–450)
RBC: 3.63 x10E6/uL — ABNORMAL LOW (ref 4.14–5.80)
RDW: 14.3 % (ref 11.6–15.4)
WBC: 14.7 10*3/uL — ABNORMAL HIGH (ref 3.4–10.8)

## 2020-04-01 NOTE — Telephone Encounter (Signed)
I agree, thanks!

## 2020-04-01 NOTE — Progress Notes (Signed)
Internal Medicine Clinic Attending  Case discussed with Dr. Benjamine Mola  at the time of the visit.  We reviewed the resident's history and pertinent patient test results.  I agree with the assessment, diagnosis, and plan of care documented in the resident's note.

## 2020-04-02 ENCOUNTER — Other Ambulatory Visit: Payer: Self-pay | Admitting: Internal Medicine

## 2020-04-07 ENCOUNTER — Ambulatory Visit
Admission: RE | Admit: 2020-04-07 | Discharge: 2020-04-07 | Disposition: A | Discharge status: Home / Self Care | Payer: Medicare Other | Source: Ambulatory Visit | Attending: *Deleted | Admitting: *Deleted

## 2020-04-07 ENCOUNTER — Other Ambulatory Visit: Payer: Self-pay

## 2020-04-07 ENCOUNTER — Encounter: Payer: Self-pay | Admitting: *Deleted

## 2020-04-07 ENCOUNTER — Other Ambulatory Visit: Payer: Self-pay | Admitting: Interventional Radiology

## 2020-04-07 DIAGNOSIS — S22050K Wedge compression fracture of T5-T6 vertebra, subsequent encounter for fracture with nonunion: Secondary | ICD-10-CM

## 2020-04-07 HISTORY — PX: IR RADIOLOGIST EVAL & MGMT: IMG5224

## 2020-04-07 NOTE — Consult Note (Addendum)
Chief Complaint: Patient was seen in consultation today for painful thoracic compression fracture at the request of MacLean,Matthew  Referring Physician(s): MacLean,Matthew  History of Present Illness: Jesus Russell is a 69 y.o. male with a history of sarcoidosis antiphospholipid antibody syndrome, chronic prednisone use, pulmonary thromboembolic disease, pulmonary arterial hypertension, IVC filter.  12/22/2019 during treatment in [place], a CT chest was obtained which noted thoracic T6 and T8 compression fracture deformities, age indeterminant. He transferred his care to [facility].  He was diagnosed with Covid. 02/14/2020 he presented with shortness of breath and CTA demonstrated pulmonary embolism.  Incidental note also made of progression of the T8 compression fracture deformity as well as new mild compression fracture deformities of thoracic T9 and T10.  He has continued midthoracic pain.  This is primarily manifest when he tries to turn over in the bed at night.  He is not using any pain medications.  The pain is slightly increased when rising from a sitting position.  The pain is not exacerbated by walking or sitting.  He has chronic shortness of breath and feels that the pain is interfering with his desired rehabilitation.  Past Medical History:  Diagnosis Date  . Acute on chronic respiratory failure with hypoxia ([place])   . COVID-19 virus infection   . DVT of lower extremity, bilateral ([place])   . Epistaxis   . Hypertension   . Incarcerated umbilical hernia 32/2567   Status post repair  . Lupus anticoagulant positive    Per records from [facility]  . 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 Pulmonary artery hypertension ([place]) 04/2011   Mildly elevated pulmonary artery pressure in setting of PE (from [facility])  . Pulmonary embolism associated with COVID-19 ([place])   . Subdural hematoma (HCC)    Remote  episode, occurred when taking excedrin and warfarin.     Past Surgical History:  Procedure Laterality Date  . BRAIN SURGERY    . HERNIA REPAIR    . IR RADIOLOGIST EVAL & MGMT  04/07/2020  . NASAL ENDOSCOPY WITH EPISTAXIS CONTROL N/A 02/24/2020   Procedure: NASAL ENDOSCOPY WITH EPISTAXIS CONTROL;  Surgeon: [name], MD;  Location: [place];  Service: ENT;  Laterality: N/A;    Allergies: Patient has no known allergies.  Medications: Prior to Admission medications   Medication Sig Start Date End Date Taking? Authorizing Provider  alendronate (FOSAMAX) 70 MG tablet Take 1 tablet (70 mg total) by mouth every 7 (seven) days. Take with a full glass of water on an empty stomach. 03/31/20 03/31/21  Jeanmarie Hubert, MD  amLODipine (NORVASC) 5 MG tablet Take 1 tablet (5 mg total) by mouth daily. 03/31/20 03/31/21  Jeanmarie Hubert, MD  BREO ELLIPTA 200-25 MCG/INH AEPB INHALE 1 PUFF INTO THE LUNGS DAILY 03/17/20   Velna Ochs, MD  cholecalciferol (VITAMIN D3) 25 MCG (1000 UT) tablet Take 1,000 Units by mouth daily.    [provider]  furosemide (LASIX) 20 MG tablet TAKE 1 TABLET(20 MG) BY MOUTH DAILY AS NEEDED FOR FLUID RETENTION OR SWELLING 03/31/20   Jeanmarie Hubert, MD  losartan-hydrochlorothiazide (HYZAAR) 50-12.5 MG tablet Take 1 tablet by mouth daily. 12/16/19   Jeanmarie Hubert, MD  omega-3 acid ethyl esters (LOVAZA) 1 g capsule Take 1 capsule (1 g total) by mouth 2 (two) times daily. 03/31/20   Jeanmarie Hubert, MD  OXYGEN Inhale 2 L/min into the lungs at bedtime.  [provider]  Potassium Chloride ER 20 MEQ TBCR TAKE 1 TABLET BY MOUTH DAILY 04/04/20   Jeanmarie Hubert, MD  predniSONE (DELTASONE) 20 MG tablet Take 1 tablet (20 mg total) by mouth daily with breakfast. 03/31/20 04/30/20  Jeanmarie Hubert, MD  warfarin (COUMADIN) 4 MG tablet TAKE 1/2 OF YOUR 4 MG WARFARIN TABLET ON SUNDAYS, TUESDAYS, [place]. ALL OTHER DAYS TAKE 1 TABLET Patient taking differently:  Take 4 mg by mouth daily at 4 PM. Take one (1) tablet all days of week--EXCEPT on Mondays and Thursdays, take one-and-one-half (1 & 1/2) tablets on Mondays and Thursdays. 11/26/19   Pennie Banter, RPH-CPP  zinc gluconate 50 MG tablet Take 50 mg by mouth daily.    [provider]     Family History  Adopted: Yes    Social History   Socioeconomic History  . Marital status: Divorced    Spouse name: Not on file  . Number of children: Not on file  . Years of education: Not on file  . Highest education level: Not on file  Occupational History  . Occupation: retired Pharmacist, hospital  Tobacco Use  . Smoking status: Former Smoker    Packs/day: 1.50    Years: 23.00    Pack years: 34.50    Types: Cigarettes    Quit date: 1995    Years since quitting: 26.3  . Smokeless tobacco: Never Used  Substance and Sexual Activity  . Alcohol use: Never  . Drug use: Not Currently    Types: "Crack" cocaine    Comment: quit 2008  . Sexual activity: Not on file  Other Topics Concern  . Not on file  Social History Narrative  . Not on file   Social Determinants of Health   Financial Resource Strain: Low Risk   . Difficulty of Paying Living Expenses: Not very hard  Food Insecurity:   . Worried About Charity fundraiser in the Last Year:   . Arboriculturist in the Last Year:   Transportation Needs:   . Film/video editor (Medical):   Marland Kitchen Lack of Transportation (Non-Medical):   Physical Activity:   . Days of Exercise per Week:   . Minutes of Exercise per Session:   Stress:   . Feeling of Stress :   Social Connections:   . Frequency of Communication with Friends and Family:   . Frequency of Social Gatherings with Friends and Family:   . Attends Religious Services:   . Active Member of Clubs or Organizations:   . Attends Archivist Meetings:   Marland Kitchen Marital Status:     ECOG Status: 2 - Symptomatic, <50% confined to bed   Physical Exam Constitutional: Oriented to person, place,  and time. Well-developed and well-nourished.  Mildly short of breath throughout our conversation although appears comfortable. HENT:  Head: Normocephalic and atraumatic.  Eyes: Conjunctivae and EOM are normal. Right eye exhibits no discharge. Left eye exhibits no discharge. No scleral icterus.  Neck: No JVD present.  Pulmonary/Chest:  No stridor.   Abdomen: soft,  distended Neurological:  alert and oriented to person, place, and time.  Skin: Skin is warm and dry.  not diaphoretic.  Psychiatric:   normal mood and affect.   behavior is normal. Judgment and thought content normal.  Review of Systems Review of Systems: A 12 point ROS discussed and pertinent positives are indicated in the HPI above.  All other systems are negative.    Vital Signs: There  were no vitals taken for this visit. Mallampati Score:     Imaging: IR Radiologist Eval & Mgmt  Result Date: 04/07/2020 Please refer to notes tab for details about interventional procedure. (Op Note)   Labs:  CBC: Recent Labs    03/02/20 0539 03/05/20 0435 03/07/20 0412 03/31/20 1412  WBC 9.7 11.4* 10.9* 14.7*  HGB 9.2* 10.9* 11.6* 11.7*  HCT 29.1* 34.6* 36.4* 36.3*  PLT 301 364 403* 331    COAGS: Recent Labs    02/14/20 1900 02/16/20 0240 03/09/20 0512 03/10/20 0734 03/11/20 0421 03/31/20 1429  INR  --    < > 2.4* 2.0* 2.0* 2.0  APTT 32  --   --   --   --   --    < > = values in this interval not displayed.    BMP: Recent Labs    03/05/20 0435 03/05/20 0435 03/07/20 0412 03/08/20 0417 03/09/20 0512 03/31/20 1412  NA 138  --  135 138  --  141  K 4.4   < > 3.3* 3.4* 3.6 4.5  CL 95*  --  97* 98  --  99  CO2 31  --  29 27  --  28  GLUCOSE 119*  --  108* 94  --  88  BUN 12  --  19 21  --  17  CALCIUM 8.8*  --  8.9 9.1  --  9.8  CREATININE 1.02  --  1.01 1.11  --  0.82  GFRNONAA >60  --  >60 >60  --  92  GFRAA >60  --  >60 >60  --  107   < > = values in this interval not displayed.    LIVER FUNCTION  TESTS: Recent Labs    11/09/19 1440 01/24/20 1615 02/14/20 1412 03/02/20 0539  BILITOT 0.5 1.0 1.4* 0.4  AST _0 ALT _1 ALKPHOS 54 64 89 60  PROT 6.8 6.9 6.6 5.6*  ALBUMIN 3.8 3.8 3.6 2.7*    TUMOR MARKERS: No results for input(s): AFPTM, CEA, CA199, CHROMGRNA in the last 8760 hours.  Assessment and Plan:    My impression is that this patient has multiple midthoracic compression fracture deformities which likely contribute or account for most of the midthoracic pain.  Based on cross-sectional imaging, namely the 2 CTs from December and February, there are multiple involved levels, of indeterminate chronicity. No associated spinal stenosis or other contraindications.  No suggestion of metastatic disease or other pathologic findings to indicate a need for concomitant core biopsy.   I discussed with the patient  the pathophysiology of vertebral compression fracture deformities; the stable nature of these which does not require emergent treatment; natural history which includes healing over some unpredictable number of months.  We discussed treatment options including watchful waiting, surgical fixation, and percutaneous kyphoplasty/vertebroplasty.  We discussed in detail the percutaneous kyphoplasty technique, anticipated benefits, time course to symptom resolution, possible risks and side effects.  We discussed his elevated risk of additional level fractures with or without vertebral augmentation.  We discussed the long-term need for continued bone building therapy managed by the patient's PCP.   He seemed to understand, and did ask appropriate questions.   I would recommend thoracic spine MR to update the evaluation of his known compression deformities, evaluate for interval healing, determine which if any remain unhealed and may benefit from intervention.  We will go ahead and set this up as an outpatient. Subsequently, we  can review the findings of the MR and determine  if his symptoms persist at a level motivating him to proceed with treatment ASAP.    Thank you for this interesting consult.  I greatly enjoyed meeting LOGIN MUCKLEROY and look forward to participating in their care.  A copy of this report was sent to the requesting provider on this date.  Electronically Signed: Rickard Rhymes 04/07/2020, 2:33 PM   I spent a total of  30 Minutes   in face to face in clinical consultation, greater than 50% of which was counseling/coordinating care for thoracic compression fractures.

## 2020-04-07 NOTE — Progress Notes (Signed)
I reviewed Dr. Gladstone Pih note.  Patient is on coumadin for DVT/PE and INR was on the low end of goal.  Coumadin increased.

## 2020-04-11 ENCOUNTER — Ambulatory Visit (INDEPENDENT_AMBULATORY_CARE_PROVIDER_SITE_OTHER): Payer: Medicare Other | Admitting: Pharmacist

## 2020-04-11 DIAGNOSIS — I2699 Other pulmonary embolism without acute cor pulmonale: Secondary | ICD-10-CM

## 2020-04-11 DIAGNOSIS — Z86711 Personal history of pulmonary embolism: Secondary | ICD-10-CM

## 2020-04-11 DIAGNOSIS — Z5181 Encounter for therapeutic drug level monitoring: Secondary | ICD-10-CM

## 2020-04-11 DIAGNOSIS — Z86718 Personal history of other venous thrombosis and embolism: Secondary | ICD-10-CM

## 2020-04-11 DIAGNOSIS — Z7901 Long term (current) use of anticoagulants: Secondary | ICD-10-CM | POA: Diagnosis present

## 2020-04-11 LAB — POCT INR: INR: 3.1 — AB (ref 2.0–3.0)

## 2020-04-11 NOTE — Progress Notes (Signed)
Anticoagulation Management Jesus Russell is a 69 y.o. male who reports to the clinic for monitoring of warfarin treatment.    Indication: DVT, History of (resolved); PE, History of (resolved); Long term current use of anticoagulant.  Duration: indefinite Supervising physician: Aldine Contes  Anticoagulation Clinic Visit History: Patient does not report signs/symptoms of bleeding or thromboembolism  Other recent changes: No diet, medications, lifestyle changes.  Anticoagulation Episode Summary    Current INR goal:  2.0-3.0  TTR:  64.1 % (5.5 mo)  Next INR check:  05/02/2020  INR from last check:  3.1 (04/11/2020)  Weekly max warfarin dose:    Target end date:  Indefinite  INR check location:    Preferred lab:    Send INR reminders to:     Indications   Long term (current) use of anticoagulants [Z79.01] History of DVT (deep vein thrombosis) [Z86.718] Pulmonary embolism on long-term anticoagulation therapy (HCC) [I26.99 T45.515A] History of pulmonary embolism (Resolved) [Z86.711]       Comments:          No Known Allergies  Current Outpatient Medications:  .  alendronate (FOSAMAX) 70 MG tablet, Take 1 tablet (70 mg total) by mouth every 7 (seven) days. Take with a full glass of water on an empty stomach., Disp: 12 tablet, Rfl: 3 .  amLODipine (NORVASC) 5 MG tablet, Take 1 tablet (5 mg total) by mouth daily., Disp: 30 tablet, Rfl: 11 .  BREO ELLIPTA 200-25 MCG/INH AEPB, INHALE 1 PUFF INTO THE LUNGS DAILY, Disp: 60 each, Rfl: 2 .  cholecalciferol (VITAMIN D3) 25 MCG (1000 UT) tablet, Take 1,000 Units by mouth daily., Disp: , Rfl:  .  furosemide (LASIX) 20 MG tablet, TAKE 1 TABLET(20 MG) BY MOUTH DAILY AS NEEDED FOR FLUID RETENTION OR SWELLING, Disp: 90 tablet, Rfl: 0 .  losartan-hydrochlorothiazide (HYZAAR) 50-12.5 MG tablet, Take 1 tablet by mouth daily., Disp: 90 tablet, Rfl: 1 .  omega-3 acid ethyl esters (LOVAZA) 1 g capsule, Take 1 capsule (1 g total) by mouth 2 (two)  times daily., Disp: 30 capsule, Rfl: 3 .  Potassium Chloride ER 20 MEQ TBCR, TAKE 1 TABLET BY MOUTH DAILY, Disp: 90 tablet, Rfl: 0 .  predniSONE (DELTASONE) 20 MG tablet, Take 1 tablet (20 mg total) by mouth daily with breakfast., Disp: 30 tablet, Rfl: 0 .  warfarin (COUMADIN) 4 MG tablet, TAKE 1/2 OF YOUR 4 MG WARFARIN TABLET ON SUNDAYS, TUESDAYS, THURSDAYS AND SATURDAYS. ALL OTHER DAYS TAKE 1 TABLET (Patient taking differently: Take 4 mg by mouth daily at 4 PM. Take one (1) tablet all days of week--EXCEPT on Mondays and Thursdays, take one-and-one-half (1 & 1/2) tablets on Mondays and Thursdays.), Disp: 64 tablet, Rfl: 1 .  zinc gluconate 50 MG tablet, Take 50 mg by mouth daily., Disp: , Rfl:  .  OXYGEN, Inhale 2 L/min into the lungs at bedtime., Disp: , Rfl:  Past Medical History:  Diagnosis Date  . Acute on chronic respiratory failure with hypoxia ([place])   . COVID-19 virus infection   . DVT of lower extremity, bilateral ([place])   . Epistaxis   . Hypertension   . Incarcerated umbilical hernia 62/2297   Status post repair  . Lupus anticoagulant positive    Per records from [facility]  . 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 Pulmonary artery hypertension ([place]) 04/2011   Mildly elevated pulmonary artery pressure in setting of PE (  from [place])  . Pulmonary embolism associated with COVID-19 ([place])   . Subdural hematoma (HCC)    Remote episode, occurred when taking excedrin and warfarin.    Social History   Socioeconomic History  . Marital status: Divorced    Spouse name: Not on file  . Number of children: Not on file  . Years of education: Not on file  . Highest education level: Not on file  Occupational History  . Occupation: retired Pharmacist, hospital  Tobacco Use  . Smoking status: Former Smoker    Packs/day: 1.50    Years: 23.00    Pack years: 34.50    Types: Cigarettes    Quit date: 1995    Years since quitting:  26.3  . Smokeless tobacco: Never Used  Substance and Sexual Activity  . Alcohol use: Never  . Drug use: Not Currently    Types: "Crack" cocaine    Comment: quit 2008  . Sexual activity: Not on file  Other Topics Concern  . Not on file  Social History Narrative  . Not on file   Social Determinants of Health   Financial Resource Strain: Low Risk   . Difficulty of Paying Living Expenses: Not very hard  Food Insecurity:   . Worried About Charity fundraiser in the Last Year:   . Arboriculturist in the Last Year:   Transportation Needs:   . Film/video editor (Medical):   Marland Kitchen Lack of Transportation (Non-Medical):   Physical Activity:   . Days of Exercise per Week:   . Minutes of Exercise per Session:   Stress:   . Feeling of Stress :   Social Connections:   . Frequency of Communication with Friends and Family:   . Frequency of Social Gatherings with Friends and Family:   . Attends Religious Services:   . Active Member of Clubs or Organizations:   . Attends Archivist Meetings:   Marland Kitchen Marital Status:    Family History  Adopted: Yes    ASSESSMENT Recent Results: The most recent result is correlated with 32  mg per week: Lab Results  Component Value Date   INR 3.1 (A) 04/11/2020   INR 2.0 03/31/2020   INR 2.0 (H) 03/11/2020    Anticoagulation Dosing: Description   Take one (1) tablet of your 57m strength blue-warfarin tablet by mouth daily at 6PM--[facility] one-and-one-half (1 & 1/2) of your  449mstrength blue-warfarin tablets on WEDNESDAYS.      INR today: Therapeutic  PLAN Weekly dose was decreased by 6% to 30 mg per week  Patient Instructions  Patient instructed to take medications as defined in the Anti-coagulation Track section of this encounter.  Patient instructed to take today's dose.  Patient instructed to take one (1) tablet of your 91m1mtrength blue-warfarin tablet by mouth daily at 6PM--[facility]  one-and-one-half (1 & 1/2) of your  91mg69mrength blue-warfarin tablets on WEDNESDAYS.  Patient verbalized understanding of these instructions.    Patient advised to contact clinic or seek medical attention if signs/symptoms of bleeding or thromboembolism occur.  Patient verbalized understanding by repeating back information and was advised to contact me if further medication-related questions arise. Patient was also provided an information handout.  Follow-up Return in 3 weeks (on 05/02/2020) for Follow up INR.  JamePennie BanterarmD, CPP  15 minutes spent face-to-face with the patient during the encounter. 50% of time spent on education, including signs/sx bleeding and clotting,  as well as food and drug interactions with warfarin. 50% of time was spent on fingerprick POC INR sample collection,processing, results determination, and documentation in [email or web address]

## 2020-04-11 NOTE — Patient Instructions (Signed)
Patient instructed to take medications as defined in the Anti-coagulation Track section of this encounter.  Patient instructed to take today's dose.  Patient instructed to take one (1) tablet of your 18m strength blue-warfarin tablet by mouth daily at 6PM--EXCEPT [facility] take one-and-one-half (1 & 1/2) of your  419mstrength blue-warfarin tablets on WEDNESDAYS.  Patient verbalized understanding of these instructions.

## 2020-04-12 ENCOUNTER — Encounter: Payer: Self-pay | Admitting: *Deleted

## 2020-04-12 NOTE — Progress Notes (Signed)
INTERNAL MEDICINE TEACHING ATTENDING ADDENDUM - Aldine Contes M.D  Duration- indefinite, Indication- DVT, PE, INR- supratherapeutic. Agree with pharmacy recommendations as outlined in their note.

## 2020-04-19 ENCOUNTER — Ambulatory Visit (HOSPITAL_COMMUNITY)
Admission: RE | Admit: 2020-04-19 | Discharge: 2020-04-19 | Disposition: A | Discharge status: Home / Self Care | Payer: Medicare Other | Source: Ambulatory Visit | Attending: Interventional Radiology | Admitting: Interventional Radiology

## 2020-04-19 ENCOUNTER — Other Ambulatory Visit: Payer: Self-pay

## 2020-04-19 DIAGNOSIS — S22050K Wedge compression fracture of T5-T6 vertebra, subsequent encounter for fracture with nonunion: Secondary | ICD-10-CM | POA: Diagnosis present

## 2020-04-25 ENCOUNTER — Other Ambulatory Visit: Payer: Self-pay | Admitting: Internal Medicine

## 2020-04-25 ENCOUNTER — Other Ambulatory Visit (HOSPITAL_COMMUNITY): Payer: Self-pay | Admitting: Interventional Radiology

## 2020-04-25 DIAGNOSIS — S22070A Wedge compression fracture of T9-T10 vertebra, initial encounter for closed fracture: Secondary | ICD-10-CM

## 2020-04-25 DIAGNOSIS — S22060A Wedge compression fracture of T7-T8 vertebra, initial encounter for closed fracture: Secondary | ICD-10-CM

## 2020-04-25 MED ORDER — WARFARIN SODIUM 4 MG PO TABS: ORAL_TABLET | ORAL | 1 refills | Status: DC

## 2020-04-25 NOTE — Telephone Encounter (Signed)
Patient requests refill on warfarin to [facility] at [facility].

## 2020-04-27 ENCOUNTER — Telehealth (HOSPITAL_COMMUNITY): Payer: Self-pay

## 2020-04-27 ENCOUNTER — Other Ambulatory Visit: Payer: Self-pay | Admitting: *Deleted

## 2020-04-27 MED ORDER — FUROSEMIDE 20 MG PO TABS: ORAL_TABLET | ORAL | 0 refills | Status: DC

## 2020-04-27 MED ORDER — HYDROCHLOROTHIAZIDE 25 MG PO TABS: 12.5000 mg | ORAL_TABLET | Freq: Every day | ORAL | 1 refills | Status: DC

## 2020-04-27 MED ORDER — PREDNISONE 20 MG PO TABS: 20.0000 mg | ORAL_TABLET | Freq: Every day | ORAL | 0 refills | Status: DC

## 2020-04-27 MED ORDER — FAMOTIDINE 20 MG PO TABS: 20.0000 mg | ORAL_TABLET | Freq: Two times a day (BID) | ORAL | 1 refills | Status: DC

## 2020-04-27 NOTE — Telephone Encounter (Signed)
-----  Message from Pennie Banter, [place] sent at 04/27/2020  9:58 AM EDT ----- Regarding: RE: Warfarin Hold YES. He may hold warfarin for four days prior to kyphoplasty. No bridging with LMWH will be necessary. Off of anticoagulation 4 days prior to procedure. Thank you.  ----- Message ----- From: Zola Button Sent: 04/26/2020  11:49 AM EDT To: Pennie Banter, RPH-CPP, Jeanmarie Hubert, MD Subject: RE: Warfarin Hold                              Please see urgent sent message from Radiology.  Thanks for your help in advance!  Ch[place] ----- Message ----- From: Danielle Dess Sent: 04/26/2020  11:39 AM EDT To: Zola Button Subject: FW: Warfarin Hold                              Chilon,   Below is my request to hold Warfarin. Thank you for your help.  ----- Message ----- From: Danielle Dess Sent: 04/25/2020   3:56 PM EDT To: Pennie Banter, RPH-CPP Subject: Warfarin Hold                                  Good afternoon,   I have Jesus Russell scheduled for a kyphoplasty here at [facility] on 05/04/20. He has to hold him Warfarin 4 days prior to this procedure. I asked him who manages his Warfarin so that I could get approval and he gave me your name. Is it ok for Mr. Kerrigan to hold this medication 4 days prior to his kyphoplasty? Please let me know if I need to reach out to someone else. Thanks,  Gildardo Pounds IR Scheduler

## 2020-04-27 NOTE — Telephone Encounter (Signed)
Call from pt requesting refill on Lasix which he received 30 tabs not 90 tabs on 4/8 and Prednisone which was refilled on 4/8 but was not sent electronically, No Print. Pt stated he's completely out.  I called [facility], pharmacist stated need another rx sent for Lasix since they did not received the rx on 4/8.  Pt called and informed

## 2020-05-02 ENCOUNTER — Ambulatory Visit (INDEPENDENT_AMBULATORY_CARE_PROVIDER_SITE_OTHER): Payer: Medicare Other | Admitting: Pharmacist

## 2020-05-02 DIAGNOSIS — I2699 Other pulmonary embolism without acute cor pulmonale: Secondary | ICD-10-CM

## 2020-05-02 DIAGNOSIS — Z86718 Personal history of other venous thrombosis and embolism: Secondary | ICD-10-CM | POA: Diagnosis not present

## 2020-05-02 DIAGNOSIS — Z7901 Long term (current) use of anticoagulants: Secondary | ICD-10-CM

## 2020-05-02 LAB — POCT INR: INR: 1.6 — AB (ref 2.0–3.0)

## 2020-05-02 MED ORDER — ENOXAPARIN SODIUM 150 MG/ML ~~LOC~~ SOLN
150.0000 mg | SUBCUTANEOUS | 0 refills | Status: DC
Start: 2020-05-02 — End: 2020-05-25

## 2020-05-02 NOTE — Progress Notes (Signed)
Anticoagulation Management Jesus Russell is a 69 y.o. male who reports to the clinic for monitoring of warfarin treatment.    Indication: PE, History of (resolved); DVT (History of, resolved); Long term current use of anticoagulant warfarin.   Duration: indefinite Supervising physician: Aldine Contes  Anticoagulation Clinic Visit History: Patient does not report signs/symptoms of bleeding or thromboembolism  Other recent changes: No diet, medications, lifestyle EXCEPT as NOTED IN PATIENT FINDINGS.  Anticoagulation Episode Summary    Current INR goal:  2.0-3.0  TTR:  64.4 % (6.2 mo)  Next INR check:  05/09/2020  INR from last check:  1.6 (05/02/2020)  Weekly max warfarin dose:    Target end date:  Indefinite  INR check location:    Preferred lab:    Send INR reminders to:     Indications   Long term (current) use of anticoagulants [Z79.01] History of DVT (deep vein thrombosis) [Z86.718] Pulmonary embolism on long-term anticoagulation therapy (HCC) [I26.99 T45.515A] History of pulmonary embolism (Resolved) [Z86.711]       Comments:          No Known Allergies  Current Outpatient Medications:  .  alendronate (FOSAMAX) 70 MG tablet, Take 1 tablet (70 mg total) by mouth every 7 (seven) days. Take with a full glass of water on an empty stomach., Disp: 12 tablet, Rfl: 3 .  amLODipine (NORVASC) 5 MG tablet, Take 1 tablet (5 mg total) by mouth daily., Disp: 30 tablet, Rfl: 11 .  Ascorbic Acid (VITAMIN C) 500 MG CAPS, Take 500 mg by mouth daily., Disp: , Rfl:  .  BREO ELLIPTA 200-25 MCG/INH AEPB, INHALE 1 PUFF INTO THE LUNGS DAILY (Patient taking differently: Inhale 1 puff into the lungs daily. ), Disp: 60 each, Rfl: 2 .  famotidine (PEPCID) 20 MG tablet, Take 1 tablet (20 mg total) by mouth 2 (two) times daily., Disp: 180 tablet, Rfl: 1 .  furosemide (LASIX) 20 MG tablet, TAKE 1 TABLET(20 MG) BY MOUTH DAILY AS NEEDED FOR FLUID RETENTION OR SWELLING, Disp: 90 tablet, Rfl: 0 .   hydrochlorothiazide (HYDRODIURIL) 25 MG tablet, Take 0.5 tablets (12.5 mg total) by mouth daily., Disp: 45 tablet, Rfl: 1 .  losartan (COZAAR) 25 MG tablet, Take 25 mg by mouth in the morning and at bedtime., Disp: , Rfl:  .  omega-3 acid ethyl esters (LOVAZA) 1 g capsule, Take 1 capsule (1 g total) by mouth 2 (two) times daily. (Patient taking differently: Take 1 g by mouth daily. ), Disp: 30 capsule, Rfl: 3 .  predniSONE (DELTASONE) 20 MG tablet, Take 1 tablet (20 mg total) by mouth daily with breakfast., Disp: 30 tablet, Rfl: 0 .  cholecalciferol (VITAMIN D3) 25 MCG (1000 UT) tablet, Take 1,000 Units by mouth daily., Disp: , Rfl:  .  enoxaparin (LOVENOX) 150 MG/ML injection, Inject 1 mL (150 mg total) into the skin daily. Inject at level of waist-band, two inches away from your "belly-button." DO NOT ADMINISTER DAY OF PROCEDURE., Disp: 10 mL, Rfl: 0 .  Potassium Chloride ER 20 MEQ TBCR, TAKE 1 TABLET BY MOUTH DAILY (Patient not taking: Reported on 05/02/2020), Disp: 90 tablet, Rfl: 0 .  warfarin (COUMADIN) 4 MG tablet, Take one-and-one-half (1 & 1/2)  tablets on Mondays and Thursdays. All other day, take one (1) tablet (Patient not taking: Reported on 05/02/2020), Disp: 96 tablet, Rfl: 1 .  zinc gluconate 50 MG tablet, Take 50 mg by mouth daily., Disp: , Rfl:  Past Medical History:  Diagnosis Date  .  Acute on chronic respiratory failure with hypoxia ([place])   . COVID-19 virus infection   . DVT of lower extremity, bilateral ([place])   . Epistaxis   . Hypertension   . Incarcerated umbilical hernia 96/2836   Status post repair  . Lupus anticoagulant positive    Per records from [facility]  . 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 Pulmonary artery hypertension ([place]) 04/2011   Mildly elevated pulmonary artery pressure in setting of PE (from [facility])  . Pulmonary embolism associated with COVID-19 ([place])   . Subdural hematoma (HCC)      Remote episode, occurred when taking excedrin and warfarin.    Social History   Socioeconomic History  . Marital status: Divorced    Spouse name: Not on file  . Number of children: Not on file  . Years of education: Not on file  . Highest education level: Not on file  Occupational History  . Occupation: retired Pharmacist, hospital  Tobacco Use  . Smoking status: Former Smoker    Packs/day: 1.50    Years: 23.00    Pack years: 34.50    Types: Cigarettes    Quit date: 1995    Years since quitting: 26.3  . Smokeless tobacco: Never Used  Substance and Sexual Activity  . Alcohol use: Never  . Drug use: Not Currently    Types: "Crack" cocaine    Comment: quit 2008  . Sexual activity: Not on file  Other Topics Concern  . Not on file  Social History Narrative  . Not on file   Social Determinants of Health   Financial Resource Strain: Low Risk   . Difficulty of Paying Living Expenses: Not very hard  Food Insecurity:   . Worried About Charity fundraiser in the Last Year:   . Arboriculturist in the Last Year:   Transportation Needs:   . Film/video editor (Medical):   Marland Kitchen Lack of Transportation (Non-Medical):   Physical Activity:   . Days of Exercise per Week:   . Minutes of Exercise per Session:   Stress:   . Feeling of Stress :   Social Connections:   . Frequency of Communication with Friends and Family:   . Frequency of Social Gatherings with Friends and Family:   . Attends Religious Services:   . Active Member of Clubs or Organizations:   . Attends Archivist Meetings:   Marland Kitchen Marital Status:    Family History  Adopted: Yes    ASSESSMENT Recent Results: The most recent result is correlated with 30 mg per week--BUT WITH WARFARIN HAVING BEEN HELD Saturday May 8; Sunday, May 9, Monday, May 10, Tuesday, May 11 and Wednesday, May 04, 2020 in [place] on 12-MAY-21.  Lab Results  Component Value Date   INR 1.6 (A) 05/02/2020   INR 3.1 (A) 04/11/2020    INR 2.0 03/31/2020    Anticoagulation Dosing: Description   Take one (1) tablet of your 74m strength blue-warfarin tablet by mouth daily [place].       INR today: Subtherapeutic  PLAN Weekly dose was deferred at this time.  Patient Instructions  Bridging Instructions  1. Patient stopped his warfarin on Friday May, 7th 2021 as per our instructions to do so. 2. Patient will commence low-molecular-weight-heparin (enoxaparin/Lovenox) at 1.597mper kilogram (patient weighs 100 kilograms; Dose is 15016mer dose).  3. Start tonight at 6:00 PM, Monday May 02, 2020 with your 110m Lovenox syringe--administer at level of the waistband, and two inches away from your "belly-button." 4. Second dose will be at 7:30 am on Tuesday May 03, 2020. THIS IS YOUR LAST DOSE BEFORE YOUR PROCEDURE TOMORROW.  5. DO NOT INJECT ANY enoxaparin/Lovenox on day/date of procedure (Kyphoplasty) on Wednesday, May 04, 2020. 6. On Thursday, May 13th, 2021--Recommence warfarin at your normal scheduled warfarin dose base upon instructions provided (one of your 451mstrength blue warfarin tablets, one tablet, [facility]. On Thursday, May 13th, 2021--Recommence enoxaparin/Lovenox 15034myringe, administered subcutaneously at level of the waistband--two inches away from your "belly-button," every 24 hours (at 7:30AM). CONTINUE ONCE-DAILY LOVENOX INJECTIONS, DAILY AT 7:30 AM.) 8. [facility] Monday, May 17th , 2021.    Patient advised to contact clinic or seek medical attention if signs/symptoms of bleeding or thromboembolism occur.  Patient verbalized understanding by repeating back information and was advised to contact me if further medication-related questions arise. Patient was also provided an information handout.  Follow-up Return in 1 week (on 05/09/2020) for Follow up INR.  JamDorene Grebeoce  15  minutes spent face-to-face with the patient during the encounter. 50% of time spent on education, including signs/sx bleeding and clotting, as well as food and drug interactions with warfarin. 50% of time was spent on fingerprick POC INR sample collection,processing, results determination, and documentation in [email or web address]

## 2020-05-02 NOTE — Patient Instructions (Signed)
Bridging Instructions  1. Patient stopped his warfarin on Friday May, 7th 2021 as per our instructions to do so. 2. Patient will commence low-molecular-weight-heparin (enoxaparin/Lovenox) at 1.72m per kilogram (patient weighs 100 kilograms; Dose is 1581mper dose).  3. Start tonight at 6:00 PM, Monday May 02, 2020 with your 15064movenox syringe--administer at level of the waistband, and two inches away from your "belly-button." 4. Second dose will be at 7:30 am on Tuesday May 03, 2020. THIS IS YOUR LAST DOSE BEFORE YOUR PROCEDURE TOMORROW.  5. DO NOT INJECT ANY enoxaparin/Lovenox on day/date of procedure (Kyphoplasty) on Wednesday, May 04, 2020. 6. On Thursday, May 13th, 2021--Recommence warfarin at your normal scheduled warfarin dose base upon instructions provided (one of your 4mg14mrength blue warfarin tablets, one tablet, by mouth, once-daily at 6PM.[facility]. On Thursday, May 13th, 2021--Recommence enoxaparin/Lovenox 150mg80minge, administered subcutaneously at level of the waistband--two inches away from your "belly-button," every 24 hours (at 7:30AM). CONTINUE ONCE-DAILY LOVENOX INJECTIONS, DAILY AT 7:30 AM.) 8. [facility] , [facility].

## 2020-05-03 ENCOUNTER — Other Ambulatory Visit: Payer: Self-pay | Admitting: Radiology

## 2020-05-03 NOTE — Progress Notes (Signed)
INTERNAL MEDICINE TEACHING ATTENDING ADDENDUM - Jesus Russell M.D  Duration- indefinite, Indication- recurrent PE, DVT, INR- sub therapeutic. Agree with pharmacy recommendations as outlined in their note.    Patient's coumadin is on hold for his kyphoplasty and he will start lovenox for bridging till his procedure

## 2020-05-04 ENCOUNTER — Other Ambulatory Visit: Payer: Self-pay

## 2020-05-04 ENCOUNTER — Encounter (HOSPITAL_COMMUNITY): Payer: Self-pay

## 2020-05-04 ENCOUNTER — Other Ambulatory Visit (HOSPITAL_COMMUNITY): Payer: Self-pay | Admitting: Interventional Radiology

## 2020-05-04 ENCOUNTER — Ambulatory Visit (HOSPITAL_COMMUNITY)
Admission: RE | Admit: 2020-05-04 | Discharge: 2020-05-04 | Disposition: A | Discharge status: Home / Self Care | Payer: Medicare Other | Source: Ambulatory Visit | Attending: Interventional Radiology | Admitting: Interventional Radiology

## 2020-05-04 DIAGNOSIS — Z79899 Other long term (current) drug therapy: Secondary | ICD-10-CM | POA: Insufficient documentation

## 2020-05-04 DIAGNOSIS — Z8616 Personal history of COVID-19: Secondary | ICD-10-CM | POA: Insufficient documentation

## 2020-05-04 DIAGNOSIS — Z7983 Long term (current) use of bisphosphonates: Secondary | ICD-10-CM | POA: Insufficient documentation

## 2020-05-04 DIAGNOSIS — I10 Essential (primary) hypertension: Secondary | ICD-10-CM | POA: Diagnosis not present

## 2020-05-04 DIAGNOSIS — Z86711 Personal history of pulmonary embolism: Secondary | ICD-10-CM | POA: Insufficient documentation

## 2020-05-04 DIAGNOSIS — X58XXXA Exposure to other specified factors, initial encounter: Secondary | ICD-10-CM | POA: Insufficient documentation

## 2020-05-04 DIAGNOSIS — Z86718 Personal history of other venous thrombosis and embolism: Secondary | ICD-10-CM | POA: Insufficient documentation

## 2020-05-04 DIAGNOSIS — I2721 Secondary pulmonary arterial hypertension: Secondary | ICD-10-CM | POA: Diagnosis not present

## 2020-05-04 DIAGNOSIS — S22070A Wedge compression fracture of T9-T10 vertebra, initial encounter for closed fracture: Secondary | ICD-10-CM | POA: Diagnosis not present

## 2020-05-04 DIAGNOSIS — S22060A Wedge compression fracture of T7-T8 vertebra, initial encounter for closed fracture: Secondary | ICD-10-CM | POA: Insufficient documentation

## 2020-05-04 DIAGNOSIS — Z87891 Personal history of nicotine dependence: Secondary | ICD-10-CM | POA: Diagnosis not present

## 2020-05-04 DIAGNOSIS — D6861 Antiphospholipid syndrome: Secondary | ICD-10-CM | POA: Insufficient documentation

## 2020-05-04 DIAGNOSIS — Z7901 Long term (current) use of anticoagulants: Secondary | ICD-10-CM | POA: Insufficient documentation

## 2020-05-04 HISTORY — PX: IR KYPHO THORACIC WITH BONE BIOPSY: IMG5518

## 2020-05-04 HISTORY — PX: IR KYPHO EA ADDL LEVEL THORACIC OR LUMBAR: IMG5520

## 2020-05-04 LAB — BASIC METABOLIC PANEL
Anion gap: 12 (ref 5–15)
BUN: 21 mg/dL (ref 8–23)
CO2: 27 mmol/L (ref 22–32)
Calcium: 8.9 mg/dL (ref 8.9–10.3)
Chloride: 102 mmol/L (ref 98–111)
Creatinine, Ser: 0.96 mg/dL (ref 0.61–1.24)
GFR calc Af Amer: >60 mL/min (ref >60)
GFR calc non Af Amer: >60 mL/min (ref >60)
Glucose, Bld: 88 mg/dL (ref 70–99)
Potassium: 3.9 mmol/L (ref 3.5–5.1)
Sodium: 141 mmol/L (ref 135–145)

## 2020-05-04 LAB — CBC
HCT: 37 % — ABNORMAL LOW (ref 39.0–52.0)
Hemoglobin: 11.1 g/dL — ABNORMAL LOW (ref 13.0–17.0)
MCH: 29.4 pg (ref 26.0–34.0)
MCHC: 30 g/dL (ref 30.0–36.0)
MCV: 97.9 fL (ref 80.0–100.0)
Platelets: 406 10*3/uL — ABNORMAL HIGH (ref 150–400)
RBC: 3.78 MIL/uL — ABNORMAL LOW (ref 4.22–5.81)
RDW: 15.6 % — ABNORMAL HIGH (ref 11.5–15.5)
WBC: 13.4 10*3/uL — ABNORMAL HIGH (ref 4.0–10.5)
nRBC: 0.1 % (ref 0.0–0.2)

## 2020-05-04 LAB — PROTIME-INR
INR: 1.2 (ref 0.8–1.2)
Prothrombin Time: 14.2 seconds (ref 11.4–15.2)

## 2020-05-04 MED ORDER — CEFAZOLIN SODIUM-DEXTROSE 2-4 GM/100ML-% IV SOLN
INTRAVENOUS | Status: AC
  Filled 2020-05-04: qty 100

## 2020-05-04 MED ORDER — MIDAZOLAM HCL 2 MG/2ML IJ SOLN
INTRAMUSCULAR | Status: AC | PRN
  Administered 2020-05-04: 0.5 mg via INTRAVENOUS
  Administered 2020-05-04: 1 mg via INTRAVENOUS
  Administered 2020-05-04 (×2): 0.5 mg via INTRAVENOUS

## 2020-05-04 MED ORDER — CEFAZOLIN SODIUM-DEXTROSE 2-4 GM/100ML-% IV SOLN
INTRAVENOUS | Status: AC | PRN
  Administered 2020-05-04: 2 g via INTRAVENOUS

## 2020-05-04 MED ORDER — MIDAZOLAM HCL 2 MG/2ML IJ SOLN
INTRAMUSCULAR | Status: AC
  Filled 2020-05-04: qty 2

## 2020-05-04 MED ORDER — FENTANYL CITRATE (PF) 100 MCG/2ML IJ SOLN
INTRAMUSCULAR | Status: AC
  Filled 2020-05-04: qty 2

## 2020-05-04 MED ORDER — BUPIVACAINE HCL (PF) 0.25 % IJ SOLN
INTRAMUSCULAR | Status: AC
  Administered 2020-05-04: 30 mL
  Filled 2020-05-04: qty 30

## 2020-05-04 MED ORDER — FENTANYL CITRATE (PF) 100 MCG/2ML IJ SOLN
INTRAMUSCULAR | Status: AC | PRN
  Administered 2020-05-04 (×6): 25 ug via INTRAVENOUS

## 2020-05-04 MED ORDER — CEFAZOLIN SODIUM-DEXTROSE 2-4 GM/100ML-% IV SOLN: 2.0000 g | INTRAVENOUS | Status: DC

## 2020-05-04 MED ORDER — SODIUM CHLORIDE 0.9 % IV SOLN: INTRAVENOUS | Status: DC

## 2020-05-04 MED ORDER — BUPIVACAINE HCL (PF) 0.25 % IJ SOLN
INTRAMUSCULAR | Status: AC
  Filled 2020-05-04: qty 30

## 2020-05-04 MED ORDER — IOHEXOL 300 MG/ML  SOLN
50.0000 mL | Freq: Once | INTRAMUSCULAR | Status: AC | PRN
  Administered 2020-05-04: 10 mL

## 2020-05-04 MED ORDER — IOHEXOL 300 MG/ML  SOLN
50.0000 mL | Freq: Once | INTRAMUSCULAR | Status: AC | PRN
  Administered 2020-05-04: 20 mL

## 2020-05-04 NOTE — Progress Notes (Signed)
Discharge instructions reviewed with pt and his sister(via telephone) both voice understanding.

## 2020-05-04 NOTE — H&P (Signed)
Chief Complaint: Patient was seen in consultation today for Thoracic 08/01/09 Kyphoplasty at the request  Of Dr Jarome Matin   Supervising Physician: Arne Cleveland  Patient Status: [facility] - Out-pt  History of Present Illness: Jesus Russell is a 69 y.o. male    History of sarcoidosis antiphospholipid antibody syndrome, chronic prednisone use, pulmonary thromboembolic disease, pulmonary arterial hypertension, IVC filter. Covid + 12/2019 Covid - 03/10/20  Pt was referred to Dr Vernard Gambles for evaluation of back pain and possible treatment Was sen in consultation 04/06/20  Known fractures of T6 and T8 since 12/20-- although age indeterminate; noted while still living in [place] Was seen in ED with SOB 01/2020 and found PE-- taking coumadin now daily LD 04/29/20 LD Lovenox 05/03/20  Incidental finding of progression of T8 fracture and acute fxs T9 and T10. He is complaining still of low to mid back pain. Taking no meds-- but pain is persistent and worsening Unable to sleep well  MRI 04/19/20: IMPRESSION: 1. Subacute compression fractures of the T8, T9 and T10 vertebral bodies with less than 25% height loss and no associated stenosis. 2. No spinal canal or neural foraminal stenosis. 3. No acute abnormality.  Now scheduled for Kyphoplasty of all levels with Dr Vernard Gambles  Past Medical History:  Diagnosis Date  . Acute on chronic respiratory failure with hypoxia ([place])   . COVID-19 virus infection   . DVT of lower extremity, bilateral ([place])   . Epistaxis   . Hypertension   . Incarcerated umbilical hernia [id number]/7989   Status post repair  . Lupus anticoagulant positive    Per records from [facility]  . 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 Pulmonary artery hypertension ([place]) 04/2011   Mildly elevated pulmonary artery pressure in setting of PE (from [facility])  . Pulmonary embolism associated with COVID-19 ([place])   .  Subdural hematoma ([facility])    Remote episode, occurred when taking excedrin and warfarin.     Past Surgical History:  Procedure Laterality Date  . BRAIN SURGERY    . HERNIA REPAIR    . IR RADIOLOGIST EVAL & MGMT  04/07/2020  . NASAL ENDOSCOPY WITH EPISTAXIS CONTROL N/A 02/24/2020   Procedure: NASAL ENDOSCOPY WITH EPISTAXIS CONTROL;  Surgeon: [name], MD;  Location: [place];  Service: ENT;  Laterality: N/A;    Allergies: Patient has no known allergies.  Medications: Prior to Admission medications   Medication Sig Start Date End Date Taking? Authorizing Provider  alendronate (FOSAMAX) 70 MG tablet Take 1 tablet (70 mg total) by mouth every 7 (seven) days. Take with a full glass of water on an empty stomach. 03/31/20 03/31/21 Yes Jeanmarie Hubert, MD  amLODipine (NORVASC) 5 MG tablet Take 1 tablet (5 mg total) by mouth daily. 03/31/20 03/31/21 Yes Jeanmarie Hubert, MD  Ascorbic Acid (VITAMIN C) 500 MG CAPS Take 500 mg by mouth daily.   Yes [provider]  BREO ELLIPTA 200-25 MCG/INH AEPB INHALE 1 PUFF INTO THE LUNGS DAILY Patient taking differently: Inhale 1 puff into the lungs daily.  03/17/20  Yes Velna Ochs, MD  cholecalciferol (VITAMIN D3) 25 MCG (1000 UT) tablet Take 1,000 Units by mouth daily.   Yes [provider]  enoxaparin (LOVENOX) 150 MG/ML injection Inject 1 mL (150 mg total) into the skin daily. Inject at level of waist-band, two inches away from your "belly-button." DO NOT ADMINISTER DAY OF PROCEDURE. 05/02/20  Yes Pennie Banter, RPH-CPP  famotidine (PEPCID) 20 MG tablet Take 1 tablet (20 mg total) by mouth 2 (two) times daily. 04/27/20  Yes Aldine Contes, MD  furosemide (LASIX) 20 MG tablet TAKE 1 TABLET(20 MG) BY MOUTH DAILY AS NEEDED FOR FLUID RETENTION OR SWELLING 04/27/20  Yes Aldine Contes, MD  hydrochlorothiazide (HYDRODIURIL) 25 MG tablet Take 0.5 tablets (12.5 mg total) by mouth daily. 04/27/20  Yes Aldine Contes, MD  losartan (COZAAR) 25 MG  tablet Take 25 mg by mouth in the morning and at bedtime.   Yes [provider]  omega-3 acid ethyl esters (LOVAZA) 1 g capsule Take 1 capsule (1 g total) by mouth 2 (two) times daily. Patient taking differently: Take 1 g by mouth daily.  03/31/20  Yes Jeanmarie Hubert, MD  Potassium Chloride ER 20 MEQ TBCR TAKE 1 TABLET BY MOUTH DAILY 04/04/20  Yes Jeanmarie Hubert, MD  predniSONE (DELTASONE) 20 MG tablet Take 1 tablet (20 mg total) by mouth daily with breakfast. 04/27/20 05/27/20 Yes Aldine Contes, MD  zinc gluconate 50 MG tablet Take 50 mg by mouth daily.   Yes [provider]  warfarin (COUMADIN) 4 MG tablet Take one-and-one-half (1 & 1/2)  tablets on Mondays and Thursdays. All other day, take one (1) tablet 04/25/20   Pennie Banter, RPH-CPP     Family History  Adopted: Yes    Social History   Socioeconomic History  . Marital status: Divorced    Spouse name: Not on file  . Number of children: Not on file  . Years of education: Not on file  . Highest education level: Not on file  Occupational History  . Occupation: retired Pharmacist, hospital  Tobacco Use  . Smoking status: Former Smoker    Packs/day: 1.50    Years: 23.00    Pack years: 34.50    Types: Cigarettes    Quit date: 1995    Years since quitting: 26.3  . Smokeless tobacco: Never Used  Substance and Sexual Activity  . Alcohol use: Never  . Drug use: Not Currently    Types: "Crack" cocaine    Comment: quit 2008  . Sexual activity: Not on file  Other Topics Concern  . Not on file  Social History Narrative  . Not on file   Social Determinants of Health   Financial Resource Strain: Low Risk   . Difficulty of Paying Living Expenses: Not very hard  Food Insecurity:   . Worried About Charity fundraiser in the Last Year:   . Arboriculturist in the Last Year:   Transportation Needs:   . Film/video editor (Medical):   Marland Kitchen Lack of Transportation (Non-Medical):   Physical Activity:   . Days of Exercise per  Week:   . Minutes of Exercise per Session:   Stress:   . Feeling of Stress :   Social Connections:   . Frequency of Communication with Friends and Family:   . Frequency of Social Gatherings with Friends and Family:   . Attends Religious Services:   . Active Member of Clubs or Organizations:   . Attends Archivist Meetings:   Marland Kitchen Marital Status:      Review of Systems: A 12 point ROS discussed and pertinent positives are indicated in the HPI above.  All other systems are negative.  Review of Systems  Constitutional: Positive for activity change. Negative for fatigue.  Respiratory: Positive for shortness of breath and wheezing.   Cardiovascular: Negative  for chest pain.  Musculoskeletal: Positive for back pain and gait problem.  Neurological: Negative for weakness.  Psychiatric/Behavioral: Negative for behavioral problems, confusion and decreased concentration.    Vital Signs: BP (!) 141/93   Pulse 92   Temp 98 F (36.7 C) (Skin)   Resp 18   Ht 5' 9" (1.753 m)   Wt 215 lb (97.5 kg)   SpO2 98%   BMI 31.75 kg/m   Physical Exam Vitals reviewed.  Cardiovascular:     Rate and Rhythm: Normal rate and regular rhythm.     Heart sounds: Normal heart sounds.  Pulmonary:     Effort: No respiratory distress.     Breath sounds: Wheezing present.  Abdominal:     Palpations: Abdomen is soft.  Musculoskeletal:        General: Normal range of motion.  Skin:    General: Skin is warm and dry.  Neurological:     Mental Status: He is alert and oriented to person, place, and time.  Psychiatric:        Behavior: Behavior normal.        Thought Content: Thought content normal.        Judgment: Judgment normal.     Imaging: MR THORACIC SPINE WO CONTRAST  Result Date: 04/19/2020 CLINICAL DATA:  Thoracic compression fractures. EXAM: MRI THORACIC SPINE WITHOUT CONTRAST TECHNIQUE: Multiplanar, multisequence MR imaging of the thoracic spine was performed. No intravenous  contrast was administered. COMPARISON:  CTA chest 02/14/2020 FINDINGS: Alignment:  Normal Vertebrae: There are compression deformities of the T8, T9 and T10 vertebral bodies. There is moderate edema at T9 with approximately 10% height loss. At T8 and T10, there is approximately 25% height loss but minimal edema. The degree of height loss at all 3 levels is unchanged compared to 02/14/2020. Cord:  Normal signal and morphology. Paraspinal and other soft tissues: Negative. Disc levels: There is no spinal canal or neural foraminal stenosis. There is minimal narrowing of the ventral thecal sac at the T8-9 and T9-10 levels secondary to posttraumatic deformities of the vertebral endplates, but the does not cause stenosis. IMPRESSION: 1. Subacute compression fractures of the T8, T9 and T10 vertebral bodies with less than 25% height loss and no associated stenosis. 2. No spinal canal or neural foraminal stenosis. 3. No acute abnormality. Electronically Signed   By: Ulyses Jarred M.D.   On: 04/19/2020 19:45   IR Radiologist Eval & Mgmt  Result Date: 04/07/2020 Please refer to notes tab for details about interventional procedure. (Op Note)   Labs:  CBC: Recent Labs    03/05/20 0435 03/07/20 0412 03/31/20 1412 05/04/20 0652  WBC 11.4* 10.9* 14.7* 13.4*  HGB 10.9* 11.6* 11.7* 11.1*  HCT 34.6* 36.4* 36.3* 37.0*  PLT 364 403* 331 406*    COAGS: Recent Labs    02/14/20 1900 02/16/20 0240 03/11/20 0421 03/31/20 1429 04/11/20 1424 05/02/20 1452  INR  --    < > 2.0* 2.0 3.1* 1.6*  APTT 32  --   --   --   --   --    < > = values in this interval not displayed.    BMP: Recent Labs    03/05/20 0435 03/05/20 0435 03/07/20 0412 03/08/20 0417 03/09/20 0512 03/31/20 1412  NA 138  --  135 138  --  141  K 4.4   < > 3.3* 3.4* 3.6 4.5  CL 95*  --  97* 98  --  99  CO2 31  --  29 27  --  28  GLUCOSE 119*  --  108* 94  --  88  BUN 12  --  19 21  --  17  CALCIUM 8.8*  --  8.9 9.1  --  9.8    CREATININE 1.02  --  1.01 1.11  --  0.82  GFRNONAA >60  --  >60 >60  --  92  GFRAA >60  --  >60 >60  --  107   < > = values in this interval not displayed.    LIVER FUNCTION TESTS: Recent Labs    11/09/19 1440 01/24/20 1615 02/14/20 1412 03/02/20 0539  BILITOT 0.5 1.0 1.4* 0.4  AST _0 ALT _1 ALKPHOS 54 64 89 60  PROT 6.8 6.9 6.6 5.6*  ALBUMIN 3.8 3.8 3.6 2.7*    TUMOR MARKERS: No results for input(s): AFPTM, CEA, CA199, CHROMGRNA in the last 8760 hours.  Assessment and Plan:  Worsening back pain Progression of Thoracic 8 fracture; acute Fractures Thoracic 9 and 10 Scheduled for T8/9/10 Kyphoplaasty Risks and benefits of T8/9/10 kyphoplasty were discussed with the patient including, but not limited to education regarding the natural healing process of compression fractures without intervention, bleeding, infection, cement migration which may cause spinal cord damage, paralysis, pulmonary embolism or even death.  This interventional procedure involves the use of X-rays and because of the nature of the planned procedure, it is possible that we will have prolonged use of X-ray fluoroscopy.  Potential radiation risks to you include (but are not limited to) the following: - A slightly elevated risk for cancer  several years later in life. This risk is typically less than 0.5% percent. This risk is low in comparison to the normal incidence of human cancer, which is 33% for women and 50% for men according to the [place]. - Radiation induced injury can include skin redness, resembling a rash, tissue breakdown / ulcers and hair loss (which can be temporary or permanent).   The likelihood of either of these occurring depends on the difficulty of the procedure and whether you are sensitive to radiation due to previous procedures, disease, or genetic conditions.   IF your procedure requires a prolonged use of radiation, you will be notified and given  written instructions for further action.  It is your responsibility to monitor the irradiated area for the 2 weeks following the procedure and to notify your physician if you are concerned that you have suffered a radiation induced injury.    All of the patient's questions were answered, patient is agreeable to proceed. Consent signed and in chart.  Thank you for this interesting consult.  I greatly enjoyed meeting Jesus Russell and look forward to participating in their care.  A copy of this report was sent to the requesting provider on this date.  Electronically Signed: Lavonia Drafts, PA-C 05/04/2020, 8:31 AM   I spent a total of  30 Minutes   in face to face in clinical consultation, greater than 50% of which was counseling/coordinating care for T8/9/10 KP

## 2020-05-04 NOTE — Discharge Instructions (Addendum)
Percutaneous Vertebroplasty, Care After This sheet gives you information about how to care for yourself after your procedure. Your doctor may also give you more specific instructions. If you have problems or questions, contact your doctor. Follow these instructions at home: Surgical cut (incision) care  Follow instructions from your doctor about how to take care of your cut from surgery. Make sure you: ? Wash your hands with soap and water before you change your bandage (dressing). If you cannot use soap and water, use hand sanitizer. ? Change your bandage as told by your doctor. ? Leave stitches (sutures), skin glue, or skin tape (adhesive) strips in place. They may need to stay in place for 2 weeks or longer. If tape strips get loose and curl up, you may trim the loose edges. Do not remove tape strips completely unless your doctor says it is okay.  Check your surgical cut area every day for signs of infection. Check for: ? Redness, swelling, or pain. ? Fluid or blood. ? Warmth. ? Pus or a bad smell.  Keep the bandage dry as told by your doctor. Do not shower or bathe until your doctor says it is okay. Managing pain, stiffness, and swelling   If directed, apply ice to the affected area: ? Put ice in a plastic bag. ? Place a towel between your skin and bag. ? Leave the ice on for 20 minutes, 2-3 times a day.  Rest for 24 hrs after the procedure or as told by your doctor. Activity  Slowly return to normal activities as told by your doctor.  Ask what type of stretching and strengthening exercises you should do.  Do not bend or lift anything greater than 10 lb (4.5 kg). Follow your doctor's instructions about bending and lifting. General instructions  Take over-the-counter and prescription medicines only as told by your doctor.  Do not drive for 24 hours if you were given a medicine to help you relax (sedative).  To prevent or treat constipation while you are taking prescription  pain medicine, your doctor may recommend that you: ? Drink enough fluid to keep your pee (urine) clear or pale yellow. ? Take over-the-counter or prescription medicines. ? Eat foods that are high in fiber, such as:  Fresh fruits.  Fresh vegetables.  Whole grains.  Beans. ? Limit foods that are high in fat and processed sugars, such as fried and sweet foods. ? Keep all follow-up visits as told by your doctor. This is important. Contact a doctor if:  You have redness, swelling, or pain around your cut.  You have fluid or blood coming from your cut.  Your cut feels warm to the touch.  You have pus or bad smell coming from your cut.  You have a fever.  You are sick to your stomach (nauseous) or throw up (vomit) for more than 24 hours.  Your back pain does not get better. Get help right away if:  You have very bad back pain that comes on all of a sudden.  You cannot control when you pee or poop (bowel movement).  You lose feeling (become numb) or have tingling in your legs or feet, or they become weak.  You have new tingling, numbness, or weakness in your legs or feet.  You have sudden weakness in your legs.  You have pain that shoots down your legs.  You have chest pain.  You have trouble breathing.  You are short of breath.  You feel dizzy or you pass  out (faint).  Your vision changes or you cannot talk as you normally do. Summary  Rest for 24 hrs after the procedure and return slowly to normal activities as told by your doctor.  Do not drive for 24 hours if you were given a medicine to help you relax (sedative).  Take over-the-counter and prescription medicines only as told by your doctor. This information is not intended to replace advice given to you by your health care provider. Make sure you discuss any questions you have with your health care provider. Document Revised: 11/22/2017 Document Reviewed: 03/12/2017 Elsevier Patient Education  [place]. Balloon Kyphoplasty, Care After This sheet gives you information about how to care for yourself after your procedure. Your health care provider may also give you more specific instructions. If you have problems or questions, contact your health care provider. What can I expect after the procedure? After your procedure, it is common to have back pain. Follow these instructions at home: Medicines  Take over-the-counter and prescription medicines only as told by your health care provider.  Ask your health care provider if the medicine prescribed to you: ? Requires you to avoid driving or using heavy machinery. ? Can cause constipation. You may need to take steps to prevent or treat constipation, such as:  Drink enough fluid to keep your urine pale yellow.  Take over-the-counter or prescription medicines.  Eat foods that are high in fiber, such as beans, whole grains, and fresh fruits and vegetables.  Limit foods that are high in fat and processed sugars, such as fried or sweet foods. Puncture site care   Follow instructions from your health care provider about how to take care of your puncture site. Make sure you: ? Wash your hands with soap and water before and after you change your bandage (dressing). If soap and water are not available, use hand sanitizer. ? Change your dressing as told by your health care provider. ? Leave skin glue or adhesive strips in place. These skin closures may need to be in place for 2 weeks or longer. If adhesive strip edges start to loosen and curl up, you may trim the loose edges. Do not remove adhesive strips completely unless your health care provider tells you to do that.  Check your puncture site every day for signs of infection. Watch for: ? Redness, swelling, or pain. ? Fluid or blood. ? Warmth. ? Pus or a bad smell.  Keep your dressing dry until your health care provider says that it can be removed. Managing pain, stiffness, and  swelling   If directed, put ice on the painful area. ? Put ice in a plastic bag. ? Place a towel between your skin and the bag. ? Leave the ice on for 20 minutes, 2-3 times a day. Activity  Rest your back and avoid intense physical activity for as long as told by your health care provider.  Avoid bending, lifting, or twisting your back for as long as told by your health care provider.  Return to your normal activities as told by your health care provider. Ask your health care provider what activities are safe for you.  Do not lift anything that is heavier than 5 lb (2.2 kg). You may need to avoid heavy lifting for several weeks. General instructions  Do not use any products that contain nicotine or tobacco, such as cigarettes, e-cigarettes, and chewing tobacco. These can delay bone healing. If you need help quitting, ask your health care  provider.  Do not drive for 24 hours if you were given a sedative during your procedure.  Keep all follow-up visits as told by your health care provider. This is important. Contact a health care provider if:  You have a fever or chills.  You have redness, swelling, or pain at the site of your puncture.  You have fluid, blood, or pus coming from the puncture site.  You have pain that gets worse or does not get better with medicine.  You develop numbness or weakness in any part of your body. Get help right away if:  You have chest pain.  You have difficulty breathing.  You have weakness, numbness, or tingling in your legs.  You cannot control your bladder or bowel movements.  You suddenly become weak or numb on one side of your body.  You become very confused.  You have trouble speaking or understanding, or both. Summary  Follow instructions from your health care provider about how to take care of your puncture site.  Take over-the-counter and prescription medicines only as told by your health care provider.  Rest your back and  avoid intense physical activity for as long as told by your health care provider.  Contact a health care provider if you have pain that gets worse or does not get better with medicine.  Keep all follow-up visits as told by your health care provider. This is important. This information is not intended to replace advice given to you by your health care provider. Make sure you discuss any questions you have with your health care provider. Document Revised: 11/17/2018 Document Reviewed: 11/17/2018 Elsevier Patient Education  [place]. Moderate Conscious Sedation, Adult Sedation is the use of medicines to promote relaxation and relieve discomfort and anxiety. Moderate conscious sedation is a type of sedation. Under moderate conscious sedation, you are less alert than normal, but you are still able to respond to instructions, touch, or both. Moderate conscious sedation is used during short medical and dental procedures. It is milder than deep sedation, which is a type of sedation under which you cannot be easily woken up. It is also milder than general anesthesia, which is the use of medicines to make you unconscious. Moderate conscious sedation allows you to return to your regular activities sooner. Tell a health care provider about:  Any allergies you have.  All medicines you are taking, including vitamins, herbs, eye drops, creams, and over-the-counter medicines.  Use of steroids (by mouth or creams).  Any problems you or family members have had with sedatives and anesthetic medicines.  Any blood disorders you have.  Any surgeries you have had.  Any medical conditions you have, such as sleep apnea.  Whether you are pregnant or may be pregnant.  Any use of cigarettes, alcohol, marijuana, or street drugs. What are the risks? Generally, this is a safe procedure. However, problems may occur, including:  Getting too much medicine (oversedation).  Nausea.  Allergic reaction to  medicines.  Trouble breathing. If this happens, a breathing tube may be used to help with breathing. It will be removed when you are awake and breathing on your own.  Heart trouble.  Lung trouble. What happens before the procedure? Staying hydrated Follow instructions from your health care provider about hydration, which may include:  Up to 2 hours before the procedure - you may continue to drink clear liquids, such as water, clear fruit juice, black coffee, and plain tea. Eating and drinking restrictions Follow instructions from  your health care provider about eating and drinking, which may include:  8 hours before the procedure - stop eating heavy meals or foods such as meat, fried foods, or fatty foods.  6 hours before the procedure - stop eating light meals or foods, such as toast or cereal.  6 hours before the procedure - stop drinking milk or drinks that contain milk.  2 hours before the procedure - stop drinking clear liquids. Medicine Ask your health care provider about:  Changing or stopping your regular medicines. This is especially important if you are taking diabetes medicines or blood thinners.  Taking medicines such as aspirin and ibuprofen. These medicines can thin your blood. Do not take these medicines before your procedure if your health care provider instructs you not to.  Tests and exams  You will have a physical exam.  You may have blood tests done to show: ? How well your kidneys and liver are working. ? How well your blood can clot. General instructions  Plan to have someone take you home from the hospital or clinic.  If you will be going home right after the procedure, plan to have someone with you for 24 hours. What happens during the procedure?  An IV tube will be inserted into one of your veins.  Medicine to help you relax (sedative) will be given through the IV tube.  The medical or dental procedure will be performed. What happens after the  procedure?  Your blood pressure, heart rate, breathing rate, and blood oxygen level will be monitored often until the medicines you were given have worn off.  Do not drive for 24 hours. This information is not intended to replace advice given to you by your health care provider. Make sure you discuss any questions you have with your health care provider. Document Revised: 11/22/2017 Document Reviewed: 03/31/2016 Elsevier Patient Education  2020 [employer].

## 2020-05-04 NOTE — Procedures (Signed)
  Procedure: Kyphoplasty thoracic T8, T9, T10   EBL:   minimal Complications:  none immediate  See full dictation in [employer].  Dillard Cannon MD Main # [phone] Pager  [phone]

## 2020-05-05 ENCOUNTER — Other Ambulatory Visit: Payer: Self-pay | Admitting: Interventional Radiology

## 2020-05-05 DIAGNOSIS — S22000A Wedge compression fracture of unspecified thoracic vertebra, initial encounter for closed fracture: Secondary | ICD-10-CM

## 2020-05-09 ENCOUNTER — Ambulatory Visit (INDEPENDENT_AMBULATORY_CARE_PROVIDER_SITE_OTHER): Payer: Medicare Other | Admitting: Pharmacist

## 2020-05-09 DIAGNOSIS — Z7901 Long term (current) use of anticoagulants: Secondary | ICD-10-CM

## 2020-05-09 DIAGNOSIS — Z86718 Personal history of other venous thrombosis and embolism: Secondary | ICD-10-CM

## 2020-05-09 DIAGNOSIS — I2699 Other pulmonary embolism without acute cor pulmonale: Secondary | ICD-10-CM | POA: Diagnosis not present

## 2020-05-09 LAB — POCT INR: INR: 2.1 (ref 2.0–3.0)

## 2020-05-09 NOTE — Progress Notes (Signed)
Anticoagulation Management Jesus Russell is a 69 y.o. male who reports to the clinic for monitoring of warfarin treatment.    Indication: History of DVT; History of PE; Current long term use of anticoagulant.   Duration: indefinite Supervising physician: [facility] Visit History: Patient does not report signs/symptoms of bleeding or thromboembolism  Other recent changes: No diet, medications, lifestyle changes except as noted in patient findings.  Anticoagulation Episode Summary    Current INR goal:  2.0-3.0  TTR:  62.4 % (6.4 mo)  Next INR check:  05/30/2020  INR from last check:  2.1 (05/09/2020)  Weekly max warfarin dose:    Target end date:  Indefinite  INR check location:    Preferred lab:    Send INR reminders to:     Indications   Long term (current) use of anticoagulants [Z79.01] History of DVT (deep vein thrombosis) [Z86.718] Pulmonary embolism on long-term anticoagulation therapy (HCC) [I26.99 T45.515A] History of pulmonary embolism (Resolved) [Z86.711]       Comments:          No Known Allergies  Current Outpatient Medications:  .  alendronate (FOSAMAX) 70 MG tablet, Take 1 tablet (70 mg total) by mouth every 7 (seven) days. Take with a full glass of water on an empty stomach., Disp: 12 tablet, Rfl: 3 .  amLODipine (NORVASC) 5 MG tablet, Take 1 tablet (5 mg total) by mouth daily., Disp: 30 tablet, Rfl: 11 .  Ascorbic Acid (VITAMIN C) 500 MG CAPS, Take 500 mg by mouth daily., Disp: , Rfl:  .  BREO ELLIPTA 200-25 MCG/INH AEPB, INHALE 1 PUFF INTO THE LUNGS DAILY (Patient taking differently: Inhale 1 puff into the lungs daily. ), Disp: 60 each, Rfl: 2 .  cholecalciferol (VITAMIN D3) 25 MCG (1000 UT) tablet, Take 1,000 Units by mouth daily., Disp: , Rfl:  .  famotidine (PEPCID) 20 MG tablet, Take 1 tablet (20 mg total) by mouth 2 (two) times daily., Disp: 180 tablet, Rfl: 1 .  furosemide (LASIX) 20 MG tablet, TAKE 1 TABLET(20 MG) BY MOUTH  DAILY AS NEEDED FOR FLUID RETENTION OR SWELLING, Disp: 90 tablet, Rfl: 0 .  hydrochlorothiazide (HYDRODIURIL) 25 MG tablet, Take 0.5 tablets (12.5 mg total) by mouth daily., Disp: 45 tablet, Rfl: 1 .  losartan (COZAAR) 25 MG tablet, Take 25 mg by mouth in the morning and at bedtime., Disp: , Rfl:  .  omega-3 acid ethyl esters (LOVAZA) 1 g capsule, Take 1 capsule (1 g total) by mouth 2 (two) times daily. (Patient taking differently: Take 1 g by mouth daily. ), Disp: 30 capsule, Rfl: 3 .  Potassium Chloride ER 20 MEQ TBCR, TAKE 1 TABLET BY MOUTH DAILY, Disp: 90 tablet, Rfl: 0 .  predniSONE (DELTASONE) 20 MG tablet, Take 1 tablet (20 mg total) by mouth daily with breakfast., Disp: 30 tablet, Rfl: 0 .  warfarin (COUMADIN) 4 MG tablet, Take one-and-one-half (1 & 1/2)  tablets on Mondays and Thursdays. All other day, take one (1) tablet, Disp: 96 tablet, Rfl: 1 .  zinc gluconate 50 MG tablet, Take 50 mg by mouth daily., Disp: , Rfl:  .  enoxaparin (LOVENOX) 150 MG/ML injection, Inject 1 mL (150 mg total) into the skin daily. Inject at level of waist-band, two inches away from your "belly-button." DO NOT ADMINISTER DAY OF PROCEDURE. (Patient not taking: Reported on 05/09/2020), Disp: 10 mL, Rfl: 0 Past Medical History:  Diagnosis Date  . Acute on chronic respiratory failure with hypoxia (  [place])   . COVID-19 virus infection   . DVT of lower extremity, bilateral ([place])   . Epistaxis   . Hypertension   . Incarcerated umbilical hernia 77/4128   Status post repair  . Lupus anticoagulant positive    Per records from [facility]  . 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 Pulmonary artery hypertension ([place]) 04/2011   Mildly elevated pulmonary artery pressure in setting of PE (from [facility])  . Pulmonary embolism associated with COVID-19 ([place])   . Subdural hematoma (HCC)    Remote episode, occurred when taking excedrin and warfarin.     Social History   Socioeconomic History  . Marital status: Divorced    Spouse name: Not on file  . Number of children: Not on file  . Years of education: Not on file  . Highest education level: Not on file  Occupational History  . Occupation: retired Pharmacist, hospital  Tobacco Use  . Smoking status: Former Smoker    Packs/day: 1.50    Years: 23.00    Pack years: 34.50    Types: Cigarettes    Quit date: 1995    Years since quitting: 26.3  . Smokeless tobacco: Never Used  Substance and Sexual Activity  . Alcohol use: Never  . Drug use: Not Currently    Types: "Crack" cocaine    Comment: quit 2008  . Sexual activity: Not on file  Other Topics Concern  . Not on file  Social History Narrative  . Not on file   Social Determinants of Health   Financial Resource Strain: Low Risk   . Difficulty of Paying Living Expenses: Not very hard  Food Insecurity:   . Worried About Charity fundraiser in the Last Year:   . Arboriculturist in the Last Year:   Transportation Needs:   . Film/video editor (Medical):   Marland Kitchen Lack of Transportation (Non-Medical):   Physical Activity:   . Days of Exercise per Week:   . Minutes of Exercise per Session:   Stress:   . Feeling of Stress :   Social Connections:   . Frequency of Communication with Friends and Family:   . Frequency of Social Gatherings with Friends and Family:   . Attends Religious Services:   . Active Member of Clubs or Organizations:   . Attends Archivist Meetings:   Marland Kitchen Marital Status:    Family History  Adopted: Yes    ASSESSMENT Recent Results: The most recent result is correlated with warfarin having been held as previously documented; recommenced warfarin on Thursday 13-MAY-21 with concomitant LMWH bridge. LMWH being discontinued today as INR is therapeutic.  Lab Results  Component Value Date   INR 2.1 05/09/2020   INR 1.2 05/04/2020   INR 1.6 (A) 05/02/2020    Anticoagulation Dosing: Description   Take  one-and-one-half (1&1/2) of your 32m strength-blue war[facility]in tablets [facility]. All other days, take only one (1) of your 431mstrength-blue warfarin tablets at 6PM.     INR today: Therapeutic  PLAN Weekly dose was increased by 7% to 32 mg per week  Patient Instructions  Patient instructed to take medications as defined in the Anti-coagulation Track section of this encounter.  Patient instructed to take today's dose.  Patient instructed to take one-and-one-half (1&1/2) of your 82m12mtrength-blue war[facility]in tablets [facility]. All other days,  take only one (1) of your 5m strength-blue warfarin tablets at [facility] Patient verbalized understanding of these instructions.    Patient advised to contact clinic or seek medical attention if signs/symptoms of bleeding or thromboembolism occur.  Patient verbalized understanding by repeating back information and was advised to contact me if further medication-related questions arise. Patient was also provided an information handout.  Follow-up Return in 3 weeks (on 05/30/2020) for Follow up INR.  JPennie Banter PharmD, CPP  15 minutes spent face-to-face with the patient during the encounter. 50% of time spent on education, including signs/sx bleeding and clotting, as well as food and drug interactions with warfarin. 50% of time was spent on fingerprick POC INR sample collection,processing, results determination, and documentation in [email or web address]

## 2020-05-09 NOTE — Patient Instructions (Signed)
Patient instructed to take medications as defined in the Anti-coagulation Track section of this encounter.  Patient instructed to take today's dose.  Patient instructed to take one-and-one-half (1&1/2) of your 45m strength-blue war[facility]in tablets [facility] All other days, take only one (1) of your 415mstrength-blue warfarin tablets [facility] of these instructions.

## 2020-05-10 NOTE — Telephone Encounter (Signed)
Spoke with patient and requested he make an appointment with pulmonology to discuss prednisone taper. Patient articulated understanding.

## 2020-05-10 NOTE — Telephone Encounter (Signed)
Thanks!

## 2020-05-18 ENCOUNTER — Telehealth: Payer: Self-pay

## 2020-05-18 NOTE — Telephone Encounter (Signed)
Rtc, got vmail, lm for rtc

## 2020-05-18 NOTE — Telephone Encounter (Signed)
Received TC from patient, he states his pulmonologist, Dr. Shearon Stalls, had "put him on an antibiotic 3x/week because I was high risk for infections".  Pt asking if he needs to continue this.  RN asked patient if he contacted Dr. Mauricio Po office and he states he has not.  RN advised patient to call Dr. Mauricio Po office for recommendation since Dr. Shearon Stalls prescribed the medication, he verbalized understanding. SChaplin, RN,BSN

## 2020-05-18 NOTE — Telephone Encounter (Signed)
Pt returning call, pls call back [phone]

## 2020-05-18 NOTE — Telephone Encounter (Signed)
Requesting to speak with a nurse about meds, please call pt back.  

## 2020-05-20 ENCOUNTER — Inpatient Hospital Stay (HOSPITAL_COMMUNITY): Payer: Medicare Other

## 2020-05-20 ENCOUNTER — Other Ambulatory Visit: Payer: Self-pay

## 2020-05-20 ENCOUNTER — Inpatient Hospital Stay (HOSPITAL_COMMUNITY)
Admission: EM | Admit: 2020-05-20 | Discharge: 2020-05-25 | DRG: 189 | Disposition: A | Discharge status: Home / Self Care | Payer: Medicare Other | Attending: Internal Medicine | Admitting: Internal Medicine

## 2020-05-20 ENCOUNTER — Observation Stay (HOSPITAL_COMMUNITY): Payer: Medicare Other

## 2020-05-20 ENCOUNTER — Emergency Department (HOSPITAL_COMMUNITY): Payer: Medicare Other

## 2020-05-20 ENCOUNTER — Encounter (HOSPITAL_COMMUNITY): Payer: Self-pay | Admitting: Emergency Medicine

## 2020-05-20 DIAGNOSIS — J9621 Acute and chronic respiratory failure with hypoxia: Principal | ICD-10-CM | POA: Diagnosis present

## 2020-05-20 DIAGNOSIS — Z79899 Other long term (current) drug therapy: Secondary | ICD-10-CM | POA: Diagnosis not present

## 2020-05-20 DIAGNOSIS — M810 Age-related osteoporosis without current pathological fracture: Secondary | ICD-10-CM | POA: Diagnosis present

## 2020-05-20 DIAGNOSIS — J9622 Acute and chronic respiratory failure with hypercapnia: Secondary | ICD-10-CM | POA: Diagnosis present

## 2020-05-20 DIAGNOSIS — Z7901 Long term (current) use of anticoagulants: Secondary | ICD-10-CM | POA: Diagnosis not present

## 2020-05-20 DIAGNOSIS — Z8616 Personal history of COVID-19: Secondary | ICD-10-CM

## 2020-05-20 DIAGNOSIS — J9601 Acute respiratory failure with hypoxia: Secondary | ICD-10-CM | POA: Diagnosis not present

## 2020-05-20 DIAGNOSIS — I10 Essential (primary) hypertension: Secondary | ICD-10-CM | POA: Diagnosis present

## 2020-05-20 DIAGNOSIS — D869 Sarcoidosis, unspecified: Secondary | ICD-10-CM | POA: Diagnosis present

## 2020-05-20 DIAGNOSIS — I110 Hypertensive heart disease with heart failure: Secondary | ICD-10-CM | POA: Diagnosis present

## 2020-05-20 DIAGNOSIS — M40209 Unspecified kyphosis, site unspecified: Secondary | ICD-10-CM | POA: Diagnosis present

## 2020-05-20 DIAGNOSIS — Z87891 Personal history of nicotine dependence: Secondary | ICD-10-CM

## 2020-05-20 DIAGNOSIS — I2609 Other pulmonary embolism with acute cor pulmonale: Secondary | ICD-10-CM | POA: Diagnosis not present

## 2020-05-20 DIAGNOSIS — I50810 Right heart failure, unspecified: Secondary | ICD-10-CM | POA: Diagnosis present

## 2020-05-20 DIAGNOSIS — I50813 Acute on chronic right heart failure: Secondary | ICD-10-CM | POA: Diagnosis not present

## 2020-05-20 DIAGNOSIS — I2781 Cor pulmonale (chronic): Secondary | ICD-10-CM | POA: Diagnosis present

## 2020-05-20 DIAGNOSIS — J9691 Respiratory failure, unspecified with hypoxia: Secondary | ICD-10-CM | POA: Diagnosis present

## 2020-05-20 DIAGNOSIS — J449 Chronic obstructive pulmonary disease, unspecified: Secondary | ICD-10-CM | POA: Diagnosis present

## 2020-05-20 DIAGNOSIS — G931 Anoxic brain damage, not elsewhere classified: Secondary | ICD-10-CM | POA: Diagnosis present

## 2020-05-20 DIAGNOSIS — Z7401 Bed confinement status: Secondary | ICD-10-CM | POA: Diagnosis not present

## 2020-05-20 DIAGNOSIS — I2699 Other pulmonary embolism without acute cor pulmonale: Secondary | ICD-10-CM | POA: Diagnosis present

## 2020-05-20 DIAGNOSIS — J9602 Acute respiratory failure with hypercapnia: Secondary | ICD-10-CM | POA: Diagnosis not present

## 2020-05-20 DIAGNOSIS — Z86718 Personal history of other venous thrombosis and embolism: Secondary | ICD-10-CM | POA: Diagnosis not present

## 2020-05-20 DIAGNOSIS — I50812 Chronic right heart failure: Secondary | ICD-10-CM | POA: Diagnosis not present

## 2020-05-20 DIAGNOSIS — R140 Abdominal distension (gaseous): Secondary | ICD-10-CM

## 2020-05-20 DIAGNOSIS — I2510 Atherosclerotic heart disease of native coronary artery without angina pectoris: Secondary | ICD-10-CM | POA: Diagnosis present

## 2020-05-20 DIAGNOSIS — R0600 Dyspnea, unspecified: Secondary | ICD-10-CM

## 2020-05-20 LAB — POCT I-STAT 7, (LYTES, BLD GAS, ICA,H+H)
Acid-Base Excess: 5 mmol/L — ABNORMAL HIGH (ref 0.0–2.0)
Acid-Base Excess: 4 mmol/L — ABNORMAL HIGH (ref 0.0–2.0)
Bicarbonate: 32 mmol/L — ABNORMAL HIGH (ref 20.0–28.0)
Bicarbonate: 33 mmol/L — ABNORMAL HIGH (ref 20.0–28.0)
Calcium, Ion: 1.25 mmol/L (ref 1.15–1.40)
Calcium, Ion: 1.27 mmol/L (ref 1.15–1.40)
HCT: 37 % — ABNORMAL LOW (ref 39.0–52.0)
HCT: 36 % — ABNORMAL LOW (ref 39.0–52.0)
Hemoglobin: 12.6 g/dL — ABNORMAL LOW (ref 13.0–17.0)
Hemoglobin: 12.2 g/dL — ABNORMAL LOW (ref 13.0–17.0)
O2 Saturation: 96 %
O2 Saturation: 98 %
Potassium: 3.7 mmol/L (ref 3.5–5.1)
Potassium: 3.8 mmol/L (ref 3.5–5.1)
Sodium: 138 mmol/L (ref 135–145)
Sodium: 138 mmol/L (ref 135–145)
TCO2: 34 mmol/L — ABNORMAL HIGH (ref 22–32)
TCO2: 35 mmol/L — ABNORMAL HIGH (ref 22–32)
pCO2 arterial: 57 mmHg — ABNORMAL HIGH (ref 32.0–48.0)
pCO2 arterial: 69.7 mmHg (ref 32.0–48.0)
pH, Arterial: 7.357 (ref 7.350–7.450)
pH, Arterial: 7.284 — ABNORMAL LOW (ref 7.350–7.450)
pO2, Arterial: 85 mmHg (ref 83.0–108.0)
pO2, Arterial: 116 mmHg — ABNORMAL HIGH (ref 83.0–108.0)

## 2020-05-20 LAB — URINALYSIS, ROUTINE W REFLEX MICROSCOPIC
Bilirubin Urine: NEGATIVE
Glucose, UA: NEGATIVE mg/dL
Hgb urine dipstick: NEGATIVE
Ketones, ur: 5 mg/dL — AB
Leukocytes,Ua: NEGATIVE
Nitrite: NEGATIVE
Protein, ur: NEGATIVE mg/dL
Specific Gravity, Urine: 1.031 — ABNORMAL HIGH (ref 1.005–1.030)
pH: 5 (ref 5.0–8.0)

## 2020-05-20 LAB — CBC WITH DIFFERENTIAL/PLATELET
Abs Immature Granulocytes: 0.13 10*3/uL — ABNORMAL HIGH (ref 0.00–0.07)
Basophils Absolute: 0 10*3/uL (ref 0.0–0.1)
Basophils Relative: 0 %
Eosinophils Absolute: 0 10*3/uL (ref 0.0–0.5)
Eosinophils Relative: 0 %
HCT: 37 % — ABNORMAL LOW (ref 39.0–52.0)
Hemoglobin: 11 g/dL — ABNORMAL LOW (ref 13.0–17.0)
Immature Granulocytes: 1 %
Lymphocytes Relative: 6 %
Lymphs Abs: 1.2 10*3/uL (ref 0.7–4.0)
MCH: 28.2 pg (ref 26.0–34.0)
MCHC: 29.7 g/dL — ABNORMAL LOW (ref 30.0–36.0)
MCV: 94.9 fL (ref 80.0–100.0)
Monocytes Absolute: 1.2 10*3/uL — ABNORMAL HIGH (ref 0.1–1.0)
Monocytes Relative: 6 %
Neutro Abs: 15.9 10*3/uL — ABNORMAL HIGH (ref 1.7–7.7)
Neutrophils Relative %: 87 %
Platelets: 459 10*3/uL — ABNORMAL HIGH (ref 150–400)
RBC: 3.9 MIL/uL — ABNORMAL LOW (ref 4.22–5.81)
RDW: 18.3 % — ABNORMAL HIGH (ref 11.5–15.5)
WBC: 18.4 10*3/uL — ABNORMAL HIGH (ref 4.0–10.5)
nRBC: 0 % (ref 0.0–0.2)

## 2020-05-20 LAB — LACTIC ACID, PLASMA: Lactic Acid, Venous: 1.2 mmol/L (ref 0.5–1.9)

## 2020-05-20 LAB — COMPREHENSIVE METABOLIC PANEL
ALT: 18 U/L (ref 0–44)
AST: 22 U/L (ref 15–41)
Albumin: 3.6 g/dL (ref 3.5–5.0)
Alkaline Phosphatase: 63 U/L (ref 38–126)
Anion gap: 11 (ref 5–15)
BUN: 25 mg/dL — ABNORMAL HIGH (ref 8–23)
CO2: 27 mmol/L (ref 22–32)
Calcium: 8.7 mg/dL — ABNORMAL LOW (ref 8.9–10.3)
Chloride: 100 mmol/L (ref 98–111)
Creatinine, Ser: 0.82 mg/dL (ref 0.61–1.24)
GFR calc Af Amer: >60 mL/min (ref >60)
GFR calc non Af Amer: >60 mL/min (ref >60)
Glucose, Bld: 114 mg/dL — ABNORMAL HIGH (ref 70–99)
Potassium: 3.7 mmol/L (ref 3.5–5.1)
Sodium: 138 mmol/L (ref 135–145)
Total Bilirubin: 0.8 mg/dL (ref 0.3–1.2)
Total Protein: 7 g/dL (ref 6.5–8.1)

## 2020-05-20 LAB — PROTIME-INR
INR: 2.8 — ABNORMAL HIGH (ref 0.8–1.2)
Prothrombin Time: 29 seconds — ABNORMAL HIGH (ref 11.4–15.2)

## 2020-05-20 LAB — BRAIN NATRIURETIC PEPTIDE: B Natriuretic Peptide: 82.9 pg/mL (ref 0.0–100.0)

## 2020-05-20 LAB — RAPID URINE DRUG SCREEN, HOSP PERFORMED
Amphetamines: NOT DETECTED
Barbiturates: NOT DETECTED
Benzodiazepines: NOT DETECTED
Cocaine: NOT DETECTED
Opiates: NOT DETECTED
Tetrahydrocannabinol: NOT DETECTED

## 2020-05-20 LAB — SARS CORONAVIRUS 2 BY RT PCR (HOSPITAL ORDER, PERFORMED IN ~~LOC~~ HOSPITAL LAB): SARS Coronavirus 2: NEGATIVE

## 2020-05-20 MED ORDER — AMLODIPINE BESYLATE 5 MG PO TABS: 5.0000 mg | ORAL_TABLET | Freq: Every day | ORAL | Status: DC

## 2020-05-20 MED ORDER — ACETAMINOPHEN 325 MG PO TABS
650.0000 mg | ORAL_TABLET | Freq: Four times a day (QID) | ORAL | Status: DC | PRN
  Administered 2020-05-21 – 2020-05-25 (×3): 650 mg via ORAL
  Filled 2020-05-20 (×3): qty 2

## 2020-05-20 MED ORDER — VITAMIN D 25 MCG (1000 UNIT) PO TABS
1000.0000 [IU] | ORAL_TABLET | Freq: Every day | ORAL | Status: DC
  Administered 2020-05-21 – 2020-05-25 (×5): 1000 [IU] via ORAL
  Filled 2020-05-20 (×5): qty 1

## 2020-05-20 MED ORDER — FUROSEMIDE 10 MG/ML IJ SOLN
40.0000 mg | Freq: Once | INTRAMUSCULAR | Status: AC
  Administered 2020-05-20: 40 mg via INTRAVENOUS
  Filled 2020-05-20: qty 4

## 2020-05-20 MED ORDER — LOSARTAN POTASSIUM 50 MG PO TABS: 50.0000 mg | ORAL_TABLET | Freq: Every day | ORAL | Status: DC

## 2020-05-20 MED ORDER — VANCOMYCIN HCL 2000 MG/400ML IV SOLN
2000.0000 mg | Freq: Once | INTRAVENOUS | Status: AC
  Administered 2020-05-20: 2000 mg via INTRAVENOUS
  Filled 2020-05-20: qty 400

## 2020-05-20 MED ORDER — PREDNISONE 20 MG PO TABS
20.0000 mg | ORAL_TABLET | Freq: Every day | ORAL | Status: DC
  Administered 2020-05-21 – 2020-05-25 (×5): 20 mg via ORAL
  Filled 2020-05-20 (×5): qty 1

## 2020-05-20 MED ORDER — ACETAMINOPHEN 650 MG RE SUPP
650.0000 mg | Freq: Four times a day (QID) | RECTAL | Status: DC | PRN
  Filled 2020-05-20: qty 1

## 2020-05-20 MED ORDER — ALBUTEROL SULFATE HFA 108 (90 BASE) MCG/ACT IN AERS
4.0000 | INHALATION_SPRAY | Freq: Once | RESPIRATORY_TRACT | Status: AC
  Administered 2020-05-20: 4 via RESPIRATORY_TRACT
  Filled 2020-05-20: qty 6.7

## 2020-05-20 MED ORDER — SODIUM CHLORIDE 0.9 % IV SOLN
2.0000 g | Freq: Once | INTRAVENOUS | Status: AC
  Administered 2020-05-20: 2 g via INTRAVENOUS
  Filled 2020-05-20: qty 2

## 2020-05-20 MED ORDER — FAMOTIDINE 20 MG PO TABS
20.0000 mg | ORAL_TABLET | Freq: Two times a day (BID) | ORAL | Status: DC
  Administered 2020-05-21 – 2020-05-25 (×9): 20 mg via ORAL
  Filled 2020-05-20 (×9): qty 1

## 2020-05-20 MED ORDER — ASCORBIC ACID 500 MG PO TABS
500.0000 mg | ORAL_TABLET | Freq: Every day | ORAL | Status: DC
  Administered 2020-05-21 – 2020-05-25 (×5): 500 mg via ORAL
  Filled 2020-05-20 (×5): qty 1

## 2020-05-20 MED ORDER — VANCOMYCIN HCL IN DEXTROSE 1-5 GM/200ML-% IV SOLN
1000.0000 mg | Freq: Three times a day (TID) | INTRAVENOUS | Status: DC
  Administered 2020-05-21 (×2): 1000 mg via INTRAVENOUS
  Filled 2020-05-20 (×4): qty 200

## 2020-05-20 MED ORDER — ZINC SULFATE 220 (50 ZN) MG PO CAPS
220.0000 mg | ORAL_CAPSULE | Freq: Every day | ORAL | Status: DC
  Administered 2020-05-21 – 2020-05-25 (×5): 220 mg via ORAL
  Filled 2020-05-20 (×5): qty 1

## 2020-05-20 MED ORDER — ZINC GLUCONATE 50 MG PO TABS: 50.0000 mg | ORAL_TABLET | Freq: Every day | ORAL | Status: DC

## 2020-05-20 MED ORDER — WARFARIN SODIUM 4 MG PO TABS
4.0000 mg | ORAL_TABLET | Freq: Once | ORAL | Status: DC
  Filled 2020-05-20: qty 1

## 2020-05-20 MED ORDER — FLUTICASONE FUROATE-VILANTEROL 200-25 MCG/INH IN AEPB
1.0000 | INHALATION_SPRAY | Freq: Every day | RESPIRATORY_TRACT | Status: DC
  Administered 2020-05-21 – 2020-05-25 (×5): 1 via RESPIRATORY_TRACT
  Filled 2020-05-20: qty 28

## 2020-05-20 MED ORDER — WARFARIN - PHARMACIST DOSING INPATIENT: Freq: Every day | Status: DC

## 2020-05-20 MED ORDER — HYDROCHLOROTHIAZIDE 12.5 MG PO CAPS: 12.5000 mg | ORAL_CAPSULE | Freq: Every day | ORAL | Status: DC

## 2020-05-20 MED ORDER — SODIUM CHLORIDE 0.9 % IV SOLN
2.0000 g | Freq: Three times a day (TID) | INTRAVENOUS | Status: DC
  Administered 2020-05-20 – 2020-05-23 (×8): 2 g via INTRAVENOUS
  Filled 2020-05-20 (×12): qty 2

## 2020-05-20 MED ORDER — METHYLPREDNISOLONE SODIUM SUCC 125 MG IJ SOLR
80.0000 mg | Freq: Once | INTRAMUSCULAR | Status: AC
  Administered 2020-05-20: 80 mg via INTRAVENOUS
  Filled 2020-05-20: qty 2

## 2020-05-20 MED ORDER — LOSARTAN POTASSIUM-HCTZ 50-12.5 MG PO TABS: 1.0000 | ORAL_TABLET | Freq: Every day | ORAL | Status: DC

## 2020-05-20 NOTE — Progress Notes (Signed)
Pharmacy Antibiotic Note  Jesus Russell is a 69 y.o. male admitted on 05/20/2020 with sepsis.  Pharmacy has been consulted for vancomycin dosing. WBC elevated. SCr wnl. Of note, patient has received a dose of cefepime in the ED    Plan: -Vancomycin 2 gm IV load followed by vancomycin 1 gm IV Q 8 hours -Monitor CBC, renal fx, cultures and clinical progress -VT as indicated  -F/u maintenance gram negative therapy   Height: 5' 9" (175.3 cm) Weight: 97.5 kg (214 lb 15.2 oz) IBW/kg (Calculated) : 70.7  Temp (24hrs), Avg:97.5 F (36.4 C), Min:97.5 F (36.4 C), Max:97.5 F (36.4 C)  No results for input(s): WBC, CREATININE, LATICACIDVEN, VANCOTROUGH, VANCOPEAK, VANCORANDOM, GENTTROUGH, GENTPEAK, GENTRANDOM, TOBRATROUGH, TOBRAPEAK, TOBRARND, AMIKACINPEAK, AMIKACINTROU, AMIKACIN in the last 168 hours.  Estimated Creatinine Clearance: 87.1 mL/min (by C-G formula based on SCr of 0.96 mg/dL).    No Known Allergies  Antimicrobials this admission: Vanc 5/28 >>  Cefepime 5/28 >>   Dose adjustments this admission:  Microbiology results: 5/28 BCx:  5/28 UCx:     Thank you for allowing pharmacy to be a part of this patient's care.  Albertina Parr, PharmD., BCPS, BCCCP Clinical Pharmacist Clinical phone for 05/20/20 until 3:30pm: [phone] If after 3:30pm, please refer to [facility] for unit-specific pharmacist

## 2020-05-20 NOTE — ED Provider Notes (Signed)
[facility] Provider Note   CSN: [id number] Arrival date & time: 05/20/20  1148     History Chief Complaint  Patient presents with  . Shortness of Breath    [facility] is a 69 y.o. male.  HPI   69 year old male brought in by EMS from a halfway house.  EMS was called by her roommate because apparently patient has had labored breathing and hallucinations for the past several days.  Patient denies hallucinations to me.  He is drowsy but does answer questions appropriately.  He denies any acute complaints. He endorses some back pain but recently had kyphoplasty and pain has been improving since then. He says he feels "fine."  Noted to be tachypneic for EMS and hypoxic on room air in the 80s.  Patient uses 2L oxygen via [place] on a PRN basis at night.  He specifically denies any shortness of breath to me though.  He states that his lower extremity swelling waxes and wanes.  Currently he does not feel that it is signficantly worse than his baseline.  He reports compliance with all his medications.  Past Medical History:  Diagnosis Date  . Acute on chronic respiratory failure with hypoxia ([place])   . COVID-19 virus infection   . DVT of lower extremity, bilateral ([place])   . Epistaxis   . Hypertension   . Incarcerated umbilical hernia 29/5284   Status post repair  . Lupus anticoagulant positive    Per records from [facility]  . 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 Pulmonary artery hypertension ([place]) 04/2011   Mildly elevated pulmonary artery pressure in setting of PE (from [facility])  . Pulmonary embolism associated with COVID-19 ([place])   . Subdural hematoma ([facility])    Remote episode, occurred when taking excedrin and warfarin.    Patient Active Problem List   Diagnosis Date Noted  . COVID-19 virus infection   . Bleeding from the nose   . Lower extremity edema 02/03/2020  . Back  pain 01/25/2020  . Compression fracture of T6 vertebra ([place]) 01/20/2020  . Cubital tunnel syndrome on right 01/20/2020  . Osteoporosis 01/20/2020  . Hepatic steatosis 10/29/2019  . Cholelithiasis 10/29/2019  . Obstructive sleep apnea 10/29/2019  . Need for pneumococcal vaccine 10/29/2019  . Hypertension 10/06/2019  . Encounter for central line placement 10/06/2019  . Long term (current) use of anticoagulants 09/14/2019  . History of DVT (deep vein thrombosis) 09/14/2019  . Pulmonary embolism on long-term anticoagulation therapy ([place]) 09/09/2019  . Sarcoidosis 09/09/2019   Past Surgical History:  Procedure Laterality Date  . BRAIN SURGERY    . HERNIA REPAIR    . IR KYPHO EA ADDL LEVEL THORACIC OR LUMBAR  05/04/2020  . IR KYPHO EA ADDL LEVEL THORACIC OR LUMBAR  05/04/2020  . IR KYPHO THORACIC WITH BONE BIOPSY  05/04/2020  . IR RADIOLOGIST EVAL & MGMT  04/07/2020  . NASAL ENDOSCOPY WITH EPISTAXIS CONTROL N/A 02/24/2020   Procedure: NASAL ENDOSCOPY WITH EPISTAXIS CONTROL;  Surgeon: [name], MD;  Location: [facility];  Service: ENT;  Laterality: N/A;      Family History  Adopted: Yes    Social History   Tobacco Use  . Smoking status: Former Smoker    Packs/day: 1.50    Years: 23.00    Pack years: 34.50    Types: Cigarettes    Quit date: 1995  Years since quitting: 26.4  . Smokeless tobacco: Never Used  Substance Use Topics  . Alcohol use: Never  . Drug use: Not Currently    Types: "Crack" cocaine    Comment: quit 2008    Home Medications Prior to Admission medications   Medication Sig Start Date End Date Taking? Authorizing Provider  alendronate (FOSAMAX) 70 MG tablet Take 1 tablet (70 mg total) by mouth every 7 (seven) days. Take with a full glass of water on an empty stomach. 03/31/20 03/31/21 Yes Jeanmarie Hubert, MD  amLODipine (NORVASC) 5 MG tablet Take 1 tablet (5 mg total) by mouth daily. 03/31/20 03/31/21 Yes Jeanmarie Hubert, MD  Ascorbic Acid (VITAMIN C) 500 MG CAPS  Take 500 mg by mouth daily.   Yes [provider]  BREO ELLIPTA 200-25 MCG/INH AEPB INHALE 1 PUFF INTO THE LUNGS DAILY Patient taking differently: Inhale 1 puff into the lungs daily.  03/17/20  Yes Velna Ochs, MD  cholecalciferol (VITAMIN D3) 25 MCG (1000 UT) tablet Take 1,000 Units by mouth daily.   Yes [provider]  famotidine (PEPCID) 20 MG tablet Take 1 tablet (20 mg total) by mouth 2 (two) times daily. 04/27/20  Yes Aldine Contes, MD  furosemide (LASIX) 20 MG tablet TAKE 1 TABLET(20 MG) BY MOUTH DAILY AS NEEDED FOR FLUID RETENTION OR SWELLING Patient taking differently: Take 20 mg by mouth daily as needed for fluid. TAKE 1 TABLET(20 MG) BY MOUTH DAILY AS NEEDED FOR FLUID RETENTION OR SWELLING 04/27/20  Yes Aldine Contes, MD  losartan-hydrochlorothiazide (HYZAAR) 50-12.5 MG tablet Take 1 tablet by mouth daily.   Yes [provider]  omega-3 acid ethyl esters (LOVAZA) 1 g capsule Take 1 capsule (1 g total) by mouth 2 (two) times daily. Patient taking differently: Take 1 g by mouth daily.  03/31/20  Yes Jeanmarie Hubert, MD  Potassium Chloride ER 20 MEQ TBCR TAKE 1 TABLET BY MOUTH DAILY Patient taking differently: Take 20 mEq by mouth daily.  04/04/20  Yes Jeanmarie Hubert, MD  predniSONE (DELTASONE) 20 MG tablet Take 1 tablet (20 mg total) by mouth daily with breakfast. 04/27/20 05/27/20 Yes Aldine Contes, MD  warfarin (COUMADIN) 4 MG tablet Take one-and-one-half (1 & 1/2)  tablets on Mondays and Thursdays. All other day, take one (1) tablet 04/25/20  Yes Jorene Guest B, RPH-CPP  zinc gluconate 50 MG tablet Take 50 mg by mouth daily.   Yes [provider]  enoxaparin (LOVENOX) 150 MG/ML injection Inject 1 mL (150 mg total) into the skin daily. Inject at level of waist-band, two inches away from your "belly-button." DO NOT ADMINISTER DAY OF PROCEDURE. Patient not taking: Reported on 05/09/2020 05/02/20   Pennie Banter, RPH-CPP  hydrochlorothiazide  (HYDRODIURIL) 25 MG tablet Take 0.5 tablets (12.5 mg total) by mouth daily. Patient not taking: Reported on 05/20/2020 04/27/20   Aldine Contes, MD    Allergies    Patient has no known allergies.  Review of Systems   Review of Systems All systems reviewed and negative, other than as noted in HPI.  Physical Exam Updated Vital Signs BP 116/80   Pulse (!) 105   Temp (!) 97.5 F (36.4 C) (Oral)   Resp (!) 26   Ht _0  (1.753 m)   Wt 97.5 kg   SpO2 96%   BMI 31.74 kg/m   Physical Exam Vitals and nursing note reviewed.  Constitutional:      General: He is not in acute distress.    Appearance: He  is well-developed. He is obese.  HENT:     Head: Normocephalic and atraumatic.  Eyes:     General:        Right eye: No discharge.        Left eye: No discharge.     Conjunctiva/sclera: Conjunctivae normal.  Cardiovascular:     Rate and Rhythm: Regular rhythm. Tachycardia present.     Heart sounds: Normal heart sounds. No murmur. No friction rub. No gallop.   Pulmonary:     Effort: No respiratory distress.     Breath sounds: Wheezing present.     Comments: Tachypnea Abdominal:     Palpations: Abdomen is soft.     Tenderness: There is no abdominal tenderness.     Comments: Subacute appearing ecchymosis to anterior abdomen.  Seems distended but soft and is nontender.  Musculoskeletal:        General: No tenderness.     Cervical back: Neck supple.     Right lower leg: Edema present.     Left lower leg: Edema present.  Skin:    General: Skin is warm and dry.  Neurological:     Mental Status: He is alert.  Psychiatric:        Behavior: Behavior normal.        Thought Content: Thought content normal.     ED Results / Procedures / Treatments   Labs (all labs ordered are listed, but only abnormal results are displayed) Labs Reviewed  URINE CULTURE - Abnormal; Notable for the following components:      Result Value   Culture 20,000 COLONIES/mL ENTEROCOCCUS FAECALIS (*)      All other components within normal limits  CBC WITH DIFFERENTIAL/PLATELET - Abnormal; Notable for the following components:   WBC 18.4 (*)    RBC 3.90 (*)    Hemoglobin 11.0 (*)    HCT 37.0 (*)    MCHC 29.7 (*)    RDW 18.3 (*)    Platelets 459 (*)    Neutro Abs 15.9 (*)    Monocytes Absolute 1.2 (*)    Abs Immature Granulocytes 0.13 (*)    All other components within normal limits  URINALYSIS, ROUTINE W REFLEX MICROSCOPIC - Abnormal; Notable for the following components:   Specific Gravity, Urine 1.031 (*)    Ketones, ur 5 (*)    All other components within normal limits  COMPREHENSIVE METABOLIC PANEL - Abnormal; Notable for the following components:   Glucose, Bld 114 (*)    BUN 25 (*)    Calcium 8.7 (*)    All other components within normal limits  PROTIME-INR - Abnormal; Notable for the following components:   Prothrombin Time 29.0 (*)    INR 2.8 (*)    All other components within normal limits  BASIC METABOLIC PANEL - Abnormal; Notable for the following components:   Glucose, Bld 115 (*)    Calcium 8.5 (*)    All other components within normal limits  CBC - Abnormal; Notable for the following components:   WBC 14.2 (*)    RBC 3.80 (*)    Hemoglobin 10.7 (*)    HCT 35.9 (*)    MCHC 29.8 (*)    RDW 17.2 (*)    All other components within normal limits  PROTIME-INR - Abnormal; Notable for the following components:   Prothrombin Time 30.9 (*)    INR 3.1 (*)    All other components within normal limits  BLOOD GAS, ARTERIAL - Abnormal; Notable for the  following components:   pCO2 arterial 54.5 (*)    Bicarbonate 31.9 (*)    Acid-Base Excess 6.9 (*)    All other components within normal limits  POCT I-STAT 7, (LYTES, BLD GAS, ICA,H+H) - Abnormal; Notable for the following components:   pCO2 arterial 57.0 (*)    Bicarbonate 32.0 (*)    TCO2 34 (*)    Acid-Base Excess 5.0 (*)    HCT 37.0 (*)    Hemoglobin 12.6 (*)    All other components within normal limits  POCT  I-STAT 7, (LYTES, BLD GAS, ICA,H+H) - Abnormal; Notable for the following components:   pH, Arterial 7.284 (*)    pCO2 arterial 69.7 (*)    pO2, Arterial 116 (*)    Bicarbonate 33.0 (*)    TCO2 35 (*)    Acid-Base Excess 4.0 (*)    HCT 36.0 (*)    Hemoglobin 12.2 (*)    All other components within normal limits  POCT I-STAT 7, (LYTES, BLD GAS, ICA,H+H) - Abnormal; Notable for the following components:   pH, Arterial 7.334 (*)    pCO2 arterial 64.7 (*)    Bicarbonate 34.6 (*)    TCO2 37 (*)    Acid-Base Excess 7.0 (*)    HCT 35.0 (*)    Hemoglobin 11.9 (*)    All other components within normal limits  CULTURE, BLOOD (ROUTINE X 2)  CULTURE, BLOOD (ROUTINE X 2)  SARS CORONAVIRUS 2 BY RT PCR (HOSPITAL ORDER, [facility])  RESPIRATORY PANEL BY PCR  MRSA PCR SCREENING  BRAIN NATRIURETIC PEPTIDE  LACTIC ACID, PLASMA  RAPID URINE DRUG SCREEN, HOSP PERFORMED  BLOOD GAS, ARTERIAL  BLOOD GAS, ARTERIAL  PROTIME-INR  I-STAT ARTERIAL BLOOD GAS, ED    EKG EKG Interpretation  Date/Time:  Friday May 20 2020 12:12:01 EDT Ventricular Rate:  101 PR Interval:    QRS Duration: 136 QT Interval:  366 QTC Calculation: 475 R Axis:   13 Text Interpretation: Sinus tachycardia Right bundle branch block similar to previous tracing from 02/14/20 Confirmed by Virgel Manifold [phone]) on 05/20/2020 12:20:10 PM   Radiology DG Chest Portable 1 View  Result Date: 05/20/2020 CLINICAL DATA:  Dyspnea EXAM: PORTABLE CHEST 1 VIEW COMPARISON:  03/07/2020 FINDINGS: Cardiac shadow is stable. The aortic calcifications are noted. Chronic fibrotic changes are noted in both lungs consistent with the given clinical history of sarcoidosis. No focal infiltrate or sizable effusion is seen. No bony abnormality is noted. IMPRESSION: Chronic scarring bilaterally without acute abnormality. Electronically Signed   By: Inez Catalina M.D.   On: 05/20/2020 15:59    Procedures Procedures (including  critical care time)  Medications Ordered in ED Medications  vancomycin (VANCOREADY) IVPB 2000 mg/400 mL (2,000 mg Intravenous New Bag/Given 05/20/20 1330)  vancomycin (VANCOCIN) IVPB 1000 mg/200 mL premix (has no administration in time range)  albuterol (VENTOLIN HFA) 108 (90 Base) MCG/ACT inhaler 4 puff (4 puffs Inhalation Given 05/20/20 1236)  ceFEPIme (MAXIPIME) 2 g in sodium chloride 0.9 % 100 mL IVPB (0 g Intravenous Stopped 05/20/20 1300)    ED Course  I have reviewed the triage vital signs and the nursing notes.  Pertinent labs & imaging results that were available during my care of the patient were reviewed by me and considered in my medical decision making (see chart for details).    MDM Rules/Calculators/A&P  69 year old male with an drowsiness, hypoxemia and questionable hallucinations.  Patient somewhat drowsy on my exam but is actually answering all questions pretty appropriately.  He denies hallucinations to me.  PCO2 elevated on ABG appears to be pretty well compensated.  Little bit hypoxemic.  Apparently only uses supplemental oxygen on a as needed basis at night.  Is needing 4 L to keep his O2 sats in the 90s currently.  Related to underlying sarcoidosis?  Chest x-ray  with no acute abnormality.  Only mild wheezing on exam but he is tachypneic. Given some albuterol and also dose of steroids.  History of DVT/PE but INR is therapeutic at 2.8 making PE very unlikely.  He was given empiric antibiotics X for possible sepsis although work-up thus far does not necessarily showing this.  He does have a leukocytosis but currently undergoing a steroid taper.  He reports that he is currently on 20 mg daily.  Final Clinical Impression(s) / ED Diagnoses Final diagnoses:  Abdominal distention  Dyspnea, unspecified type    Rx / DC Orders ED Discharge Orders    None       Virgel Manifold, MD 05/21/20 2003

## 2020-05-20 NOTE — H&P (Signed)
Date: 05/20/2020               Patient Name:  Jesus Russell MRN: [id number]  DOB: [birth year] Age / Sex: 69 y.o., male   PCP: Jeanmarie Hubert, MD         Medical Service: Internal Medicine Teaching Service         Attending Physician: Dr. Oda Kilts, MD    First Contact: Dr. Madilyn Fireman Pager: [phone]  Second Contact: Dr. 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  The patient was unable to provide [place] with a significant history during our evaluation due to his dyspnea and altered mentation. He was able to provide [place] with his name and that he was in a hospital, but not the location, date or recent events. He attempted to provide the reason for his admission but seemed unable to form the words to explain this. He did not back pain but could not localize this. He also endorses a headache.  Per sister, he notified her that he was having hallucinations after being bedridden from his Kyphoplasty of T8, T9 and T10 on 05/04/20. This morning when she checked on him he thought he was in the forest, had a roommate, and was on a construction site. His mentation improved quickly after a brief conversation with his sister and he went back to sleep. She decided to take him to see his physician but when she attempted to wake him again he was very somnolent, halfway off the bed and easily startled eventually falling to the floor. This is when she decided that she could not take him to the  hospital alone and called EMS. She did not think he had been given any pain medication.  I spoke with his sister regarding his code status and potential need for intubation if his status declined. She stated clearly that he would want to be intubated again if this is necessary. He made this clear to her during a recent conversation after he was released from the hospital earlier in the year.   Due to his increased supplemental oxygen demand and mentation change admission was requested.  Meds:  Current Meds  Medication Sig  . alendronate (FOSAMAX) 70 MG tablet Take 1 tablet (70 mg total) by mouth every 7 (seven) days. Take with a full glass of water on an empty stomach.  Marland Kitchen amLODipine (NORVASC) 5 MG tablet Take 1 tablet (5 mg total) by mouth daily.  . Ascorbic Acid (VITAMIN C) 500 MG CAPS Take 500 mg by mouth daily.  Marland Kitchen BREO ELLIPTA 200-25 MCG/INH AEPB INHALE 1 PUFF INTO THE LUNGS DAILY (Patient taking differently: Inhale 1 puff into the lungs daily. )  . cholecalciferol (VITAMIN D3) 25 MCG (1000 UT) tablet Take 1,000 Units by mouth daily.  . famotidine (PEPCID) 20 MG tablet Take 1 tablet (20 mg total) by mouth 2 (two) times daily.  . furosemide (LASIX) 20 MG tablet TAKE 1 TABLET(20 MG) BY MOUTH DAILY AS  NEEDED FOR FLUID RETENTION OR SWELLING (Patient taking differently: Take 20 mg by mouth daily as needed for fluid. TAKE 1 TABLET(20 MG) BY MOUTH DAILY AS NEEDED FOR FLUID RETENTION OR SWELLING)  . losartan-hydrochlorothiazide (HYZAAR) 50-12.5 MG tablet Take 1 tablet by mouth daily.  Marland Kitchen omega-3 acid ethyl esters (LOVAZA) 1 g capsule Take 1 capsule (1 g total) by mouth 2 (two) times daily. (Patient taking differently: Take 1 g by mouth daily. )  . Potassium Chloride ER 20 MEQ TBCR TAKE 1 TABLET BY MOUTH DAILY (Patient taking differently: Take 20 mEq by mouth daily. )  . predniSONE (DELTASONE) 20 MG tablet Take 1 tablet (20 mg total) by mouth daily with breakfast.  . warfarin (COUMADIN) 4 MG tablet Take  one-and-one-half (1 & 1/2)  tablets on Mondays and Thursdays. All other day, take one (1) tablet  . zinc gluconate 50 MG tablet Take 50 mg by mouth daily.   Allergies: Allergies as of 05/20/2020  . (No Known Allergies)   Past Medical History:  Diagnosis Date  . Acute on chronic respiratory failure with hypoxia ([place])   . COVID-19 virus infection   . DVT of lower extremity, bilateral ([place])   . Epistaxis   . Hypertension   . Incarcerated umbilical hernia 98/9211   Status post repair  . Lupus anticoagulant positive    Per records from [facility]  . 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 Pulmonary artery hypertension ([place]) 04/2011   Mildly elevated pulmonary artery pressure in setting of PE (from [facility])  . Pulmonary embolism associated with COVID-19 ([place])   . Subdural hematoma (HCC)    Remote episode, occurred when taking excedrin and warfarin.    Family History:  Family History  Adopted: Yes   Social History:  Social History   Tobacco Use  . Smoking status: Former Smoker    Packs/day: 1.50    Years: 23.00    Pack years: 34.50    Types: Cigarettes    Quit date: 1995    Years since quitting: 26.4  . Smokeless tobacco: Never Used  Substance Use Topics  . Alcohol use: Never  . Drug use: Not Currently    Types: "Crack" cocaine    Comment: quit 2008  Unable to confirm the above due to the patients encephalopathy.   Review of Systems: A complete ROS was negative except as per HPI.   Physical Exam: Blood pressure 113/81, pulse 86, temperature (!) 97.5 F (36.4 C), temperature source Oral, resp. rate 20, height _0  (1.753 m), weight 97.5 kg, SpO2 99 %. Physical Exam Constitutional:      General: He is in acute distress.     Appearance: He is obese. He is ill-appearing.     Interventions: He is not intubated. HENT:     Head: Normocephalic.     Mouth/Throat:     Mouth: Mucous membranes are  moist.  Eyes:     Pupils: Pupils are equal, round, and reactive to light.  Neck:     Thyroid: No thyromegaly.     Vascular: No JVD.  Cardiovascular:     Rate and Rhythm: Regular rhythm. Tachycardia present.     Heart sounds: No murmur.  Pulmonary:     Effort: Tachypnea, accessory muscle usage and respiratory distress present. He is not intubated.     Breath sounds: No stridor. Examination of the right-upper field reveals rales. Examination of  the left-upper field reveals rales. Decreased breath sounds, wheezing, rhonchi and rales present.  Chest:     Chest wall: No tenderness or edema.  Abdominal:     General: There is distension.     Palpations: There is no fluid wave.     Tenderness: There is no rebound.  Musculoskeletal:     Right lower leg: Edema (extending proximally to the knees) present.     Left lower leg: Edema (extending proximally to the knees) present.  Skin:    Capillary Refill: Capillary refill takes less than 2 seconds.  Neurological:     Mental Status: He is disoriented and confused.     Comments: Strength roughly 5/5 upper, patient did not permit assessment of the lower    EKG: personally reviewed my interpretation is sinus rhythm with RBBB but not marked ST changes as compared to prior noted  CXR: personally reviewed my interpretation is possible   Assessment & Plan by Problem: Principal Problem:   Respiratory failure with hypoxia and hypercapnia (HCC) Active Problems:   Pulmonary embolism on long-term anticoagulation therapy ([place])   Sarcoidosis   History of DVT (deep vein thrombosis)   Hypertension   Osteoporosis   Right heart failure ([place])  Summary: Jesus Russell is a 69 yo M w/ a PMHx notable for 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 Patient noted to have increased supplemental oxygen demand as well as an increased CO2 at 57. Etiology of his respiratory failure with hypoxia and hypercapnia is unknown.  Assessment and Plan: Acute encephalopathy with hallucinations: Respiratory failure with hypoxia and hypercapnia: Mental status likely 2/2 to hypercapnia. Etiology uncertain with regard to the hypercarbia and hypoxia. Possibly sepsis but no clear source and I am more concerned that he is volume overloaded with RV failure from cor pulmonale causing his symptoms and prefer to treat this as the primary. He has an increased WBC to 18.4 but is on chronic corticosteroids. He was afebrile, normotensive, HR initially >100 and a RR >18. He is SIRS criteria positive but no identifiable source has been found. Given his findings and respiratory decline a pulmonary source would seem most likely but his CXR appears relatively uncharged except for a degree of rotation. However, given his severe sarcoidosis with scarring one can not rule out underlying infection in its early stage and continuing on antibiotics seems reasonable at least until blood culture return in 24 hours.  UDS negative. Initial ABG 7.357/57/85/32 with repeat 9 hours later after 71mn on Bipap 7.284/69.7/116/33. Patient able to follows some basic commands on Bipap and complaining of persistent headache.  I spoke with the on call PCCM physician who recommended what we did and a slight Bipap setting adjustment with the Ipap 18, Epap 5 and RR 20 continuing on the FiO2 of 40%. They did indicate that they would assess the patient.  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 Echo on 02/16/2020 demonstrated an LVEF of 50-55% but with a markedly dilated RV and severely reduced RV function. His tricuspid regurgitant velocity was 3.55ms indicative of pulmonary hypertension with an estimated RV pressure of 46.959mg. Additionally, he had noted and we observed stable but persistent bilateral lower extremity edema to his knees proximally that was +2 and pitting. 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 Unable to complete a continued corticosteroid taper over the past several months per chart review and remained on 52m53me has been assigned to follow with Dr. DesaShearon Stallsh the most recent note as of 11/08/2020. It appears that she wanted to start him on additional therapy but he did not follow-up in January, likely due to his COVID infection. She noted that it appears his disease is progressed with a high probability of significant pulmonary fibrosis.  Plan: -Continue prednisone 52mg37mly -Consider pulmonology consult if he does not end up needing PCCM and does not improve  History of PE and BLE DVTs in February 2021: As recently as February 2021. Has antiphospholipid antibody syndrome per the chart in addition to his sarcoidosis. Initially there was suspicion for recurrent PE given his sarcoidosis and history but his INR is therapeutic to 2.8 greatly reducing the likelihood of this. Not to mention he is both hypoxic and hypercapnic which is more consistent with a primary ventilation issue than a perfusion  one.  Plan: -Warfarin per pharmacy, granted he is able to take PO meds in the morning -Start Lovenox if he remains NPO or unable to swallow  HTN: Holding home antihypertensives due to possible sepsis and low BP. Resume as needed.  Diet: NPO Code: Prior, Full Fluids: N/A DVT PPX: Warfarin, or lovenox if unable to tolerate PO Dispo: Admit patient to Inpatient with expected length of stay greater than 2 midnights.  Signed: LawreKathi LudwigCone Centinela Hospital Medical Centerrnal Medicine, PGY-3  Please see Attending A/P and/or Addendum for final recommendations.

## 2020-05-20 NOTE — ED Notes (Signed)
RN called Main lab and spoke to [place]. He stated he is able to add on Rapid drug screen.

## 2020-05-20 NOTE — ED Notes (Signed)
Dr. Court Joy paged in regards to pt ABG results. Dr. Court Joy paging CCM to see pt.

## 2020-05-20 NOTE — ED Triage Notes (Signed)
Pt transported from [facility]. Per roommate pt has has hallucinations for a few days, pt found to have labored respirations today, with significant swelling to bilat LE. Pt reports he is compliant with meds. Pt on 2L as needed 02, pt now on 4L [place].

## 2020-05-20 NOTE — Progress Notes (Signed)
Pharmacy Antibiotic Note  Jesus Russell is a 69 y.o. male admitted on 05/20/2020 with sepsis.  Pharmacy has been consulted for vancomycin dosing and cefepime. Pt is afebrile but WBC is elevated at 18.4. Scr is WNL and lactic acid is normal.   Plan: Continue vancomycin 1 gm IV Q 8 hours Cefepime 2gm IV Q8H F/u renal fxn, C&S, clinical status and trough at SS  Height: _0  (175.3 cm) Weight: 97.5 kg (214 lb 15.2 oz) IBW/kg (Calculated) : 70.7  Temp (24hrs), Avg:97.5 F (36.4 C), Min:97.5 F (36.4 C), Max:97.5 F (36.4 C)  Recent Labs  Lab 05/20/20 1228 05/20/20 1229 05/20/20 1338  WBC  --  18.4*  --   CREATININE  --   --  0.82  LATICACIDVEN 1.2  --   --     Estimated Creatinine Clearance: 102 mL/min (by C-G formula based on SCr of 0.82 mg/dL).    No Known Allergies  Antimicrobials this admission: Vanc 5/28 >>  Cefepime 5/28 >>   Dose adjustments this admission:  Microbiology results: Pending  Salome Arnt, PharmD, BCPS Clinical Pharmacist Please see AMION for all pharmacy numbers 05/20/2020 10:52 PM

## 2020-05-20 NOTE — Progress Notes (Signed)
ANTICOAGULATION CONSULT NOTE - Initial Consult  Pharmacy Consult for warfarin Indication: hx VTE  No Known Allergies  Patient Measurements: Height: _0  (175.3 cm) Weight: 97.5 kg (214 lb 15.2 oz) IBW/kg (Calculated) : 70.7  Vital Signs: Temp: 97.5 F (36.4 C) (05/28 1208) Temp Source: Oral (05/28 1208) BP: 140/82 (05/28 2100) Pulse Rate: 93 (05/28 2100)  Labs: Recent Labs    05/20/20 1211 05/20/20 1211 05/20/20 1229 05/20/20 1338 05/20/20 1433 05/20/20 2109  HGB 12.6*   < > 11.0*  --   --  12.2*  HCT 37.0*  --  37.0*  --   --  36.0*  PLT  --   --  459*  --   --   --   LABPROT  --   --   --   --  29.0*  --   INR  --   --   --   --  2.8*  --   CREATININE  --   --   --  0.82  --   --    < > = values in this interval not displayed.    Estimated Creatinine Clearance: 102 mL/min (by C-G formula based on SCr of 0.82 mg/dL).   Medical History: Past Medical History:  Diagnosis Date  . Acute on chronic respiratory failure with hypoxia ([place])   . COVID-19 virus infection   . DVT of lower extremity, bilateral ([place])   . Epistaxis   . Hypertension   . Incarcerated umbilical hernia 41/7127   Status post repair  . Lupus anticoagulant positive    Per records from [facility]  . 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 Pulmonary artery hypertension ([place]) 04/2011   Mildly elevated pulmonary artery pressure in setting of PE (from [facility])  . Pulmonary embolism associated with COVID-19 ([place])   . Subdural hematoma (HCC)    Remote episode, occurred when taking excedrin and warfarin.    Assessment: 61 yom presented to the ED with AMS and SOB. He is on chronic warfarin for history of VTE. INR is therapeutic at 2.8. Hgb is low at 11 and platelets are elevated. No bleeding noted.   PTA regimen: 49m on Mon + Thurs, 431mall other days  Goal of Therapy:  INR 2-3 Monitor platelets by anticoagulation protocol:  Yes   Plan:  Warfarin 3m82mO x 1 tonight Daily INR  Ed Rayson, RacRande Lawman28/2021,9:30 PM

## 2020-05-20 NOTE — ED Notes (Signed)
Taken to CT.

## 2020-05-21 ENCOUNTER — Inpatient Hospital Stay (HOSPITAL_COMMUNITY): Payer: Medicare Other

## 2020-05-21 DIAGNOSIS — J9602 Acute respiratory failure with hypercapnia: Secondary | ICD-10-CM

## 2020-05-21 DIAGNOSIS — J9601 Acute respiratory failure with hypoxia: Secondary | ICD-10-CM

## 2020-05-21 DIAGNOSIS — Z86718 Personal history of other venous thrombosis and embolism: Secondary | ICD-10-CM

## 2020-05-21 DIAGNOSIS — D869 Sarcoidosis, unspecified: Secondary | ICD-10-CM

## 2020-05-21 DIAGNOSIS — I50812 Chronic right heart failure: Secondary | ICD-10-CM

## 2020-05-21 DIAGNOSIS — I2699 Other pulmonary embolism without acute cor pulmonale: Secondary | ICD-10-CM

## 2020-05-21 LAB — BASIC METABOLIC PANEL
Anion gap: 11 (ref 5–15)
BUN: 18 mg/dL (ref 8–23)
CO2: 29 mmol/L (ref 22–32)
Calcium: 8.5 mg/dL — ABNORMAL LOW (ref 8.9–10.3)
Chloride: 99 mmol/L (ref 98–111)
Creatinine, Ser: 0.72 mg/dL (ref 0.61–1.24)
GFR calc Af Amer: >60 mL/min (ref >60)
GFR calc non Af Amer: >60 mL/min (ref >60)
Glucose, Bld: 115 mg/dL — ABNORMAL HIGH (ref 70–99)
Potassium: 3.8 mmol/L (ref 3.5–5.1)
Sodium: 139 mmol/L (ref 135–145)

## 2020-05-21 LAB — RESPIRATORY PANEL BY PCR

## 2020-05-21 LAB — CBC
HCT: 35.9 % — ABNORMAL LOW (ref 39.0–52.0)
Hemoglobin: 10.7 g/dL — ABNORMAL LOW (ref 13.0–17.0)
MCH: 28.2 pg (ref 26.0–34.0)
MCHC: 29.8 g/dL — ABNORMAL LOW (ref 30.0–36.0)
MCV: 94.5 fL (ref 80.0–100.0)
Platelets: 394 10*3/uL (ref 150–400)
RBC: 3.8 MIL/uL — ABNORMAL LOW (ref 4.22–5.81)
RDW: 17.2 % — ABNORMAL HIGH (ref 11.5–15.5)
WBC: 14.2 10*3/uL — ABNORMAL HIGH (ref 4.0–10.5)
nRBC: 0 % (ref 0.0–0.2)

## 2020-05-21 LAB — ECHOCARDIOGRAM COMPLETE
Height: 69 in
Weight: 3492.09 oz

## 2020-05-21 LAB — BLOOD GAS, ARTERIAL
Acid-Base Excess: 6.9 mmol/L — ABNORMAL HIGH (ref 0.0–2.0)
Bicarbonate: 31.9 mmol/L — ABNORMAL HIGH (ref 20.0–28.0)
Drawn by: 358491
FIO2: 36
O2 Saturation: 97.7 %
Patient temperature: 36.9
pCO2 arterial: 54.5 mmHg — ABNORMAL HIGH (ref 32.0–48.0)
pH, Arterial: 7.385 (ref 7.350–7.450)
pO2, Arterial: 106 mmHg (ref 83.0–108.0)

## 2020-05-21 LAB — POCT I-STAT 7, (LYTES, BLD GAS, ICA,H+H)
Acid-Base Excess: 7 mmol/L — ABNORMAL HIGH (ref 0.0–2.0)
Bicarbonate: 34.6 mmol/L — ABNORMAL HIGH (ref 20.0–28.0)
Calcium, Ion: 1.21 mmol/L (ref 1.15–1.40)
HCT: 35 % — ABNORMAL LOW (ref 39.0–52.0)
Hemoglobin: 11.9 g/dL — ABNORMAL LOW (ref 13.0–17.0)
O2 Saturation: 97 %
Patient temperature: 97.8
Potassium: 3.8 mmol/L (ref 3.5–5.1)
Sodium: 138 mmol/L (ref 135–145)
TCO2: 37 mmol/L — ABNORMAL HIGH (ref 22–32)
pCO2 arterial: 64.7 mmHg — ABNORMAL HIGH (ref 32.0–48.0)
pH, Arterial: 7.334 — ABNORMAL LOW (ref 7.350–7.450)
pO2, Arterial: 97 mmHg (ref 83.0–108.0)

## 2020-05-21 LAB — PROTIME-INR
INR: 3.1 — ABNORMAL HIGH (ref 0.8–1.2)
Prothrombin Time: 30.9 seconds — ABNORMAL HIGH (ref 11.4–15.2)

## 2020-05-21 LAB — MRSA PCR SCREENING: MRSA by PCR: NEGATIVE

## 2020-05-21 MED ORDER — CHLORHEXIDINE GLUCONATE 0.12 % MT SOLN
15.0000 mL | Freq: Two times a day (BID) | OROMUCOSAL | Status: DC
  Administered 2020-05-21 – 2020-05-25 (×9): 15 mL via OROMUCOSAL
  Filled 2020-05-21 (×9): qty 15

## 2020-05-21 MED ORDER — WARFARIN SODIUM 3 MG PO TABS
3.0000 mg | ORAL_TABLET | Freq: Once | ORAL | Status: AC
  Administered 2020-05-21: 3 mg via ORAL
  Filled 2020-05-21: qty 1

## 2020-05-21 MED ORDER — KETOROLAC TROMETHAMINE 15 MG/ML IJ SOLN
15.0000 mg | Freq: Once | INTRAMUSCULAR | Status: AC
  Administered 2020-05-21: 15 mg via INTRAVENOUS
  Filled 2020-05-21: qty 1

## 2020-05-21 MED ORDER — LIDOCAINE 5 % EX PTCH
1.0000 | MEDICATED_PATCH | CUTANEOUS | Status: DC
  Administered 2020-05-21 – 2020-05-25 (×5): 1 via TRANSDERMAL
  Filled 2020-05-21 (×5): qty 1

## 2020-05-21 MED ORDER — KETOROLAC TROMETHAMINE 10 MG PO TABS
10.0000 mg | ORAL_TABLET | Freq: Three times a day (TID) | ORAL | Status: DC | PRN
  Administered 2020-05-21 – 2020-05-22 (×3): 10 mg via ORAL
  Filled 2020-05-21 (×4): qty 1

## 2020-05-21 MED ORDER — ORAL CARE MOUTH RINSE
15.0000 mL | Freq: Two times a day (BID) | OROMUCOSAL | Status: DC
  Administered 2020-05-21 – 2020-05-25 (×5): 15 mL via OROMUCOSAL

## 2020-05-21 NOTE — Progress Notes (Signed)
Patient transported to 2C17 on BIPAP without complications.

## 2020-05-21 NOTE — Progress Notes (Signed)
Pt is transferred to [place] _0  from Bipap around 8 am this morning, tolerating well, oxygen sat is between 95-100. Pt's sister is visiting and updated, pt has a complain of back pain and toradol is helping a lot. IVABX continue, will continue to monitor the patient  Palma Holter, RN

## 2020-05-21 NOTE — Progress Notes (Signed)
ANTICOAGULATION CONSULT NOTE  Pharmacy Consult for warfarin Indication: hx VTE  No Known Allergies  Patient Measurements: Height: 5' 9" (175.3 cm) Weight: 99 kg (218 lb 4.1 oz) IBW/kg (Calculated) : 70.7  Vital Signs: Temp: 97.9 F (36.6 C) (05/29 0112) Temp Source: Oral (05/29 0112) BP: 116/83 (05/29 0400) Pulse Rate: 81 (05/29 0400)  Labs: Recent Labs    05/20/20 1229 05/20/20 1229 05/20/20 1338 05/20/20 1433 05/20/20 2109 05/20/20 2109 05/21/20 0030 05/21/20 0325  HGB 11.0*   < >  --   --  12.2*   < > 11.9* 10.7*  HCT 37.0*   < >  --   --  36.0*  --  35.0* 35.9*  PLT 459*  --   --   --   --   --   --  394  LABPROT  --   --   --  29.0*  --   --   --  30.9*  INR  --   --   --  2.8*  --   --   --  3.1*  CREATININE  --   --  0.82  --   --   --   --  0.72   < > = values in this interval not displayed.    Estimated Creatinine Clearance: 105.3 mL/min (by C-G formula based on SCr of 0.72 mg/dL).   Medical History: Past Medical History:  Diagnosis Date  . Acute on chronic respiratory failure with hypoxia ([place])   . COVID-19 virus infection   . DVT of lower extremity, bilateral ([place])   . Epistaxis   . Hypertension   . Incarcerated umbilical hernia 69/6789   Status post repair  . Lupus anticoagulant positive    Per records from [facility]  . 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 Pulmonary artery hypertension ([place]) 04/2011   Mildly elevated pulmonary artery pressure in setting of PE (from [facility])  . Pulmonary embolism associated with COVID-19 ([place])   . Subdural hematoma (HCC)    Remote episode, occurred when taking excedrin and warfarin.    Assessment: 81 yom presented to the ED with AMS and SOB. He is on chronic warfarin for history of VTE. INR slightly supratherapeutic at 3.1, CBC stable.  PTA regimen: 616m on Mon + Thurs, 444mall other days  Goal of Therapy:  INR 2-3 Monitor platelets  by anticoagulation protocol: Yes   Plan:  -Warfarin 16m16mO x1 tonight -Daily protime   MicArrie SenateharmD, BCPS Clinical Pharmacist [phone] check AMION for all [facility] 05/21/2020

## 2020-05-21 NOTE — Consult Note (Signed)
NAME:  CHRISTIE COPLEY, MRN:  [id number], DOB:  1951-11-12, LOS: 1 ADMISSION DATE:  05/20/2020, CONSULTATION DATE: 05/21/2020 REFERRING MD: Dr. Rebeca Alert, CHIEF COMPLAINT: Sarcoidosis  Brief History   Patient with a history of sarcoidosis was admitted with shortness of breath Breathing on hallucinations Significant back pain relating to recent kyphoplasty  History of present illness   He had a kyphoplasty 05/04/2020-has had pain since then, unable to do much secondary to pain History of stage IV sarcoidosis on steroids, is on supplemental oxygen Recent DVT and PE for which he is on anticoagulation Osteoporosis Had Covid January 2021 He had seen Dr. Shearon Stalls once last year, November 2020 and was supposed to follow-up Past Medical History   Past Medical History:  Diagnosis Date  . Acute on chronic respiratory failure with hypoxia ([place])   . COVID-19 virus infection   . DVT of lower extremity, bilateral ([place])   . Epistaxis   . Hypertension   . Incarcerated umbilical hernia 94/7654   Status post repair  . Lupus anticoagulant positive    Per records from [facility]  . 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 Pulmonary artery hypertension ([place]) 04/2011   Mildly elevated pulmonary artery pressure in setting of PE (from [facility])  . Pulmonary embolism associated with COVID-19 ([place])   . Subdural hematoma (HCC)    Remote episode, occurred when taking excedrin and warfarin.      Significant Hospital Events   Back discomfort Required BiPAP overnight Consults:  Pulmonary  Procedures:  None  Significant Diagnostic Tests:  CT head IMPRESSION: 1. No CT evidence for acute intracranial abnormality. 2. Prior left craniotomy  CT chest 2/21 IMPRESSION: 1. Study is positive for pulmonary embolism with a combination of occlusive and nonocclusive embolus in the lower lungs bilaterally ranging from segmental to subsegmental  sized vessels. 2. Aortic atherosclerosis, in addition to left main and 3 vessel coronary artery disease. Please note that although the presence of coronary artery calcium documents the presence of coronary artery disease, the severity of this disease and any potential stenosis cannot be assessed on this non-gated CT examination. Assessment for potential risk factor modification, dietary therapy or pharmacologic therapy may be warranted, if clinically indicated. 3. There are calcifications of the aortic valve. Echocardiographic correlation for evaluation of potential valvular dysfunction may be warranted if clinically indicated. 4. Imaging findings in the lungs compatible with reported clinical history of sarcoidosis, as above.  Micro Data:  5/29 MRSA PCR negative Urine culture 5/28 Blood cultures 5/28  Antimicrobials:  Vancomycin 5/28>> Cefepime 5/28>>  Interim history/subjective:  Middle-age gentleman, complain of pain and discomfort  Objective   Blood pressure 120/81, pulse 99, temperature (!) 97.5 F (36.4 C), temperature source Oral, resp. rate 20, height 5' 9" (1.753 m), weight 99 kg, SpO2 97 %.        Intake/Output Summary (Last 24 hours) at 05/21/2020 1152 Last data filed at 05/21/2020 1100 Gross per 24 hour  Intake 460 ml  Output 200 ml  Net 260 ml   Filed Weights   05/20/20 1209 05/21/20 0100  Weight: 97.5 kg 99 kg    Examination: General: Awake and alert, interactive, appropriate HENT: Moist oral mucosa Lungs: Decreased air movement bilaterally, does have some wheezing Cardiovascular: S1-S2 appreciated Abdomen: Bowel sounds appreciated  Most recent lab data reviewed Chest x-ray reviewed Most recent CT reviewed Echocardiogram reviewed  Resolved Hospital Problem list  Assessment & Plan:  Hypercapnic respiratory failure -Did well with BiPAP -Continue oxygen supplementation -Currently off BiPAP -CO2 retention may also have contributed to his  altered mental status  Back pain and discomfort -Lidoderm patch on -Added ketorolac  Sarcoidosis -Stage IV sarcoidosis -Unlikely to be contributing to ongoing process at present although means he has reduced reserves -Need to follow-up as outpatient -Continue prednisone at 20 mg  Obstructive lung disease with wheezing at present -Bronchodilators -Continue steroids  History of trauma embolic disease -Continue anticoagulation  Possible lower respiratory tract infection/pneumonia -Agree with short-term antibiotics -De-escalate if no ongoing signs of infection -Discontinue vancomycin  Cor pulmonale -Continue cautious diuretics  May use BiPAP at night Denies significant history of snoring, states he used to snore in the past May have underlying sleep disordered breathing that may need further work-up as outpatient  Will follow  Sherrilyn Rist, MD [place] [facility] Pager: [phone]

## 2020-05-21 NOTE — ED Notes (Signed)
Report given to rn on 2c

## 2020-05-21 NOTE — Progress Notes (Signed)
Subjective: Patient is off BiPAP this morning.On 4L [place]. Implies he is feeling pretty well this morning. No specific complaints. He reports that his breathing is about the same as it always is. He is more alert and oriented then when he came in.  We discussed that his labs from yesterday were abnormal and that we will be getting some imaging of his heart to further evaluate this.  Also be calling pulmonology to see if any adjustments to his home medications need to be made.  Patient was in agreement with this plan.  Desat to 75% with sitting up with gross expiratory wheezes.  Objective:  Vital signs in last 24 hours: Vitals:   05/21/20 0100 05/21/20 0112 05/21/20 0309 05/21/20 0400  BP:  (!) 127/96 127/89 116/83  Pulse:  79 89 81  Resp:  _0 Temp:  97.9 F (36.6 C)    TempSrc:  Oral    SpO2:  97%  99%  Weight: 99 kg     Height: _1  (1.753 m)      General: chronically ill appearing, in NAD Cardiac: RRR, no m/r/g Pulm: breathing comfortably on 4L [place]. Able to speak full sentences. Mildly labored breathing Neuro: a/o x3, moves all extremities  Assessment/Plan:  Principal Problem:   Respiratory failure with hypoxia and hypercapnia (HCC) Active Problems:   Pulmonary embolism on long-term anticoagulation therapy (HCC)   Sarcoidosis   History of DVT (deep vein thrombosis)   Hypertension   Osteoporosis   Right heart failure [facility])  This is a 69 year old male with a history of sarcoidosis, on 2L supplemental oxygen and chronic corticosteroids, hypertension, recent DVT and PE secondary to medication noncompliance February 2021, osteoporosis, recent history of COVID-19 in January 2021, and OSA presented with altered mental status, hallucinations, and dyspnea.  Acute encephalopathy: Hypoxic and hypercarbic respiratory failure: Acute encephalopathy was likely secondary to his hypercapnia, however the cause for his hypoxic and hypercarbic respiratory failure appears unknown.  He  was given antibiotics for possible infection, white count was elevated however is on chronic steroids at home.  Patient placed on BiPAP overnight, currently requesting to be taken off BiPAP.  ABG on BiPAP was 7.284/69.7/116/33.  BiPAP settings adjusted per pulmonary recommendations.  Patient was off BiPAP and placed on 4L with good saturations and we evaluated him. RVP was negative. Currently being diuresed for mild fluid overloaded.  Unclear if he meets sepsis criteria, white count is elevated however he is on chronic corticosteroids, no clear respiratory cause, no other specific symptoms of note.  Overall his hypercarbia is not totally consistent with pulmonary sarcoidosis no history of severe COPD or other causes of CO2 retention.  He does have a history of right heart failure however this should not cause the hypoxia that we are seeing.  We will continue to treat for possible pneumonia for now and continue BiPAP if needed.  Repeat ABG done this morning while patient was off BiPAP showed pH 7.385, PCO2 54, bicarb 31.  Relatively stable at this time.  -Pulm consult  -Continue vancomycin, day 2 -Continue cefepime, day 2 -Monitor CBC and BMP -Continue supplemental O2, wean as tolerated -BiPAP if needed  Right heart failure: Echo on 02/16/2020 demonstrated an LVEF of 50-55% but with a markedly dilated RV and severely reduced RV function.  Noted to have bilateral 2+ pitting  edema on exam today, he did receive 1 dose IV Lasix 40 mg yesterday.  Output of 200.  Still unclear if patient does  have sepsis, will hold off on further diuresis at this time and follow-up echocardiogram.  -Follow-up echocardiogram -Continue strict I's and O's and daily weights -Monitor vitals -If significant worsening on echocardiogram will consult cardiology  Stage IV sarcoidosis: Follows with Dr. Shearon Stalls for this, he was unable to complete the corticosteroid taper over the past few months, currently on prednisone 20 mg daily.   Did not follow-up in January, he was admitted in the hospital for Covid infection.  On chart review it appears that his disease has progressed with high probability of significant pulmonary fibrosis.  We will consult pulmonology for assistance with management of his sarcoidosis and prednisone recommendations. -Pulmonology consulted, appreciate recommendations -Continue prednisone 20 mg daily  History of PE and DVT: -Most recent one was in February 2021.  Currently on warfarin at home.  INR is therapeutic so PE is unlikely.  Continue warfarin per pharmacy.  Chronic back pn s/p kyphoplasty: -Lidocaine patch ordered.  Prior to Admission Living Arrangement: Home Anticipated Discharge Location: Home Barriers to Discharge: Continued medical management Dispo: Anticipated discharge in approximately 2-3 day(s).   Asencion Noble, MD 05/21/2020, 6:37 AM Pager: [phone]

## 2020-05-21 NOTE — Progress Notes (Signed)
Echocardiogram 2D Echocardiogram has been performed.  Jesus Russell 05/21/2020, 3:42 PM

## 2020-05-22 LAB — BASIC METABOLIC PANEL
Anion gap: 8 (ref 5–15)
BUN: 19 mg/dL (ref 8–23)
CO2: 31 mmol/L (ref 22–32)
Calcium: 8.7 mg/dL — ABNORMAL LOW (ref 8.9–10.3)
Chloride: 102 mmol/L (ref 98–111)
Creatinine, Ser: 0.8 mg/dL (ref 0.61–1.24)
GFR calc Af Amer: >60 mL/min (ref >60)
GFR calc non Af Amer: >60 mL/min (ref >60)
Glucose, Bld: 106 mg/dL — ABNORMAL HIGH (ref 70–99)
Potassium: 3.7 mmol/L (ref 3.5–5.1)
Sodium: 141 mmol/L (ref 135–145)

## 2020-05-22 LAB — CBC
HCT: 35.7 % — ABNORMAL LOW (ref 39.0–52.0)
Hemoglobin: 10.3 g/dL — ABNORMAL LOW (ref 13.0–17.0)
MCH: 28.3 pg (ref 26.0–34.0)
MCHC: 28.9 g/dL — ABNORMAL LOW (ref 30.0–36.0)
MCV: 98.1 fL (ref 80.0–100.0)
Platelets: 356 10*3/uL (ref 150–400)
RBC: 3.64 MIL/uL — ABNORMAL LOW (ref 4.22–5.81)
RDW: 17.1 % — ABNORMAL HIGH (ref 11.5–15.5)
WBC: 12.8 10*3/uL — ABNORMAL HIGH (ref 4.0–10.5)
nRBC: 0.2 % (ref 0.0–0.2)

## 2020-05-22 LAB — URINE CULTURE: Culture: 20000 — AB

## 2020-05-22 LAB — PROTIME-INR
INR: 3 — ABNORMAL HIGH (ref 0.8–1.2)
Prothrombin Time: 30.1 seconds — ABNORMAL HIGH (ref 11.4–15.2)

## 2020-05-22 MED ORDER — OXYCODONE-ACETAMINOPHEN 5-325 MG PO TABS
1.0000 | ORAL_TABLET | ORAL | Status: DC | PRN
  Administered 2020-05-22 – 2020-05-24 (×6): 1 via ORAL
  Filled 2020-05-22 (×6): qty 1

## 2020-05-22 MED ORDER — WARFARIN SODIUM 4 MG PO TABS
4.0000 mg | ORAL_TABLET | Freq: Once | ORAL | Status: AC
  Administered 2020-05-22: 4 mg via ORAL
  Filled 2020-05-22: qty 1

## 2020-05-22 MED ORDER — FUROSEMIDE 10 MG/ML IJ SOLN
40.0000 mg | Freq: Once | INTRAMUSCULAR | Status: AC
  Administered 2020-05-22: 40 mg via INTRAVENOUS
  Filled 2020-05-22: qty 4

## 2020-05-22 MED ORDER — ALBUTEROL SULFATE (2.5 MG/3ML) 0.083% IN NEBU
2.5000 mg | INHALATION_SOLUTION | RESPIRATORY_TRACT | Status: DC | PRN
  Administered 2020-05-23: 2.5 mg via RESPIRATORY_TRACT
  Filled 2020-05-22: qty 3

## 2020-05-22 MED ORDER — OXYCODONE-ACETAMINOPHEN 5-325 MG PO TABS: 1.0000 | ORAL_TABLET | ORAL | Status: DC | PRN

## 2020-05-22 MED ORDER — KETOROLAC TROMETHAMINE 10 MG PO TABS
10.0000 mg | ORAL_TABLET | Freq: Three times a day (TID) | ORAL | Status: DC
  Administered 2020-05-22 – 2020-05-25 (×10): 10 mg via ORAL
  Filled 2020-05-22 (×12): qty 1

## 2020-05-22 NOTE — Progress Notes (Signed)
ANTICOAGULATION CONSULT NOTE  Pharmacy Consult for warfarin Indication: hx VTE  No Known Allergies  Patient Measurements: Height: 5' 9" (175.3 cm) Weight: 97.3 kg (214 lb 8.1 oz) IBW/kg (Calculated) : 70.7  Vital Signs: Temp: 97.5 F (36.4 C) (05/30 0348) Temp Source: Oral (05/30 0348) BP: 125/71 (05/30 0348) Pulse Rate: 75 (05/30 0348)  Labs: Recent Labs    05/20/20 1229 05/20/20 1229 05/20/20 1338 05/20/20 1433 05/20/20 2109 05/20/20 2109 05/21/20 0030 05/21/20 0325 05/22/20 0214  HGB 11.0*   < >  --   --  12.2*   < > 11.9* 10.7*  --   HCT 37.0*   < >  --   --  36.0*  --  35.0* 35.9*  --   PLT 459*  --   --   --   --   --   --  394  --   LABPROT  --   --   --  29.0*  --   --   --  30.9* 30.1*  INR  --   --   --  2.8*  --   --   --  3.1* 3.0*  CREATININE  --   --  0.82  --   --   --   --  0.72  --    < > = values in this interval not displayed.    Estimated Creatinine Clearance: 104.4 mL/min (by C-G formula based on SCr of 0.72 mg/dL).   Medical History: Past Medical History:  Diagnosis Date  . Acute on chronic respiratory failure with hypoxia ([place])   . COVID-19 virus infection   . DVT of lower extremity, bilateral ([place])   . Epistaxis   . Hypertension   . Incarcerated umbilical hernia 12/7492   Status post repair  . Lupus anticoagulant positive    Per records from [facility]  . 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 Pulmonary artery hypertension ([place]) 04/2011   Mildly elevated pulmonary artery pressure in setting of PE (from [facility])  . Pulmonary embolism associated with COVID-19 ([place])   . Subdural hematoma (HCC)    Remote episode, occurred when taking excedrin and warfarin.    Assessment: 74 yom presented to the ED with AMS and SOB. He is on chronic warfarin for history of VTE. INR therapeutic at 3, CBC stable.  PTA regimen: 63m on Mon + Thurs, 457mall other days  Goal of  Therapy:  INR 2-3 Monitor platelets by anticoagulation protocol: Yes   Plan:  -Warfarin 69m61mO x1 tonight -Daily protime   MicArrie SenateharmD, BCPS Clinical Pharmacist [phone] check AMION for all [facility] 05/22/2020

## 2020-05-22 NOTE — Progress Notes (Signed)
Pt is stable, vitals stable but pt is screaming each time on moving his position, paged and informed MD regarding the need of extra dose of pain med for breakthrough pain, pt pretty much sitting in a recliner, other than back pain no any complain of SOB, oxygen _0  via [place], saturation is between 95-100%, will continue to monitor the patient  Palma Holter, RN

## 2020-05-22 NOTE — Progress Notes (Signed)
Walked in on patient sleeping and witnessed episodes of sleep apnea. I encouraged the patient to wear NIV tonight but patient adamantly declines to wear.

## 2020-05-22 NOTE — Progress Notes (Signed)
Subjective: Jesus Russell is doing ok this AM. He reports some continued SOB worse than baseline. He reports his SOB had worsened after starting O2. He reports that this has happened before as well. His biggest complaint is his upper back pain. It is hard for him to get comfortable. Pain worse with movement. Hasn't yet received his dose of toradol.  Update: nurse states patient's pain is improved after dose of toradol  Objective:  Vital signs in last 24 hours: Vitals:   05/22/20 0348 05/22/20 0631 05/22/20 0728 05/22/20 0841  BP: 125/71  114/87   Pulse: 75  95   Resp: 15  20   Temp: (!) 97.5 F (36.4 C)  (!) 97.5 F (36.4 C)   TempSrc: Oral  Oral   SpO2: 100%  98% (!) 88%  Weight:  97.3 kg    Height:       General: chronically ill appearing, in NAD Cardiac: RRR, no m/r/g Pulm: breathing comfortably on 4L [place]. Able to speak full sentences. Mildly labored breathing. Breathing comfortably off oxygen as well. Neuro: a/o x3, moves all extremities   Intake/Output Summary (Last 24 hours) at 05/22/2020 1052 Last data filed at 05/22/2020 0800 Gross per 24 hour  Intake 1423.33 ml  Output 1575 ml  Net -151.67 ml   Results for orders placed or performed during the hospital encounter of 05/20/20 (from the past 24 hour(s))  Protime-INR     Status: Abnormal   Collection Time: 05/22/20  2:14 AM  Result Value Ref Range   Prothrombin Time 30.1 (H) 11.4 - 15.2 seconds   INR 3.0 (H) 0.8 - 1.2  CBC     Status: Abnormal   Collection Time: 05/22/20  8:03 AM  Result Value Ref Range   WBC 12.8 (H) 4.0 - 10.5 K/uL   RBC 3.64 (L) 4.22 - 5.81 MIL/uL   Hemoglobin 10.3 (L) 13.0 - 17.0 g/dL   HCT 35.7 (L) 39.0 - 52.0 %   MCV 98.1 80.0 - 100.0 fL   MCH 28.3 26.0 - 34.0 pg   MCHC 28.9 (L) 30.0 - 36.0 g/dL   RDW 17.1 (H) 11.5 - 15.5 %   Platelets 356 150 - 400 K/uL   nRBC 0.2 0.0 - 0.2 %  Basic metabolic panel     Status: Abnormal   Collection Time: 05/22/20  8:03 AM  Result Value Ref Range   Sodium 141 135 - 145 mmol/L   Potassium 3.7 3.5 - 5.1 mmol/L   Chloride 102 98 - 111 mmol/L   CO2 31 22 - 32 mmol/L   Glucose, Bld 106 (H) 70 - 99 mg/dL   BUN 19 8 - 23 mg/dL   Creatinine, Ser 0.80 0.61 - 1.24 mg/dL   Calcium 8.7 (L) 8.9 - 10.3 mg/dL   GFR calc non Af Amer >60 >60 mL/min   GFR calc Af Amer >60 >60 mL/min   Anion gap 8 5 - 15   ECHOCARDIOGRAM COMPLETE  Result Date: 05/21/2020    ECHOCARDIOGRAM REPORT   Patient Name:   Jesus Russell Date of Exam: 05/21/2020 Medical Rec #:  [id number]      Height:       69.0 in Accession #:    [id number]     Weight:       218.3 lb Date of Birth:  [birth year]      BSA:          2.144 m Patient Age:    69 years  BP:           124/87 mmHg Patient Gender: M              HR:           94 bpm. Exam Location:  Inpatient Procedure: 2D Echo, Color Doppler and Cardiac Doppler Indications:    Acute Respiratory Insufficiency R06.89  History:        Patient has prior history of Echocardiogram examinations, most                 recent 02/16/2020. Pulmonary HTN; Risk Factors:Hypertension,                 Sleep Apnea and Lupus. COVID+ 01/06/20.  Sonographer:    Raquel Sarna Senior RDCS Referring Phys: [id number] Raynaldo Opitz RAINES IMPRESSIONS  1. Left ventricular ejection fraction, by estimation, is 55 to 60%. The left ventricle has normal function. The left ventricle has no regional wall motion abnormalities. Left ventricular diastolic parameters are consistent with Grade I diastolic dysfunction (impaired relaxation). There is the interventricular septum is flattened in systole and diastole, consistent with right ventricular pressure and volume overload.  2. Right ventricular systolic function is severely reduced. The right ventricular size is severely enlarged. Tricuspid regurgitation signal is inadequate for assessing PA pressure.  3. The mitral valve is grossly normal. No evidence of mitral valve regurgitation. No evidence of mitral stenosis.  4. The aortic valve is  tricuspid. Aortic valve regurgitation is not visualized. Mild to moderate aortic valve sclerosis/calcification is present, without any evidence of aortic stenosis.  5. The inferior vena cava is dilated in size with <50% respiratory variability, suggesting right atrial pressure of 15 mmHg. Comparison(s): No significant change from prior study. Conclusion(s)/Recommendation(s): Findings consistent with Cor Pulmonale. FINDINGS  Left Ventricle: Left ventricular ejection fraction, by estimation, is 55 to 60%. The left ventricle has normal function. The left ventricle has no regional wall motion abnormalities. The left ventricular internal cavity size was normal in size. There is  no left ventricular hypertrophy. The interventricular septum is flattened in systole and diastole, consistent with right ventricular pressure and volume overload. Left ventricular diastolic parameters are consistent with Grade I diastolic dysfunction (impaired relaxation). Normal left ventricular filling pressure. Right Ventricle: The right ventricular size is severely enlarged. No increase in right ventricular wall thickness. Right ventricular systolic function is severely reduced. Tricuspid regurgitation signal is inadequate for assessing PA pressure. Left Atrium: Left atrial size was normal in size. Right Atrium: Right atrial size was normal in size. Pericardium: Trivial pericardial effusion is present. Presence of pericardial fat pad. Mitral Valve: The mitral valve is grossly normal. No evidence of mitral valve regurgitation. No evidence of mitral valve stenosis. Tricuspid Valve: The tricuspid valve is grossly normal. Tricuspid valve regurgitation is trivial. No evidence of tricuspid stenosis. Aortic Valve: The aortic valve is tricuspid. Aortic valve regurgitation is not visualized. Mild to moderate aortic valve sclerosis/calcification is present, without any evidence of aortic stenosis. Pulmonic Valve: The pulmonic valve was grossly normal.  Pulmonic valve regurgitation is not visualized. No evidence of pulmonic stenosis. Aorta: The aortic root and ascending aorta are structurally normal, with no evidence of dilitation. Venous: The inferior vena cava is dilated in size with less than 50% respiratory variability, suggesting right atrial pressure of 15 mmHg. IAS/Shunts: The atrial septum is grossly normal.  LEFT VENTRICLE PLAX 2D LVIDd:         3.30 cm  Diastology LVIDs:  2.20 cm  LV e' lateral:   9.79 cm/s LV PW:         1.00 cm  LV E/e' lateral: 5.3 LV IVS:        1.10 cm  LV e' medial:    4.03 cm/s LVOT diam:     2.40 cm  LV E/e' medial:  12.9 LV SV:         74 LV SV Index:   34 LVOT Area:     4.52 cm  RIGHT VENTRICLE RV S prime:     9.46 cm/s TAPSE (M-mode): 1.3 cm LEFT ATRIUM             Index       RIGHT ATRIUM           Index LA diam:        2.20 cm 1.03 cm/m  RA Area:     18.60 cm LA Vol (A2C):   47.1 ml 21.97 ml/m RA Volume:   57.20 ml  26.68 ml/m LA Vol (A4C):   46.5 ml 21.69 ml/m LA Biplane Vol: 50.8 ml 23.69 ml/m  AORTIC VALVE LVOT Vmax:   90.75 cm/s LVOT Vmean:  65.050 cm/s LVOT VTI:    0.163 m  AORTA Ao Root diam: 3.50 cm Ao Asc diam:  3.60 cm MITRAL VALVE MV Area (PHT): 3.77 cm    SHUNTS MV Decel Time: 201 msec    Systemic VTI:  0.16 m MV E velocity: 51.80 cm/s  Systemic Diam: 2.40 cm MV A velocity: 93.00 cm/s MV E/A ratio:  0.56 Eleonore Chiquito MD Electronically signed by Eleonore Chiquito MD Signature Date/Time: 05/21/2020/4:20:35 PM    Final     Assessment/Plan:  Principal Problem:   Respiratory failure with hypoxia and hypercapnia (HCC) Active Problems:   Pulmonary embolism on long-term anticoagulation therapy ([place])   Sarcoidosis   History of DVT (deep vein thrombosis)   Hypertension   Osteoporosis   Right heart failure [facility])  This is a 69 year old male with a history of sarcoidosis, on 2L supplemental oxygen and chronic corticosteroids, hypertension, recent DVT and PE secondary to medication noncompliance  February 2021, osteoporosis, recent history of COVID-19 in January 2021, and OSA who presented with altered mental status, hallucinations, and dyspnea.  Acute encephalopathy: Hypoxic and hypercarbic respiratory failure: Acute encephalopathy was likely secondary to his hypercapnia, however the cause for his hypoxic and hypercarbic respiratory failure appears unknown.  He was given antibiotics for possible infection, white count was elevated however is on chronic steroids at home. RVP was negative. Currently being diuresed for mild fluid overloaded.  Unclear if he meets sepsis criteria, white count is elevated however he is on chronic corticosteroids, no clear respiratory cause, no other specific symptoms of note. Overall his hypercarbia is not totally consistent with pulmonary sarcoidosis amnd he has no history of severe COPD or other causes of CO2 retention.  He does have a history of right heart failure however this should not cause the hypoxia that we are seeing.  We will continue to treat for possible pneumonia for now and continue BiPAP if needed.  Repeat ABG done yesterday morning while patient was off BiPAP showed pH 7.385, PCO2 54, bicarb 31.  Relatively stable at this time.  -Pulm consulted yesterday and recommends continued oxygen supplementation, continued bronchodilators, continued steroids; agrees with short-term antibiotics and gentle diuresis; believes patient could have underlying sleep disorder and recommends pulm follow up outpatient; recommended dc'ing vanc    -Continue cefepime, day 3 -Monitor  CBC and BMP -Continue supplemental O2, wean as tolerated -BiPAP if needed -continue bronchodilators and steroids -gentle diuresis w/ lasix 40 IV today  Right heart failure: Echo on 02/16/2020 demonstrated an LVEF of 50-55% but with a markedly dilated RV and severely reduced RV function.  Noted to have bilateral 2+ pitting  edema on exam yesterday, he did receive 1 dose IV Lasix 40 mg the day  prior.  Output of 200.  Echo demonstrates elevated RV pressure and volume overload as well as severely reduced right ventricular systolic function consistent with cor pulmonale   -Continue strict I's and O's and daily weights -Monitor vitals -gentle IV diuresis w/ lasix 40 IV today  Stage IV sarcoidosis: Follows with Dr. Shearon Stalls for this, he was unable to complete the corticosteroid taper over the past few months, currently on prednisone 20 mg daily.  Did not follow-up in January as he was admitted in the hospital for Covid infection.  On chart review, it appears that his disease has progressed, with high probability of significant pulmonary fibrosis.  We will consult pulmonology for assistance with management of his sarcoidosis and prednisone recommendations. -Pulm consulted yesterday and recommends oxygen supplementation, bronchodilators, steroids; does not believe sarcoid contributing to this acute process  History of PE and DVT: -Most recent one was in February 2021.  Currently on warfarin at home.  INR is therapeutic so PE is unlikely.  Continue warfarin per pharmacy.  Chronic back pn s/p kyphoplasty: Patient complaining of significant back pain today which he says interferes with his breathing; pain improved w/ toradol -Lidocaine patch ordered. -s/p IM ketorolac -continue oral toradol prn  Prior to Admission Living Arrangement: Home Anticipated Discharge Location: Home Barriers to Discharge: Continued medical management Dispo: Anticipated discharge in approximately 2-3 day(s).   Al Decant, MD 05/22/2020, 10:52 AM Pager: [phone]

## 2020-05-22 NOTE — Progress Notes (Addendum)
Patient up in chair assisted x 1 to bed. Resp increased. Somewhat labored breathing. Recovery took approximately 5-8 mins. On room air patient sats dropped to 86-88% on activity. 02 applied at 2L, sats came up to 94-98%. Patient's work of breathing increases with activity.  Activity tolerated poorly.  Abdominal breathing noted. Will continue to observe. Frequent rest breaks encouraged.  0630- Patient given breathing treatment due to patient wheezing and labored breathing after standing on weighing scale and being repositioned in the bed. Sats dropped to 86%, came up to 90%, 98% after breathing treatment. HR-90 at this time. Patient states that he feels better after treatment. Respiratory called and made aware to monitor patient further for needed breathing treatments.

## 2020-05-23 DIAGNOSIS — J9622 Acute and chronic respiratory failure with hypercapnia: Secondary | ICD-10-CM

## 2020-05-23 DIAGNOSIS — J9621 Acute and chronic respiratory failure with hypoxia: Principal | ICD-10-CM

## 2020-05-23 LAB — PROTIME-INR
INR: 2.5 — ABNORMAL HIGH (ref 0.8–1.2)
Prothrombin Time: 26.2 seconds — ABNORMAL HIGH (ref 11.4–15.2)

## 2020-05-23 MED ORDER — CEFDINIR 300 MG PO CAPS: 300.0000 mg | ORAL_CAPSULE | Freq: Two times a day (BID) | ORAL | Status: DC

## 2020-05-23 MED ORDER — WARFARIN SODIUM 3 MG PO TABS
6.0000 mg | ORAL_TABLET | Freq: Once | ORAL | Status: AC
  Administered 2020-05-23: 6 mg via ORAL
  Filled 2020-05-23: qty 2

## 2020-05-23 MED ORDER — AMOXICILLIN-POT CLAVULANATE 875-125 MG PO TABS
1.0000 | ORAL_TABLET | Freq: Two times a day (BID) | ORAL | Status: AC
  Administered 2020-05-23 – 2020-05-24 (×3): 1 via ORAL
  Filled 2020-05-23 (×3): qty 1

## 2020-05-23 MED ORDER — OXYCODONE-ACETAMINOPHEN 5-325 MG PO TABS: 1.0000 | ORAL_TABLET | Freq: Three times a day (TID) | ORAL | 0 refills | Status: DC | PRN

## 2020-05-23 MED ORDER — CEFDINIR 300 MG PO CAPS
300.0000 mg | ORAL_CAPSULE | Freq: Two times a day (BID) | ORAL | 0 refills | Status: DC
Start: 2020-05-24 — End: 2020-05-24

## 2020-05-23 MED ORDER — KETOROLAC TROMETHAMINE 10 MG PO TABS: 10.0000 mg | ORAL_TABLET | Freq: Three times a day (TID) | ORAL | 0 refills | Status: DC

## 2020-05-23 MED ORDER — LIDOCAINE 5 % EX PTCH: 1.0000 | MEDICATED_PATCH | CUTANEOUS | 0 refills | Status: DC

## 2020-05-23 NOTE — Progress Notes (Signed)
ANTICOAGULATION CONSULT NOTE  Pharmacy Consult for warfarin Indication: hx VTE  No Known Allergies  Patient Measurements: Height: 5' 9" (175.3 cm) Weight: 98.1 kg (216 lb 4.3 oz) IBW/kg (Calculated) : 70.7  Vital Signs: Temp: 97.9 F (36.6 C) (05/31 0416) Temp Source: Oral (05/31 0416) BP: 124/97 (05/31 0416) Pulse Rate: 98 (05/31 0505)  Labs: Recent Labs    05/20/20 1229 05/20/20 1338 05/20/20 1433 05/21/20 0030 05/21/20 0325 05/22/20 0214 05/22/20 0803 05/23/20 0235  HGB 11.0*  --    < > 11.9* 10.7*  --  10.3*  --   HCT 37.0*  --    < > 35.0* 35.9*  --  35.7*  --   PLT 459*  --   --   --  394  --  356  --   LABPROT  --   --    < >  --  30.9* 30.1*  --  26.2*  INR  --   --    < >  --  3.1* 3.0*  --  2.5*  CREATININE  --  0.82  --   --  0.72  --  0.80  --    < > = values in this interval not displayed.    Estimated Creatinine Clearance: 105 mL/min (by C-G formula based on SCr of 0.8 mg/dL).   Medical History: Past Medical History:  Diagnosis Date  . Acute on chronic respiratory failure with hypoxia ([place])   . COVID-19 virus infection   . DVT of lower extremity, bilateral ([place])   . Epistaxis   . Hypertension   . Incarcerated umbilical hernia 70/6237   Status post repair  . Lupus anticoagulant positive    Per records from [facility]  . 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 Pulmonary embolism associated with COVID-19 ([place])   . Subdural hematoma (HCC)    Remote episode, occurred when taking excedrin and warfarin.    Assessment: 61 yom presented to the ED with AMS and SOB. He is on chronic warfarin for history of VTE. INR therapeutic at 2.5.  PTA regimen: 629m on Mon + Thurs, 43mall other days  Goal of Therapy:  INR 2-3 Monitor platelets by anticoagulation  protocol: Yes   Plan:  -Warfarin 29m429mO x1 tonight -Daily protime   MicArrie SenateharmD, BCPS Clinical Pharmacist [phone] check AMION for all [facility] 05/23/2020

## 2020-05-23 NOTE — Progress Notes (Signed)
Pharmacy Antibiotic Note  Jesus Russell is a 69 y.o. male admitted on 05/20/2020 with sepsis.  Pharmacy has been consulted for vancomycin dosing and cefepime. Vancomycin discontinued with negative MRSA PCR, renal function stable, WBC coming down.  Plan: Continue cefepime 2g IV q8h Follow renal function, LOT, cultures   Height: _0  (175.3 cm) Weight: 98.1 kg (216 lb 4.3 oz) IBW/kg (Calculated) : 70.7  Temp (24hrs), Avg:97.6 F (36.4 C), Min:97.4 F (36.3 C), Max:97.9 F (36.6 C)  Recent Labs  Lab 05/20/20 1228 05/20/20 1229 05/20/20 1338 05/21/20 0325 05/22/20 0803  WBC  --  18.4*  --  14.2* 12.8*  CREATININE  --   --  0.82 0.72 0.80  LATICACIDVEN 1.2  --   --   --   --     Estimated Creatinine Clearance: 105 mL/min (by C-G formula based on SCr of 0.8 mg/dL).    No Known Allergies  Antimicrobials this admission: Vancomycin 5/28 >> 5/29 Cefepime 5/28 >>   Microbiology results: 5/28 Resp panel: negative 5/28 BCx: NGTD 5/28 UCx: 20k Enterococcus 5/29 MRSA PCR: negative   Arrie Senate, PharmD, BCPS Clinical Pharmacist [phone] Please check AMION for all [place] numbers 05/23/2020

## 2020-05-23 NOTE — Consult Note (Deleted)
NAME:  Jesus Russell, MRN:  [id number], DOB:  05-04-1951, LOS: 3 ADMISSION DATE:  05/20/2020, CONSULTATION DATE: 05/21/2020 REFERRING MD: Dr. Rebeca Alert, CHIEF COMPLAINT: Sarcoidosis  Brief History   Patient with a history of sarcoidosis was admitted with shortness of breath ? 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 Shearon Stalls once last year, November 2020 and was supposed to follow-up June 1    Past Medical History   Past Medical History:  Diagnosis Date  . Acute on chronic respiratory failure with hypoxia ([place])   . COVID-19 virus infection   . DVT of lower extremity, bilateral ([place])   . Epistaxis   . Hypertension   . Incarcerated umbilical hernia [id number]/9811   Status post repair  . Lupus anticoagulant positive    Per records from [facility]  . 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 Pulmonary artery hypertension ([place]) 04/2011   Mildly elevated pulmonary artery pressure in setting of PE (from [facility])  . Pulmonary embolism associated with COVID-19 ([place])   . Subdural hematoma ([facility])    Remote episode, occurred when taking excedrin and warfarin.      Significant Hospital Events   Back discomfort Required BiPAP overnight  Consults:  Pulmonary  Procedures:  None  Significant Diagnostic Tests:  CT head IMPRESSION: 1. No CT evidence for acute intracranial abnormality. 2. Prior left craniotomy  CTa chest 02/14/20 IMPRESSION: 1. Study is positive for pulmonary embolism with a combination of occlusive and nonocclusive embolus in the lower lungs  bilaterally ranging from segmental to subsegmental sized vessels. 2. Aortic atherosclerosis, in addition to left main and 3 vessel coronary artery disease. Please note that although the presence of coronary artery calcium documents the presence of coronary artery disease, the severity of this disease and any potential stenosis cannot be assessed on this non-gated CT examination. Assessment for potential risk factor modification, dietary therapy or pharmacologic therapy may be warranted, if clinically indicated. 3. There are calcifications of the aortic valve. Echocardiographic correlation for evaluation of potential valvular dysfunction may be warranted if clinically indicated. 4. Imaging findings in the lungs compatible with reported clinical history of sarcoidosis, as above. Echo 05/21/20  Severe RV dysfunction   Micro Data:  5/29 MRSA PCR negative Urine culture 5/28> 20 k ecoli Blood cultures 5/28 > NGTD    Scheduled Meds: . ascorbic acid  500 mg Oral Daily  . chlorhexidine  15 mL Mouth Rinse BID  . cholecalciferol  1,000 Units Oral Daily  . famotidine  20 mg Oral BID  . fluticasone furoate-vilanterol  1 puff Inhalation Daily  . ketorolac  10 mg Oral Q8H  . lidocaine  1 patch Transdermal Q24H  . mouth rinse  15 mL Mouth Rinse q12n4p  . predniSONE  20 mg Oral Q breakfast  . warfarin  6 mg Oral ONCE-1600  . Warfarin - Pharmacist Dosing Inpatient   Does not apply q1600  . zinc sulfate  220 mg Oral Daily   Continuous Infusions: . ceFEPime (MAXIPIME) IV 2 g (05/23/20 0522)   PRN Meds:.acetaminophen **OR** acetaminophen, albuterol,  oxyCODONE-acetaminophen  Interim history/subjective:  Middle aged bm sitting in chair awaiting d/c instructions  Objective   Blood pressure (!) 116/92, pulse (!) 112, temperature 98 F (36.7 C), temperature source Oral, resp. rate 20, height _0  (1.753 m), weight 98.1 kg, SpO2 95 %.        Intake/Output Summary (Last 24 hours) at 05/23/2020  1425 Last data filed at 05/23/2020 0745 Gross per 24 hour  Intake 920 ml  Output 800 ml  Net 120 ml   Filed Weights   05/21/20 0100 05/22/20 0631 05/23/20 0622  Weight: 99 kg 97.3 kg 98.1 kg    Examination: Pt alert, approp nad @ 90 degrees sitting upright on 3lpm sats 95%  No jvd Oropharynx clear,  mucosa nl Neck supple Lungs with minimally coarsened bs bilaterally  RRR no s3 or or sign murmur Abd soft  Extr warm with  1+ pitting LE edema sym bilaterally  Neuro  Sensorium intact,  no apparent motor deficits     Resolved Hospital Problem list     Assessment & Plan:  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  - well compensated off bipap and on nasal 02  so ok for discharge but should start for now by wearing  it 24/7 for now - 2lpm is probably ok as don't want to drive sats > 63% in this setting      Sarcoidosis -Stage IV sarcoidosis -Unlikely to be contributing to ongoing process at present although means he has reduced reserves  >>  Continue prednisone at 20 mg/day until pulmonary f/u as planned   Obstructive lung disease with wheezing resolved  -Maint Breo/ prn saba  -Continue steroids as abov e  History of thromboembolic disease -Continue anticoagulation     Cor pulmonale -Continue cautious diuretics and adequate 02 rx as above   Christinia Gully, MD Pulmonary and [place] [facility] [phone] After 6:00 PM or weekends, use Beeper [phone]  After 7:00 pm call Elink  [phone]

## 2020-05-23 NOTE — Progress Notes (Signed)
NAME:  Jesus Russell, MRN:  [id number], DOB:  [birth year], LOS: 3 ADMISSION DATE:  05/20/2020, CONSULTATION DATE: 05/21/2020 REFERRING MD: Dr. Rebeca Alert, CHIEF COMPLAINT: Sarcoidosis  Brief History   Patient with a history of sarcoidosis was admitted with shortness of breath ? 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 Shearon Stalls once last year, November 2020 and was supposed to follow-up June 1    Past Medical History   Past Medical History:  Diagnosis Date  . Acute on chronic respiratory failure with hypoxia ([place])   . COVID-19 virus infection   . DVT of lower extremity, bilateral ([place])   . Epistaxis   . Hypertension   . Incarcerated umbilical hernia 64/4034   Status post repair  . Lupus anticoagulant positive    Per records from [facility]  . 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 Pulmonary artery hypertension ([place]) 04/2011   Mildly elevated pulmonary artery pressure in setting of PE (from [facility])  . Pulmonary embolism associated with COVID-19 ([place])   . Subdural hematoma ([facility])    Remote episode, occurred when taking excedrin and warfarin.      Significant Hospital Events   Back discomfort Required BiPAP overnight  Consults:  Pulmonary  Procedures:  None  Significant Diagnostic Tests:  CT head IMPRESSION: 1. No CT evidence for acute intracranial abnormality. 2. Prior left craniotomy  CTa chest 02/14/20 IMPRESSION: 1. Study is positive for pulmonary embolism with a combination of occlusive and nonocclusive embolus in the lower lungs  bilaterally ranging from segmental to subsegmental sized vessels. 2. Aortic atherosclerosis, in addition to left main and 3 vessel coronary artery disease. Please note that although the presence of coronary artery calcium documents the presence of coronary artery disease, the severity of this disease and any potential stenosis cannot be assessed on this non-gated CT examination. Assessment for potential risk factor modification, dietary therapy or pharmacologic therapy may be warranted, if clinically indicated. 3. There are calcifications of the aortic valve. Echocardiographic correlation for evaluation of potential valvular dysfunction may be warranted if clinically indicated. 4. Imaging findings in the lungs compatible with reported clinical history of sarcoidosis, as above. Echo 05/21/20  Severe RV dysfunction   Micro Data:  5/29 MRSA PCR negative Urine culture 5/28> 20 k ecoli Blood cultures 5/28 > NGTD    Scheduled Meds: . ascorbic acid  500 mg Oral Daily  . chlorhexidine  15 mL Mouth Rinse BID  . cholecalciferol  1,000 Units Oral Daily  . famotidine  20 mg Oral BID  . fluticasone furoate-vilanterol  1 puff Inhalation Daily  . ketorolac  10 mg Oral Q8H  . lidocaine  1 patch Transdermal Q24H  . mouth rinse  15 mL Mouth Rinse q12n4p  . predniSONE  20 mg Oral Q breakfast  . warfarin  6 mg Oral ONCE-1600  . Warfarin - Pharmacist Dosing Inpatient   Does not apply q1600  . zinc sulfate  220 mg Oral Daily   Continuous Infusions: . ceFEPime (MAXIPIME) IV Stopped (05/23/20 1527)   PRN Meds:.acetaminophen **OR** acetaminophen, albuterol, oxyCODONE-acetaminophen  Interim history/subjective:  Middle aged bm sitting in chair awaiting d/c instructions  Objective   Blood pressure (!) 132/92, pulse (!) 101, temperature 98 F (36.7 C), temperature source Oral, resp. rate 20, height _0  (1.753 m), weight 98.1 kg, SpO2 95 %.        Intake/Output Summary (Last 24 hours) at  05/23/2020 1534 Last data filed at 05/23/2020 0745 Gross per 24 hour  Intake 920 ml  Output 800 ml  Net 120 ml   Filed Weights   05/21/20 0100 05/22/20 0631 05/23/20 0622  Weight: 99 kg 97.3 kg 98.1 kg    Examination: Pt alert, approp nad @ 90 degrees sitting upright on 3lpm sats 95%  No jvd Oropharynx clear,  mucosa nl Neck supple Lungs with minimally coarsened bs bilaterally  RRR no s3 or or sign murmur Abd soft  Extr warm with  1+ pitting LE edema sym bilaterally  Neuro  Sensorium intact,  no apparent motor deficits     Resolved Hospital Problem list     Assessment & Plan:  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  - well compensated off bipap and on nasal 02  so ok for discharge but should start for now by wearing  it 24/7 for now - 2lpm is probably ok as don't want to drive sats > 15% in this setting      Sarcoidosis -Stage IV sarcoidosis -Unlikely to be contributing to ongoing process at present although means he has reduced reserves  >>  Continue prednisone at 20 mg/day until pulmonary f/u as planned   Obstructive lung disease with wheezing resolved  -Maint Breo/ prn saba  -Continue steroids as abov e  History of thromboembolic disease -Continue anticoagulation     Cor pulmonale -Continue cautious diuretics and adequate 02 rx as above   Christinia Gully, MD Pulmonary and [place] [facility] [phone] After 6:00 PM or weekends, use Beeper [phone]  After 7:00 pm call Elink  [phone]

## 2020-05-23 NOTE — Discharge Summary (Signed)
Name: Jesus Russell MRN: [id number] DOB: [birth year] 69 y.o. PCP: Jeanmarie Hubert, MD  Date of Admission: 05/20/2020 11:48 AM Date of Discharge:  05/25/2020 Attending Physician: No att. providers found  Discharge Diagnosis:  1. Acute encephalopathy 2. Hypoxic and hypercarbic respiratory failure 3. 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 History of PE/DVT 6. Chronic back pain s/p kyphoplasty    Discharge Medications: Allergies as of 05/25/2020   No Known Allergies     Medication List    STOP taking these medications   enoxaparin 150 MG/ML injection Commonly known as: Lovenox   hydrochlorothiazide 25 MG tablet Commonly known as: HYDRODIURIL     TAKE these medications   alendronate 70 MG tablet Commonly known as: Fosamax Take 1 tablet (70 mg total) by mouth every 7 (seven) days. Take with a full glass of water on an empty stomach.   amLODipine 5 MG tablet Commonly known as: NORVASC Take 1 tablet (5 mg total) by mouth daily.   Breo Ellipta 200-25 MCG/INH Aepb Generic drug: fluticasone furoate-vilanterol INHALE 1 PUFF INTO THE LUNGS DAILY What changed: See the new instructions.   cholecalciferol 25 MCG (1000 UNIT) tablet Commonly known as: VITAMIN D3 Take 1,000 Units by mouth daily.   cyclobenzaprine 5 MG tablet Commonly known as: FLEXERIL Take 1 tablet (5 mg total) by mouth 3 (three) times daily as needed for up to 10 days for muscle spasms.   famotidine 20 MG tablet Commonly known as: PEPCID Take 1 tablet (20 mg total) by mouth 2 (two) times daily.   furosemide 20 MG tablet Commonly known as: LASIX Take 2 tablets (40 mg total) by mouth daily. TAKE 1 TABLET(20 MG) BY MOUTH DAILY AS NEEDED FOR FLUID RETENTION OR SWELLING What changed:   how much to take  how to take this  when to take this   ketorolac 10 MG tablet Commonly known as: TORADOL Take 1 tablet (10 mg total) by mouth every 8 (eight) hours.   lidocaine 5 % Commonly known as:  LIDODERM Place 1 patch onto the skin daily. Remove & Discard patch within 12 hours or as directed by MD   losartan-hydrochlorothiazide 50-12.5 MG tablet Commonly known as: HYZAAR Take 1 tablet by mouth daily.   omega-3 acid ethyl esters 1 g capsule Commonly known as: LOVAZA Take 1 capsule (1 g total) by mouth 2 (two) times daily. What changed: when to take this   Potassium Chloride ER 20 MEQ Tbcr TAKE 1 TABLET BY MOUTH DAILY What changed: how much to take   Vitamin C 500 MG Caps Take 500 mg by mouth daily.   warfarin 4 MG tablet Commonly known as: COUMADIN Take as directed. If you are unsure how to take this medication, talk to your nurse or doctor. Original instructions: Take one-and-one-half (1 & 1/2)  tablets on Mondays and Thursdays. All other day, take one (1) tablet   zinc gluconate 50 MG tablet Take 50 mg by mouth daily.       Disposition and follow-up:   [name] was discharged from [facility] in Good condition.  At the hospital follow up visit please address:   1.  Please ensure patient alert and oriented. Please ensure patient continues to be satting well on home O2 of 2L. Please consider sleep study to evaluate for OSA. Please ensure pulm follow up. Please ensure adequate lasix dose for edema. Please ensure adequate pain control in patient's back. Please ensure post op follow up  with IR.  2.  Labs / imaging needed at time of follow-up: na  3.  Pending labs/ test needing follow-up: na  Follow-up Appointments: Follow-up Information    Spero Geralds, MD. Schedule an appointment as soon as possible for a visit in 1 week(s).   Specialty: Pulmonary Disease Why: Thursday, June 3rd, at 11:30 AM Contact information: [address] [place] [address] [place] [facility] Course by problem list:  1. Acute encephalopathy 2. Hypoxic and hypercarbic respiratory failure This is a 69 year old male with a history of  sarcoidosis, on 2L supplemental oxygen and chronic corticosteroids, hypertension, recent DVT and PE secondary to medication noncompliance February 2021, osteoporosis, recent history of COVID-19 in January 2021, and OSA who presented with altered mental status, hallucinations, and dyspnea.  Acute encephalopathy was likely secondary to his hypercapnia, however the cause for his hypoxic and hypercarbic respiratory failure appears unknown.  He was given antibiotics for possible infection, white count was elevated however he is on chronic steroids at home. RVP was negative. Diuresed for fluid overload.  Has right sided heart failure and edematous lower extremities. Overall his hypercarbia is not totally consistent with pulmonary sarcoidosis and he has no history of severe COPD or other causes of CO2 retention. He does have a history of right heart failure however this should not cause the hypoxia that we are seeing. Would not explain increased dyspnea. We continued to treat for possible pneumonia.  -Pulm consulted and recommended continued oxygen supplementation, continued bronchodilators, continued steroids, short-term antibiotics and gentle diuresis; believed patient could have underlying sleep disorder and recommends pulm follow up outpatient -pulm recommended IV lasix day before discharge and patient diuresed  -on exam patient satting 100% on 2L, RR 20, no crackles in his lungs  -patient given extra 40 mg IV lasix on day of discharge -outpatient lasix increased to 40 mg po daily -Continue supplemental O2 -continue bronchodilators and steroids -discharge w/ close pulm follow up  3. Right heart failure Echo on 02/16/2020 demonstrated an LVEF of 50-55% but with a markedly dilated RV and severely reduced RV function.  Noted to have bilateral 2+ pitting  edema on exam during admission and has received multiple days of 40 mg IV lasix. Echo demonstrates elevated RV pressure and volume overload as well as  severely reduced right ventricular systolic function consistent with cor pulmonale. Diuresed as above.  4. Stage IV Sarcoidosis Follows with Dr. Shearon Stalls for this, he was unable to complete the corticosteroid taper over the past few months, currently on prednisone 20 mg daily.  Did not follow-up in January as he was admitted in the hospital for Covid infection.  On chart review, it appears that his disease has progressed, with high probability of significant pulmonary fibrosis.   -Pulm consulted and recommended oxygen supplementation, bronchodilators, steroids; did not believe sarcoid contributing to this acute process -fu with outpatient pulm  5. History of PE/DVT -Most recent one was in February 2021.  Currently on warfarin at home.  INR is therapeutic so PE thought to be unlikely. Continued on warfarin per pharmacy.  6. Chronic back pain s/p kyphoplasty Patient complaining of significant back pain which he says interferes with his breathing; pain improved w/ toradol and flexeril -d/c with toradol and flexeril and fu w/ Dr. Vernard Gambles  Discharge Vitals:   BP (!) 148/91 (BP Location: Left Arm)    Pulse 68    Temp 97.7 F (36.5 C) (Oral)  Resp 16    Ht 5' 9" (1.753 m)    Wt 97.7 kg    SpO2 99%    BMI 31.81 kg/m   Pertinent Labs, Studies, and Procedures:  Results for orders placed or performed during the hospital encounter of 05/20/20 (from the past 168 hour(s))  Respiratory Panel by PCR   Collection Time: 05/20/20 12:46 AM   Specimen: Nasopharyngeal Swab; Respiratory  Result Value Ref Range   Adenovirus NOT DETECTED NOT DETECTED   Coronavirus 229E NOT DETECTED NOT DETECTED   Coronavirus HKU1 NOT DETECTED NOT DETECTED   Coronavirus NL63 NOT DETECTED NOT DETECTED   Coronavirus OC43 NOT DETECTED NOT DETECTED   Metapneumovirus NOT DETECTED NOT DETECTED   Rhinovirus / Enterovirus NOT DETECTED NOT DETECTED   Influenza A NOT DETECTED NOT DETECTED   Influenza B NOT DETECTED NOT DETECTED    Parainfluenza Virus 1 NOT DETECTED NOT DETECTED   Parainfluenza Virus 2 NOT DETECTED NOT DETECTED   Parainfluenza Virus 3 NOT DETECTED NOT DETECTED   Parainfluenza Virus 4 NOT DETECTED NOT DETECTED   Respiratory Syncytial Virus NOT DETECTED NOT DETECTED   Bordetella pertussis NOT DETECTED NOT DETECTED   Chlamydophila pneumoniae NOT DETECTED NOT DETECTED   Mycoplasma pneumoniae NOT DETECTED NOT DETECTED  Blood culture (routine x 2)   Collection Time: 05/20/20 12:10 PM   Specimen: BLOOD RIGHT HAND  Result Value Ref Range   Specimen Description BLOOD RIGHT HAND    Special Requests      BOTTLES DRAWN AEROBIC AND ANAEROBIC Blood Culture results may not be optimal due to an inadequate volume of blood received in culture bottles   Culture      NO GROWTH 5 DAYS Performed at [facility], [address], [place], [place] [address]    Report Status 05/25/2020 FINAL   I-STAT 7, (LYTES, BLD GAS, ICA, H+H)   Collection Time: 05/20/20 12:11 PM  Result Value Ref Range   pH, Arterial 7.357 7.350 - 7.450   pCO2 arterial 57.0 (H) 32.0 - 48.0 mmHg   pO2, Arterial 85 83.0 - 108.0 mmHg   Bicarbonate 32.0 (H) 20.0 - 28.0 mmol/L   TCO2 34 (H) 22 - 32 mmol/L   O2 Saturation 96.0 %   Acid-Base Excess 5.0 (H) 0.0 - 2.0 mmol/L   Sodium 138 135 - 145 mmol/L   Potassium 3.7 3.5 - 5.1 mmol/L   Calcium, Ion 1.25 1.15 - 1.40 mmol/L   HCT 37.0 (L) 39.0 - 52.0 %   Hemoglobin 12.6 (L) 13.0 - 17.0 g/dL   Sample type ARTERIAL   SARS Coronavirus 2 by RT PCR (hospital order, performed in [facility] lab) Nasopharyngeal Nasopharyngeal Swab   Collection Time: 05/20/20 12:12 PM   Specimen: Nasopharyngeal Swab  Result Value Ref Range   SARS Coronavirus 2 NEGATIVE NEGATIVE  Blood culture (routine x 2)   Collection Time: 05/20/20 12:19 PM   Specimen: BLOOD  Result Value Ref Range   Specimen Description BLOOD LEFT ANTECUBITAL    Special Requests      BOTTLES DRAWN AEROBIC AND ANAEROBIC Blood Culture  results may not be optimal due to an inadequate volume of blood received in culture bottles   Culture      NO GROWTH 5 DAYS Performed at [facility], [address] [place], [place] [address]    Report Status 05/25/2020 FINAL   Brain natriuretic peptide   Collection Time: 05/20/20 12:28 PM  Result Value Ref Range   B  Natriuretic Peptide 82.9 0.0 - 100.0 pg/mL  Lactic acid, plasma   Collection Time: 05/20/20 12:28 PM  Result Value Ref Range   Lactic Acid, Venous 1.2 0.5 - 1.9 mmol/L  CBC with Differential   Collection Time: 05/20/20 12:29 PM  Result Value Ref Range   WBC 18.4 (H) 4.0 - 10.5 K/uL   RBC 3.90 (L) 4.22 - 5.81 MIL/uL   Hemoglobin 11.0 (L) 13.0 - 17.0 g/dL   HCT 37.0 (L) 39.0 - 52.0 %   MCV 94.9 80.0 - 100.0 fL   MCH 28.2 26.0 - 34.0 pg   MCHC 29.7 (L) 30.0 - 36.0 g/dL   RDW 18.3 (H) 11.5 - 15.5 %   Platelets 459 (H) 150 - 400 K/uL   nRBC 0.0 0.0 - 0.2 %   Neutrophils Relative % 87 %   Neutro Abs 15.9 (H) 1.7 - 7.7 K/uL   Lymphocytes Relative 6 %   Lymphs Abs 1.2 0.7 - 4.0 K/uL   Monocytes Relative 6 %   Monocytes Absolute 1.2 (H) 0.1 - 1.0 K/uL   Eosinophils Relative 0 %   Eosinophils Absolute 0.0 0.0 - 0.5 K/uL   Basophils Relative 0 %   Basophils Absolute 0.0 0.0 - 0.1 K/uL   Immature Granulocytes 1 %   Abs Immature Granulocytes 0.13 (H) 0.00 - 0.07 K/uL  Comprehensive metabolic panel   Collection Time: 05/20/20  1:38 PM  Result Value Ref Range   Sodium 138 135 - 145 mmol/L   Potassium 3.7 3.5 - 5.1 mmol/L   Chloride 100 98 - 111 mmol/L   CO2 27 22 - 32 mmol/L   Glucose, Bld 114 (H) 70 - 99 mg/dL   BUN 25 (H) 8 - 23 mg/dL   Creatinine, Ser 0.82 0.61 - 1.24 mg/dL   Calcium 8.7 (L) 8.9 - 10.3 mg/dL   Total Protein 7.0 6.5 - 8.1 g/dL   Albumin 3.6 3.5 - 5.0 g/dL   AST 22 15 - 41 U/L   ALT 18 0 - 44 U/L   Alkaline Phosphatase 63 38 - 126 U/L   Total Bilirubin 0.8 0.3 - 1.2 mg/dL   GFR calc non Af Amer >60 >60 mL/min   GFR calc Af Amer >60 >60  mL/min   Anion gap 11 5 - 15  Protime-INR   Collection Time: 05/20/20  2:33 PM  Result Value Ref Range   Prothrombin Time 29.0 (H) 11.4 - 15.2 seconds   INR 2.8 (H) 0.8 - 1.2  Urine culture   Collection Time: 05/20/20  4:16 PM   Specimen: Urine, Random  Result Value Ref Range   Specimen Description URINE, RANDOM    Special Requests      NONE Performed at [facility], [address] [place], [place] [address]    Culture 20,000 COLONIES/mL ENTEROCOCCUS FAECALIS (A)    Report Status 05/22/2020 FINAL    Organism ID, Bacteria ENTEROCOCCUS FAECALIS (A)       Susceptibility   Enterococcus faecalis - MIC*    AMPICILLIN <=2 SENSITIVE Sensitive     NITROFURANTOIN <=16 SENSITIVE Sensitive     VANCOMYCIN 1 SENSITIVE Sensitive     * 20,000 COLONIES/mL ENTEROCOCCUS FAECALIS  Urinalysis, Routine w reflex microscopic   Collection Time: 05/20/20  4:40 PM  Result Value Ref Range   Color, Urine YELLOW YELLOW   APPearance CLEAR CLEAR   Specific Gravity, Urine 1.031 (H) 1.005 - 1.030   pH 5.0 5.0 - 8.0   Glucose,  UA NEGATIVE NEGATIVE mg/dL   Hgb urine dipstick NEGATIVE NEGATIVE   Bilirubin Urine NEGATIVE NEGATIVE   Ketones, ur 5 (A) NEGATIVE mg/dL   Protein, ur NEGATIVE NEGATIVE mg/dL   Nitrite NEGATIVE NEGATIVE   Leukocytes,Ua NEGATIVE NEGATIVE  Urine rapid drug screen (hosp performed)   Collection Time: 05/20/20  7:35 PM  Result Value Ref Range   Opiates NONE DETECTED NONE DETECTED   Cocaine NONE DETECTED NONE DETECTED   Benzodiazepines NONE DETECTED NONE DETECTED   Amphetamines NONE DETECTED NONE DETECTED   Tetrahydrocannabinol NONE DETECTED NONE DETECTED   Barbiturates NONE DETECTED NONE DETECTED  I-STAT 7, (LYTES, BLD GAS, ICA, H+H)   Collection Time: 05/20/20  9:09 PM  Result Value Ref Range   pH, Arterial 7.284 (L) 7.350 - 7.450   pCO2 arterial 69.7 (HH) 32.0 - 48.0 mmHg   pO2, Arterial 116 (H) 83.0 - 108.0 mmHg   Bicarbonate 33.0 (H) 20.0 - 28.0 mmol/L   TCO2 35 (H)  22 - 32 mmol/L   O2 Saturation 98.0 %   Acid-Base Excess 4.0 (H) 0.0 - 2.0 mmol/L   Sodium 138 135 - 145 mmol/L   Potassium 3.8 3.5 - 5.1 mmol/L   Calcium, Ion 1.27 1.15 - 1.40 mmol/L   HCT 36.0 (L) 39.0 - 52.0 %   Hemoglobin 12.2 (L) 13.0 - 17.0 g/dL   Collection site Radial    Drawn by RT    Sample type ARTERIAL   I-STAT 7, (LYTES, BLD GAS, ICA, H+H)   Collection Time: 05/21/20 12:30 AM  Result Value Ref Range   pH, Arterial 7.334 (L) 7.350 - 7.450   pCO2 arterial 64.7 (H) 32.0 - 48.0 mmHg   pO2, Arterial 97 83.0 - 108.0 mmHg   Bicarbonate 34.6 (H) 20.0 - 28.0 mmol/L   TCO2 37 (H) 22 - 32 mmol/L   O2 Saturation 97.0 %   Acid-Base Excess 7.0 (H) 0.0 - 2.0 mmol/L   Sodium 138 135 - 145 mmol/L   Potassium 3.8 3.5 - 5.1 mmol/L   Calcium, Ion 1.21 1.15 - 1.40 mmol/L   HCT 35.0 (L) 39.0 - 52.0 %   Hemoglobin 11.9 (L) 13.0 - 17.0 g/dL   Patient temperature 97.8 F    Collection site Radial    Drawn by RT    Sample type ARTERIAL   MRSA PCR Screening   Collection Time: 05/21/20  1:17 AM   Specimen: Nasal Mucosa; Nasopharyngeal  Result Value Ref Range   MRSA by PCR NEGATIVE NEGATIVE  Basic metabolic panel   Collection Time: 05/21/20  3:25 AM  Result Value Ref Range   Sodium 139 135 - 145 mmol/L   Potassium 3.8 3.5 - 5.1 mmol/L   Chloride 99 98 - 111 mmol/L   CO2 29 22 - 32 mmol/L   Glucose, Bld 115 (H) 70 - 99 mg/dL   BUN 18 8 - 23 mg/dL   Creatinine, Ser 0.72 0.61 - 1.24 mg/dL   Calcium 8.5 (L) 8.9 - 10.3 mg/dL   GFR calc non Af Amer >60 >60 mL/min   GFR calc Af Amer >60 >60 mL/min   Anion gap 11 5 - 15  CBC   Collection Time: 05/21/20  3:25 AM  Result Value Ref Range   WBC 14.2 (H) 4.0 - 10.5 K/uL   RBC 3.80 (L) 4.22 - 5.81 MIL/uL   Hemoglobin 10.7 (L) 13.0 - 17.0 g/dL   HCT 35.9 (L) 39.0 - 52.0 %   MCV 94.5 80.0 - 100.0  fL   MCH 28.2 26.0 - 34.0 pg   MCHC 29.8 (L) 30.0 - 36.0 g/dL   RDW 17.2 (H) 11.5 - 15.5 %   Platelets 394 150 - 400 K/uL   nRBC 0.0 0.0 - 0.2 %   Protime-INR   Collection Time: 05/21/20  3:25 AM  Result Value Ref Range   Prothrombin Time 30.9 (H) 11.4 - 15.2 seconds   INR 3.1 (H) 0.8 - 1.2  Blood gas, arterial   Collection Time: 05/21/20  8:24 AM  Result Value Ref Range   FIO2 36.00    pH, Arterial 7.385 7.350 - 7.450   pCO2 arterial 54.5 (H) 32.0 - 48.0 mmHg   pO2, Arterial 106 83.0 - 108.0 mmHg   Bicarbonate 31.9 (H) 20.0 - 28.0 mmol/L   Acid-Base Excess 6.9 (H) 0.0 - 2.0 mmol/L   O2 Saturation 97.7 %   Patient temperature 36.9    Collection site RIGHT RADIAL    Drawn by [phone]    Sample type ARTERIAL DRAW    Allens test (pass/fail) PASS PASS  ECHOCARDIOGRAM COMPLETE   Collection Time: 05/21/20  3:42 PM  Result Value Ref Range   Weight 3,492.09 oz   Height 69 in   BP 125/87 mmHg  Protime-INR   Collection Time: 05/22/20  2:14 AM  Result Value Ref Range   Prothrombin Time 30.1 (H) 11.4 - 15.2 seconds   INR 3.0 (H) 0.8 - 1.2  CBC   Collection Time: 05/22/20  8:03 AM  Result Value Ref Range   WBC 12.8 (H) 4.0 - 10.5 K/uL   RBC 3.64 (L) 4.22 - 5.81 MIL/uL   Hemoglobin 10.3 (L) 13.0 - 17.0 g/dL   HCT 35.7 (L) 39.0 - 52.0 %   MCV 98.1 80.0 - 100.0 fL   MCH 28.3 26.0 - 34.0 pg   MCHC 28.9 (L) 30.0 - 36.0 g/dL   RDW 17.1 (H) 11.5 - 15.5 %   Platelets 356 150 - 400 K/uL   nRBC 0.2 0.0 - 0.2 %  Basic metabolic panel   Collection Time: 05/22/20  8:03 AM  Result Value Ref Range   Sodium 141 135 - 145 mmol/L   Potassium 3.7 3.5 - 5.1 mmol/L   Chloride 102 98 - 111 mmol/L   CO2 31 22 - 32 mmol/L   Glucose, Bld 106 (H) 70 - 99 mg/dL   BUN 19 8 - 23 mg/dL   Creatinine, Ser 0.80 0.61 - 1.24 mg/dL   Calcium 8.7 (L) 8.9 - 10.3 mg/dL   GFR calc non Af Amer >60 >60 mL/min   GFR calc Af Amer >60 >60 mL/min   Anion gap 8 5 - 15  Protime-INR   Collection Time: 05/23/20  2:35 AM  Result Value Ref Range   Prothrombin Time 26.2 (H) 11.4 - 15.2 seconds   INR 2.5 (H) 0.8 - 1.2  Protime-INR   Collection Time: 05/24/20   2:50 AM  Result Value Ref Range   Prothrombin Time 27.1 (H) 11.4 - 15.2 seconds   INR 2.6 (H) 0.8 - 1.2  Protime-INR   Collection Time: 05/25/20  1:48 AM  Result Value Ref Range   Prothrombin Time 29.3 (H) 11.4 - 15.2 seconds   INR 2.9 (H) 0.8 - 1.2  Basic metabolic panel   Collection Time: 05/25/20  7:50 AM  Result Value Ref Range   Sodium 141 135 - 145 mmol/L   Potassium 3.3 (L) 3.5 - 5.1 mmol/L   Chloride 96 (L)  98 - 111 mmol/L   CO2 34 (H) 22 - 32 mmol/L   Glucose, Bld 101 (H) 70 - 99 mg/dL   BUN 18 8 - 23 mg/dL   Creatinine, Ser 0.70 0.61 - 1.24 mg/dL   Calcium 8.9 8.9 - 10.3 mg/dL   GFR calc non Af Amer >60 >60 mL/min   GFR calc Af Amer >60 >60 mL/min   Anion gap 11 5 - 15   CT Head Wo Contrast  Result Date: 05/20/2020 CLINICAL DATA:  Altered mental status EXAM: CT HEAD WITHOUT CONTRAST TECHNIQUE: Contiguous axial images were obtained from the base of the skull through the vertex without intravenous contrast. COMPARISON:  None. FINDINGS: Brain: No acute territorial infarction, hemorrhage or intracranial mass. The ventricles are nonenlarged. Vascular: No hyperdense vessels. Scattered carotid vascular calcifications. Skull: Left parietal craniotomy.  No fracture Sinuses/Orbits: No acute finding. Other: None IMPRESSION: 1. No CT evidence for acute intracranial abnormality. 2. Prior left craniotomy Electronically Signed   By: Donavan Foil M.D.   On: 05/20/2020 17:54   DG Chest Portable 1 View  Result Date: 05/20/2020 CLINICAL DATA:  Dyspnea EXAM: PORTABLE CHEST 1 VIEW COMPARISON:  03/07/2020 FINDINGS: Cardiac shadow is stable. The aortic calcifications are noted. Chronic fibrotic changes are noted in both lungs consistent with the given clinical history of sarcoidosis. No focal infiltrate or sizable effusion is seen. No bony abnormality is noted. IMPRESSION: Chronic scarring bilaterally without acute abnormality. Electronically Signed   By: Inez Catalina M.D.   On: 05/20/2020 15:59    DG Abd Portable 2V  Result Date: 05/20/2020 CLINICAL DATA:  Abdominal distension. EXAM: PORTABLE ABDOMEN - 2 VIEW COMPARISON:  February 27, 2020 FINDINGS: Again noted is a retrievable IVC filter. The bowel gas pattern is nonspecific with some mild gaseous distention of loops of small bowel and colon scattered throughout the abdomen. There is an above average amount of stool in the colon. There is no evidence for high-grade small bowel obstruction. No evidence for pneumatosis. Evaluation is limited by patient body habitus. The patient appears to be status post prior multilevel vertebral augmentation of the thoracic spine. IMPRESSION: 1. Nonobstructive bowel gas pattern. 2. Above average amount of stool in the colon. 3. Retrievable IVC filter. Consider IR consultation for potential removal as clinically indicated. 4. Habitus limited study. Electronically Signed   By: Constance Holster M.D.   On: 05/20/2020 20:50   IR KYPHO THORACIC WITH BONE BIOPSY  Result Date: 05/04/2020 CLINICAL DATA:  Sarcoid, pulmonary arterial hypertension, acute on chronic respiratory failure, midthoracic pain which limits activity in rehab. MR demonstrates Subacute compression fractures of the T8, T9 and T10 vertebral bodies with less than 25% height loss and no associated stenosis. See previous consultation. EXAM: KYPHOPLASTY AT THORACIC T8 KYPHOPLASTY AT THORACIC T9 KYPHOPLASTY AT THORACIC T10 TECHNIQUE: The procedure, risks (including but not limited to bleeding, infection, organ damage), benefits, and alternatives were explained to the patient. Questions regarding the procedure were encouraged and answered. The patient understands and consents to the procedure. The patient was placed prone on the fluoroscopic table. The skin overlying the thoracic region was then prepped and draped in the usual sterile fashion. Maximal barrier sterile technique was utilized including caps, mask, sterile gowns, sterile gloves, sterile drape, hand  hygiene and skin antiseptic. Intravenous Fentanyl 148mg and Versed 2.576mwere administered as conscious sedation during continuous monitoring of the patient's level of consciousness and physiological / cardiorespiratory status by the radiology RN, with a total moderate sedation time of  55 minutes. As antibiotic prophylaxis, cefazolin 2 g was ordered pre-procedure and administered intravenously within !one hour! of incision. The right pedicle at thoracic T8 was then infiltrated with 1% lidocaine followed by the advancement of a Kyphon trocar needle through the right pedicle into the posterior one-third. The trocar was removed and the osteo drill was advanced to the anterior third of the vertebral body. The osteo drill was retracted. Through the working cannula, a Kyphon inflatable bone tamp 15 x 3 was advanced and positioned with the distal marker 5 mm from the anterior aspect of the cortex. Crossing of the midline was seen on the AP projection. At this time, the balloon was expanded using contrast via a Kyphon inflation syringe device via micro tubing. In similar fashion, the right pedicle at thoracic T10 was then infiltrated with 1% lidocaine followed by the advancement of a Kyphon trocar needle through the right pedicle into the posterior one-third. The trocar was removed and the osteo drill was advanced to the anterior third of the vertebral body. The osteo drill was retracted. Through the working cannula, a Kyphon inflatable bone tamp 15 x 3 was advanced and positioned with the distal marker 5 mm from the anterior aspect of the cortex. Crossing of the midline was seen on the AP projection. At this time, the balloon was expanded using contrast via a Kyphon inflation syringe device via micro tubing. In similar fashion, the left pedicle at thoracic T9 was infiltrated with 1% lidocaine followed by the advancement of a Kyphon trocar needle through the left pedicle into the posterior third of the vertebral body. The  osteo drill was coaxially advanced to the anterior right third. The osteo drill was exchanged for a Kyphon inflatable bone tamp 15 x 3, advanced to the 5 mm of the anterior aspect of the cortex. The balloon was then expanded using contrast as above. Inflations were continued until there was near apposition with the end plates. At this time, methylmethacrylate mixture was reconstituted in the Kyphon bone mixing device system. This was then loaded into the delivery mechanism, attached to Kyphon bone fillers. The balloons were deflated and removed followed by the instillation of methylmethacrylate mixture with excellent filling in the AP and lateral projections. No extravasation was noted in the disk spaces or posteriorly into the spinal canal. No epidural venous contamination was seen. The patient tolerated the procedure well. There were no acute complications. The working cannulae and the bone filler were then retrieved and removed. COMPLICATIONS: COMPLICATIONS None immediate. IMPRESSION: 1. Status post vertebral body augmentation using balloon kyphoplasty at thoracic T8 as described without event. 2. Status post vertebral body augmentation using balloon kyphoplasty at thoracic T9 as described without event. 3. Status post vertebral body augmentation using balloon kyphoplasty at thoracic T10 as described without event. 4. Per CMS PQRS reporting requirements (PQRS Measure 24): Given the patient's age of greater than 101 and the fracture site (hip, distal radius, or spine), the patient should be tested for osteoporosis using DXA, and the appropriate treatment considered based on the DXA results. Electronically Signed   By: Lucrezia Europe M.D.   On: 05/04/2020 15:18   IR KYPHO EA ADDL LEVEL THORACIC OR LUMBAR  Result Date: 05/04/2020 CLINICAL DATA:  Sarcoid, pulmonary arterial hypertension, acute on chronic respiratory failure, midthoracic pain which limits activity in rehab. MR demonstrates Subacute compression  fractures of the T8, T9 and T10 vertebral bodies with less than 25% height loss and no associated stenosis. See previous consultation. EXAM: KYPHOPLASTY  AT THORACIC T8 KYPHOPLASTY AT THORACIC T9 KYPHOPLASTY AT THORACIC T10 TECHNIQUE: The procedure, risks (including but not limited to bleeding, infection, organ damage), benefits, and alternatives were explained to the patient. Questions regarding the procedure were encouraged and answered. The patient understands and consents to the procedure. The patient was placed prone on the fluoroscopic table. The skin overlying the thoracic region was then prepped and draped in the usual sterile fashion. Maximal barrier sterile technique was utilized including caps, mask, sterile gowns, sterile gloves, sterile drape, hand hygiene and skin antiseptic. Intravenous Fentanyl 117mg and Versed 2.580mwere administered as conscious sedation during continuous monitoring of the patient's level of consciousness and physiological / cardiorespiratory status by the radiology RN, with a total moderate sedation time of 55 minutes. As antibiotic prophylaxis, cefazolin 2 g was ordered pre-procedure and administered intravenously within !one hour! of incision. The right pedicle at thoracic T8 was then infiltrated with 1% lidocaine followed by the advancement of a Kyphon trocar needle through the right pedicle into the posterior one-third. The trocar was removed and the osteo drill was advanced to the anterior third of the vertebral body. The osteo drill was retracted. Through the working cannula, a Kyphon inflatable bone tamp 15 x 3 was advanced and positioned with the distal marker 5 mm from the anterior aspect of the cortex. Crossing of the midline was seen on the AP projection. At this time, the balloon was expanded using contrast via a Kyphon inflation syringe device via micro tubing. In similar fashion, the right pedicle at thoracic T10 was then infiltrated with 1% lidocaine followed by the  advancement of a Kyphon trocar needle through the right pedicle into the posterior one-third. The trocar was removed and the osteo drill was advanced to the anterior third of the vertebral body. The osteo drill was retracted. Through the working cannula, a Kyphon inflatable bone tamp 15 x 3 was advanced and positioned with the distal marker 5 mm from the anterior aspect of the cortex. Crossing of the midline was seen on the AP projection. At this time, the balloon was expanded using contrast via a Kyphon inflation syringe device via micro tubing. In similar fashion, the left pedicle at thoracic T9 was infiltrated with 1% lidocaine followed by the advancement of a Kyphon trocar needle through the left pedicle into the posterior third of the vertebral body. The osteo drill was coaxially advanced to the anterior right third. The osteo drill was exchanged for a Kyphon inflatable bone tamp 15 x 3, advanced to the 5 mm of the anterior aspect of the cortex. The balloon was then expanded using contrast as above. Inflations were continued until there was near apposition with the end plates. At this time, methylmethacrylate mixture was reconstituted in the Kyphon bone mixing device system. This was then loaded into the delivery mechanism, attached to Kyphon bone fillers. The balloons were deflated and removed followed by the instillation of methylmethacrylate mixture with excellent filling in the AP and lateral projections. No extravasation was noted in the disk spaces or posteriorly into the spinal canal. No epidural venous contamination was seen. The patient tolerated the procedure well. There were no acute complications. The working cannulae and the bone filler were then retrieved and removed. COMPLICATIONS: COMPLICATIONS None immediate. IMPRESSION: 1. Status post vertebral body augmentation using balloon kyphoplasty at thoracic T8 as described without event. 2. Status post vertebral body augmentation using balloon  kyphoplasty at thoracic T9 as described without event. 3. Status post vertebral body augmentation using  balloon kyphoplasty at thoracic T10 as described without event. 4. Per CMS PQRS reporting requirements (PQRS Measure 24): Given the patient's age of greater than 9 and the fracture site (hip, distal radius, or spine), the patient should be tested for osteoporosis using DXA, and the appropriate treatment considered based on the DXA results. Electronically Signed   By: Lucrezia Europe M.D.   On: 05/04/2020 15:18   IR KYPHO EA ADDL LEVEL THORACIC OR LUMBAR  Result Date: 05/04/2020 CLINICAL DATA:  Sarcoid, pulmonary arterial hypertension, acute on chronic respiratory failure, midthoracic pain which limits activity in rehab. MR demonstrates Subacute compression fractures of the T8, T9 and T10 vertebral bodies with less than 25% height loss and no associated stenosis. See previous consultation. EXAM: KYPHOPLASTY AT THORACIC T8 KYPHOPLASTY AT THORACIC T9 KYPHOPLASTY AT THORACIC T10 TECHNIQUE: The procedure, risks (including but not limited to bleeding, infection, organ damage), benefits, and alternatives were explained to the patient. Questions regarding the procedure were encouraged and answered. The patient understands and consents to the procedure. The patient was placed prone on the fluoroscopic table. The skin overlying the thoracic region was then prepped and draped in the usual sterile fashion. Maximal barrier sterile technique was utilized including caps, mask, sterile gowns, sterile gloves, sterile drape, hand hygiene and skin antiseptic. Intravenous Fentanyl 166mg and Versed 2.542mwere administered as conscious sedation during continuous monitoring of the patient's level of consciousness and physiological / cardiorespiratory status by the radiology RN, with a total moderate sedation time of 55 minutes. As antibiotic prophylaxis, cefazolin 2 g was ordered pre-procedure and administered intravenously within !one  hour! of incision. The right pedicle at thoracic T8 was then infiltrated with 1% lidocaine followed by the advancement of a Kyphon trocar needle through the right pedicle into the posterior one-third. The trocar was removed and the osteo drill was advanced to the anterior third of the vertebral body. The osteo drill was retracted. Through the working cannula, a Kyphon inflatable bone tamp 15 x 3 was advanced and positioned with the distal marker 5 mm from the anterior aspect of the cortex. Crossing of the midline was seen on the AP projection. At this time, the balloon was expanded using contrast via a Kyphon inflation syringe device via micro tubing. In similar fashion, the right pedicle at thoracic T10 was then infiltrated with 1% lidocaine followed by the advancement of a Kyphon trocar needle through the right pedicle into the posterior one-third. The trocar was removed and the osteo drill was advanced to the anterior third of the vertebral body. The osteo drill was retracted. Through the working cannula, a Kyphon inflatable bone tamp 15 x 3 was advanced and positioned with the distal marker 5 mm from the anterior aspect of the cortex. Crossing of the midline was seen on the AP projection. At this time, the balloon was expanded using contrast via a Kyphon inflation syringe device via micro tubing. In similar fashion, the left pedicle at thoracic T9 was infiltrated with 1% lidocaine followed by the advancement of a Kyphon trocar needle through the left pedicle into the posterior third of the vertebral body. The osteo drill was coaxially advanced to the anterior right third. The osteo drill was exchanged for a Kyphon inflatable bone tamp 15 x 3, advanced to the 5 mm of the anterior aspect of the cortex. The balloon was then expanded using contrast as above. Inflations were continued until there was near apposition with the end plates. At this time, methylmethacrylate mixture was reconstituted in  the Kyphon bone  mixing device system. This was then loaded into the delivery mechanism, attached to Kyphon bone fillers. The balloons were deflated and removed followed by the instillation of methylmethacrylate mixture with excellent filling in the AP and lateral projections. No extravasation was noted in the disk spaces or posteriorly into the spinal canal. No epidural venous contamination was seen. The patient tolerated the procedure well. There were no acute complications. The working cannulae and the bone filler were then retrieved and removed. COMPLICATIONS: COMPLICATIONS None immediate. IMPRESSION: 1. Status post vertebral body augmentation using balloon kyphoplasty at thoracic T8 as described without event. 2. Status post vertebral body augmentation using balloon kyphoplasty at thoracic T9 as described without event. 3. Status post vertebral body augmentation using balloon kyphoplasty at thoracic T10 as described without event. 4. Per CMS PQRS reporting requirements (PQRS Measure 24): Given the patient's age of greater than 32 and the fracture site (hip, distal radius, or spine), the patient should be tested for osteoporosis using DXA, and the appropriate treatment considered based on the DXA results. Electronically Signed   By: Lucrezia Europe M.D.   On: 05/04/2020 15:18   ECHOCARDIOGRAM COMPLETE  Result Date: 05/21/2020    ECHOCARDIOGRAM REPORT   Patient Name:   MCCOY TESTA Date of Exam: 05/21/2020 Medical Rec #:  [id number]      Height:       69.0 in Accession #:    [id number]     Weight:       218.3 lb Date of Birth:  March 22, 1951      BSA:          2.144 m Patient Age:    53 years       BP:           124/87 mmHg Patient Gender: M              HR:           94 bpm. Exam Location:  Inpatient Procedure: 2D Echo, Color Doppler and Cardiac Doppler Indications:    Acute Respiratory Insufficiency R06.89  History:        Patient has prior history of Echocardiogram examinations, most                 recent 02/16/2020. Pulmonary  HTN; Risk Factors:Hypertension,                 Sleep Apnea and Lupus. COVID+ 01/06/20.  Sonographer:    Raquel Sarna Senior RDCS Referring Phys: [id number] Raynaldo Opitz RAINES IMPRESSIONS  1. Left ventricular ejection fraction, by estimation, is 55 to 60%. The left ventricle has normal function. The left ventricle has no regional wall motion abnormalities. Left ventricular diastolic parameters are consistent with Grade I diastolic dysfunction (impaired relaxation). There is the interventricular septum is flattened in systole and diastole, consistent with right ventricular pressure and volume overload.  2. Right ventricular systolic function is severely reduced. The right ventricular size is severely enlarged. Tricuspid regurgitation signal is inadequate for assessing PA pressure.  3. The mitral valve is grossly normal. No evidence of mitral valve regurgitation. No evidence of mitral stenosis.  4. The aortic valve is tricuspid. Aortic valve regurgitation is not visualized. Mild to moderate aortic valve sclerosis/calcification is present, without any evidence of aortic stenosis.  5. The inferior vena cava is dilated in size with <50% respiratory variability, suggesting right atrial pressure of 15 mmHg. Comparison(s): No significant change from prior study. Conclusion(s)/Recommendation(s): Findings consistent with Cor Pulmonale. FINDINGS  Left Ventricle: Left ventricular ejection fraction, by estimation, is 55 to 60%. The left ventricle has normal function. The left ventricle has no regional wall motion abnormalities. The left ventricular internal cavity size was normal in size. There is  no left ventricular hypertrophy. The interventricular septum is flattened in systole and diastole, consistent with right ventricular pressure and volume overload. Left ventricular diastolic parameters are consistent with Grade I diastolic dysfunction (impaired relaxation). Normal left ventricular filling pressure. Right Ventricle: The right  ventricular size is severely enlarged. No increase in right ventricular wall thickness. Right ventricular systolic function is severely reduced. Tricuspid regurgitation signal is inadequate for assessing PA pressure. Left Atrium: Left atrial size was normal in size. Right Atrium: Right atrial size was normal in size. Pericardium: Trivial pericardial effusion is present. Presence of pericardial fat pad. Mitral Valve: The mitral valve is grossly normal. No evidence of mitral valve regurgitation. No evidence of mitral valve stenosis. Tricuspid Valve: The tricuspid valve is grossly normal. Tricuspid valve regurgitation is trivial. No evidence of tricuspid stenosis. Aortic Valve: The aortic valve is tricuspid. Aortic valve regurgitation is not visualized. Mild to moderate aortic valve sclerosis/calcification is present, without any evidence of aortic stenosis. Pulmonic Valve: The pulmonic valve was grossly normal. Pulmonic valve regurgitation is not visualized. No evidence of pulmonic stenosis. Aorta: The aortic root and ascending aorta are structurally normal, with no evidence of dilitation. Venous: The inferior vena cava is dilated in size with less than 50% respiratory variability, suggesting right atrial pressure of 15 mmHg. IAS/Shunts: The atrial septum is grossly normal.  LEFT VENTRICLE PLAX 2D LVIDd:         3.30 cm  Diastology LVIDs:         2.20 cm  LV e' lateral:   9.79 cm/s LV PW:         1.00 cm  LV E/e' lateral: 5.3 LV IVS:        1.10 cm  LV e' medial:    4.03 cm/s LVOT diam:     2.40 cm  LV E/e' medial:  12.9 LV SV:         74 LV SV Index:   34 LVOT Area:     4.52 cm  RIGHT VENTRICLE RV S prime:     9.46 cm/s TAPSE (M-mode): 1.3 cm LEFT ATRIUM             Index       RIGHT ATRIUM           Index LA diam:        2.20 cm 1.03 cm/m  RA Area:     18.60 cm LA Vol (A2C):   47.1 ml 21.97 ml/m RA Volume:   57.20 ml  26.68 ml/m LA Vol (A4C):   46.5 ml 21.69 ml/m LA Biplane Vol: 50.8 ml 23.69 ml/m  AORTIC  VALVE LVOT Vmax:   90.75 cm/s LVOT Vmean:  65.050 cm/s LVOT VTI:    0.163 m  AORTA Ao Root diam: 3.50 cm Ao Asc diam:  3.60 cm MITRAL VALVE MV Area (PHT): 3.77 cm    SHUNTS MV Decel Time: 201 msec    Systemic VTI:  0.16 m MV E velocity: 51.80 cm/s  Systemic Diam: 2.40 cm MV A velocity: 93.00 cm/s MV E/A ratio:  0.56 Eleonore Chiquito MD Electronically signed by Eleonore Chiquito MD Signature Date/Time: 05/21/2020/4:20:35 PM    Final      Discharge Instructions: Discharge Instructions    Activity as tolerated -  No restrictions   Complete by: As directed    Activity as tolerated - No restrictions   Complete by: As directed    Diet - low sodium heart healthy   Complete by: As directed    Diet general   Complete by: As directed    Increase activity slowly   Complete by: As directed    Increase activity slowly   Complete by: As directed       Signed: Al Decant, MD 05/26/2020, 2:17 PM   Pager: [phone]

## 2020-05-23 NOTE — Progress Notes (Signed)
Subjective:   He feels like he is breathing well this morning. The only thing really bothering him is his ongoing back pain. This has improved since the addition of percocet.  Objective:  Vital signs in last 24 hours: Vitals:   05/23/20 0416 05/23/20 0505 05/23/20 0622 05/23/20 0745  BP: (!) 124/97   (!) 129/93  Pulse: 83 98  91  Resp: 15 (!) 24  19  Temp: 97.9 F (36.6 C)   97.6 F (36.4 C)  TempSrc: Oral   Oral  SpO2: 96% 97%  93%  Weight:   98.1 kg   Height:       General: chronically ill appearing, in NAD Cardiac: RRR, no m/r/g Pulm: breathing comfortably on 2L . Able to speak full sentences. Mildly labored breathing.  Neuro: a/o x3, moves all extremities   Intake/Output Summary (Last 24 hours) at 05/23/2020 1610 Last data filed at 05/23/2020 0745 Gross per 24 hour  Intake 1160 ml  Output 1300 ml  Net -140 ml   Results for orders placed or performed during the hospital encounter of 05/20/20 (from the past 24 hour(s))  Protime-INR     Status: Abnormal   Collection Time: 05/23/20  2:35 AM  Result Value Ref Range   Prothrombin Time 26.2 (H) 11.4 - 15.2 seconds   INR 2.5 (H) 0.8 - 1.2   No results found.  Assessment/Plan:  Principal Problem:   Respiratory failure with hypoxia and hypercapnia (HCC) Active Problems:   Pulmonary embolism on long-term anticoagulation therapy (HCC)   Sarcoidosis   History of DVT (deep vein thrombosis)   Hypertension   Osteoporosis   Right heart failure [facility])  This is a 69 year old male with a history of sarcoidosis, on 2L supplemental oxygen and chronic corticosteroids, hypertension, recent DVT and PE secondary to medication noncompliance February 2021, osteoporosis, recent history of COVID-19 in January 2021, and OSA who presented with altered mental status, hallucinations, and dyspnea.  Acute encephalopathy: Hypoxic and hypercarbic respiratory failure: Acute encephalopathy was likely secondary to his hypercapnia, however the  cause for his hypoxic and hypercarbic respiratory failure appears unknown.  He was given antibiotics for possible infection, white count was elevated however is on chronic steroids at home. RVP was negative. Diuresed for mild fluid overloaded.  Unclear if he meets sepsis criteria, white count is elevated however he is on chronic corticosteroids, no clear respiratory cause, no other specific symptoms of note. Overall his hypercarbia is not totally consistent with pulmonary sarcoidosis and he has no history of severe COPD or other causes of CO2 retention.  He does have a history of right heart failure however this should not cause the hypoxia that we are seeing.  We continued to treat for possible pneumonia.  -Pulm consulted and recommended continued oxygen supplementation, continued bronchodilators, continued steroids; agreed with short-term antibiotics and gentle diuresis; believed patient could have underlying sleep disorder and recommends pulm follow up outpatient; recommended dc'ing vanc    -Continue cefepime, day 4 - one more day po antibiotics -Continue supplemental O2, wean as tolerated -continue bronchodilators and steroids -dc home w/ pulm follow up   Right heart failure: Echo on 02/16/2020 demonstrated an LVEF of 50-55% but with a markedly dilated RV and severely reduced RV function.  Noted to have bilateral 2+ pitting  edema on exam during admission and has received 2 days of 40 mg IV lasix. Echo demonstrates elevated RV pressure and volume overload as well as severely reduced right ventricular systolic function consistent with  cor pulmonale   -Continue strict I's and O's and daily weights -Monitor vitals  Stage IV sarcoidosis: Follows with Dr. Shearon Stalls for this, he was unable to complete the corticosteroid taper over the past few months, currently on prednisone 20 mg daily.  Did not follow-up in January as he was admitted in the hospital for Covid infection.  On chart review, it appears that  his disease has progressed, with high probability of significant pulmonary fibrosis.  We will consult pulmonology for assistance with management of his sarcoidosis and prednisone recommendations. -Pulm consulted and recommended oxygen supplementation, bronchodilators, steroids; did not believe sarcoid contributing to this acute process  History of PE and DVT: -Most recent one was in February 2021.  Currently on warfarin at home.  INR is therapeutic so PE is unlikely.  Continue warfarin per pharmacy.  Chronic back pn s/p kyphoplasty: Patient complaining of significant back pain which he says interferes with his breathing; pain improved w/ toradol and oxycodone -d/c with toradol and percocet and fu w/ Dr. Vernard Gambles  Prior to Admission Living Arrangement: Home Anticipated Discharge Location: Home Barriers to Discharge: Continued medical management Dispo: Anticipated discharge today.  Al Decant, MD 05/23/2020, 9:22 AM Pager: [phone]

## 2020-05-23 NOTE — Progress Notes (Addendum)
After discharge summary/AVS printed, all IV's out, patient found out that he could not return to [address], where he resides with an Rx for oxycodone--he doesn't feel that he can handle the pain without it--I have paged and texted dr lanier (who placed discharge order) to see if patient can stay for another 3 days so he can get oxycodone pain med--I have talked with dr winters who has talked with dr lanier--ok for patient to stay at least one more day--ok to leave IV out, he will switch abx over to PO--patient's stay will be re-evaluated tomorrow

## 2020-05-24 ENCOUNTER — Ambulatory Visit: Payer: Medicare Other | Admitting: Internal Medicine

## 2020-05-24 DIAGNOSIS — I2609 Other pulmonary embolism with acute cor pulmonale: Secondary | ICD-10-CM

## 2020-05-24 LAB — PROTIME-INR
INR: 2.6 — ABNORMAL HIGH (ref 0.8–1.2)
Prothrombin Time: 27.1 seconds — ABNORMAL HIGH (ref 11.4–15.2)

## 2020-05-24 MED ORDER — POLYETHYLENE GLYCOL 3350 17 G PO PACK
17.0000 g | PACK | Freq: Every day | ORAL | Status: DC | PRN
  Administered 2020-05-24: 17 g via ORAL
  Filled 2020-05-24: qty 1

## 2020-05-24 MED ORDER — WARFARIN SODIUM 4 MG PO TABS
4.0000 mg | ORAL_TABLET | Freq: Once | ORAL | Status: AC
  Administered 2020-05-24: 4 mg via ORAL
  Filled 2020-05-24: qty 1

## 2020-05-24 MED ORDER — CYCLOBENZAPRINE HCL 10 MG PO TABS
5.0000 mg | ORAL_TABLET | Freq: Three times a day (TID) | ORAL | Status: DC | PRN
  Administered 2020-05-24 – 2020-05-25 (×2): 5 mg via ORAL
  Filled 2020-05-24 (×2): qty 1

## 2020-05-24 MED ORDER — AMOXICILLIN-POT CLAVULANATE 875-125 MG PO TABS: 1.0000 | ORAL_TABLET | Freq: Two times a day (BID) | ORAL | 0 refills | Status: DC

## 2020-05-24 MED ORDER — FUROSEMIDE 40 MG PO TABS
40.0000 mg | ORAL_TABLET | Freq: Every day | ORAL | Status: DC
  Administered 2020-05-24: 40 mg via ORAL
  Filled 2020-05-24: qty 1

## 2020-05-24 MED ORDER — BISACODYL 5 MG PO TBEC
5.0000 mg | DELAYED_RELEASE_TABLET | Freq: Every day | ORAL | Status: DC | PRN
  Administered 2020-05-24: 5 mg via ORAL
  Filled 2020-05-24: qty 1

## 2020-05-24 MED ORDER — FUROSEMIDE 10 MG/ML IJ SOLN
40.0000 mg | Freq: Once | INTRAMUSCULAR | Status: AC
  Administered 2020-05-24: 40 mg via INTRAVENOUS
  Filled 2020-05-24: qty 4

## 2020-05-24 MED ORDER — CYCLOBENZAPRINE HCL 5 MG PO TABS
7.5000 mg | ORAL_TABLET | Freq: Once | ORAL | Status: AC
  Administered 2020-05-24: 7.5 mg via ORAL
  Filled 2020-05-24: qty 1.5

## 2020-05-24 MED ORDER — CYCLOBENZAPRINE HCL 5 MG PO TABS: 5.0000 mg | ORAL_TABLET | Freq: Three times a day (TID) | ORAL | 0 refills | Status: DC | PRN

## 2020-05-24 NOTE — TOC Initial Note (Signed)
Transition of Care [facility]) - Initial/Assessment Note    Patient Details  Name: Jesus Russell MRN: [id number] Date of Birth: 1951-01-18  Transition of Care [facility]) CM/SW Contact:    Zenon Mayo, RN Phone Number: 05/24/2020, 10:33 AM  Clinical Narrative:                 Patient is for dc today. NO needs.  Expected Discharge Plan: Home/Self Care Barriers to Discharge: No Barriers Identified   Patient Goals and CMS Choice     Choice offered to / list presented to : NA  Expected Discharge Plan and Services Expected Discharge Plan: Home/Self Care   Discharge Planning Services: CM Consult Post Acute Care Choice: NA Living arrangements for the past 2 months: Single Family Home Expected Discharge Date: 05/24/20                 DME Agency: NA       HH Arranged: NA          Prior Living Arrangements/Services Living arrangements for the past 2 months: Single Family Home Lives with:: Relatives Patient language and need for interpreter reviewed:: Yes Do you feel safe going back to the place where you live?: Yes            Criminal Activity/Legal Involvement Pertinent to Current Situation/Hospitalization: No - Comment as needed  Activities of Daily Living Home Assistive Devices/Equipment: Other (Comment) ADL Screening (condition at time of admission) Patient's cognitive ability adequate to safely complete daily activities?: Yes Is the patient deaf or have difficulty hearing?: No Does the patient have difficulty seeing, even when wearing glasses/contacts?: No Does the patient have difficulty concentrating, remembering, or making decisions?: No Patient able to express need for assistance with ADLs?: Yes Does the patient have difficulty dressing or bathing?: No Independently performs ADLs?: Yes (appropriate for developmental age) Does the patient have difficulty walking or climbing stairs?: Yes Weakness of Legs: Both Weakness of Arms/Hands: None  Permission  Sought/Granted                  Emotional Assessment       Orientation: : Oriented to Self, Oriented to Place, Oriented to  Time, Oriented to Situation   Psych Involvement: No (comment)  Admission diagnosis:  Abdominal distention [R14.0] Respiratory failure with hypoxia (HCC) [J96.91] Respiratory failure with hypoxia and hypercapnia ([place]) [J96.91, J96.92] Patient Active Problem List   Diagnosis Date Noted  . Respiratory failure with hypoxia and hypercapnia ([place]) 05/20/2020  . 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 Personal history of covid-19   . Bleeding from the nose   . Lower extremity edema 02/03/2020  . Back pain 01/25/2020  . Compression fracture of T6 vertebra ([place]) 01/20/2020  . Cubital tunnel syndrome on right 01/20/2020  . Osteoporosis 01/20/2020  . Hepatic steatosis 10/29/2019  . Cholelithiasis 10/29/2019  . Obstructive sleep apnea 10/29/2019  . Need for pneumococcal vaccine 10/29/2019  . Hypertension 10/06/2019  . Encounter for central line placement 10/06/2019  . Long term (current) use of anticoagulants 09/14/2019  . History of DVT (deep vein thrombosis) 09/14/2019  . History of pulmonary embolism 09/14/2019  . Pulmonary embolism on long-term anticoagulation therapy ([place]) 09/09/2019  . Sarcoidosis 09/09/2019   PCP:  Jeanmarie Hubert, MD Pharmacy:   [facility] [place], [address] [place] [address] Phone: [phone] Fax: [phone]     Social  Determinants of Health (SDOH) Interventions    Readmission Risk Interventions Readmission Risk Prevention Plan 05/24/2020  Transportation Screening Complete  PCP or Specialist Appt within 3-5 Days Complete  HRI or [place] Complete  Social Work Consult for [place] Planning/Counseling Complete  Palliative Care Screening Not Applicable  Medication Review Press photographer) Complete  Some  recent data might be hidden

## 2020-05-24 NOTE — Progress Notes (Signed)
Discharge orders in for patient to leave today, patient states that " I'm still having trouble breathing and feet are really swollen, I don't feel comfortable going to [employer]".  Dr Al Decant paged regarding discharge at 1010 no return call.  14 Dr. Sherry Ruffing paged awaiting return call

## 2020-05-24 NOTE — Progress Notes (Signed)
NAME:  Jesus Russell, MRN:  [id number], DOB:  [birth year], LOS: 4 ADMISSION DATE:  05/20/2020, CONSULTATION DATE: 05/21/2020 REFERRING MD: Dr. Rebeca Alert, CHIEF COMPLAINT: Sarcoidosis  Brief History   Patient with a history of sarcoidosis was admitted with shortness of breath ? 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 Shearon Stalls once last year, November 2020 and was supposed to follow-up June 1    Past Medical History   Past Medical History:  Diagnosis Date  . Acute on chronic respiratory failure with hypoxia ([place])   . COVID-19 virus infection   . DVT of lower extremity, bilateral ([place])   . Epistaxis   . Hypertension   . Incarcerated umbilical hernia [id number]/8756   Status post repair  . Lupus anticoagulant positive    Per records from [facility]  . 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 Pulmonary artery hypertension ([place]) 04/2011   Mildly elevated pulmonary artery pressure in setting of PE (from [facility])  . Pulmonary embolism associated with COVID-19 ([place])   . Subdural hematoma (HCC)    Remote episode, occurred when taking excedrin and warfarin.      Significant Hospital Events   Appears to be improving  Consults:  Pulmonary  Procedures:  None  Significant Diagnostic Tests:  CT head IMPRESSION: 1. No CT evidence for acute intracranial abnormality. 2. Prior left craniotomy  CTa chest 02/14/20 IMPRESSION: 1. Study is positive for pulmonary embolism with a combination of occlusive and nonocclusive embolus in the lower lungs bilaterally ranging from  segmental to subsegmental sized vessels. 2. Aortic atherosclerosis, in addition to left main and 3 vessel coronary artery disease. Please note that although the presence of coronary artery calcium documents the presence of coronary artery disease, the severity of this disease and any potential stenosis cannot be assessed on this non-gated CT examination. Assessment for potential risk factor modification, dietary therapy or pharmacologic therapy may be warranted, if clinically indicated. 3. There are calcifications of the aortic valve. Echocardiographic correlation for evaluation of potential valvular dysfunction may be warranted if clinically indicated. 4. Imaging findings in the lungs compatible with reported clinical history of sarcoidosis, as above. Echo 05/21/20  Severe RV dysfunction   Micro Data:  5/29 MRSA PCR negative Urine culture 5/28> 20 k ecoli Blood cultures 5/28 > NGTD    Scheduled Meds: . amoxicillin-clavulanate  1 tablet Oral Q12H  . ascorbic acid  500 mg Oral Daily  . chlorhexidine  15 mL Mouth Rinse BID  . cholecalciferol  1,000 Units Oral Daily  . cyclobenzaprine  7.5 mg Oral Once  . famotidine  20 mg Oral BID  . fluticasone furoate-vilanterol  1 puff Inhalation Daily  . furosemide  40 mg Oral Daily  . ketorolac  10 mg Oral Q8H  . lidocaine  1 patch Transdermal Q24H  . mouth rinse  15 mL Mouth Rinse q12n4p  . predniSONE  20 mg Oral Q breakfast  . Warfarin - Pharmacist Dosing Inpatient   Does not apply q1600  . zinc sulfate  220 mg Oral Daily   Continuous Infusions:  PRN Meds:.acetaminophen **OR** acetaminophen, albuterol, oxyCODONE-acetaminophen  Interim history/subjective:  Middle aged bm sitting in chair awaiting d/c instructions  Objective   Blood pressure (!) 164/97, pulse (!) 101, temperature 98 F (36.7 C), temperature source Oral, resp. rate 20, height 5' 9" (1.753 m), weight 98.2 kg, SpO2 92 %.        Intake/Output Summary (Last 24 hours) at  05/24/2020 0908 Last data filed at 05/24/2020 0406 Gross per 24 hour  Intake 820 ml  Output 750 ml  Net 70 ml   Filed Weights   05/22/20 0631 05/23/20 0622 05/24/20 0406  Weight: 97.3 kg 98.1 kg 98.2 kg    Examination: General: Obese male no acute distress on 2 L nasal cannula HEENT: No JVD or lymphadenopathy is appreciated Neuro: Grossly intact, verbal and interacts appropriately CV: Heart sounds are regular regular rate and rhythm PULM: Abdominal chest wall paradoxus is noted.  2 L and cannula O2 sats of 98%, respiratory rate 21 GI: soft, bsx4 active  GU: Voids Extremities: 2-3+ edema bilateral lower extremities Skin: no rashes or lesions     Resolved Hospital Problem list     Assessment & Plan:  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  Well compensated off BiPAP 2 L nasal cannula sats 9400% Follow-up with pulmonary as an outpatient     Sarcoidosis Stage IV sarcoid Continue prednisone 20 mg daily  Obstructive lung disease with wheezing resolved  Continue Breo and as needed short acting bronchodilators He has a follow-up appointment scheduled with Dr. Tera Helper  History of thromboembolic disease Lab Results  Component Value Date   INR 2.6 (H) 05/24/2020   INR 2.5 (H) 05/23/2020   INR 3.0 (H) 05/22/2020    Currently on Coumadin with INR greater than 2     Cor pulmonale  Intake/Output Summary (Last 24 hours) at 05/24/2020 0909 Last data filed at 05/24/2020 0406 Gross per 24 hour  Intake 820 ml  Output 750 ml  Net 70 ml   Filed Weights   05/22/20 0631 05/23/20 0622 05/24/20 0406  Weight: 97.3 kg 98.1 kg 98.2 kg    Diuresis as tolerated, note weight gain and positive I&O  He is improved with current pharmaceutical interventions.  He has a follow-up appointment with pulmonary as outpatient.  Pulmonary critical care will be available as needed.   Richardson Landry Ervin Hensley ACNP Acute Care Nurse Practitioner [place] Please consult  Amion 05/24/2020, 9:09 AM

## 2020-05-24 NOTE — Plan of Care (Signed)

## 2020-05-24 NOTE — Progress Notes (Addendum)
Subjective:   Patient unable to be discharged yesterday as he is not able to take percocet where he lives. He continues to have pain that is concerning to him. We discussed that we would provide him additional non-narcotic pain medication on discharge. He said he was going to miss his pulm appointment this afternoon and we said we would reschedule it. He is somewhat frustrated by the status of his disease and we discussed that he was much improved from admission and follow up with his pulmonologist outpatient would be most appropriate. He was amenable.  Objective:  Vital signs in last 24 hours: Vitals:   05/23/20 1933 05/23/20 2312 05/24/20 0406 05/24/20 0715  BP: (!) 134/92 (!) 134/99 (!) 164/97   Pulse: 90 88 (!) 101   Resp: 18 (!) 23 (!) 23 20  Temp: 97.9 F (36.6 C) 97.9 F (36.6 C) (!) 97.5 F (36.4 C) 98 F (36.7 C)  TempSrc: Oral Oral Oral Oral  SpO2: 99% 97% 92%   Weight:   98.2 kg   Height:       General: chronically ill appearing, in NAD Cardiac: RRR, no m/r/g Pulm: breathing comfortably on 2L [place]. Able to speak full sentences. Mildly labored breathing.  Neuro: a/o x3, moves all extremities   Intake/Output Summary (Last 24 hours) at 05/24/2020 0957 Last data filed at 05/24/2020 0406 Gross per 24 hour  Intake 820 ml  Output 750 ml  Net 70 ml   Results for orders placed or performed during the hospital encounter of 05/20/20 (from the past 24 hour(s))  Protime-INR     Status: Abnormal   Collection Time: 05/24/20  2:50 AM  Result Value Ref Range   Prothrombin Time 27.1 (H) 11.4 - 15.2 seconds   INR 2.6 (H) 0.8 - 1.2   No results found.  Assessment/Plan:  Principal Problem:   Respiratory failure with hypoxia and hypercapnia (HCC) Active Problems:   Pulmonary embolism on long-term anticoagulation therapy (HCC)   Sarcoidosis   History of DVT (deep vein thrombosis)   Hypertension   Osteoporosis   Right heart failure [facility])  This is a 69 year old male with a  history of sarcoidosis, on 2L supplemental oxygen and chronic corticosteroids, hypertension, recent DVT and PE secondary to medication noncompliance February 2021, osteoporosis, recent history of COVID-19 in January 2021, and OSA who presented with altered mental status, hallucinations, and dyspnea.  Acute encephalopathy: Hypoxic and hypercarbic respiratory failure: Acute encephalopathy was likely secondary to his hypercapnia, however the cause for his hypoxic and hypercarbic respiratory failure appears unknown.  He was given antibiotics for possible infection, white count was elevated however is on chronic steroids at home. RVP was negative. Diuresed for mild fluid overloaded.  Unclear if he meets sepsis criteria, white count is elevated however he is on chronic corticosteroids, no clear respiratory cause, no other specific symptoms of note. Overall his hypercarbia is not totally consistent with pulmonary sarcoidosis and he has no history of severe COPD or other causes of CO2 retention.  He does have a history of right heart failure however this should not cause the hypoxia that we are seeing.  We continued to treat for possible pneumonia.  -Pulm consulted and recommended continued oxygen supplementation, continued bronchodilators, continued steroids; agreed with short-term antibiotics and gentle diuresis; believed patient could have underlying sleep disorder and recommends pulm follow up outpatient; recommended dc'ing vanc    -continue augmentin -Continue supplemental O2 -continue bronchodilators and steroids -dc home w/ pulm follow up  -addendum:  diuresis with IV lasix 40 mg today with plan to dc tomorrow  Right heart failure: Echo on 02/16/2020 demonstrated an LVEF of 50-55% but with a markedly dilated RV and severely reduced RV function.  Noted to have bilateral 2+ pitting  edema on exam during admission and has received 2 days of 40 mg IV lasix. Echo demonstrates elevated RV pressure and volume  overload as well as severely reduced right ventricular systolic function consistent with cor pulmonale   -Continue strict I's and O's and daily weights -Monitor vitals  Stage IV sarcoidosis: Follows with Dr. Shearon Stalls for this, he was unable to complete the corticosteroid taper over the past few months, currently on prednisone 20 mg daily.  Did not follow-up in January as he was admitted in the hospital for Covid infection.  On chart review, it appears that his disease has progressed, with high probability of significant pulmonary fibrosis.  We will consult pulmonology for assistance with management of his sarcoidosis and prednisone recommendations. -Pulm consulted and recommended oxygen supplementation, bronchodilators, steroids; did not believe sarcoid contributing to this acute process  History of PE and DVT: -Most recent one was in February 2021.  Currently on warfarin at home.  INR is therapeutic so PE is unlikely.  Continue warfarin per pharmacy.  Chronic back pn s/p kyphoplasty: Patient complaining of significant back pain which he says interferes with his breathing; pain improved w/ toradol and oxycodone -d/c with toradol and flexeril and fu w/ Dr. Vernard Gambles  Prior to Admission Living Arrangement: Home Anticipated Discharge Location: Home Barriers to Discharge: Continued medical management Dispo: Anticipated discharge today.  Al Decant, MD 05/24/2020, 9:57 AM Pager: [phone]

## 2020-05-24 NOTE — Progress Notes (Signed)
Internal Medicine Attending:   I saw and examined the patient. I reviewed the resident's note and I agree with the resident's findings and plan as documented in the resident's note.  Patient states that he has some swelling in his legs and would like to have this taken care of prior to discharge.  He also states that he has been with the pulmonology appointment today and is still concerned about his ongoing back pain.  Patient is admitted to hospital with acute encephalopathy secondary to hypoxic and hypercarbic respiratory failure in the setting of a history of sarcoidosis.  Patient's respiratory status is currently improved.  Will complete course of Augmentin to cover for possible underlying pneumonia.  Phone follow-up and recommendations appreciated.  Continue with O2 supplementation, bronchodilators and prednisone at 20 mg daily.  Patient to follow-up with Dr. Shearon Stalls (pulmonology) as an outpatient.  We will diurese with Lasix 40 mg IV today given increased lower extremity swelling and hope to DC home tomorrow.  Will monitor I's and O's and daily weights.  No further work-up at this time.

## 2020-05-24 NOTE — Progress Notes (Signed)
ANTICOAGULATION CONSULT NOTE  Pharmacy Consult for warfarin Indication: hx VTE  No Known Allergies  Patient Measurements: Height: _0  (175.3 cm) Weight: 98.2 kg (216 lb 7.9 oz) IBW/kg (Calculated) : 70.7  Vital Signs: Temp: 98 F (36.7 C) (06/01 0715) Temp Source: Oral (06/01 0715) BP: 164/97 (06/01 0406) Pulse Rate: 101 (06/01 0406)  Labs: Recent Labs    05/22/20 0214 05/22/20 0803 05/23/20 0235 05/24/20 0250  HGB  --  10.3*  --   --   HCT  --  35.7*  --   --   PLT  --  356  --   --   LABPROT 30.1*  --  26.2* 27.1*  INR 3.0*  --  2.5* 2.6*  CREATININE  --  0.80  --   --     Estimated Creatinine Clearance: 105 mL/min (by C-G formula based on SCr of 0.8 mg/dL).   Medical History: Past Medical History:  Diagnosis Date  . Acute on chronic respiratory failure with hypoxia ([place])   . COVID-19 virus infection   . DVT of lower extremity, bilateral ([place])   . Epistaxis   . Hypertension   . Incarcerated umbilical hernia 23/9532   Status post repair  . Lupus anticoagulant positive    Per records from [facility]  . 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 Pulmonary artery hypertension ([place]) 04/2011   Mildly elevated pulmonary artery pressure in setting of PE (from [facility])  . Pulmonary embolism associated with COVID-19 ([place])   . Subdural hematoma (HCC)    Remote episode, occurred when taking excedrin and warfarin.    Assessment: 46 yom presented to the ED with AMS and SOB. He is on chronic warfarin for history of VTE. INR therapeutic at 2.6.  PTA regimen: 40m on Mon + Thurs, 435mall other days  Goal of Therapy:  INR 2-3 Monitor platelets by anticoagulation protocol: Yes   Plan:  -Warfarin 70m63mO x1 tonight -Daily protime   MicArrie SenateharmD, BCPS Clinical Pharmacist [phone] check AMION for all [facility] 05/24/2020

## 2020-05-25 DIAGNOSIS — I50813 Acute on chronic right heart failure: Secondary | ICD-10-CM

## 2020-05-25 LAB — CULTURE, BLOOD (ROUTINE X 2)
Culture: NO GROWTH
Culture: NO GROWTH

## 2020-05-25 LAB — BASIC METABOLIC PANEL
Anion gap: 11 (ref 5–15)
BUN: 18 mg/dL (ref 8–23)
CO2: 34 mmol/L — ABNORMAL HIGH (ref 22–32)
Calcium: 8.9 mg/dL (ref 8.9–10.3)
Chloride: 96 mmol/L — ABNORMAL LOW (ref 98–111)
Creatinine, Ser: 0.7 mg/dL (ref 0.61–1.24)
GFR calc Af Amer: >60 mL/min (ref >60)
GFR calc non Af Amer: >60 mL/min (ref >60)
Glucose, Bld: 101 mg/dL — ABNORMAL HIGH (ref 70–99)
Potassium: 3.3 mmol/L — ABNORMAL LOW (ref 3.5–5.1)
Sodium: 141 mmol/L (ref 135–145)

## 2020-05-25 LAB — PROTIME-INR
INR: 2.9 — ABNORMAL HIGH (ref 0.8–1.2)
Prothrombin Time: 29.3 seconds — ABNORMAL HIGH (ref 11.4–15.2)

## 2020-05-25 MED ORDER — FUROSEMIDE 20 MG PO TABS: 40.0000 mg | ORAL_TABLET | Freq: Every day | ORAL | 0 refills | Status: DC

## 2020-05-25 MED ORDER — POTASSIUM CHLORIDE CRYS ER 20 MEQ PO TBCR
40.0000 meq | EXTENDED_RELEASE_TABLET | Freq: Once | ORAL | Status: AC
  Administered 2020-05-25: 40 meq via ORAL
  Filled 2020-05-25: qty 2

## 2020-05-25 MED ORDER — FUROSEMIDE 10 MG/ML IJ SOLN
60.0000 mg | Freq: Once | INTRAMUSCULAR | Status: AC
  Administered 2020-05-25: 60 mg via INTRAVENOUS
  Filled 2020-05-25: qty 6

## 2020-05-25 MED ORDER — WARFARIN SODIUM 3 MG PO TABS
3.0000 mg | ORAL_TABLET | Freq: Once | ORAL | Status: AC
  Administered 2020-05-25: 3 mg via ORAL
  Filled 2020-05-25: qty 1

## 2020-05-25 NOTE — Plan of Care (Signed)

## 2020-05-25 NOTE — Progress Notes (Signed)
Internal Medicine Attending:   I saw and examined the patient. I reviewed the resident's note and I agree with the resident's findings and plan as documented in the resident's note.  Patient states that he feels better today and that his lower extremity swelling is improving after IV diuresis yesterday.  He also states that the Flexeril has been helpful for his back pain.  Patient was initially made to the hospital with acute encephalopathy secondary to hypoxic and hypercarbic respiratory failure.  Patient is back at his baseline mental status and O2 sats are in the mid 90s on 2 L nasal cannula (home O2 dose).  We will continue with bronchodilators, oral prednisone 20 mg (for his sarcoidosis) and complete his course of antibiotics for possible underlying pneumonia.  Will give an additional dose of IV Lasix today for mild lower extremity swelling.  Patient to resume oral Lasix 40 mg daily tomorrow.  No further work-up at this time.  Patient stable for DC home today.  Patient follow-up with his pulmonologist in a.m. tomorrow.

## 2020-05-25 NOTE — Discharge Instructions (Signed)
You were admitted to the hospital with worsening shortness of breath as well as altered mental status. You were continued on your home steroid, given IV lasix and antibiotics with improvement in your mental status and in your breathing. You initially required 4L of oxygen but that decreased to your home oxygen level of 2L by day of discharge. You also had insignificant upper back pain related to your recent kyphoplasty. That was treated with toradol and flexeril with improvement in your pain. You should follow up with your pulmonologist tomorrow. Please follow up with Dr. Vernard Gambles as previously scheduled. You should take 40 mg of your lasix daily until instructed to decrease by your pulmonologist. It was a pleasure to meet you.

## 2020-05-25 NOTE — Progress Notes (Addendum)
Subjective:   Patient sitting up in chair in no acute distress. Patient reports he feels better of the IV diuresis. He reports improvement in his leg swelling but states he has continued swelling in his abdomen. He reports the flexeril has been helpful for his back pain and spasms. We discussed that we would give him more diuretics today.  Objective:  Vital signs in last 24 hours: Vitals:   05/24/20 2333 05/25/20 0341 05/25/20 0459 05/25/20 0805  BP: (!) 148/115 (!) 148/95  136/85  Pulse: 78 85  84  Resp: _0 Temp: 97.6 F (36.4 C) 97.6 F (36.4 C)  97.6 F (36.4 C)  TempSrc: Oral Oral  Oral  SpO2: 98% 100%  100%  Weight:   97.7 kg   Height:       General: chronically ill appearing, in NAD Cardiac: RRR, no m/r/g Pulm: breathing comfortably on 2L [place]. Able to speak full sentences. Mildly labored breathing.  Neuro: a/o x3, moves all extremities   Intake/Output Summary (Last 24 hours) at 05/25/2020 1025 Last data filed at 05/25/2020 0810 Gross per 24 hour  Intake 1317 ml  Output 1850 ml  Net -533 ml   Results for orders placed or performed during the hospital encounter of 05/20/20 (from the past 24 hour(s))  Protime-INR     Status: Abnormal   Collection Time: 05/25/20  1:48 AM  Result Value Ref Range   Prothrombin Time 29.3 (H) 11.4 - 15.2 seconds   INR 2.9 (H) 0.8 - 1.2  Basic metabolic panel     Status: Abnormal   Collection Time: 05/25/20  7:50 AM  Result Value Ref Range   Sodium 141 135 - 145 mmol/L   Potassium 3.3 (L) 3.5 - 5.1 mmol/L   Chloride 96 (L) 98 - 111 mmol/L   CO2 34 (H) 22 - 32 mmol/L   Glucose, Bld 101 (H) 70 - 99 mg/dL   BUN 18 8 - 23 mg/dL   Creatinine, Ser 0.70 0.61 - 1.24 mg/dL   Calcium 8.9 8.9 - 10.3 mg/dL   GFR calc non Af Amer >60 >60 mL/min   GFR calc Af Amer >60 >60 mL/min   Anion gap 11 5 - 15   No results found.  Assessment/Plan:  Principal Problem:   Respiratory failure with hypoxia and hypercapnia (HCC) Active Problems:  Pulmonary embolism on long-term anticoagulation therapy (HCC)   Sarcoidosis   History of DVT (deep vein thrombosis)   Hypertension   Osteoporosis   Right heart failure [facility])  This is a 69 year old male with a history of sarcoidosis, on 2L supplemental oxygen and chronic corticosteroids, hypertension, recent DVT and PE secondary to medication noncompliance February 2021, osteoporosis, recent history of COVID-19 in January 2021, and OSA who presented with altered mental status, hallucinations, and dyspnea.  Acute encephalopathy: Hypoxic and hypercarbic respiratory failure: Acute encephalopathy was likely secondary to his hypercapnia, however the cause for his hypoxic and hypercarbic respiratory failure appears unknown.  He was given antibiotics for possible infection, white count was elevated however is on chronic steroids at home. RVP was negative. Diuresed for mild fluid overloaded.  Has right sided heart failure and edematous lower extremities. Overall his hypercarbia is not totally consistent with pulmonary sarcoidosis and he has no history of severe COPD or other causes of CO2 retention.  He does have a history of right heart failure however this should not cause the hypoxia that we are seeing. Would not explain increased dyspnea. We  continued to treat for possible pneumonia.  -Pulm consulted and recommended continued oxygen supplementation, continued bronchodilators, continued steroids; agreed with short-term antibiotics and gentle diuresis; believed patient could have underlying sleep disorder and recommends pulm follow up outpatient -pulm recommended IV lasix yesterday and pt net negative 750 mls; K low today at 750 and repleted with oral supplementation -on exam patient satting 100% on 2L, RR 20, no crackles in his lungs  -40 mg IV lasix today -continue augmentin -Continue supplemental O2 -continue bronchodilators and steroids -discharge pending pulmonology's recs today  Right heart  failure: Echo on 02/16/2020 demonstrated an LVEF of 50-55% but with a markedly dilated RV and severely reduced RV function.  Noted to have bilateral 2+ pitting  edema on exam during admission and has received 2 days of 40 mg IV lasix. Echo demonstrates elevated RV pressure and volume overload as well as severely reduced right ventricular systolic function consistent with cor pulmonale   -IV diuresis as above -Continue strict I's and O's and daily weights -Monitor vitals  Stage IV sarcoidosis: Follows with Dr. Shearon Stalls for this, he was unable to complete the corticosteroid taper over the past few months, currently on prednisone 20 mg daily.  Did not follow-up in January as he was admitted in the hospital for Covid infection.  On chart review, it appears that his disease has progressed, with high probability of significant pulmonary fibrosis.  We will consult pulmonology for assistance with management of his sarcoidosis and prednisone recommendations. -Pulm consulted and recommended oxygen supplementation, bronchodilators, steroids; did not believe sarcoid contributing to this acute process -fu with outpatient pulm  History of PE and DVT: -Most recent one was in February 2021.  Currently on warfarin at home.  INR is therapeutic so PE is unlikely.  Continue warfarin per pharmacy.  Chronic back pn s/p kyphoplasty: Patient complaining of significant back pain which he says interferes with his breathing; pain improved w/ toradol and flexeril -d/c with toradol and flexeril and fu w/ Dr. Vernard Gambles  Prior to Admission Living Arrangement: Home Anticipated Discharge Location: Home Barriers to Discharge: Continued medical management Dispo: Anticipated discharge today or tomorrow.  Al Decant, MD 05/25/2020, 10:25 AM Pager: [phone]

## 2020-05-25 NOTE — Progress Notes (Signed)
ANTICOAGULATION CONSULT NOTE  Pharmacy Consult for warfarin Indication: hx VTE Feb 2021  No Known Allergies  Patient Measurements: Height: 5' 9" (175.3 cm) Weight: 97.7 kg (215 lb 6.2 oz) IBW/kg (Calculated) : 70.7  Vital Signs: Temp: 97.7 F (36.5 C) (06/02 1047) Temp Source: Oral (06/02 1047) BP: 148/91 (06/02 1047) Pulse Rate: 68 (06/02 1047)  Labs: Recent Labs    05/23/20 0235 05/24/20 0250 05/25/20 0148 05/25/20 0750  LABPROT 26.2* 27.1* 29.3*  --   INR 2.5* 2.6* 2.9*  --   CREATININE  --   --   --  0.70    Estimated Creatinine Clearance: 104.7 mL/min (by C-G formula based on SCr of 0.7 mg/dL).   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    Assessment: 38 yom presented to the ED with AMS and SOB. He is on chronic warfarin for history of VTE. INR therapeutic at 2.9 from 2.6. Eating 100% of meals. Possible discharge today.  PTA regimen: 50m on Mon + Thurs, 441mall other days  Goal of Therapy:  INR 2-3 Monitor platelets by anticoagulation protocol: Yes   Plan:  -Warfarin 66m26mO x1 tonight -Daily protime - If discharged, resume home dose of 6mg70mn/Thur, 4mg 89m other days   LydiaBenetta SparrmD, BCPS, BCCP Amsc LLCical Pharmacist  Please check AMION for all [facility]  numbers After 10:00 PM, call [facility] [place]

## 2020-05-25 NOTE — Progress Notes (Signed)
NAME:  Jesus Russell, MRN:  [id number], DOB:  [birth year], LOS: 5 ADMISSION DATE:  05/20/2020, CONSULTATION DATE: 05/21/2020 REFERRING MD: Dr. Rebeca Alert, CHIEF COMPLAINT: Sarcoidosis  Brief History   Patient with a history of sarcoidosis was admitted with shortness of breath ? 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 Shearon Stalls once last year, November 2020 and was supposed to follow-up June 1    Past Medical History   Past Medical History:  Diagnosis Date  . Acute on chronic respiratory failure with hypoxia ([place])   . COVID-19 virus infection   . DVT of lower extremity, bilateral ([place])   . Epistaxis   . Hypertension   . Incarcerated umbilical hernia [id number]/4734   Status post repair  . Lupus anticoagulant positive    Per records from [facility]  . 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 Pulmonary artery hypertension ([place]) 04/2011   Mildly elevated pulmonary artery pressure in setting of PE (from [facility])  . Pulmonary embolism associated with COVID-19 ([place])   . Subdural hematoma (HCC)    Remote episode, occurred when taking excedrin and warfarin.      Significant Hospital Events   Appears to be improving  Consults:  Pulmonary  Procedures:  None  Significant Diagnostic Tests:  CT head IMPRESSION: 1. No CT evidence for acute intracranial abnormality. 2. Prior left craniotomy  CTa chest 02/14/20 IMPRESSION: 1. Study is positive for pulmonary embolism with a combination of occlusive and nonocclusive embolus in the lower lungs bilaterally ranging from  segmental to subsegmental sized vessels. 2. Aortic atherosclerosis, in addition to left main and 3 vessel coronary artery disease. Please note that although the presence of coronary artery calcium documents the presence of coronary artery disease, the severity of this disease and any potential stenosis cannot be assessed on this non-gated CT examination. Assessment for potential risk factor modification, dietary therapy or pharmacologic therapy may be warranted, if clinically indicated. 3. There are calcifications of the aortic valve. Echocardiographic correlation for evaluation of potential valvular dysfunction may be warranted if clinically indicated. 4. Imaging findings in the lungs compatible with reported clinical history of sarcoidosis, as above. Echo 05/21/20  Severe RV dysfunction   Micro Data:  5/29 MRSA PCR negative Urine culture 5/28> 20 k ecoli Blood cultures 5/28 > NGTD    Scheduled Meds: . ascorbic acid  500 mg Oral Daily  . chlorhexidine  15 mL Mouth Rinse BID  . cholecalciferol  1,000 Units Oral Daily  . famotidine  20 mg Oral BID  . fluticasone furoate-vilanterol  1 puff Inhalation Daily  . furosemide  40 mg Oral Daily  . ketorolac  10 mg Oral Q8H  . lidocaine  1 patch Transdermal Q24H  . mouth rinse  15 mL Mouth Rinse q12n4p  . predniSONE  20 mg Oral Q breakfast  . Warfarin - Pharmacist Dosing Inpatient   Does not apply q1600  . zinc sulfate  220 mg Oral Daily   Continuous Infusions:  PRN Meds:.acetaminophen **OR** acetaminophen, albuterol, bisacodyl, cyclobenzaprine, polyethylene glycol  Interim history/subjective:  Elderly male  sitting in chair noted to be kyphotic obese but less short of breath.  Per nursing staff reported to have some ventricular arrhythmias during the night with no current potassium levels available.  Objective   Blood pressure 136/85, pulse 84, temperature 97.6 F (36.4 C), temperature source Oral, resp. rate 20, height _0  (1.753  m), weight 97.7 kg, SpO2 100 %.        Intake/Output Summary (Last 24 hours) at 05/25/2020 0839 Last data filed at 05/25/2020 0810 Gross per 24 hour  Intake 1317 ml  Output 1850 ml  Net -533 ml   Filed Weights   05/23/20 0622 05/24/20 0406 05/25/20 0459  Weight: 98.1 kg 98.2 kg 97.7 kg    Examination: General: Elderly male who is noted to be obese and kyphotic HEENT: No JVD or lymphadenopathy is appreciated Neuro: Grossly intact CV: Heart sounds are regular PULM: Decreased breath sounds throughout 2 L nasal cannula sats of 94%:  GI-soft, bsx4 active  GU: Voiding Extremities: warm/dry, decreased to 1+ edema  Skin: no rashes or lesions     Resolved Hospital Problem list     Assessment & Plan:  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  Appears well compensated off BiPAP BiPAP may help with his diuresis Currently on 2 L nasal cannula with improved pulmonary mechanics Note he has a component of mechanical restriction secondary to kyphosis  Reported ventricular arrhythmia Check potassium If further arrhythmias check twelve-lead     Sarcoidosis Stage IV sarcoid Continue prednisone 20 mg daily  Obstructive lung disease with wheezing resolved  Continue bronchodilators Follow-up with pulmonary MD as per arrangement  History of thromboembolic disease Lab Results  Component Value Date   INR 2.9 (H) 05/25/2020   INR 2.6 (H) 05/24/2020   INR 2.5 (H) 05/23/2020    Continue current dosing of Coumadin per pharmacy     Cor pulmonale  Intake/Output Summary (Last 24 hours) at 05/25/2020 0839 Last data filed at 05/25/2020 0810 Gross per 24 hour  Intake 1317 ml  Output 1850 ml  Net -533 ml   Filed Weights   05/23/20 0622 05/24/20 0406 05/25/20 0459  Weight: 98.1 kg 98.2 kg 97.7 kg    05/25/2020 noted to be negative none with use of IV Lasix Weight is noted to be down     Richardson Landry Lakecia Deschamps ACNP Acute Care Nurse Practitioner [place] Please consult Amion 05/25/2020, 8:39 AM

## 2020-05-25 NOTE — Plan of Care (Addendum)
Spoke with patient, [name], at length today regarding his discharge.  He was requesting to stay another day.   He was originally planned for discharge 2 days prior but he was unable to return home with a prescription of oxycodone as he is currently living in a halfway house.  He stated he was in too much pain to go home and we decided to keep him to determine a sufficient alternative pain regimen.  We stopped oxycodone and started Flexeril for the muscle spasms causing pain in his back with great improvement in his pain.    Today, we discussed that his presenting symptoms of hallucinations, altered mental status and dyspnea had improved.  He is back to his baseline mentally.  He does report some continued shortness of breath.  He has significant shortness of breath at baseline from his stage IV sarcoidosis.  He reports that it is increased from his baseline, however, whereas he was originally requiring an increase in his home O2 from 2 L to 4 L on admission, he is now back to his home O2 of 2 L and satting in the mid 90s to 100%.  While he was here we treated him for pneumonia as a possible cause of his dyspnea. He has completed his antibiotic course. We consulted pulmonology who recommended continuation of his home steroid and oxygen as well as gentle diuresis.  He has been diuresed with IV Lasix with improvement in his lower extremity edema. He has no fluid in his lungs on exam.  We plan to increase his home Lasix dose to 40 mg p.o. outpatient.  It is our assessment and the assessment of my supervising physician, Dr. Dareen Piano, that this patient is medically stable for discharge.  Unfortunately, the patient continues to disagree. He cites pain from his right heart strain.  We discussed that right heart strain and failure does not typically cause pain. He did have a recent kyphoplasty and we have been treating his pain with Toradol, oxycodone and Flexeril.  We are discharging him with Toradol and Flexeril  which has been controlling his pain here.    He reports continued swelling in his lower extremities.  We explained that his right-sided heart failure can cause lower extremity swelling but that swelling from right-sided heart failure does not typically cause increase shortness of breath.    He reports that he spoke to his facility and they are unable to take him today.  We spoke with with his facility who reports they are able to take him today.  We discussed his stay and the appropriateness of his discharge.  She reports he has had frequent hospitalizations since his COVID-19 infection in January.  We discussed that he is back to his home oxygen, has completed an antibiotic course for pneumonia, is eating and drinking normally and is able to walk around the room and go to the bathroom on his own.  We discussed that we had made an appointment for him with his pulmonologist tomorrow morning at 11:30 AM we discussed that following up with his outpatient pulmonologist was going to be of the utmost importance for him.  She was in agreement with this plan and reported they would come to pick him up this afternoon.  Unfortunately, the patient stated he would like to file a complaint against me and asked me for my name which I provided him.  Al Decant, MD 05/25/2020, 2:13 PM Pager: [phone]

## 2020-05-25 NOTE — Progress Notes (Signed)
Discharge instructions given to patient, follow up appointments and medication changes given to patient. Patient discharged with representative from [facility]. Patient to follow up with pulmonologist in the a.m.

## 2020-05-26 ENCOUNTER — Ambulatory Visit (INDEPENDENT_AMBULATORY_CARE_PROVIDER_SITE_OTHER): Payer: Medicare Other | Admitting: Internal Medicine

## 2020-05-26 ENCOUNTER — Other Ambulatory Visit: Payer: Self-pay

## 2020-05-26 ENCOUNTER — Telehealth: Payer: Self-pay | Admitting: *Deleted

## 2020-05-26 ENCOUNTER — Encounter: Payer: Self-pay | Admitting: Internal Medicine

## 2020-05-26 VITALS — BP 130/74 | HR 96 | Temp 97.5°F | Ht 69.0 in | Wt 214.5 lb

## 2020-05-26 DIAGNOSIS — G4733 Obstructive sleep apnea (adult) (pediatric): Secondary | ICD-10-CM

## 2020-05-26 DIAGNOSIS — I2602 Saddle embolus of pulmonary artery with acute cor pulmonale: Secondary | ICD-10-CM | POA: Diagnosis not present

## 2020-05-26 DIAGNOSIS — D860 Sarcoidosis of lung: Secondary | ICD-10-CM

## 2020-05-26 DIAGNOSIS — J9611 Chronic respiratory failure with hypoxia: Secondary | ICD-10-CM

## 2020-05-26 DIAGNOSIS — Z87891 Personal history of nicotine dependence: Secondary | ICD-10-CM

## 2020-05-26 DIAGNOSIS — I50810 Right heart failure, unspecified: Secondary | ICD-10-CM | POA: Diagnosis not present

## 2020-05-26 MED ORDER — PREDNISONE 5 MG PO TABS: ORAL_TABLET | ORAL | 0 refills | Status: AC

## 2020-05-26 MED ORDER — FUROSEMIDE 40 MG PO TABS: 40.0000 mg | ORAL_TABLET | Freq: Two times a day (BID) | ORAL | 5 refills | Status: DC

## 2020-05-26 NOTE — Progress Notes (Signed)
Jesus Russell    [id number]    1951/11/27  Primary Care Physician:MacLean, Rodman Key, MD Date of Appointment: 05/26/2020 Established Patient Visit  Chief complaint:   Chief Complaint  Patient presents with  . Hospitalization Follow-up    Pt was recently in the hospital 5/28-6/2 due to respiratory failure.  Pt states that he has been having a lot of problems with edema. Pt also states he has had worsening SOB.    HPI: Jesus Russell is a 69 y.o. gentleman. Diagnosed with Sarcoidosis in the 1990 range when he was going for a hernia surgery.  Was seen by a pulmonologist and had bronchoscopy with biopsies. He was initially asymptomatic.  Also has 35 pack year smoking history (quit in 1990s,) and significant drug use in the past. Additional history of Lupus Anticoagulant positive on long term anticoagulation with warfarin.   First saw me in Nov 2020. Had [facility] in Jan 2021. Since then interval hospitalization in Feb 2021 for compression fractures of his spine. Discontinued warfarin later in Feb for worsening pain and taking naproxen. He had recurrent Acute PE at that time. After starting heparin gtt had massive epistaxis and required emergent intubation. Developed obstructive shock and required ENT surgical cautery for his epistaxis. Was eventually discharged on lovenox for his acute PE. Was resumed on warfarin.   Interval Updates: Hospitalized yet again in end of May 2021 (discharged yesterday) for hypoxemic and hypercapnic respiratory failure.) Felt to be secondary to underlying sleep disorder and heart failure. Improved with diuresis. Currently on 20 mg prednisone daily for his sarcoidosis. Taking lasix 40 mg po daily. Not sure it's helping.   OBSTRUCTIVE SLEEP APNEA SCREENING  1.  Snoring?:  yes 2.  Tired?:  yes 3.  Observed apnea, stop breathing or choking/gasping during sleep?:  no 4.  Pressure. HTN history?  yes 5.  BMI more than 35 kg/m2?  no 6.  Age more than 83 yrs?   yes 7.  Neck size larger than 17 in for male or 16 in for male?  no 8.  Gender = Male?  yes  Total:  5  For general population  OSA - Low Risk : Yes to 0 - 2 questions OSA - Intermediate Risk : Yes to 3 - 4 questions OSA - High Risk : Yes to 5 - 8 questions  or Yes to 2 or more of 4 STOP questions + male gender or Yes to 2 or more of 4 STOP questions + BMI > 35kg/m2  or Yes to 2 or more of 4 STOP questions + neck circumference 17 inches / 43cm in male or 16 inches / 41cm in male  References: Rinaldo Cloud al. Anesthesiology 2008; 108: 812-821,  Gabriel Cirri et al Br Dara Hoyer 2012; 108: 093-818,  Gabriel Cirri et al J Clin Sleep Med Sept 2014.  I have reviewed the patient's family social and past medical history and updated as appropriate.   Past Medical History:  Diagnosis Date  . Acute on chronic respiratory failure with hypoxia ([place])   . COVID-19 virus infection   . DVT of lower extremity, bilateral ([place])   . Epistaxis   . Hypertension   . Incarcerated umbilical hernia [id number]/9371   Status post repair  . Lupus anticoagulant positive    Per records from [facility]  . 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  Pulmonary artery hypertension ([place]) 04/2011   Mildly elevated pulmonary artery pressure in setting of PE (from [facility])  . Pulmonary embolism associated with COVID-19 ([place])   . Subdural hematoma (HCC)    Remote episode, occurred when taking excedrin and warfarin.     Past Surgical History:  Procedure Laterality Date  . BRAIN SURGERY    . HERNIA REPAIR    . IR KYPHO EA ADDL LEVEL THORACIC OR LUMBAR  05/04/2020  . IR KYPHO EA ADDL LEVEL THORACIC OR LUMBAR  05/04/2020  . IR KYPHO THORACIC WITH BONE BIOPSY  05/04/2020  . IR RADIOLOGIST EVAL & MGMT  04/07/2020  . NASAL ENDOSCOPY WITH EPISTAXIS CONTROL N/A 02/24/2020   Procedure: NASAL ENDOSCOPY WITH EPISTAXIS CONTROL;  Surgeon: [name], MD;  Location: [place];   Service: ENT;  Laterality: N/A;    Family History  Adopted: Yes    Social History   Occupational History  . Occupation: retired Pharmacist, hospital  Tobacco Use  . Smoking status: Former Smoker    Packs/day: 1.50    Years: 23.00    Pack years: 34.50    Types: Cigarettes    Quit date: 1995    Years since quitting: 26.4  . Smokeless tobacco: Never Used  Substance and Sexual Activity  . Alcohol use: Never  . Drug use: Not Currently    Types: "Crack" cocaine    Comment: quit 2008  . Sexual activity: Not on file     Physical Exam: Blood pressure 130/74, pulse 96, temperature (!) 97.5 F (36.4 C), temperature source Oral, height _0  (1.753 m), weight 214 lb 8 oz (97.3 kg), SpO2 91 %.  Gen:      No acute distress, appears chronically ill.  Lungs:    No increased respiratory effort, symmetric chest wall excursion, clear to auscultation bilaterally, no wheezes or crackles CV:         Regular rate and rhythm; no murmurs, rubs, or gallops.  2+ pitting edema Abd:        Distended, soft.   Data Reviewed: Imaging: I have personally reviewed the CT Chest Feb 2021 shows old burned out sarcoidosis   PFTs: PFTs from previous records show: PFTs (04/26/2016): Findings(summarized): *Moderately reduced FVC 2.15 L, 47% of predicted. After ministration of albuterol, the FVC is 1.97 L, 43% predicted *Reduced FEV1/FVC of 66%. *Reduced FEV1 1.43 L, 45% predicted. Impression:There is moderate combined obstructive/restrictive ventilatory defect. There is no clear improvement with administration of a bronchodilator. There is relative residual hyperinflation. The reduced ERV is likely due to body habitus. The diffusing capacity is mildly reduced, but normalizes after correcting for alveolar volume.  PFTs (04/02/2018): Findings (summarized): *FVC is moderately reduced, 2.01 L, 49% predicted *FEV1 moderately reduced, 1.41 L, 49% predicted *FEV1/FVC is normal, 70% *After bronchodilator the FVC  is 58% predicted,19% increase, FEV1 is 51% predicted, FEV1/FVC mildly reduced, 61%.   Labs: INR 2.9 yesterday  Echocardiogram May 2021  1. Left ventricular ejection fraction, by estimation, is 55 to 60%. The  left ventricle has normal function. The left ventricle has no regional  wall motion abnormalities. Left ventricular diastolic parameters are  consistent with Grade I diastolic  dysfunction (impaired relaxation). There is the interventricular septum is  flattened in systole and diastole, consistent with right ventricular  pressure and volume overload.  2. Right ventricular systolic function is severely reduced. The right  ventricular size is severely enlarged. Tricuspid regurgitation signal is  inadequate for assessing PA pressure.  3. The mitral  valve is grossly normal. No evidence of mitral valve  regurgitation. No evidence of mitral stenosis.  4. The aortic valve is tricuspid. Aortic valve regurgitation is not  visualized. Mild to moderate aortic valve sclerosis/calcification is  present, without any evidence of aortic stenosis.  5. The inferior vena cava is dilated in size with <50% respiratory  variability, suggesting right atrial pressure of 15 mmHg.    Immunization status: Immunization History  Administered Date(s) Administered  . Influenza,inj,Quad PF,6+ Mos 09/09/2019  . 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  Decompensated Heart Failure preserved ejection fraction Chronic hypoxemic respiratory failure - on [facility] Sleep disordered breathing  Plan/Recommendations: Mr. Osment has a complicated medical history.  His sarcoidosis is not active in his lungs based on his most recent CT Chest.  I doubt these bilateral fibrotic changes will be responsive to steroids. He is on prednisone 20 mg daily for this old burned out sarcoidosis. It needs to be tapered as his  fluid retention will only worsen with high dose prednisone.  I have prescribed a taper over the next 15 days.  He can continue ICS-LABA for suspected smoking related lung disease. His bigger issue is his decompensated HFpEF. Will increase lasix 40 mg from daily to BID.  Refer to cardiology for acute on chronic RV failure - suspect this is secondary to his acute on chronic PE. He is at high risk for development of CTEPH.   Will older Split night study for evaluation of sleep disordered breathing as he has suspected OSA.   I spent 45 minutes on 05/26/2020 in care of this patient including face to face time and non-face to face time spent charting, review of outside records, and coordination of care.  Return to Care: Keep Appointment with Derl Barrow which is already scheduled.   Lenice Llamas, MD Pulmonary and [place]

## 2020-05-26 NOTE — Patient Instructions (Addendum)
Prednisone taper: 15 mg daily starting tomorrow for 5 days 10 mg daily for 5 days 5 mg daily for 5 days.  Follow up with coumadin clinic for your warfarin adjustments as needed.   Lasix will increase to 40 mg in the morning, 40 mg in the afternoon.  I am sending you to a cardiologist for your heart failure.  We are ordering a sleep study to help evaluate for sleep apnea.   Keep taking the inhalers for your breathing.

## 2020-05-26 NOTE — Telephone Encounter (Signed)
Call to [facility] for Lidocaine 5% Patches and Ketorolac 10 mg tablets.  Information was given.  Awaiting determination via fax.  Sander Nephew, RN 05/26/2020 12:10 PM

## 2020-05-30 ENCOUNTER — Ambulatory Visit: Payer: Medicare Other

## 2020-05-31 ENCOUNTER — Ambulatory Visit (INDEPENDENT_AMBULATORY_CARE_PROVIDER_SITE_OTHER): Payer: Medicare Other | Admitting: Pharmacist

## 2020-05-31 DIAGNOSIS — Z7901 Long term (current) use of anticoagulants: Secondary | ICD-10-CM

## 2020-05-31 DIAGNOSIS — I2699 Other pulmonary embolism without acute cor pulmonale: Secondary | ICD-10-CM

## 2020-05-31 DIAGNOSIS — Z86711 Personal history of pulmonary embolism: Secondary | ICD-10-CM

## 2020-05-31 DIAGNOSIS — Z86718 Personal history of other venous thrombosis and embolism: Secondary | ICD-10-CM | POA: Diagnosis not present

## 2020-05-31 DIAGNOSIS — T45515A Adverse effect of anticoagulants, initial encounter: Secondary | ICD-10-CM | POA: Diagnosis not present

## 2020-05-31 LAB — POCT INR: INR: 3.4 — AB (ref 2.0–3.0)

## 2020-05-31 NOTE — Progress Notes (Signed)
Anticoagulation Management Jesus Russell is a 69 y.o. male who reports to the clinic for monitoring of warfarin treatment.    Indication: PE , History of (resolved); History of DVT (resolved); Long term current use of anticoagulant, warfarin with INR goal 2.0 - 3.0.   Duration: indefinite Supervising physician: Joni Reining  Anticoagulation Clinic Visit History: Patient  Does  NOT report signs/symptoms of bleeding or thromboembolism  Other recent changes: No diet, medications, lifestyle changes endorsed by the patient at this visit--EXCEPT as noted in patient findings.  Anticoagulation Episode Summary    Current INR goal:  2.0-3.0  TTR:  64.4 % (6.8 mo)  Next INR check:  06/20/2020  INR from last check:  3.4 (05/31/2020)  Weekly max warfarin dose:    Target end date:  Indefinite  INR check location:    Preferred lab:    Send INR reminders to:     Indications   Long term (current) use of anticoagulants [Z79.01] History of DVT (deep vein thrombosis) [Z86.718] Pulmonary embolism on long-term anticoagulation therapy (HCC) [I26.99 T45.515A] History of pulmonary embolism [Z86.711]       Comments:          No Known Allergies  Current Outpatient Medications:  .  alendronate (FOSAMAX) 70 MG tablet, Take 1 tablet (70 mg total) by mouth every 7 (seven) days. Take with a full glass of water on an empty stomach., Disp: 12 tablet, Rfl: 3 .  amLODipine (NORVASC) 5 MG tablet, Take 1 tablet (5 mg total) by mouth daily., Disp: 30 tablet, Rfl: 11 .  Ascorbic Acid (VITAMIN C) 500 MG CAPS, Take 500 mg by mouth daily., Disp: , Rfl:  .  BREO ELLIPTA 200-25 MCG/INH AEPB, INHALE 1 PUFF INTO THE LUNGS DAILY (Patient taking differently: Inhale 1 puff into the lungs daily. ), Disp: 60 each, Rfl: 2 .  cholecalciferol (VITAMIN D3) 25 MCG (1000 UT) tablet, Take 1,000 Units by mouth daily., Disp: , Rfl:  .  furosemide (LASIX) 20 MG tablet, Take 2 tablets (40 mg total) by mouth daily. TAKE 1 TABLET(20 MG) BY  MOUTH DAILY AS NEEDED FOR FLUID RETENTION OR SWELLING, Disp: 90 tablet, Rfl: 0 .  furosemide (LASIX) 40 MG tablet, Take 1 tablet (40 mg total) by mouth 2 (two) times daily., Disp: 60 tablet, Rfl: 5 .  lidocaine (LIDODERM) 5 %, Place 1 patch onto the skin daily. Remove & Discard patch within 12 hours or as directed by MD, Disp: 30 patch, Rfl: 0 .  losartan-hydrochlorothiazide (HYZAAR) 50-12.5 MG tablet, Take 1 tablet by mouth daily., Disp: , Rfl:  .  omega-3 acid ethyl esters (LOVAZA) 1 g capsule, Take 1 capsule (1 g total) by mouth 2 (two) times daily. (Patient taking differently: Take 1 g by mouth daily. ), Disp: 30 capsule, Rfl: 3 .  Potassium Chloride ER 20 MEQ TBCR, TAKE 1 TABLET BY MOUTH DAILY (Patient taking differently: Take 20 mEq by mouth daily. ), Disp: 90 tablet, Rfl: 0 .  predniSONE (DELTASONE) 5 MG tablet, Take 3 tablets (15 mg total) by mouth daily with breakfast for 5 days, THEN 2 tablets (10 mg total) daily with breakfast for 5 days, THEN 1 tablet (5 mg total) daily with breakfast for 5 days., Disp: 30 tablet, Rfl: 0 .  warfarin (COUMADIN) 4 MG tablet, Take one-and-one-half (1 & 1/2)  tablets on Mondays and Thursdays. All other day, take one (1) tablet, Disp: 96 tablet, Rfl: 1 .  zinc gluconate 50 MG tablet, Take 50 mg  by mouth daily., Disp: , Rfl:  .  cyclobenzaprine (FLEXERIL) 5 MG tablet, Take 1 tablet (5 mg total) by mouth 3 (three) times daily as needed for up to 10 days for muscle spasms. (Patient not taking: Reported on 05/31/2020), Disp: 15 tablet, Rfl: 0 .  famotidine (PEPCID) 20 MG tablet, Take 1 tablet (20 mg total) by mouth 2 (two) times daily. (Patient not taking: Reported on 05/31/2020), Disp: 180 tablet, Rfl: 1 .  ketorolac (TORADOL) 10 MG tablet, Take 1 tablet (10 mg total) by mouth every 8 (eight) hours. (Patient not taking: Reported on 05/26/2020), Disp: 20 tablet, Rfl: 0 Past Medical History:  Diagnosis Date  . Acute on chronic respiratory failure with hypoxia ([place])   .  COVID-19 virus infection   . DVT of lower extremity, bilateral ([place])   . Epistaxis   . Hypertension   . Incarcerated umbilical hernia [id number]/8101   Status post repair  . Lupus anticoagulant positive    Per records from [facility]  . 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 Pulmonary artery hypertension ([place]) 04/2011   Mildly elevated pulmonary artery pressure in setting of PE (from [facility])  . Pulmonary embolism associated with COVID-19 ([place])   . Subdural hematoma (HCC)    Remote episode, occurred when taking excedrin and warfarin.    Social History   Socioeconomic History  . Marital status: Divorced    Spouse name: Not on file  . Number of children: Not on file  . Years of education: Not on file  . Highest education level: Not on file  Occupational History  . Occupation: retired Pharmacist, hospital  Tobacco Use  . Smoking status: Former Smoker    Packs/day: 1.50    Years: 23.00    Pack years: 34.50    Types: Cigarettes    Quit date: 1995    Years since quitting: 26.4  . Smokeless tobacco: Never Used  Substance and Sexual Activity  . Alcohol use: Never  . Drug use: Not Currently    Types: "Crack" cocaine    Comment: quit 2008  . Sexual activity: Not on file  Other Topics Concern  . Not on file  Social History Narrative  . Not on file   Social Determinants of Health   Financial Resource Strain: Low Risk   . Difficulty of Paying Living Expenses: Not very hard  Food Insecurity:   . Worried About Charity fundraiser in the Last Year:   . Arboriculturist in the Last Year:   Transportation Needs:   . Film/video editor (Medical):   Marland Kitchen Lack of Transportation (Non-Medical):   Physical Activity:   . Days of Exercise per Week:   . Minutes of Exercise per Session:   Stress:   . Feeling of Stress :   Social Connections:   . Frequency of Communication with Friends and Family:   . Frequency of Social Gatherings  with Friends and Family:   . Attends Religious Services:   . Active Member of Clubs or Organizations:   . Attends Archivist Meetings:   Marland Kitchen Marital Status:    Family History  Adopted: Yes    ASSESSMENT Recent Results: The most recent result is correlated with 32 mg per week: Lab Results  Component Value Date   INR 3.4 (A) 05/31/2020   INR 2.9 (H) 05/25/2020   INR 2.6 (H) 05/24/2020  Anticoagulation Dosing: Description   Take one-and-one-half (1&1/2) of your 90m strength-blue warfarin tablets [facility] All other days, take only one (1) of your 423mstrength-blue warfarin tablets at 6PM.     INR today: Supratherapeutic  PLAN Weekly dose was decreased by 6% to 30 mg per week  Patient Instructions  Patient instructed to take medications as defined in the Anti-coagulation Track section of this encounter.  Patient instructed to take today's dose.  Patient instructed to take one-and-one-half (1&1/2) of your 66m13mtrength-blue warfarin tablets [facility] other days, take only one (1) of your 66mg19mrength-blue warfarin tablets at [facility] verbal[facility] understanding of these instructions.    Patient advised to contact clinic or seek medical attention if signs/symptoms of bleeding or thromboembolism occur.  Patient verbalized understanding by repeating back information and was advised to contact me if further medication-related questions arise. Patient was also provided an information handout.  Follow-up Return in 20 days (on 06/20/2020) for Follow up INR.  JamePennie BanterarmD, CPP  15 minutes spent face-to-face with the patient during the encounter. 50% of time spent on education, including signs/sx bleeding and clotting, as well as food and drug interactions with warfarin. 50% of time was spent on fingerprick POC INR sample collection,processing, results determination, and documentation in [email or web address]

## 2020-05-31 NOTE — Patient Instructions (Signed)
Patient instructed to take medications as defined in the Anti-coagulation Track section of this encounter.  Patient instructed to take today's dose.  Patient instructed to take one-and-one-half (1&1/2) of your 67m strength-blue warfarin tablets [facility] All other days, take only one (1) of your 461mstrength-blue warfarin tablets at [facility] verbalized understanding of these instructions.

## 2020-06-02 ENCOUNTER — Other Ambulatory Visit: Payer: Self-pay | Admitting: Radiation Oncology

## 2020-06-02 NOTE — Telephone Encounter (Signed)
Refill Request   cyclobenzaprine (FLEXERIL) 5 MG tablet  ketorolac (TORADOL) 10 MG tablet  [id number] - , [place] [address] AT [address]

## 2020-06-06 ENCOUNTER — Telehealth: Payer: Self-pay | Admitting: Internal Medicine

## 2020-06-06 NOTE — Telephone Encounter (Signed)
I checked chart, and referral has been sent. Pt would like to know contact information for cardiology so they may schedule since they have not heard from cariology.

## 2020-06-06 NOTE — Telephone Encounter (Signed)
Patient can follow up on this referral with cardiology clinic at [phone]. LMTCB X1 for pt to relay this phone number.

## 2020-06-07 NOTE — Telephone Encounter (Signed)
Patient contacted and cardiac appointment has been made.

## 2020-06-08 ENCOUNTER — Ambulatory Visit
Admission: RE | Admit: 2020-06-08 | Discharge: 2020-06-08 | Disposition: A | Discharge status: Home / Self Care | Payer: Medicare Other | Source: Ambulatory Visit | Attending: Interventional Radiology | Admitting: Interventional Radiology

## 2020-06-08 ENCOUNTER — Other Ambulatory Visit (HOSPITAL_COMMUNITY): Payer: Self-pay | Admitting: Interventional Radiology

## 2020-06-08 ENCOUNTER — Other Ambulatory Visit: Payer: Self-pay

## 2020-06-08 ENCOUNTER — Other Ambulatory Visit: Payer: Self-pay | Admitting: Interventional Radiology

## 2020-06-08 ENCOUNTER — Encounter: Payer: Self-pay | Admitting: *Deleted

## 2020-06-08 DIAGNOSIS — S22000A Wedge compression fracture of unspecified thoracic vertebra, initial encounter for closed fracture: Secondary | ICD-10-CM

## 2020-06-08 HISTORY — PX: IR RADIOLOGIST EVAL & MGMT: IMG5224

## 2020-06-08 MED ORDER — DICLOFENAC POTASSIUM 50 MG PO TABS: 50.0000 mg | ORAL_TABLET | Freq: Four times a day (QID) | ORAL | 2 refills | Status: DC | PRN

## 2020-06-08 NOTE — Progress Notes (Signed)
Patient ID: Jesus Russell, male   DOB: [birth year], 69 y.o.   MRN: [id number]       Chief Complaint: Patient was consulted remotely today (TeleHealth) for  follow-up thoracic kyphoplasty at the request of Jamaria Amborn.    Referring Physician(s): MacLean,Matthew    History of Present Illness: Jesus Russell is a 69 y.o. male  with a history of sarcoidosis antiphospholipid antibody syndrome, chronic prednisone use, pulmonary thromboembolic disease, pulmonary arterial hypertension, IVC filter.   12/22/2019 during treatment in [place], a CT chest was obtained which noted thoracic T6 and T8 compression fracture deformities, age indeterminant. He transferred his care to [facility].  He was diagnosed with Covid. 02/14/2020 he presented with shortness of breath and CTA demonstrated pulmonary embolism.  Incidental note also made of progression of the T8 compression fracture deformity as well as new mild compression fracture deformities of thoracic T9 and T10. He hadcontinued midthoracic pain,  primarily manifest when he tries to turn over in the bed at night, not exacerbated by walking or sitting.  He has chronic shortness of breath and felt that the pain was interfering with his desired rehabilitation.  05/04/2020 thoracic kyphoplasty T8, T9, U13 without complication  He states that he has had only partial relief since that procedure.  He has persistent focal pain in his mid thoracic spine which is exacerbated when laying down.  Previously the pain has been all over, so there has been some improvement.  This pain is relieved with sitting and he can stand and walk without additional difficulty.  Pain is exacerbated by coughing and sneezing, although thankfully these are rare.  He rates the pain 7 out of 10 on the visual analog pain scale.  His shortness of breath from sarcoidosis etc. limits his ability to walk long distances.  He is using Tylenol once or twice per day for pain control, reducing the pain  to 3-4 out of 10.  He is currently on prednisone which is planned to be discontinued soon, and he is on bone building therapy.  Past Medical History:  Diagnosis Date  . Acute on chronic respiratory failure with hypoxia ([place])   . COVID-19 virus infection   . DVT of lower extremity, bilateral ([place])   . Epistaxis   . Hypertension   . Incarcerated umbilical hernia 24/4010   Status post repair  . Lupus anticoagulant positive    Per records from [facility]  . 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 Pulmonary artery hypertension ([place]) 04/2011   Mildly elevated pulmonary artery pressure in setting of PE (from [facility])  . Pulmonary embolism associated with COVID-19 ([place])   . Subdural hematoma ([facility])    Remote episode, occurred when taking excedrin and warfarin.     Past Surgical History:  Procedure Laterality Date  . BRAIN SURGERY    . HERNIA REPAIR    . IR KYPHO EA ADDL LEVEL THORACIC OR LUMBAR  05/04/2020  . IR KYPHO EA ADDL LEVEL THORACIC OR LUMBAR  05/04/2020  . IR KYPHO THORACIC WITH BONE BIOPSY  05/04/2020  . IR RADIOLOGIST EVAL & MGMT  04/07/2020  . NASAL ENDOSCOPY WITH EPISTAXIS CONTROL N/A 02/24/2020   Procedure: NASAL ENDOSCOPY WITH EPISTAXIS CONTROL;  Surgeon: [name], MD;  Location: [place];  Service: ENT;  Laterality: N/A;    Allergies: Patient has no known allergies.  Medications: Prior to Admission medications   Medication Sig  Start Date End Date Taking? Authorizing Provider  alendronate (FOSAMAX) 70 MG tablet Take 1 tablet (70 mg total) by mouth every 7 (seven) days. Take with a full glass of water on an empty stomach. 03/31/20 03/31/21  Jeanmarie Hubert, MD  amLODipine (NORVASC) 5 MG tablet Take 1 tablet (5 mg total) by mouth daily. 03/31/20 03/31/21  Jeanmarie Hubert, MD  Ascorbic Acid (VITAMIN C) 500 MG CAPS Take 500 mg by mouth daily.    [provider]  BREO ELLIPTA 200-25 MCG/INH AEPB INHALE 1  PUFF INTO THE LUNGS DAILY Patient taking differently: Inhale 1 puff into the lungs daily.  03/17/20   Velna Ochs, MD  cholecalciferol (VITAMIN D3) 25 MCG (1000 UT) tablet Take 1,000 Units by mouth daily.    [provider]  cyclobenzaprine (FLEXERIL) 5 MG tablet TAKE 1 TABLET(5 MG) BY MOUTH THREE TIMES DAILY FOR UP TO 10 DAYS AS NEEDED FOR MUSCLE SPASMS 06/04/20   Jeanmarie Hubert, MD  diclofenac (CATAFLAM) 50 MG tablet Take 1 tablet (50 mg total) by mouth every 6 (six) hours as needed (max 3 per day). 06/08/20   Arne Cleveland, MD  famotidine (PEPCID) 20 MG tablet Take 1 tablet (20 mg total) by mouth 2 (two) times daily. Patient not taking: Reported on 05/31/2020 04/27/20   Aldine Contes, MD  furosemide (LASIX) 20 MG tablet Take 2 tablets (40 mg total) by mouth daily. TAKE 1 TABLET(20 MG) BY MOUTH DAILY AS NEEDED FOR FLUID RETENTION OR SWELLING 05/25/20   Al Decant, MD  furosemide (LASIX) 40 MG tablet Take 1 tablet (40 mg total) by mouth 2 (two) times daily. 05/26/20   Spero Geralds, MD  ketorolac (TORADOL) 10 MG tablet Take 1 tablet (10 mg total) by mouth every 8 (eight) hours. Patient not taking: Reported on 05/26/2020 05/23/20   Al Decant, MD  lidocaine (LIDODERM) 5 % Place 1 patch onto the skin daily. Remove & Discard patch within 12 hours or as directed by MD 05/24/20   Al Decant, MD  losartan-hydrochlorothiazide (HYZAAR) 50-12.5 MG tablet Take 1 tablet by mouth daily.    [provider]  omega-3 acid ethyl esters (LOVAZA) 1 g capsule Take 1 capsule (1 g total) by mouth 2 (two) times daily. Patient taking differently: Take 1 g by mouth daily.  03/31/20   Jeanmarie Hubert, MD  Potassium Chloride ER 20 MEQ TBCR TAKE 1 TABLET BY MOUTH DAILY Patient taking differently: Take 20 mEq by mouth daily.  04/04/20   Jeanmarie Hubert, MD  predniSONE (DELTASONE) 5 MG tablet Take 3 tablets (15 mg total) by mouth daily with breakfast for 5 days, THEN 2 tablets (10 mg total) daily  with breakfast for 5 days, THEN 1 tablet (5 mg total) daily with breakfast for 5 days. 05/26/20 06/10/20  Spero Geralds, MD  warfarin (COUMADIN) 4 MG tablet Take one-and-one-half (1 & 1/2)  tablets on Mondays and Thursdays. All other day, take one (1) tablet 04/25/20   Jorene Guest B, RPH-CPP  zinc gluconate 50 MG tablet Take 50 mg by mouth daily.    [provider]     Family History  Adopted: Yes    Social History   Socioeconomic History  . Marital status: Divorced    Spouse name: Not on file  . Number of children: Not on file  . Years of education: Not on file  . Highest education level: Not on file  Occupational History  . Occupation: retired Pharmacist, hospital  Tobacco Use  . Smoking  status: Former Smoker    Packs/day: 1.50    Years: 23.00    Pack years: 34.50    Types: Cigarettes    Quit date: 1995    Years since quitting: 26.4  . Smokeless tobacco: Never Used  Vaping Use  . Vaping Use: Never used  Substance and Sexual Activity  . Alcohol use: Never  . Drug use: Not Currently    Types: "Crack" cocaine    Comment: quit 2008  . Sexual activity: Not on file  Other Topics Concern  . Not on file  Social History Narrative  . Not on file   Social Determinants of Health   Financial Resource Strain: Low Risk   . Difficulty of Paying Living Expenses: Not very hard  Food Insecurity:   . Worried About Charity fundraiser in the Last Year:   . Arboriculturist in the Last Year:   Transportation Needs:   . Film/video editor (Medical):   Marland Kitchen Lack of Transportation (Non-Medical):   Physical Activity:   . Days of Exercise per Week:   . Minutes of Exercise per Session:   Stress:   . Feeling of Stress :   Social Connections:   . Frequency of Communication with Friends and Family:   . Frequency of Social Gatherings with Friends and Family:   . Attends Religious Services:   . Active Member of Clubs or Organizations:   . Attends Archivist Meetings:   Marland Kitchen Marital  Status:     ECOG Status: 2 - Symptomatic, <50% confined to bed  Review of Systems  Review of Systems: A 12 point ROS discussed and pertinent positives are indicated in the HPI above.  All other systems are negative.  Physical Exam No direct physical exam was performed (except for noted visual exam findings with Video Visits).     Vital Signs: There were no vitals taken for this visit.  Imaging: CT Head Wo Contrast  Result Date: 05/20/2020 CLINICAL DATA:  Altered mental status EXAM: CT HEAD WITHOUT CONTRAST TECHNIQUE: Contiguous axial images were obtained from the base of the skull through the vertex without intravenous contrast. COMPARISON:  None. FINDINGS: Brain: No acute territorial infarction, hemorrhage or intracranial mass. The ventricles are nonenlarged. Vascular: No hyperdense vessels. Scattered carotid vascular calcifications. Skull: Left parietal craniotomy.  No fracture Sinuses/Orbits: No acute finding. Other: None IMPRESSION: 1. No CT evidence for acute intracranial abnormality. 2. Prior left craniotomy Electronically Signed   By: Donavan Foil M.D.   On: 05/20/2020 17:54   DG Chest Portable 1 View  Result Date: 05/20/2020 CLINICAL DATA:  Dyspnea EXAM: PORTABLE CHEST 1 VIEW COMPARISON:  03/07/2020 FINDINGS: Cardiac shadow is stable. The aortic calcifications are noted. Chronic fibrotic changes are noted in both lungs consistent with the given clinical history of sarcoidosis. No focal infiltrate or sizable effusion is seen. No bony abnormality is noted. IMPRESSION: Chronic scarring bilaterally without acute abnormality. Electronically Signed   By: Inez Catalina M.D.   On: 05/20/2020 15:59   DG Abd Portable 2V  Result Date: 05/20/2020 CLINICAL DATA:  Abdominal distension. EXAM: PORTABLE ABDOMEN - 2 VIEW COMPARISON:  February 27, 2020 FINDINGS: Again noted is a retrievable IVC filter. The bowel gas pattern is nonspecific with some mild gaseous distention of loops of small bowel and  colon scattered throughout the abdomen. There is an above average amount of stool in the colon. There is no evidence for high-grade small bowel obstruction. No evidence for pneumatosis. Evaluation  is limited by patient body habitus. The patient appears to be status post prior multilevel vertebral augmentation of the thoracic spine. IMPRESSION: 1. Nonobstructive bowel gas pattern. 2. Above average amount of stool in the colon. 3. Retrievable IVC filter. Consider IR consultation for potential removal as clinically indicated. 4. Habitus limited study. Electronically Signed   By: Constance Holster M.D.   On: 05/20/2020 20:50   ECHOCARDIOGRAM COMPLETE  Result Date: 05/21/2020    ECHOCARDIOGRAM REPORT   Patient Name:   JERRELL MANGEL Date of Exam: 05/21/2020 Medical Rec #:  [id number]      Height:       69.0 in Accession #:    [id number]     Weight:       218.3 lb Date of Birth:  1951-09-05      BSA:          2.144 m Patient Age:    45 years       BP:           124/87 mmHg Patient Gender: M              HR:           94 bpm. Exam Location:  Inpatient Procedure: 2D Echo, Color Doppler and Cardiac Doppler Indications:    Acute Respiratory Insufficiency R06.89  History:        Patient has prior history of Echocardiogram examinations, most                 recent 02/16/2020. Pulmonary HTN; Risk Factors:Hypertension,                 Sleep Apnea and Lupus. COVID+ 01/06/20.  Sonographer:    Raquel Sarna Senior RDCS Referring Phys: [id number] Raynaldo Opitz RAINES IMPRESSIONS  1. Left ventricular ejection fraction, by estimation, is 55 to 60%. The left ventricle has normal function. The left ventricle has no regional wall motion abnormalities. Left ventricular diastolic parameters are consistent with Grade I diastolic dysfunction (impaired relaxation). There is the interventricular septum is flattened in systole and diastole, consistent with right ventricular pressure and volume overload.  2. Right ventricular systolic function is severely  reduced. The right ventricular size is severely enlarged. Tricuspid regurgitation signal is inadequate for assessing PA pressure.  3. The mitral valve is grossly normal. No evidence of mitral valve regurgitation. No evidence of mitral stenosis.  4. The aortic valve is tricuspid. Aortic valve regurgitation is not visualized. Mild to moderate aortic valve sclerosis/calcification is present, without any evidence of aortic stenosis.  5. The inferior vena cava is dilated in size with <50% respiratory variability, suggesting right atrial pressure of 15 mmHg. Comparison(s): No significant change from prior study. Conclusion(s)/Recommendation(s): Findings consistent with Cor Pulmonale. FINDINGS  Left Ventricle: Left ventricular ejection fraction, by estimation, is 55 to 60%. The left ventricle has normal function. The left ventricle has no regional wall motion abnormalities. The left ventricular internal cavity size was normal in size. There is  no left ventricular hypertrophy. The interventricular septum is flattened in systole and diastole, consistent with right ventricular pressure and volume overload. Left ventricular diastolic parameters are consistent with Grade I diastolic dysfunction (impaired relaxation). Normal left ventricular filling pressure. Right Ventricle: The right ventricular size is severely enlarged. No increase in right ventricular wall thickness. Right ventricular systolic function is severely reduced. Tricuspid regurgitation signal is inadequate for assessing PA pressure. Left Atrium: Left atrial size was normal in size. Right Atrium: Right atrial size was normal in size.  Pericardium: Trivial pericardial effusion is present. Presence of pericardial fat pad. Mitral Valve: The mitral valve is grossly normal. No evidence of mitral valve regurgitation. No evidence of mitral valve stenosis. Tricuspid Valve: The tricuspid valve is grossly normal. Tricuspid valve regurgitation is trivial. No evidence of  tricuspid stenosis. Aortic Valve: The aortic valve is tricuspid. Aortic valve regurgitation is not visualized. Mild to moderate aortic valve sclerosis/calcification is present, without any evidence of aortic stenosis. Pulmonic Valve: The pulmonic valve was grossly normal. Pulmonic valve regurgitation is not visualized. No evidence of pulmonic stenosis. Aorta: The aortic root and ascending aorta are structurally normal, with no evidence of dilitation. Venous: The inferior vena cava is dilated in size with less than 50% respiratory variability, suggesting right atrial pressure of 15 mmHg. IAS/Shunts: The atrial septum is grossly normal.  LEFT VENTRICLE PLAX 2D LVIDd:         3.30 cm  Diastology LVIDs:         2.20 cm  LV e' lateral:   9.79 cm/s LV PW:         1.00 cm  LV E/e' lateral: 5.3 LV IVS:        1.10 cm  LV e' medial:    4.03 cm/s LVOT diam:     2.40 cm  LV E/e' medial:  12.9 LV SV:         74 LV SV Index:   34 LVOT Area:     4.52 cm  RIGHT VENTRICLE RV S prime:     9.46 cm/s TAPSE (M-mode): 1.3 cm LEFT ATRIUM             Index       RIGHT ATRIUM           Index LA diam:        2.20 cm 1.03 cm/m  RA Area:     18.60 cm LA Vol (A2C):   47.1 ml 21.97 ml/m RA Volume:   57.20 ml  26.68 ml/m LA Vol (A4C):   46.5 ml 21.69 ml/m LA Biplane Vol: 50.8 ml 23.69 ml/m  AORTIC VALVE LVOT Vmax:   90.75 cm/s LVOT Vmean:  65.050 cm/s LVOT VTI:    0.163 m  AORTA Ao Root diam: 3.50 cm Ao Asc diam:  3.60 cm MITRAL VALVE MV Area (PHT): 3.77 cm    SHUNTS MV Decel Time: 201 msec    Systemic VTI:  0.16 m MV E velocity: 51.80 cm/s  Systemic Diam: 2.40 cm MV A velocity: 93.00 cm/s MV E/A ratio:  0.56 Eleonore Chiquito MD Electronically signed by Eleonore Chiquito MD Signature Date/Time: 05/21/2020/4:20:35 PM    Final     Labs:  CBC: Recent Labs    05/04/20 9678 05/20/20 1211 05/20/20 1229 05/20/20 1229 05/20/20 2109 05/21/20 0030 05/21/20 0325 05/22/20 0803  WBC 13.4*  --  18.4*  --   --   --  14.2* 12.8*  HGB 11.1*   <  > 11.0*   < > 12.2* 11.9* 10.7* 10.3*  HCT 37.0*   < > 37.0*   < > 36.0* 35.0* 35.9* 35.7*  PLT 406*  --  459*  --   --   --  394 356   < > = values in this interval not displayed.    COAGS: Recent Labs    02/14/20 1900 02/16/20 0240 05/23/20 0235 05/24/20 0250 05/25/20 0148 05/31/20 1452  INR  --    < > 2.5* 2.6* 2.9* 3.4*  APTT 32  --   --   --   --   --    < > =  values in this interval not displayed.    BMP: Recent Labs    05/20/20 1338 05/20/20 2109 05/21/20 0030 05/21/20 0325 05/22/20 0803 05/25/20 0750  NA 138   < > 138 139 141 141  K 3.7   < > 3.8 3.8 3.7 3.3*  CL 100  --   --  99 102 96*  CO2 27  --   --  29 31 34*  GLUCOSE 114*  --   --  115* 106* 101*  BUN 25*  --   --  _0 CALCIUM 8.7*  --   --  8.5* 8.7* 8.9  CREATININE 0.82  --   --  0.72 0.80 0.70  GFRNONAA >60  --   --  >60 >60 >60  GFRAA >60  --   --  >60 >60 >60   < > = values in this interval not displayed.    LIVER FUNCTION TESTS: Recent Labs    01/24/20 1615 02/14/20 1412 03/02/20 0539 05/20/20 1338  BILITOT 1.0 1.4* 0.4 0.8  AST _1 ALT _2 ALKPHOS 64 89 60 63  PROT 6.9 6.6 5.6* 7.0  ALBUMIN 3.8 3.6 2.7* 3.6    TUMOR MARKERS: No results for input(s): AFPTM, CEA, CA199, CHROMGRNA in the last 8760 hours.  Assessment :  My impression is that this patient has had incomplete pain relief after thoracic kyphoplasty T8-T10.  It is unclear whether the symptoms are from the previously affected levels, secondary to new thoracic compression deformities of both are below the treatment regions, or related to other musculoskeletal issues.  It is well-documented that biomechanical stresses change in the setting of multiple compression deformities which can cause flare of spondylitic or other musculoskeletal issues.  I reviewed my concerns with patient.  He thinks that Toradol might be more useful than the Tylenol, and I am happy to give him a trial of that.  I also suggested  that if his pain persists at a level that is intolerable we should get an MR thoracic spine to assess the treatment levels and exclude any new pathology.  He is motivated to go ahead and get that done right now, which I think is quite reasonable given his persistent limitations.  Plan: 1.  MR thoracic spine with contrast, rule out new compression fracture 2.  Toradol p.o. x3 days as needed for pain control. Note: After our discussion, came to my attention that his pharmacy would not cover this medication, so I changed his prescription to Voltaren. 3.  I will follow up with him after his MR thoracic spine to see how he is doing and review the findings.  Thank you for this interesting consult.  I greatly enjoyed meeting JAKEN FREGIA and look forward to participating in their care.  A copy of this report was sent to the requesting provider on this date.  Electronically Signed: Rickard Rhymes 06/08/2020, 1:52 PM   I spent a total of    25 Minutes in remote  clinical consultation, greater than 50% of which was counseling/coordinating care for thoracic spine compression fractures, post kyphoplasty.    Visit type: Audio only (telephone). Audio (no video) only due to patient's lack of internet/smartphone capability. Alternative for in-person consultation at [facility], [address], [place], [place]. This visit type was conducted due to national recommendations for restrictions regarding the COVID-19 Pandemic (e.g. social distancing).  This format is felt to be most appropriate  for this patient at this time.  All issues noted in this document were discussed and addressed.

## 2020-06-13 ENCOUNTER — Other Ambulatory Visit: Payer: Self-pay

## 2020-06-13 ENCOUNTER — Telehealth: Payer: Self-pay | Admitting: Primary Care

## 2020-06-13 ENCOUNTER — Telehealth (HOSPITAL_COMMUNITY): Payer: Self-pay | Admitting: Vascular Surgery

## 2020-06-13 ENCOUNTER — Other Ambulatory Visit (HOSPITAL_COMMUNITY): Payer: Medicare Other

## 2020-06-13 ENCOUNTER — Encounter: Payer: Self-pay | Admitting: Primary Care

## 2020-06-13 ENCOUNTER — Ambulatory Visit (INDEPENDENT_AMBULATORY_CARE_PROVIDER_SITE_OTHER): Payer: Medicare Other | Admitting: Primary Care

## 2020-06-13 ENCOUNTER — Ambulatory Visit (INDEPENDENT_AMBULATORY_CARE_PROVIDER_SITE_OTHER): Payer: Medicare Other

## 2020-06-13 VITALS — BP 130/80 | HR 91 | Temp 98.0°F | Ht 69.0 in | Wt 225.4 lb

## 2020-06-13 DIAGNOSIS — I50813 Acute on chronic right heart failure: Secondary | ICD-10-CM | POA: Diagnosis not present

## 2020-06-13 DIAGNOSIS — R0602 Shortness of breath: Secondary | ICD-10-CM | POA: Diagnosis not present

## 2020-06-13 LAB — BASIC METABOLIC PANEL
BUN: 21 mg/dL (ref 6–23)
CO2: 36 mEq/L — ABNORMAL HIGH (ref 19–32)
Calcium: 9.2 mg/dL (ref 8.4–10.5)
Chloride: 100 mEq/L (ref 96–112)
Creatinine, Ser: 0.89 mg/dL (ref 0.40–1.50)
GFR: 103.19 mL/min (ref >60.00)
Glucose, Bld: 109 mg/dL — ABNORMAL HIGH (ref 70–99)
Potassium: 4.6 mEq/L (ref 3.5–5.1)
Sodium: 140 mEq/L (ref 135–145)

## 2020-06-13 LAB — BRAIN NATRIURETIC PEPTIDE: Pro B Natriuretic peptide (BNP): 43 pg/mL (ref 0.0–100.0)

## 2020-06-13 MED ORDER — METOLAZONE 2.5 MG PO TABS: ORAL_TABLET | ORAL | 0 refills | Status: DC

## 2020-06-13 NOTE — Patient Instructions (Addendum)
Plan: Continue to taper off prednisone. Stop prednisone after tomorrows dose Continue lasix 57m twice daily (we will likely change this to torsemide or add something called zaroxolyn) Please follow 1.5L fluid restriction  Wear 3L oxygen during the day and at night  Orders: BNP/BMET today CXR  Follow-up: 1 week with Beth NP Please see if cardiology can move up his apt, urgent re: acute on chronic heart failure     Fluid Restriction With some health conditions, you must restrict your fluid intake. This means that you need to limit the amount of fluid that you drink each day (fluid restriction). When you have a fluid restriction, you must carefully measure and keep track of the amount of fluid that you drink. Your health care provider will identify the specific amount of fluid you are allowed each day (fluid allowance). This amount may depend on several things, such as:  How well your kidneys function.  How much fluid you are keeping (retaining) in your body tissues.  Your blood pressure.  Your heart function.  Your blood sodium level. What is my plan? Your health care provider recommends that you limit your fluid intake to __1.5L__ per day. What counts toward my fluid intake? Your fluid intake includes all liquids that you drink, as well as any foods that become liquid at room temperature. The following are examples of some fluids that you will have to restrict:  Tea, coffee, soda, lemonade, milk, water, juice, sports drinks, and nutritional supplement beverages.  Alcoholic beverages.  Cream.  Gravy.  Ice cubes.  Soup and broth. The following are examples of foods that become liquid at room temperature. These foods will also count toward your fluid intake.  Ice cream and ice milk.  Frozen yogurt and sherbet.  Frozen ice pops.  Flavored gelatin. How do I keep track of my fluid intake? Each morning, fill a jug with the amount of water that is equal to your daily  fluid allowance. You can use this water as a guideline for fluid allowance. Each time you take in any form of fluid (including ice cubes and foods that become liquid at room temperature), pour an equal amount of water out of the container. This helps you to see how much fluid you are taking in. It also helps you to see how much more fluid you can take in during the rest of the day. The following conversions may also be helpful in measuring your fluid intake:  1 cup equals 8 oz (240 mL).   cup equals 6 oz (180 mL).  ? cup equals 5? oz (160 mL).   cup equals 4 oz (120 mL).  ? cup equals 2? oz (80 mL).   cup equals 2 oz (60 mL).  2 Tbsp equals 1 oz (30 mL). What are tips for following this plan? General instructions  Make sure that you stay within your recommended fluid allowance each day. Always measure and keep track of your fluids (including ice cubes and foods that become liquid at room temperature).  Use small cups and glasses and learn to sip fluids slowly.  Try frozen fruits between meals, such as grapes or strawberries. These can satisfy thirst without adding to your fluid intake.  Swallow your pills along with meals or soft foods such as applesauce or mashed potatoes, instead of with liquids. Doing this helps you to save your fluid allowance for something that you enjoy. Weigh yourself each day     Weigh yourself every day. Keeping track  of your daily weight can help you and your health care provider to notice as soon as possible if you are retaining too much fluid in your body.  Follow this sequence every morning: 1. Urinate. 2. Weigh yourself. 3. Eat breakfast.  Wear the same amount of clothing each time you weigh yourself.  Write down your daily weight. Give this weight record to your health care provider. If your weight is going up, you may be retaining too much fluid. Every 1 lb (0.45 kg) of body weight that you gain is a sign that your body is retaining 2 cups  (480 mL) of fluid.  Manage your thirst  Add lemon juice or a slice of fresh lemon to water or ice. Doing this helps to satisfy your thirst.  Freeze fruit juice or water in an ice cube tray. Use this as part of your fluid allowance. These cubes are useful for quenching your thirst. Before you freeze the juice or water, measure how much liquid you use to fill a cube section of the ice tray. Subtract this amount from your day's allowance each time you consume a frozen cube.  Avoid salty (high-sodium) foods. These foods make you thirsty and make it more difficult to stay within your daily fluid allowance.  Keep the temperature in your home at a cooler level.  Keep the air in your home as humid as possible. Dry air increases thirst.  Avoid being out in the hot sun, which can cause you to sweat and become thirsty.  To help avoid dry mouth, brush your teeth often or rinse out your mouth with mouthwash. Lemon wedges, hard sour candies, chewing gum, or breath spray may also help to moisten your mouth. What are some signs that I may be taking in too much fluid? You may be taking in too much fluid if:  Your weight increases. Contact your health care provider if you gain weight rapidly.  Your face, hands, legs, feet, and abdomen start to swell.  You have trouble breathing. Summary  With some health conditions, you must limit (restrict) your fluid intake. This means that you need to limit the amount of fluid you drink each day (fluid restriction). Your health care provider will identify the specific amount of fluid that you are allowed each day.  When you have a fluid restriction, you must carefully measure and keep track of the amount of fluid that you drink.  Your fluid intake includes all liquids that you drink, as well as any foods that become liquid at room temperature (such as ice cream and gelatin).  You may be taking in too much fluid if your weight increases, your body starts to swell,  or you have trouble breathing. This information is not intended to replace advice given to you by your health care provider. Make sure you discuss any questions you have with your health care provider. Document Revised: 04/02/2019 Document Reviewed: 08/14/2017 [employer]vier Patient Education  2020 [employer].

## 2020-06-13 NOTE — Telephone Encounter (Signed)
Derl Barrow from [facility] will like pt to get a soon NEW PT APPT w/ [facility] .Marland Kitchen Pt is schedule new pt appt 7/26.. please advise

## 2020-06-13 NOTE — Assessment & Plan Note (Addendum)
-  His weight is up 10 lbs with significant abdominal bloating and LE edema  - We were able to move new patient apt with cardiology to July 7th  - He will stop prednisone after tomorrow on 06/15/20 - BNP, potassium and kidney function were normal. He is still retaining fluid. I have sent in an additional diuretic called Zaroxolyn. He is to take 2.14m (1 tablet) this every other day x1 week until his follow up with [facility]. Needs labs drawn prior to visit that same day  - FU in 1 week with Pulmonary

## 2020-06-13 NOTE — Telephone Encounter (Signed)
Patient called and medication administration directions reviewed for Zaroxolyn. Patient verbalized understanding of recommendations.

## 2020-06-13 NOTE — Progress Notes (Signed)
Please let patient know his BNP, potassium and kidney function were normal. He is still retaining fluid as his weight was up 10 lbs and he had abdominal/lower extremity edema. I have sent in an additional diuretic called Zaroxolyn. He is to take 2.2m (1 tablet) this every other day x1 week until his follow up with [facility]. Needs labs drawn prior to visit that same day (BNP/bmet)

## 2020-06-13 NOTE — Progress Notes (Signed)
_0  ID: Jesus Russell, male    DOB: 12/22/1951, 69 y.o.   MRN: [id number]  Chief Complaint  Patient presents with  . Follow-up    pt states was in hospital for back pain and sob has worse and he has edema in both feet.pt is no longer taking predinose    Referring provider: Jeanmarie Hubert, MD  HPI: 69 year old male, former smoker quit 1995 (84-pack-year history).  Past medical history significant for sarcoidosis, obstructive sleep apnea, pulmonary embolism (on long-term anticoagulation), pretension, right heart failure, hepatic steatosis.  Patient of Dr. Shearon Stalls, last seen on 05/26/20.  Patient recently hospitalized from 5/28-6/2 due to respiratory failure.   Previous [facility] pulmonary encounters: Jesus Russell is a 69 y.o. gentleman. Diagnosed with Sarcoidosis in the 1990 range when he was going for a hernia surgery. Was seen by a pulmonologist and had bronchoscopy with biopsies. He was initially asymptomatic.  Also has 35 pack year smoking history (quit in 1990s,) and significant drug use in the past. Additional history of Lupus Anticoagulant positive on long term anticoagulation with warfarin.   First saw me in Nov 2020. Had [facility] in Jan 2021. Since then interval hospitalization in Feb 2021 for compression fractures of his spine. Discontinued warfarin later in Feb for worsening pain and taking naproxen. He had recurrent Acute PE at that time. After starting heparin gtt had massive epistaxis and required emergent intubation. Developed obstructive shock and required ENT surgical cautery for his epistaxis. Was eventually discharged on lovenox for his acute PE. Was resumed on warfarin.   Interval Updates: Hospitalized yet again in end of May 2021 (discharged yesterday) for hypoxemic and hypercapnic respiratory failure.) Felt to be secondary to underlying sleep disorder and heart failure. Improved with diuresis. Currently on 20 mg prednisone daily for his sarcoidosis. Taking lasix 40 mg po  daily. Not sure it's helping.   06/13/2020 Patient presents today for 2-3-week follow-up.  He is maintained on prednisone 20 mg daily for burned-out sarcoidosis.  Plan has been to taper this due to worsening fluid retention.  Lasix increased during last visit to 40 mg twice daily.  Referred to cardiology for acute on chronic right ventricular heart failure.  He was ordered for split-night sleep study for suspected underlying OSA, this has not been completed yet. He follows with Coumadin clinic for anticoagulation. Patient has appointment with cardiology July 26.  Shortness of breath is a little worse. He has a rare cough. Feels it is mostly related to abdominal bloating. He continues Breo 200 one puff daily. He normally wears 2L pulse oxygen as needed and wears 2L oxygen at night. He increased his oxygen to 3L pulsed this morning and his O2 was 91-92%. He is compliant with lasix 52m twice daily. He puts out two urinals full a day. He is tapering off prednisone and is current on 5760mdaily, he has 2-3 days left and will then stop. His weight is up 10 lbs since last visit. He will see coumadin clinic on 6/28. He is taking 60m58maily and once a week on Mondays he takes 6mg59menies chest pain.    No Known Allergies  Immunization History  Administered Date(s) Administered  . Influenza,inj,Quad PF,6+ Mos 09/09/2019  . PFIZER SARS-COV-2 Vaccination 04/09/2020, 05/02/2020    Past Medical History:  Diagnosis Date  . Acute on chronic respiratory failure with hypoxia (HCC)[place]. COVID-19 virus infection   . DVT of lower extremity, bilateral (HCC)[place]. Epistaxis   .  Hypertension   . Incarcerated umbilical hernia [id number]/3419   Status post repair  . Lupus anticoagulant positive    Per records from [facility]  . 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 Pulmonary artery hypertension ([place]) 04/2011   Mildly elevated pulmonary artery pressure in  setting of PE (from [facility])  . Pulmonary embolism associated with COVID-19 ([place])   . Subdural hematoma (HCC)    Remote episode, occurred when taking excedrin and warfarin.     Tobacco History: Social History   Tobacco Use  Smoking Status Former Smoker  . Packs/day: 1.50  . Years: 23.00  . Pack years: 34.50  . Types: Cigarettes  . Quit date: 3  . Years since quitting: 26.4  Smokeless Tobacco Never Used   Counseling given: Not Answered   Outpatient Medications Prior to Visit  Medication Sig Dispense Refill  . alendronate (FOSAMAX) 70 MG tablet Take 1 tablet (70 mg total) by mouth every 7 (seven) days. Take with a full glass of water on an empty stomach. 12 tablet 3  . amLODipine (NORVASC) 5 MG tablet Take 1 tablet (5 mg total) by mouth daily. 30 tablet 11  . Ascorbic Acid (VITAMIN C) 500 MG CAPS Take 500 mg by mouth daily.    Marland Kitchen BREO ELLIPTA 200-25 MCG/INH AEPB INHALE 1 PUFF INTO THE LUNGS DAILY (Patient taking differently: Inhale 1 puff into the lungs daily. ) 60 each 2  . cholecalciferol (VITAMIN D3) 25 MCG (1000 UT) tablet Take 1,000 Units by mouth daily.    . famotidine (PEPCID) 20 MG tablet Take 1 tablet (20 mg total) by mouth 2 (two) times daily. 180 tablet 1  . furosemide (LASIX) 20 MG tablet Take 2 tablets (40 mg total) by mouth daily. TAKE 1 TABLET(20 MG) BY MOUTH DAILY AS NEEDED FOR FLUID RETENTION OR SWELLING 90 tablet 0  . furosemide (LASIX) 40 MG tablet Take 1 tablet (40 mg total) by mouth 2 (two) times daily. 60 tablet 5  . losartan-hydrochlorothiazide (HYZAAR) 50-12.5 MG tablet Take 1 tablet by mouth daily.    Marland Kitchen omega-3 acid ethyl esters (LOVAZA) 1 g capsule Take 1 capsule (1 g total) by mouth 2 (two) times daily. (Patient taking differently: Take 1 g by mouth daily. ) 30 capsule 3  . Potassium Chloride ER 20 MEQ TBCR TAKE 1 TABLET BY MOUTH DAILY (Patient taking differently: Take 20 mEq by mouth daily. ) 90 tablet 0  . warfarin (COUMADIN) 4 MG tablet Take  one-and-one-half (1 & 1/2)  tablets on Mondays and Thursdays. All other day, take one (1) tablet 96 tablet 1  . zinc gluconate 50 MG tablet Take 50 mg by mouth daily.    . cyclobenzaprine (FLEXERIL) 5 MG tablet TAKE 1 TABLET(5 MG) BY MOUTH THREE TIMES DAILY FOR UP TO 10 DAYS AS NEEDED FOR MUSCLE SPASMS 15 tablet 0  . diclofenac (CATAFLAM) 50 MG tablet Take 1 tablet (50 mg total) by mouth every 6 (six) hours as needed (max 3 per day). 30 tablet 2  . ketorolac (TORADOL) 10 MG tablet Take 1 tablet (10 mg total) by mouth every 8 (eight) hours. (Patient not taking: Reported on 05/26/2020) 20 tablet 0  . lidocaine (LIDODERM) 5 % Place 1 patch onto the skin daily. Remove & Discard patch within 12 hours or as directed by MD 30 patch 0   No facility-administered medications prior to visit.   Review of  Systems  Review of Systems  Respiratory: Positive for shortness of breath. Negative for cough.   Cardiovascular: Positive for leg swelling.  Gastrointestinal: Positive for abdominal distention.    Physical Exam  BP 130/80   Pulse 91   Temp 98 F (36.7 C) (Oral)   Ht _0  (1.753 m)   Wt 225 lb 6.4 oz (102.2 kg)   SpO2 91%   BMI 33.29 kg/m  Physical Exam Constitutional:      Appearance: Normal appearance.  HENT:     Head: Normocephalic and atraumatic.     Mouth/Throat:     Mouth: Mucous membranes are moist.     Pharynx: Oropharynx is clear.  Cardiovascular:     Rate and Rhythm: Normal rate and regular rhythm.  Pulmonary:     Effort: Pulmonary effort is normal. No respiratory distress.     Breath sounds: Wheezing present. No rhonchi or rales.  Abdominal:     General: There is distension.  Musculoskeletal:     Right lower leg: Edema present.     Left lower leg: Edema present.  Neurological:     General: No focal deficit present.     Mental Status: He is alert and oriented to person, place, and time. Mental status is at baseline.  Psychiatric:        Mood and Affect: Mood normal.          Behavior: Behavior normal.        Thought Content: Thought content normal.      Lab Results:  CBC    Component Value Date/Time   WBC 12.8 (H) 05/22/2020 0803   RBC 3.64 (L) 05/22/2020 0803   HGB 10.3 (L) 05/22/2020 0803   HGB 11.7 (L) 03/31/2020 1412   HCT 35.7 (L) 05/22/2020 0803   HCT 36.3 (L) 03/31/2020 1412   PLT 356 05/22/2020 0803   PLT 331 03/31/2020 1412   MCV 98.1 05/22/2020 0803   MCV 100 (H) 03/31/2020 1412   MCH 28.3 05/22/2020 0803   MCHC 28.9 (L) 05/22/2020 0803   RDW 17.1 (H) 05/22/2020 0803   RDW 14.3 03/31/2020 1412   LYMPHSABS 1.2 05/20/2020 1229   LYMPHSABS 2.3 03/31/2020 1412   MONOABS 1.2 (H) 05/20/2020 1229   EOSABS 0.0 05/20/2020 1229   EOSABS 0.2 03/31/2020 1412   BASOSABS 0.0 05/20/2020 1229   BASOSABS 0.1 03/31/2020 1412    BMET    Component Value Date/Time   NA 140 06/13/2020 0944   NA 141 03/31/2020 1412   K 4.6 06/13/2020 0944   CL 100 06/13/2020 0944   CO2 36 (H) 06/13/2020 0944   GLUCOSE 109 (H) 06/13/2020 0944   BUN 21 06/13/2020 0944   BUN 17 03/31/2020 1412   CREATININE 0.89 06/13/2020 0944   CALCIUM 9.2 06/13/2020 0944   GFRNONAA >60 05/25/2020 0750   GFRAA >60 05/25/2020 0750    BNP    Component Value Date/Time   BNP 82.9 05/20/2020 1228    ProBNP    Component Value Date/Time   PROBNP 43.0 06/13/2020 0944    Imaging: CT Head Wo Contrast  Result Date: 05/20/2020 CLINICAL DATA:  Altered mental status EXAM: CT HEAD WITHOUT CONTRAST TECHNIQUE: Contiguous axial images were obtained from the base of the skull through the vertex without intravenous contrast. COMPARISON:  None. FINDINGS: Brain: No acute territorial infarction, hemorrhage or intracranial mass. The ventricles are nonenlarged. Vascular: No hyperdense vessels. Scattered carotid vascular calcifications. Skull: Left parietal craniotomy.  No fracture Sinuses/Orbits: No acute  finding. Other: None IMPRESSION: 1. No CT evidence for acute intracranial abnormality.  2. Prior left craniotomy Electronically Signed   By: Donavan Foil M.D.   On: 05/20/2020 17:54   DG Chest Portable 1 View  Result Date: 05/20/2020 CLINICAL DATA:  Dyspnea EXAM: PORTABLE CHEST 1 VIEW COMPARISON:  03/07/2020 FINDINGS: Cardiac shadow is stable. The aortic calcifications are noted. Chronic fibrotic changes are noted in both lungs consistent with the given clinical history of sarcoidosis. No focal infiltrate or sizable effusion is seen. No bony abnormality is noted. IMPRESSION: Chronic scarring bilaterally without acute abnormality. Electronically Signed   By: Inez Catalina M.D.   On: 05/20/2020 15:59   DG Abd Portable 2V  Result Date: 05/20/2020 CLINICAL DATA:  Abdominal distension. EXAM: PORTABLE ABDOMEN - 2 VIEW COMPARISON:  February 27, 2020 FINDINGS: Again noted is a retrievable IVC filter. The bowel gas pattern is nonspecific with some mild gaseous distention of loops of small bowel and colon scattered throughout the abdomen. There is an above average amount of stool in the colon. There is no evidence for high-grade small bowel obstruction. No evidence for pneumatosis. Evaluation is limited by patient body habitus. The patient appears to be status post prior multilevel vertebral augmentation of the thoracic spine. IMPRESSION: 1. Nonobstructive bowel gas pattern. 2. Above average amount of stool in the colon. 3. Retrievable IVC filter. Consider IR consultation for potential removal as clinically indicated. 4. Habitus limited study. Electronically Signed   By: Constance Holster M.D.   On: 05/20/2020 20:50   ECHOCARDIOGRAM COMPLETE  Result Date: 05/21/2020    ECHOCARDIOGRAM REPORT   Patient Name:   Jesus Russell Date of Exam: 05/21/2020 Medical Rec #:  [id number]      Height:       69.0 in Accession #:    [id number]     Weight:       218.3 lb Date of Birth:  01-17-1951      BSA:          2.144 m Patient Age:    64 years       BP:           124/87 mmHg Patient Gender: M              HR:            94 bpm. Exam Location:  Inpatient Procedure: 2D Echo, Color Doppler and Cardiac Doppler Indications:    Acute Respiratory Insufficiency R06.89  History:        Patient has prior history of Echocardiogram examinations, most                 recent 02/16/2020. Pulmonary HTN; Risk Factors:Hypertension,                 Sleep Apnea and Lupus. COVID+ 01/06/20.  Sonographer:    Raquel Sarna Senior RDCS Referring Phys: [id number] Raynaldo Opitz RAINES IMPRESSIONS  1. Left ventricular ejection fraction, by estimation, is 55 to 60%. The left ventricle has normal function. The left ventricle has no regional wall motion abnormalities. Left ventricular diastolic parameters are consistent with Grade I diastolic dysfunction (impaired relaxation). There is the interventricular septum is flattened in systole and diastole, consistent with right ventricular pressure and volume overload.  2. Right ventricular systolic function is severely reduced. The right ventricular size is severely enlarged. Tricuspid regurgitation signal is inadequate for assessing PA pressure.  3. The mitral valve is grossly normal. No evidence of mitral valve regurgitation. No evidence  of mitral stenosis.  4. The aortic valve is tricuspid. Aortic valve regurgitation is not visualized. Mild to moderate aortic valve sclerosis/calcification is present, without any evidence of aortic stenosis.  5. The inferior vena cava is dilated in size with <50% respiratory variability, suggesting right atrial pressure of 15 mmHg. Comparison(s): No significant change from prior study. Conclusion(s)/Recommendation(s): Findings consistent with Cor Pulmonale. FINDINGS  Left Ventricle: Left ventricular ejection fraction, by estimation, is 55 to 60%. The left ventricle has normal function. The left ventricle has no regional wall motion abnormalities. The left ventricular internal cavity size was normal in size. There is  no left ventricular hypertrophy. The interventricular septum is flattened in  systole and diastole, consistent with right ventricular pressure and volume overload. Left ventricular diastolic parameters are consistent with Grade I diastolic dysfunction (impaired relaxation). Normal left ventricular filling pressure. Right Ventricle: The right ventricular size is severely enlarged. No increase in right ventricular wall thickness. Right ventricular systolic function is severely reduced. Tricuspid regurgitation signal is inadequate for assessing PA pressure. Left Atrium: Left atrial size was normal in size. Right Atrium: Right atrial size was normal in size. Pericardium: Trivial pericardial effusion is present. Presence of pericardial fat pad. Mitral Valve: The mitral valve is grossly normal. No evidence of mitral valve regurgitation. No evidence of mitral valve stenosis. Tricuspid Valve: The tricuspid valve is grossly normal. Tricuspid valve regurgitation is trivial. No evidence of tricuspid stenosis. Aortic Valve: The aortic valve is tricuspid. Aortic valve regurgitation is not visualized. Mild to moderate aortic valve sclerosis/calcification is present, without any evidence of aortic stenosis. Pulmonic Valve: The pulmonic valve was grossly normal. Pulmonic valve regurgitation is not visualized. No evidence of pulmonic stenosis. Aorta: The aortic root and ascending aorta are structurally normal, with no evidence of dilitation. Venous: The inferior vena cava is dilated in size with less than 50% respiratory variability, suggesting right atrial pressure of 15 mmHg. IAS/Shunts: The atrial septum is grossly normal.  LEFT VENTRICLE PLAX 2D LVIDd:         3.30 cm  Diastology LVIDs:         2.20 cm  LV e' lateral:   9.79 cm/s LV PW:         1.00 cm  LV E/e' lateral: 5.3 LV IVS:        1.10 cm  LV e' medial:    4.03 cm/s LVOT diam:     2.40 cm  LV E/e' medial:  12.9 LV SV:         74 LV SV Index:   34 LVOT Area:     4.52 cm  RIGHT VENTRICLE RV S prime:     9.46 cm/s TAPSE (M-mode): 1.3 cm LEFT ATRIUM              Index       RIGHT ATRIUM           Index LA diam:        2.20 cm 1.03 cm/m  RA Area:     18.60 cm LA Vol (A2C):   47.1 ml 21.97 ml/m RA Volume:   57.20 ml  26.68 ml/m LA Vol (A4C):   46.5 ml 21.69 ml/m LA Biplane Vol: 50.8 ml 23.69 ml/m  AORTIC VALVE LVOT Vmax:   90.75 cm/s LVOT Vmean:  65.050 cm/s LVOT VTI:    0.163 m  AORTA Ao Root diam: 3.50 cm Ao Asc diam:  3.60 cm MITRAL VALVE MV Area (PHT): 3.77 cm    SHUNTS  MV Decel Time: 201 msec    Systemic VTI:  0.16 m MV E velocity: 51.80 cm/s  Systemic Diam: 2.40 cm MV A velocity: 93.00 cm/s MV E/A ratio:  0.56 Eleonore Chiquito MD Electronically signed by Eleonore Chiquito MD Signature Date/Time: 05/21/2020/4:20:35 PM    Final    IR Radiologist Eval & Mgmt  Result Date: 06/08/2020 Please refer to notes tab for details about interventional procedure. (Op Note)    Assessment & Plan:   Right heart failure ([place]) - His weight is up 10 lbs with significant abdominal bloating and LE edema  - We were able to move new patient apt with cardiology to July 7th  - He will stop prednisone after tomorrow on 06/15/20 - BNP, potassium and kidney function were normal. He is still retaining fluid. I have sent in an additional diuretic called Zaroxolyn. He is to take 2.13m (1 tablet) this every other day x1 week until his follow up with [facility] [place]. Needs labs drawn prior to visit that same day  - FU in 1 week with Pulmonary      EMartyn Ehrich NP 06/13/2020

## 2020-06-13 NOTE — Telephone Encounter (Signed)
appt moved up to 7/7

## 2020-06-14 ENCOUNTER — Telehealth: Payer: Self-pay | Admitting: Primary Care

## 2020-06-14 NOTE — Telephone Encounter (Signed)
Patient wanted to review his two fluid pill directions. Patient instructed to call our office if he does not get some relief of his edema in the next 24 hours. He was also advised to seek urgent care should his fluid/edema worsen.

## 2020-06-15 ENCOUNTER — Encounter (HOSPITAL_BASED_OUTPATIENT_CLINIC_OR_DEPARTMENT_OTHER): Payer: Medicare Other | Admitting: Pulmonary Disease

## 2020-06-20 ENCOUNTER — Other Ambulatory Visit: Payer: Self-pay

## 2020-06-20 ENCOUNTER — Ambulatory Visit (INDEPENDENT_AMBULATORY_CARE_PROVIDER_SITE_OTHER): Payer: Medicare Other | Admitting: Pharmacist

## 2020-06-20 ENCOUNTER — Ambulatory Visit (INDEPENDENT_AMBULATORY_CARE_PROVIDER_SITE_OTHER): Payer: Medicare Other | Admitting: Acute Care

## 2020-06-20 ENCOUNTER — Encounter: Payer: Self-pay | Admitting: Acute Care

## 2020-06-20 DIAGNOSIS — J9611 Chronic respiratory failure with hypoxia: Secondary | ICD-10-CM | POA: Diagnosis not present

## 2020-06-20 DIAGNOSIS — I2699 Other pulmonary embolism without acute cor pulmonale: Secondary | ICD-10-CM

## 2020-06-20 DIAGNOSIS — D869 Sarcoidosis, unspecified: Secondary | ICD-10-CM | POA: Diagnosis not present

## 2020-06-20 DIAGNOSIS — Z86718 Personal history of other venous thrombosis and embolism: Secondary | ICD-10-CM | POA: Diagnosis not present

## 2020-06-20 DIAGNOSIS — I2781 Cor pulmonale (chronic): Secondary | ICD-10-CM

## 2020-06-20 DIAGNOSIS — Z86711 Personal history of pulmonary embolism: Secondary | ICD-10-CM

## 2020-06-20 DIAGNOSIS — Z7901 Long term (current) use of anticoagulants: Secondary | ICD-10-CM

## 2020-06-20 LAB — POCT INR: INR: 3.5 — AB (ref 2.0–3.0)

## 2020-06-20 NOTE — Patient Instructions (Signed)
Patient instructed to take medications as defined in the Anti-coagulation Track section of this encounter.  Patient instructed to take today's dose.  Patient instructed to take one of your 27m strength blue warfarin tablets, by mouth, once-daily at 6PM--EXCEPT on MONDAYS--take ONLY 1/2 tablet on MONDAYS.  Patient verbalized understanding of these instructions.

## 2020-06-20 NOTE — Progress Notes (Signed)
Anticoagulation Management Jesus Russell is a 69 y.o. male who reports to the clinic for monitoring of warfarin treatment.    Indication: DVT, History of; Long term current use of anticoagulant.   Duration: indefinite Supervising physician: Lenice Pressman, MD, PhD  Anticoagulation Clinic Visit History: Patient does not report signs/symptoms of bleeding or thromboembolism  Other recent changes: No diet, medications, lifestyle changes except as noted.  Anticoagulation Episode Summary    Current INR goal:  2.0-3.0  TTR:  58.6 % (7.4 mo)  Next INR check:  07/04/2020  INR from last check:    Weekly max warfarin dose:    Target end date:  Indefinite  INR check location:    Preferred lab:    Send INR reminders to:     Indications   Long term (current) use of anticoagulants [Z79.01] History of DVT (deep vein thrombosis) [Z86.718] Pulmonary embolism on long-term anticoagulation therapy (HCC) [I26.99 T45.515A] History of pulmonary embolism [Z86.711]       Comments:          No Known Allergies  Current Outpatient Medications:    alendronate (FOSAMAX) 70 MG tablet, Take 1 tablet (70 mg total) by mouth every 7 (seven) days. Take with a full glass of water on an empty stomach., Disp: 12 tablet, Rfl: 3   amLODipine (NORVASC) 5 MG tablet, Take 1 tablet (5 mg total) by mouth daily., Disp: 30 tablet, Rfl: 11   Ascorbic Acid (VITAMIN C) 500 MG CAPS, Take 500 mg by mouth daily., Disp: , Rfl:    BREO ELLIPTA 200-25 MCG/INH AEPB, INHALE 1 PUFF INTO THE LUNGS DAILY (Patient taking differently: Inhale 1 puff into the lungs daily. ), Disp: 60 each, Rfl: 2   cholecalciferol (VITAMIN D3) 25 MCG (1000 UT) tablet, Take 1,000 Units by mouth daily., Disp: , Rfl:    famotidine (PEPCID) 20 MG tablet, Take 1 tablet (20 mg total) by mouth 2 (two) times daily., Disp: 180 tablet, Rfl: 1   furosemide (LASIX) 20 MG tablet, Take 2 tablets (40 mg total) by mouth daily. TAKE 1 TABLET(20 MG) BY MOUTH DAILY  AS NEEDED FOR FLUID RETENTION OR SWELLING, Disp: 90 tablet, Rfl: 0   furosemide (LASIX) 40 MG tablet, Take 1 tablet (40 mg total) by mouth 2 (two) times daily., Disp: 60 tablet, Rfl: 5   losartan-hydrochlorothiazide (HYZAAR) 50-12.5 MG tablet, Take 1 tablet by mouth daily., Disp: , Rfl:    metolazone (ZAROXOLYN) 2.5 MG tablet, Take 1 tablet every other day x 1 week, Disp: 5 tablet, Rfl: 0   omega-3 acid ethyl esters (LOVAZA) 1 g capsule, Take 1 capsule (1 g total) by mouth 2 (two) times daily. (Patient taking differently: Take 1 g by mouth daily. ), Disp: 30 capsule, Rfl: 3   Potassium Chloride ER 20 MEQ TBCR, TAKE 1 TABLET BY MOUTH DAILY (Patient taking differently: Take 20 mEq by mouth daily. ), Disp: 90 tablet, Rfl: 0   warfarin (COUMADIN) 4 MG tablet, Take one-and-one-half (1 & 1/2)  tablets on Mondays and Thursdays. All other day, take one (1) tablet, Disp: 96 tablet, Rfl: 1   zinc gluconate 50 MG tablet, Take 50 mg by mouth daily., Disp: , Rfl:    cyclobenzaprine (FLEXERIL) 5 MG tablet, TAKE 1 TABLET(5 MG) BY MOUTH THREE TIMES DAILY FOR UP TO 10 DAYS AS NEEDED FOR MUSCLE SPASMS, Disp: 15 tablet, Rfl: 0 Past Medical History:  Diagnosis Date   Acute on chronic respiratory failure with hypoxia ([place])    COVID-19  virus infection    DVT of lower extremity, bilateral (HCC)    Epistaxis    Hypertension    Incarcerated umbilical hernia 35/4656   Status post repair   Lupus anticoagulant positive    Per records from [facility]   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    Social History   Socioeconomic History   Marital status: Divorced    Spouse  name: Not on file   Number of children: Not on file   Years of education: Not on file   Highest education level: Not on file  Occupational History   Occupation: retired Pharmacist, hospital  Tobacco Use   Smoking status: Former Smoker    Packs/day: 1.50    Years: 23.00    Pack years: 34.50    Types: Cigarettes    Quit date: 1995    Years since quitting: 26.5   Smokeless tobacco: Never Used  Scientific laboratory technician Use: Never used  Substance and Sexual Activity   Alcohol use: Never   Drug use: Not Currently    Types: "Crack" cocaine    Comment: quit 2008   Sexual activity: Not on file  Other Topics Concern   Not on file  Social History Narrative   Not on file   Social Determinants of Health   Financial Resource Strain: Low Risk    Difficulty of Paying Living Expenses: Not very hard  Food Insecurity:    Worried About Charity fundraiser in the Last Year:    Arboriculturist in the Last Year:   Transportation Needs:    Film/video editor (Medical):    Lack of Transportation (Non-Medical):   Physical Activity:    Days of Exercise per Week:    Minutes of Exercise per Session:   Stress:    Feeling of Stress :   Social Connections:    Frequency of Communication with Friends and Family:    Frequency of Social Gatherings with Friends and Family:    Attends Religious Services:    Active Member of Clubs or Organizations:    Attends Archivist Meetings:    Marital Status:    Family History  Adopted: Yes    ASSESSMENT Recent Results: The most recent result is correlated with 30 mg per week: Lab Results  Component Value Date   INR 3.5 (A) 06/20/2020   INR 3.4 (A) 05/31/2020   INR 2.9 (H) 05/25/2020    Anticoagulation Dosing: Description   Take one of your 10m strength blue warfarin tablets, by mouth, once-daily at 6PM--EXCEPT on MONDAYS--take ONLY 1/2 tablet on MONDAYS.      INR today: Supratherapeutic  PLAN Weekly dose was decreased by  13% to 26 mg per week  Patient Instructions  Patient instructed to take medications as defined in the Anti-coagulation Track section of this encounter.  Patient instructed to take today's dose.  Patient instructed to take one of your 468mstrength blue warfarin tablets, by mouth, once-daily at 6PM--EXCEPT on MONDAYS--take ONLY 1/2 tablet on MONDAYS.  Patient verbalized understanding of these instructions.    Patient advised to contact clinic or seek medical attention if  signs/symptoms of bleeding or thromboembolism occur.  Patient verbalized understanding by repeating back information and was advised to contact me if further medication-related questions arise. Patient was also provided an information handout.  Follow-up Return in 2 weeks (on 07/04/2020) for Follow up INR.  Jesus Russell  15 minutes spent face-to-face with the patient during the encounter. 50% of time spent on education, including signs/sx bleeding and clotting, as well as food and drug interactions with warfarin. 50% of time was spent on fingerprick POC INR sample collection,processing, results determination, and documentation in [email or web address].net/.

## 2020-06-20 NOTE — Patient Instructions (Addendum)
It is good to talk with you today. We will order labs for tomorrow at the [facility] Pulmonary Office. ( BMET, and BNP) We will call you with the results Continue Lasix 40 mg daily for now. Take your last dose of Zaroxolyn  6/29/as scheduled.  Continue low salt diet. Continue to elevate feet as able for your ankle swelling.  Continue wearing your oxygen as needed for shortness of breath and to maintain oxygen saturations > 92% Low oxgen levels are bad for your heart and brain. Follow up with cardiology 7/7/as is scheduled. Follow up with [facility] Dr. Shearon Stalls in 2-3 weeks to ensure you are doing well. Call the office if you need [place] before your appointment with Dr. Aundra Dubin.  Please contact office for sooner follow up if symptoms do not improve or worsen or seek emergency care

## 2020-06-20 NOTE — Progress Notes (Signed)
INTERNAL MEDICINE TEACHING ATTENDING ADDENDUM  I agree with pharmacy recommendations as outlined in their note.   Oda Kilts, MD

## 2020-06-20 NOTE — Progress Notes (Signed)
Virtual Visit via Telephone Note  I connected with Jesus Russell on 06/20/20 at 10:00 AM EDT by telephone and verified that I am speaking with the correct person using two identifiers.  Location: Patient: At Home Provider: Working Remotely from home   I discussed the limitations, risks, security and privacy concerns of performing an evaluation and management service by telephone and the availability of in person appointments. I also discussed with the patient that there may be a patient responsible charge related to this service. The patient expressed understanding and agreed to proceed.  Synopsis 69 year old male, former smoker quit 1995 (84-pack-year history).  Past medical history significant for sarcoidosis, obstructive sleep apnea, pulmonary embolism (on long-term anticoagulation), pretension, right heart failure, hepatic steatosis.  Patient of Dr. Shearon Stalls, last seen on 05/26/20.  Patient recently hospitalized from 5/28-6/2 due to hypoxic, hypercapnic  respiratory failure. This was felt to be 2/2 underlying sleep disorder.Currently on prednisone 20 mg daily for his sarcoidosis, and Lasix 40 mg for heart failure.   Last OV 06/13/2020 He was maintained on prednisone 20 mg daily for burned-out sarcoidosis.  Plan had been to taper this due to worsening fluid retention.  Lasix increased during last visit to 40 mg twice daily.  Referred to cardiology for acute on chronic right ventricular heart failure.  He was ordered for split-night sleep study for suspected underlying OSA, this has not been completed yet. He follows with Coumadin clinic for anticoagulation. Patient has appointment with cardiology July 26. Shortness of breath was a little worse due to abdominal bloating.He is compliant with Breo, oxygen baseline has increased from 2L to 3 L. With adequate sats. He was tapering off prednisone and was on 85m daily, he had 2-3 days left  then stopped. His weight is up 10 lbs since last visit. He will see  coumadin clinic on 6/28. He is taking 4516mdaily and once a week on Mondays he takes 16m34m  History of Present Illness: Pt. States he has been compliant with both his Lasix and the Zaroxolyn every other day. He states he continues to have swollen feet. He states they   are better than they were last week, but not back to baseline .  He states he does have some improvement in his breathing. He states he does have occasional chest tightness, but it self resolves. He knows to seek emergency care with chest pain that does not self resolve in 1-2 minutes. BNP last week was 43. CO2 on BMET was 36, which is most likely the reason for the addition of the Zaroxolyn. He has initial consult with Cardiology ( Dr. McLAundra Dubinon 06/29/2020 for his right sided heart failure. He is off his prednisone, which should help with the fluid retention . He states he is following a low sodium diet. He states he is using his oxygen at 2-3 L prn. We had a long discussion about checking his oxygen sats and never dropping below 90%. We discussed that this can be very stressful to his heart which is already having  issues of failure. He is taking 20 mEq of potassium daily.He does not have home scales, which he needs to purchase to help monitor his weight daily. He did not have labs drawn today prior to this visit.  We will check BMET and BNP and get him to cards 7/7 where they can assume management of his heart failure.     Observations/Objective: Right heart Failure  Continued fluid overload despite addition of Zaroxolyn  Speaking in full sentences  05/21/2020 Echo Left ventricular ejection fraction, by estimation, is 55 to 60%. The  left ventricle has normal function. The left ventricle has no regional  wall motion abnormalities. Left ventricular diastolic parameters are  consistent with Grade I diastolic  dysfunction (impaired relaxation). There is the interventricular septum is  flattened in systole and diastole, consistent with  right ventricular  pressure and volume overload.  2. Right ventricular systolic function is severely reduced. The right  ventricular size is severely enlarged. Tricuspid regurgitation signal is  inadequate for assessing PA pressure.  3. The mitral valve is grossly normal. No evidence of mitral valve  regurgitation. No evidence of mitral stenosis.  4. The aortic valve is tricuspid. Aortic valve regurgitation is not  visualized. Mild to moderate aortic valve sclerosis/calcification is  present, without any evidence of aortic stenosis.  5. The inferior vena cava is dilated in size with <50% respiratory  variability, suggesting right atrial pressure of 15 mmHg.   Conclusion(s)/Recommendation(s): Findings consistent with Cor Pulmonale.    Assessment and Plan: Right Heart Failure Continued fluid overload despite addition of Zaroxolyn QOD  We will order labs for tomorrow at the [facility] Office. ( BMET, and BNP) We will call you with the results Continue Lasix 40 mg daily for now. Take your last dose of Zaroxolyn 2.5 mg on  6/29/as scheduled.  Continue low salt diet. Continue to elevate feet as able for your ankle swelling.  Continue wearing your oxygen as needed for shortness of breath and to maintain oxygen saturations > 92% Please purchase bedside caled to monitor your weight daily. Follow up with cardiology 7/7/as is scheduled. Follow up with [facility] Dr. Shearon Stalls in 2-3 weeks to ensure you are doing well.  Sarcoidosis Weaned off prednisone Suspect dyspnea is multifactorial, including heart failure, deconditioning Plan Continue wearing oxygen to maintain oxygen saturations > 92% Remember low oxygen levels are bad for your heart and brain.  Follow up in 2 - 3 weeks  with Dr. Shearon Stalls or Derl Barrow NP to ensure you are doing better.   Follow Up Instructions: Follow up with Derl Barrow NP or Dr. Shearon Stalls in 2-3 weeks.   I discussed the assessment and treatment plan with the  patient. The patient was provided an opportunity to ask questions and all were answered. The patient agreed with the plan and demonstrated an understanding of the instructions.   The patient was advised to call back or seek an in-person evaluation if the symptoms worsen or if the condition fails to improve as anticipated.  I provided 45  minutes of non-face-to-face time during this encounter.   Magdalen Spatz, NP 06/20/2020 4:48 PM

## 2020-06-21 ENCOUNTER — Telehealth: Payer: Self-pay | Admitting: Acute Care

## 2020-06-21 NOTE — Telephone Encounter (Signed)
Patient contacted, no labs were ordered or mentioned in last OV note. Made follow up appointment as recommended by provider.

## 2020-06-22 ENCOUNTER — Ambulatory Visit (HOSPITAL_COMMUNITY)
Admission: RE | Admit: 2020-06-22 | Discharge: 2020-06-22 | Disposition: A | Discharge status: Home / Self Care | Payer: Medicare Other | Source: Ambulatory Visit | Attending: Interventional Radiology | Admitting: Interventional Radiology

## 2020-06-22 ENCOUNTER — Other Ambulatory Visit: Payer: Self-pay

## 2020-06-22 DIAGNOSIS — S22000A Wedge compression fracture of unspecified thoracic vertebra, initial encounter for closed fracture: Secondary | ICD-10-CM | POA: Insufficient documentation

## 2020-06-22 MED ORDER — GADOBUTROL 1 MMOL/ML IV SOLN
10.0000 mL | Freq: Once | INTRAVENOUS | Status: AC | PRN
  Administered 2020-06-22: 10 mL via INTRAVENOUS

## 2020-06-29 ENCOUNTER — Other Ambulatory Visit: Payer: Self-pay

## 2020-06-29 ENCOUNTER — Other Ambulatory Visit (HOSPITAL_COMMUNITY): Payer: Self-pay | Admitting: Interventional Radiology

## 2020-06-29 ENCOUNTER — Encounter (HOSPITAL_COMMUNITY): Payer: Self-pay | Admitting: Cardiology

## 2020-06-29 ENCOUNTER — Telehealth (HOSPITAL_COMMUNITY): Payer: Self-pay

## 2020-06-29 ENCOUNTER — Ambulatory Visit (HOSPITAL_COMMUNITY)
Admission: RE | Admit: 2020-06-29 | Discharge: 2020-06-29 | Disposition: A | Discharge status: Home / Self Care | Payer: Medicare Other | Source: Ambulatory Visit | Attending: Cardiology | Admitting: Cardiology

## 2020-06-29 VITALS — BP 134/96 | HR 86 | Wt 225.4 lb

## 2020-06-29 DIAGNOSIS — I50812 Chronic right heart failure: Secondary | ICD-10-CM | POA: Diagnosis not present

## 2020-06-29 DIAGNOSIS — Z8616 Personal history of COVID-19: Secondary | ICD-10-CM | POA: Diagnosis not present

## 2020-06-29 DIAGNOSIS — Z86718 Personal history of other venous thrombosis and embolism: Secondary | ICD-10-CM | POA: Insufficient documentation

## 2020-06-29 DIAGNOSIS — Z79899 Other long term (current) drug therapy: Secondary | ICD-10-CM | POA: Insufficient documentation

## 2020-06-29 DIAGNOSIS — Z7901 Long term (current) use of anticoagulants: Secondary | ICD-10-CM | POA: Insufficient documentation

## 2020-06-29 DIAGNOSIS — R140 Abdominal distension (gaseous): Secondary | ICD-10-CM | POA: Insufficient documentation

## 2020-06-29 DIAGNOSIS — T45515A Adverse effect of anticoagulants, initial encounter: Secondary | ICD-10-CM

## 2020-06-29 DIAGNOSIS — D869 Sarcoidosis, unspecified: Secondary | ICD-10-CM | POA: Insufficient documentation

## 2020-06-29 DIAGNOSIS — D6862 Lupus anticoagulant syndrome: Secondary | ICD-10-CM | POA: Insufficient documentation

## 2020-06-29 DIAGNOSIS — R0600 Dyspnea, unspecified: Secondary | ICD-10-CM | POA: Insufficient documentation

## 2020-06-29 DIAGNOSIS — R0601 Orthopnea: Secondary | ICD-10-CM | POA: Insufficient documentation

## 2020-06-29 DIAGNOSIS — R0602 Shortness of breath: Secondary | ICD-10-CM | POA: Insufficient documentation

## 2020-06-29 DIAGNOSIS — M545 Low back pain: Secondary | ICD-10-CM | POA: Diagnosis not present

## 2020-06-29 DIAGNOSIS — I2699 Other pulmonary embolism without acute cor pulmonale: Secondary | ICD-10-CM

## 2020-06-29 DIAGNOSIS — Z86711 Personal history of pulmonary embolism: Secondary | ICD-10-CM | POA: Insufficient documentation

## 2020-06-29 DIAGNOSIS — G4733 Obstructive sleep apnea (adult) (pediatric): Secondary | ICD-10-CM | POA: Diagnosis not present

## 2020-06-29 DIAGNOSIS — S22060A Wedge compression fracture of T7-T8 vertebra, initial encounter for closed fracture: Secondary | ICD-10-CM

## 2020-06-29 DIAGNOSIS — I272 Pulmonary hypertension, unspecified: Secondary | ICD-10-CM | POA: Insufficient documentation

## 2020-06-29 DIAGNOSIS — Z87891 Personal history of nicotine dependence: Secondary | ICD-10-CM | POA: Diagnosis not present

## 2020-06-29 LAB — BASIC METABOLIC PANEL
Anion gap: 9 (ref 5–15)
BUN: 16 mg/dL (ref 8–23)
CO2: 35 mmol/L — ABNORMAL HIGH (ref 22–32)
Calcium: 8.8 mg/dL — ABNORMAL LOW (ref 8.9–10.3)
Chloride: 96 mmol/L — ABNORMAL LOW (ref 98–111)
Creatinine, Ser: 1.08 mg/dL (ref 0.61–1.24)
GFR calc Af Amer: >60 mL/min (ref >60)
GFR calc non Af Amer: >60 mL/min (ref >60)
Glucose, Bld: 110 mg/dL — ABNORMAL HIGH (ref 70–99)
Potassium: 3.1 mmol/L — ABNORMAL LOW (ref 3.5–5.1)
Sodium: 140 mmol/L (ref 135–145)

## 2020-06-29 LAB — CBC
HCT: 35.6 % — ABNORMAL LOW (ref 39.0–52.0)
Hemoglobin: 10.3 g/dL — ABNORMAL LOW (ref 13.0–17.0)
MCH: 26.9 pg (ref 26.0–34.0)
MCHC: 28.9 g/dL — ABNORMAL LOW (ref 30.0–36.0)
MCV: 93 fL (ref 80.0–100.0)
Platelets: 457 10*3/uL — ABNORMAL HIGH (ref 150–400)
RBC: 3.83 MIL/uL — ABNORMAL LOW (ref 4.22–5.81)
RDW: 17.8 % — ABNORMAL HIGH (ref 11.5–15.5)
WBC: 10.6 10*3/uL — ABNORMAL HIGH (ref 4.0–10.5)
nRBC: 0 % (ref 0.0–0.2)

## 2020-06-29 MED ORDER — TORSEMIDE 20 MG PO TABS: 80.0000 mg | ORAL_TABLET | Freq: Every day | ORAL | 3 refills | Status: DC

## 2020-06-29 NOTE — Patient Instructions (Addendum)
Labs done today. We will contact you only if your labs are abnormal.  STOP TAKING Fureosmide(Lasix)  START TAKING Torsemide 1m(4 tablets) daily.  No other medication changes were made. Please continue all other medication as prescribed.  Your physician recommends that you schedule a follow-up appointment in: 10 days for a lab only appointment at 11am here in our office and [facility] ,3 week appointment with Dr. MAundra Dubin  You must get a COVID-19 SCREEN DONE  PRIOR TO YOUR VQ SCAN. THIS HAS BEEN SCHEDULED FOR Saturday July 17TH BETWEEN THE HOURS OF 9-12PM. AFTER TEST IS COMPLETE YOU MUST SELF QUARANTINE UNTIL YOUR APPOINTMENT.   If you have any questions or concerns before your next appointment please send u[place] message through m[place]or call our office at [phone]    TO LEAVE A MESSAGE FOR THE NURSE SELECT OPTION 2, PLEASE LEAVE A MESSAGE INCLUDING: . YOUR NAME . DATE OF BIRTH . CALL BACK NUMBER . REASON FOR CALL**this is important as we prioritize the call backs  YCliveAS LONG AS YOU CALL BEFORE 4:00 PM   You are scheduled for a Cardiac Catheterization on Wednesday, July 14 with Dr. DLoralie Champagne  1. [facility] [address] [place] [id number] 12:30 PM (This time is two hours before your procedure to ensure your preparation). Free valet parking service is available.   Special note: Every effort is made to have your procedure done on time. Please understand that emergencies sometimes delay scheduled procedures.  2. Diet: Do not eat solid foods after midnight.  The patient may have clear liquids until 5am upon the day of the procedure.   3. Medication instructions in preparation for your procedure.  PLEASE HOLD WARFARIN THE DAY BEFORE YOUR CATH AND THE DAY OF YOUR CATH. (Tuesday AND Wednesday)   On the  morning of your procedure DO NOT TAKE YOUR TORSEMIDE ,HOWEVER you may take any morning medicines NOT listed above.  You may use sips of water.  4. Plan for one night stay--bring personal belongings. 5. Bring a current list of your medications and current insurance cards. 6. You MUST have a responsible person to drive you home. 7. Someone MUST be with you the first 24 hours after you arrive home or your discharge will be delayed. 8. Please wear clothes that are easy to get on and off and wear slip-on shoes.   Do the following things EVERYDAY: 1) Weigh yourself in the morning before breakfast. Write it down and keep it in a log. 2) Take your medicines as prescribed 3) Eat low salt foods--Limit salt (sodium) to 2000 mg per day.  4) Stay as active as you can everyday 5) Limit all fluids for the day to less than 2 liters   [facility] you and your health needs are our priority. As part of our continuing mission to provide you with exceptional heart care, we have created designated Provider Care Teams. These Care Teams include your primary Cardiologist (physician) and Advanced Practice Providers (APPs- Physician Assistants and Nurse Practitioners) who all work together to provide you with the care you need, when you need it.   You may see any of the following providers on your designated Care Team at your next follow up: .Marland KitchenDr DGlori Bickers. Dr DKirk Ruths  [place] . Darrick Grinder, NP . Lyda Jester, PA . Audry Riles, PharmD   Please be sure to bring in all your medications bottles to every appointment.

## 2020-06-29 NOTE — Telephone Encounter (Signed)
Called to schedule kyphoplasty, no answer, left vm. AW

## 2020-06-29 NOTE — Telephone Encounter (Signed)
Called to see if pt wanted to schedule his kyphoplasty. He is currently at his cardiologist office and he has a lot of other issue going on with fluid build up. He would like to postpone for now while he takes care of his heart issues. He would like for me to check back with him in about 4 weeks. AW

## 2020-06-30 NOTE — H&P (View-Only) (Signed)
PCP: Madalyn Rob, MD HF Cardiology: Dr. Aundra Dubin  69 y.o. with history of sarcoidosis, prior PE, RV failure was referred by Dr. Shearon Stalls for evaluation of CHF/pulmonary hypertension.  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  Patient was on prednisone for a period of time, this was stopped earlier this year.  He is on 2 L home oxygen.  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 He has had recurrent PEs/DVTs.  He has an IVC filter.  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  Most recent PE was in 2/21 when he had been off warfarin transiently.  Last echo in 5/21 showed EF 55-60% with D-shaped septum and severely dilated/severely dysfunctional RV.  No estimation of PA pressure was available.    Patient has had 6-7 weeks of increased exertional dyspnea.  He is now short of breath just walking around his house.  +Abdominal distention.  He has orthopnea, sleeps in chair.  No chest pain. No lightheadedness/syncope.  No palpitations.  He also has significant low back pain from compression fractures.  He has had kyphoplasty and is in need of additional kyphoplasty.  He has been started on Lasix 40 mg bid and took metolazone 5 mg every other day for a week.  He is still swollen and short of breath.   ECG: NSR, RBBB (personally reviewed)  Labs (6/21): pro-BNP 43, K 4.6, creatinine 0.89  PMH: 1. Sarcoidosis: "Burned out."  Diagnosed in 1990 with bronchoscopy/biopsy.  Treated with prednisone, now off.  - On 2 L home oxygen chronically.  2. 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 H/o DVT and PE.  Has lupus anticoagulant.   - Most recent PE was in 2/21, he had been off warfarin transiently.  4. COVID-19 infection 1/21.  5. H/o compression fractures in 2/21.  - Had kyphoplasty 6. RV failure/pulmonary hypertension:  - Echo (5/21) with EF 55-60%, D-shaped interventricular septum, severely dilated RV with severe systolic  dysfunction, dilated IVC.  7. HTN 8. History of subdural hematoma remotely.   Social History   Socioeconomic History  . Marital status: Divorced    Spouse name: Not on file  . Number of children: Not on file  . Years of education: Not on file  . Highest education level: Not on file  Occupational History  . Occupation: retired Pharmacist, hospital  Tobacco Use  . Smoking status: Former Smoker    Packs/day: 1.50    Years: 23.00    Pack years: 34.50    Types: Cigarettes    Quit date: 1995    Years since quitting: 26.5  . Smokeless tobacco: Never Used  Vaping Use  . Vaping Use: Never used  Substance and Sexual Activity  . Alcohol use: Never  . Drug use: Not Currently    Types: "Crack" cocaine    Comment: quit 2008  . Sexual activity: Not on file  Other Topics Concern  . Not on file  Social History Narrative  . Not on file   Social Determinants of Health   Financial Resource Strain: Low Risk   . Difficulty of Paying Living Expenses: Not very hard  Food Insecurity:   . Worried About Charity fundraiser in the Last Year:   . Arboriculturist in the Last Year:   Transportation Needs:   . Film/video editor (Medical):   Marland Kitchen Lack of Transportation (Non-Medical):   Physical Activity:   . Days of Exercise per Week:   .  Minutes of Exercise per Session:   Stress:   . Feeling of Stress :   Social Connections:   . Frequency of Communication with Friends and Family:   . Frequency of Social Gatherings with Friends and Family:   . Attends Religious Services:   . Active Member of Clubs or Organizations:   . Attends Archivist Meetings:   Marland Kitchen Marital Status:   Intimate Partner Violence:   . Fear of Current or Ex-Partner:   . Emotionally Abused:   Marland Kitchen Physically Abused:   . Sexually Abused:    Family History  Adopted: Yes   ROS: All systems reviewed and negative except as per HPI.   Current Outpatient Medications  Medication Sig Dispense Refill  . alendronate (FOSAMAX) 70  MG tablet Take 1 tablet (70 mg total) by mouth every 7 (seven) days. Take with a full glass of water on an empty stomach. (Patient taking differently: Take 70 mg by mouth every 7 (seven) days. Take with a full glass of water on an empty stomach. Monday) 12 tablet 3  . amLODipine (NORVASC) 5 MG tablet Take 1 tablet (5 mg total) by mouth daily. 30 tablet 11  . Ascorbic Acid (VITAMIN C) 500 MG CAPS Take 500 mg by mouth daily.    . cholecalciferol (VITAMIN D3) 25 MCG (1000 UT) tablet Take 1,000 Units by mouth daily.    Marland Kitchen losartan-hydrochlorothiazide (HYZAAR) 50-12.5 MG tablet Take 1 tablet by mouth daily.    Marland Kitchen omega-3 acid ethyl esters (LOVAZA) 1 g capsule Take 1 g by mouth 2 (two) times daily.    . potassium chloride SA (KLOR-CON) 20 MEQ tablet Take 20 mEq by mouth daily.    Marland Kitchen warfarin (COUMADIN) 4 MG tablet Take one-and-one-half (1 & 1/2)  tablets on Mondays and Thursdays. All other day, take one (1) tablet (Patient taking differently: Take 2-4 mg by mouth See admin instructions. MONDAY 2 mg.  All the other days take 4 mg) 96 tablet 1  . zinc gluconate 50 MG tablet Take 50 mg by mouth daily.    Marland Kitchen torsemide (DEMADEX) 20 MG tablet Take 4 tablets (80 mg total) by mouth daily. 360 tablet 3   No current facility-administered medications for this encounter.   BP (!) 134/96   Pulse 86   Wt 102.2 kg (225 lb 6.4 oz)   SpO2 98% Comment: 3L of O2  BMI 33.29 kg/m  General: NAD Neck: JVP 12 cm, no thyromegaly or thyroid nodule.  Lungs: Crackles at bases bilaterally. CV: Nondisplaced PMI.  Heart regular S1/S2 with widely split S2, no S3/S4, no murmur.  2+ edema to knees.  No carotid bruit.  Normal pedal pulses.  Abdomen: Soft, nontender, no hepatosplenomegaly, no distention.  Skin: Intact without lesions or rashes.  Neurologic: Alert and oriented x 3.  Psych: Normal affect. Extremities: No clubbing or cyanosis.  HEENT: Normal.   Assessment/Plan: 1. RV failure, suspected pulmonary hypertension: Echo  in 5/21 showed EF 55-60%, D-shaped septum with severe RV dilation/severe RV dysfunction.  PA pressure was not able to be estimated.   I am suspicious that the patient has severe pulmonary hypertension, possibly due to pulmonary sarcoidosis (group 5 PH) or alternatively due to chronic thromboembolic disease, group 4 PH (h/o recurrent PEs).  He is volume overloaded on exam with NYHA class IIIb symptoms.  - Stop Lasix.  - Start torsemide 80 mg daily. BMET today and again in 10 days.  - I will arrange for RHC to assess PA  pressure and filling pressures. We discussed risks/benefits and he agrees to procedure.  Will continue warfarin given significant VTE risk with holding warfarin (will try to keep INR 2-2.5 at time of cath).  - I will arrange for V/Q scan to assess for chronic PEs.  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  Has positive lupus anticoagulant.  - Continue warfarin, should have Lovenox bridge if stops warfarin (had PE when off warfarin transiently in 2/21).  3. Sarcoidosis: History of "burned out" pulmonary sarcoidosis.  This may be the cause of his pulmonary hypertension.  No definitive evidence for cardiac sarcoidosis, but could arrange for cardiac MRI in the future to investigate. He is no longer on prednisone.  - Continue 2 L home oxygen.  4. HTN: Continue current regimen.  5. OSA: Use CPAP.   Loralie Champagne 06/30/2020

## 2020-06-30 NOTE — Progress Notes (Signed)
PCP: Steen, James, MD HF Cardiology: Dr. Jaskarn Schweer  69 y.o. with history of sarcoidosis, prior PE, RV failure was referred by Dr. Desai for evaluation of CHF/pulmonary hypertension.  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  Patient was on prednisone for a period of time, this was stopped earlier this year.  He is on 2 L home oxygen.  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 He has had recurrent PEs/DVTs.  He has an IVC filter.  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  Most recent PE was in 2/21 when he had been off warfarin transiently.  Last echo in 5/21 showed EF 55-60% with D-shaped septum and severely dilated/severely dysfunctional RV.  No estimation of PA pressure was available.    Patient has had 6-7 weeks of increased exertional dyspnea.  He is now short of breath just walking around his house.  +Abdominal distention.  He has orthopnea, sleeps in chair.  No chest pain. No lightheadedness/syncope.  No palpitations.  He also has significant low back pain from compression fractures.  He has had kyphoplasty and is in need of additional kyphoplasty.  He has been started on Lasix 40 mg bid and took metolazone 5 mg every other day for a week.  He is still swollen and short of breath.   ECG: NSR, RBBB (personally reviewed)  Labs (6/21): pro-BNP 43, K 4.6, creatinine 0.89  PMH: 1. Sarcoidosis: "Burned out."  Diagnosed in 1990 with bronchoscopy/biopsy.  Treated with prednisone, now off.  - On 2 L home oxygen chronically.  2. 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 H/o DVT and PE.  Has lupus anticoagulant.   - Most recent PE was in 2/21, he had been off warfarin transiently.  4. COVID-19 infection 1/21.  5. H/o compression fractures in 2/21.  - Had kyphoplasty 6. RV failure/pulmonary hypertension:  - Echo (5/21) with EF 55-60%, D-shaped interventricular septum, severely dilated RV with severe systolic  dysfunction, dilated IVC.  7. HTN 8. History of subdural hematoma remotely.   Social History   Socioeconomic History  . Marital status: Divorced    Spouse name: Not on file  . Number of children: Not on file  . Years of education: Not on file  . Highest education level: Not on file  Occupational History  . Occupation: retired teacher  Tobacco Use  . Smoking status: Former Smoker    Packs/day: 1.50    Years: 23.00    Pack years: 34.50    Types: Cigarettes    Quit date: 1995    Years since quitting: 26.5  . Smokeless tobacco: Never Used  Vaping Use  . Vaping Use: Never used  Substance and Sexual Activity  . Alcohol use: Never  . Drug use: Not Currently    Types: "Crack" cocaine    Comment: quit 2008  . Sexual activity: Not on file  Other Topics Concern  . Not on file  Social History Narrative  . Not on file   Social Determinants of Health   Financial Resource Strain: Low Risk   . Difficulty of Paying Living Expenses: Not very hard  Food Insecurity:   . Worried About Running Out of Food in the Last Year:   . Ran Out of Food in the Last Year:   Transportation Needs:   . Lack of Transportation (Medical):   . Lack of Transportation (Non-Medical):   Physical Activity:   . Days of Exercise per Week:   .   Minutes of Exercise per Session:   Stress:   . Feeling of Stress :   Social Connections:   . Frequency of Communication with Friends and Family:   . Frequency of Social Gatherings with Friends and Family:   . Attends Religious Services:   . Active Member of Clubs or Organizations:   . Attends Club or Organization Meetings:   . Marital Status:   Intimate Partner Violence:   . Fear of Current or Ex-Partner:   . Emotionally Abused:   . Physically Abused:   . Sexually Abused:    Family History  Adopted: Yes   ROS: All systems reviewed and negative except as per HPI.   Current Outpatient Medications  Medication Sig Dispense Refill  . alendronate (FOSAMAX) 70  MG tablet Take 1 tablet (70 mg total) by mouth every 7 (seven) days. Take with a full glass of water on an empty stomach. (Patient taking differently: Take 70 mg by mouth every 7 (seven) days. Take with a full glass of water on an empty stomach. Monday) 12 tablet 3  . amLODipine (NORVASC) 5 MG tablet Take 1 tablet (5 mg total) by mouth daily. 30 tablet 11  . Ascorbic Acid (VITAMIN C) 500 MG CAPS Take 500 mg by mouth daily.    . cholecalciferol (VITAMIN D3) 25 MCG (1000 UT) tablet Take 1,000 Units by mouth daily.    . losartan-hydrochlorothiazide (HYZAAR) 50-12.5 MG tablet Take 1 tablet by mouth daily.    . omega-3 acid ethyl esters (LOVAZA) 1 g capsule Take 1 g by mouth 2 (two) times daily.    . potassium chloride SA (KLOR-CON) 20 MEQ tablet Take 20 mEq by mouth daily.    . warfarin (COUMADIN) 4 MG tablet Take one-and-one-half (1 & 1/2)  tablets on Mondays and Thursdays. All other day, take one (1) tablet (Patient taking differently: Take 2-4 mg by mouth See admin instructions. MONDAY 2 mg.  All the other days take 4 mg) 96 tablet 1  . zinc gluconate 50 MG tablet Take 50 mg by mouth daily.    . torsemide (DEMADEX) 20 MG tablet Take 4 tablets (80 mg total) by mouth daily. 360 tablet 3   No current facility-administered medications for this encounter.   BP (!) 134/96   Pulse 86   Wt 102.2 kg (225 lb 6.4 oz)   SpO2 98% Comment: 3L of O2  BMI 33.29 kg/m  General: NAD Neck: JVP 12 cm, no thyromegaly or thyroid nodule.  Lungs: Crackles at bases bilaterally. CV: Nondisplaced PMI.  Heart regular S1/S2 with widely split S2, no S3/S4, no murmur.  2+ edema to knees.  No carotid bruit.  Normal pedal pulses.  Abdomen: Soft, nontender, no hepatosplenomegaly, no distention.  Skin: Intact without lesions or rashes.  Neurologic: Alert and oriented x 3.  Psych: Normal affect. Extremities: No clubbing or cyanosis.  HEENT: Normal.   Assessment/Plan: 1. RV failure, suspected pulmonary hypertension: Echo  in 5/21 showed EF 55-60%, D-shaped septum with severe RV dilation/severe RV dysfunction.  PA pressure was not able to be estimated.   I am suspicious that the patient has severe pulmonary hypertension, possibly due to pulmonary sarcoidosis (group 5 PH) or alternatively due to chronic thromboembolic disease, group 4 PH (h/o recurrent PEs).  He is volume overloaded on exam with NYHA class IIIb symptoms.  - Stop Lasix.  - Start torsemide 80 mg daily. BMET today and again in 10 days.  - I will arrange for RHC to assess PA   pressure and filling pressures. We discussed risks/benefits and he agrees to procedure.  Will continue warfarin given significant VTE risk with holding warfarin (will try to keep INR 2-2.5 at time of cath).  - I will arrange for V/Q scan to assess for chronic PEs.  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  Has positive lupus anticoagulant.  - Continue warfarin, should have Lovenox bridge if stops warfarin (had PE when off warfarin transiently in 2/21).  3. Sarcoidosis: History of "burned out" pulmonary sarcoidosis.  This may be the cause of his pulmonary hypertension.  No definitive evidence for cardiac sarcoidosis, but could arrange for cardiac MRI in the future to investigate. He is no longer on prednisone.  - Continue 2 L home oxygen.  4. HTN: Continue current regimen.  5. OSA: Use CPAP.   Shimika Ames 06/30/2020   

## 2020-06-30 NOTE — Addendum Note (Signed)
Encounter addended by: [name], MD on: 06/30/2020 11:57 PM  Actions taken: Clinical Note Signed, Level of Service modified

## 2020-07-01 ENCOUNTER — Other Ambulatory Visit (HOSPITAL_COMMUNITY): Payer: Self-pay | Admitting: *Deleted

## 2020-07-01 ENCOUNTER — Telehealth (HOSPITAL_COMMUNITY): Payer: Self-pay

## 2020-07-01 DIAGNOSIS — I50812 Chronic right heart failure: Secondary | ICD-10-CM

## 2020-07-01 MED ORDER — SODIUM CHLORIDE 0.9% FLUSH: 3.0000 mL | Freq: Two times a day (BID) | INTRAVENOUS | Status: DC

## 2020-07-01 MED ORDER — POTASSIUM CHLORIDE CRYS ER 20 MEQ PO TBCR: 40.0000 meq | EXTENDED_RELEASE_TABLET | Freq: Every day | ORAL | 5 refills | Status: DC

## 2020-07-01 NOTE — Telephone Encounter (Signed)
Patient advised and verbalized understanding. Med list updated to reflect changes. Pt already scheduled for a lab appt

## 2020-07-01 NOTE — Telephone Encounter (Signed)
-----  Message from [name], MD sent at 06/30/2020  1:31 PM EDT ----- Increase total daily KCl by 20 mEq. BMET 1 week.

## 2020-07-02 ENCOUNTER — Other Ambulatory Visit (HOSPITAL_COMMUNITY): Payer: Medicare Other

## 2020-07-04 ENCOUNTER — Ambulatory Visit: Payer: Medicare Other

## 2020-07-04 ENCOUNTER — Telehealth: Payer: Self-pay | Admitting: Pharmacist

## 2020-07-04 NOTE — Telephone Encounter (Signed)
Patient calls stating he cannot come to [facility] today for INR. States he is will take today's dose of warfarin and then OMIT/HOLD doses on Tuesday/Wednesday and undergo a cardiac cath by [facility]. These instructions he states were provided by [facility]

## 2020-07-06 ENCOUNTER — Encounter (HOSPITAL_COMMUNITY)
Admission: RE | Disposition: A | Discharge status: Home / Self Care | Payer: Self-pay | Source: Home / Self Care | Attending: Cardiology

## 2020-07-06 ENCOUNTER — Telehealth (HOSPITAL_COMMUNITY): Payer: Self-pay | Admitting: Pharmacist

## 2020-07-06 ENCOUNTER — Other Ambulatory Visit: Payer: Self-pay

## 2020-07-06 ENCOUNTER — Ambulatory Visit (HOSPITAL_COMMUNITY)
Admission: RE | Admit: 2020-07-06 | Discharge: 2020-07-06 | Disposition: A | Discharge status: Home / Self Care | Payer: Medicare Other | Attending: Cardiology | Admitting: Cardiology

## 2020-07-06 DIAGNOSIS — Z86711 Personal history of pulmonary embolism: Secondary | ICD-10-CM | POA: Diagnosis not present

## 2020-07-06 DIAGNOSIS — D6862 Lupus anticoagulant syndrome: Secondary | ICD-10-CM | POA: Insufficient documentation

## 2020-07-06 DIAGNOSIS — D869 Sarcoidosis, unspecified: Secondary | ICD-10-CM | POA: Insufficient documentation

## 2020-07-06 DIAGNOSIS — G4733 Obstructive sleep apnea (adult) (pediatric): Secondary | ICD-10-CM | POA: Diagnosis not present

## 2020-07-06 DIAGNOSIS — Z9981 Dependence on supplemental oxygen: Secondary | ICD-10-CM | POA: Insufficient documentation

## 2020-07-06 DIAGNOSIS — Z7901 Long term (current) use of anticoagulants: Secondary | ICD-10-CM | POA: Insufficient documentation

## 2020-07-06 DIAGNOSIS — Z87891 Personal history of nicotine dependence: Secondary | ICD-10-CM | POA: Diagnosis not present

## 2020-07-06 DIAGNOSIS — Z79899 Other long term (current) drug therapy: Secondary | ICD-10-CM | POA: Diagnosis not present

## 2020-07-06 DIAGNOSIS — I272 Pulmonary hypertension, unspecified: Secondary | ICD-10-CM

## 2020-07-06 DIAGNOSIS — I10 Essential (primary) hypertension: Secondary | ICD-10-CM | POA: Insufficient documentation

## 2020-07-06 DIAGNOSIS — Z8616 Personal history of COVID-19: Secondary | ICD-10-CM | POA: Diagnosis not present

## 2020-07-06 DIAGNOSIS — I2721 Secondary pulmonary arterial hypertension: Secondary | ICD-10-CM | POA: Insufficient documentation

## 2020-07-06 DIAGNOSIS — I50812 Chronic right heart failure: Secondary | ICD-10-CM

## 2020-07-06 HISTORY — PX: RIGHT HEART CATH: CATH118263

## 2020-07-06 LAB — POCT I-STAT EG7
Acid-Base Excess: 14 mmol/L — ABNORMAL HIGH (ref 0.0–2.0)
Acid-Base Excess: 14 mmol/L — ABNORMAL HIGH (ref 0.0–2.0)
Bicarbonate: 41 mmol/L — ABNORMAL HIGH (ref 20.0–28.0)
Bicarbonate: 41.4 mmol/L — ABNORMAL HIGH (ref 20.0–28.0)
Calcium, Ion: 1.15 mmol/L (ref 1.15–1.40)
Calcium, Ion: 1.17 mmol/L (ref 1.15–1.40)
HCT: 34 % — ABNORMAL LOW (ref 39.0–52.0)
HCT: 35 % — ABNORMAL LOW (ref 39.0–52.0)
Hemoglobin: 11.6 g/dL — ABNORMAL LOW (ref 13.0–17.0)
Hemoglobin: 11.9 g/dL — ABNORMAL LOW (ref 13.0–17.0)
O2 Saturation: 59 %
O2 Saturation: 64 %
Potassium: 3.4 mmol/L — ABNORMAL LOW (ref 3.5–5.1)
Potassium: 3.4 mmol/L — ABNORMAL LOW (ref 3.5–5.1)
Sodium: 138 mmol/L (ref 135–145)
Sodium: 139 mmol/L (ref 135–145)
TCO2: 43 mmol/L — ABNORMAL HIGH (ref 22–32)
TCO2: 43 mmol/L — ABNORMAL HIGH (ref 22–32)
pCO2, Ven: 65.3 mmHg — ABNORMAL HIGH (ref 44.0–60.0)
pCO2, Ven: 66.3 mmHg — ABNORMAL HIGH (ref 44.0–60.0)
pH, Ven: 7.404 (ref 7.250–7.430)
pH, Ven: 7.406 (ref 7.250–7.430)
pO2, Ven: 32 mmHg (ref 32.0–45.0)
pO2, Ven: 35 mmHg (ref 32.0–45.0)

## 2020-07-06 LAB — PROTIME-INR
INR: 1.8 — ABNORMAL HIGH (ref 0.8–1.2)
Prothrombin Time: 20.3 seconds — ABNORMAL HIGH (ref 11.4–15.2)

## 2020-07-06 LAB — POTASSIUM: Potassium: 3.7 mmol/L (ref 3.5–5.1)

## 2020-07-06 SURGERY — RIGHT HEART CATH
Anesthesia: LOCAL

## 2020-07-06 MED ORDER — ACETAMINOPHEN 325 MG PO TABS: 650.0000 mg | ORAL_TABLET | ORAL | Status: DC | PRN

## 2020-07-06 MED ORDER — HEPARIN (PORCINE) IN NACL 1000-0.9 UT/500ML-% IV SOLN
INTRAVENOUS | Status: AC
  Filled 2020-07-06: qty 1000

## 2020-07-06 MED ORDER — SODIUM CHLORIDE 0.9 % IV SOLN: INTRAVENOUS | Status: DC

## 2020-07-06 MED ORDER — SODIUM CHLORIDE 0.9 % IV SOLN: 250.0000 mL | INTRAVENOUS | Status: DC | PRN

## 2020-07-06 MED ORDER — LIDOCAINE HCL (PF) 1 % IJ SOLN
INTRAMUSCULAR | Status: DC | PRN
  Administered 2020-07-06: 2 mL

## 2020-07-06 MED ORDER — ONDANSETRON HCL 4 MG/2ML IJ SOLN: 4.0000 mg | Freq: Four times a day (QID) | INTRAMUSCULAR | Status: DC | PRN

## 2020-07-06 MED ORDER — LABETALOL HCL 5 MG/ML IV SOLN: 10.0000 mg | INTRAVENOUS | Status: DC | PRN

## 2020-07-06 MED ORDER — POTASSIUM CHLORIDE CRYS ER 20 MEQ PO TBCR: 40.0000 meq | EXTENDED_RELEASE_TABLET | Freq: Two times a day (BID) | ORAL | 5 refills | Status: DC

## 2020-07-06 MED ORDER — SODIUM CHLORIDE 0.9% FLUSH: 3.0000 mL | INTRAVENOUS | Status: DC | PRN

## 2020-07-06 MED ORDER — HEPARIN (PORCINE) IN NACL 1000-0.9 UT/500ML-% IV SOLN
INTRAVENOUS | Status: AC
  Filled 2020-07-06: qty 500

## 2020-07-06 MED ORDER — LIDOCAINE HCL (PF) 1 % IJ SOLN
INTRAMUSCULAR | Status: AC
  Filled 2020-07-06: qty 30

## 2020-07-06 MED ORDER — ASPIRIN 81 MG PO CHEW
81.0000 mg | CHEWABLE_TABLET | ORAL | Status: AC
  Administered 2020-07-06: 81 mg via ORAL
  Filled 2020-07-06: qty 1

## 2020-07-06 MED ORDER — HYDRALAZINE HCL 20 MG/ML IJ SOLN: 10.0000 mg | INTRAMUSCULAR | Status: DC | PRN

## 2020-07-06 MED ORDER — SODIUM CHLORIDE 0.9% FLUSH: 3.0000 mL | Freq: Two times a day (BID) | INTRAVENOUS | Status: DC

## 2020-07-06 MED ORDER — TORSEMIDE 20 MG PO TABS: ORAL_TABLET | ORAL | 3 refills | Status: DC

## 2020-07-06 MED ORDER — HEPARIN (PORCINE) IN NACL 1000-0.9 UT/500ML-% IV SOLN
INTRAVENOUS | Status: DC | PRN
  Administered 2020-07-06: 500 mL

## 2020-07-06 SURGICAL SUPPLY — 9 items
CATH BALLN WEDGE 5F 110CM (CATHETERS) ×2 IMPLANT
GUIDEWIRE .025 260CM (WIRE) ×2 IMPLANT
PACK CARDIAC CATHETERIZATION (CUSTOM PROCEDURE TRAY) ×2 IMPLANT
PROTECTION STATION PRESSURIZED (MISCELLANEOUS) ×2
SHEATH GLIDE SLENDER 4/5FR (SHEATH) ×2 IMPLANT
STATION PROTECTION PRESSURIZED (MISCELLANEOUS) ×1 IMPLANT
TRANSDUCER W/STOPCOCK (MISCELLANEOUS) ×2 IMPLANT
TUBING ART PRESS 72  MALE/FEM (TUBING) ×1
TUBING ART PRESS 72 MALE/FEM (TUBING) ×1 IMPLANT

## 2020-07-06 NOTE — Interval H&P Note (Signed)
History and Physical Interval Note:  07/06/2020 3:04 PM  Jesus Russell  has presented today for surgery, with the diagnosis of CHF.  The various methods of treatment have been discussed with the patient and family. After consideration of risks, benefits and other options for treatment, the patient has consented to  Procedure(s): RIGHT HEART CATH (N/A) as a surgical intervention.  The patient's history has been reviewed, patient examined, no change in status, stable for surgery.  I have reviewed the patient's chart and labs.  Questions were answered to the patient's satisfaction.     Onnika Siebel [employer]

## 2020-07-06 NOTE — Discharge Instructions (Signed)
May resume coumadin tonight 7/14  Brachial Site Care   This sheet gives you information about how to care for yourself after your procedure. Your health care provider may also give you more specific instructions. If you have problems or questions, contact your health care provider. What can I expect after the procedure? After the procedure, it is common to have:  Bruising and tenderness at the catheter insertion area. Follow these instructions at home: Medicines  Take over-the-counter and prescription medicines only as told by your health care provider. Insertion site care 1. Follow instructions from your health care provider about how to take care of your insertion site. Make sure you: ? Wash your hands with soap and water before you change your bandage (dressing). If soap and water are not available, use hand sanitizer. ? Remove your dressing as told by your health care provider. In 24 hours 2. Check your insertion site every day for signs of infection. Check for: ? Redness, swelling, or pain. ? Fluid or blood. ? Pus or a bad smell. ? Warmth. 3. Do not take baths, swim, or use a hot tub until your health care provider approves. 4. You may shower 24-48 hours after the procedure, or as directed by your health care provider. ? Remove the dressing and gently wash the site with plain soap and water. ? Pat the area dry with a clean towel. ? Do not rub the site. That could cause bleeding. 5. Do not apply powder or lotion to the site. Activity   1. For 24 hours after the procedure, or as directed by your health care provider: ? Do not push or pull heavy objects with the affected arm. ? Do not drive yourself home from the hospital or clinic. You may drive 24 hours after the procedure unless your health care provider tells you not to. ? Do not operate machinery or power tools. 2. Do not lift anything that is heavier than 10 lb (4.5 kg), or the limit that you are told, until your health  care provider says that it is safe.  For 2 days 3. Ask your health care provider when it is okay to: ? Return to work or school. ? Resume usual physical activities or sports. ? Resume sexual activity. General instructions  If the catheter site starts to bleed, raise your arm and put firm pressure on the site. If the bleeding does not stop, get help right away. This is a medical emergency.  If you went home on the same day as your procedure, a responsible adult should be with you for the first 24 hours after you arrive home.  Keep all follow-up visits as told by your health care provider. This is important. Contact a health care provider if:  You have a fever.  You have redness, swelling, or yellow drainage around your insertion site. Get help right away if:  You have unusual pain at the radial site.  The catheter insertion area swells very fast.  The insertion area is bleeding, and the bleeding does not stop when you hold steady pressure on the area.  Your arm or hand becomes pale, cool, tingly, or numb. These symptoms may represent a serious problem that is an emergency. Do not wait to see if the symptoms will go away. Get medical help right away. Call your local emergency services (911 in the [place] Do not drive yourself to the hospital. Summary  After the procedure, it is common to have bruising and tenderness at the  site.  Follow instructions from your health care provider about how to take care of your radial site wound. Check the wound every day for signs of infection.  Do not lift anything that is heavier than 10 lb (4.5 kg), or the limit that you are told, until your health care provider says that it is safe. This information is not intended to replace advice given to you by your health care provider. Make sure you discuss any questions you have with your health care provider. Document Revised: 01/15/2018 Document Reviewed: 01/15/2018 Elsevier Patient Education  2020  [employer].   ----------------------------------------------------------------------  1. Restart warfarin tonight.  2. Increase torsemide to 80 mg in the morning and 40 mg in the afternoon (4 tabs then 2 tabs). 3. Increase potassium to 40 mEq twice a day.  4. We will work on getting Adempas (pulmonary hypertension medication) for you.

## 2020-07-06 NOTE — Telephone Encounter (Signed)
Advanced Heart Failure Patient Advocate Encounter  Prior Authorization for Adempas has been approved.    PA# [id number] Effective dates: 07/06/20 through 12/23/20  Audry Riles, PharmD, BCPS, BCCP, CPP Heart Failure Clinic Pharmacist [phone]

## 2020-07-06 NOTE — Telephone Encounter (Signed)
Patient Advocate Encounter Received notification from [employer] that prior authorization for Adempas is required.   PA submitted on CoverMyMeds Key [id number] Status is pending   Will continue to follow.  Audry Riles, PharmD, BCPS, BCCP, CPP Heart Failure Clinic Pharmacist [phone]

## 2020-07-07 ENCOUNTER — Encounter (HOSPITAL_COMMUNITY): Payer: Self-pay | Admitting: Cardiology

## 2020-07-07 MED ORDER — ADEMPAS 1 MG PO TABS: 1.0000 mg | ORAL_TABLET | Freq: Three times a day (TID) | ORAL | 11 refills | Status: DC

## 2020-07-07 NOTE — Telephone Encounter (Signed)
Sent in Patient enrollment form and [employer] enrollment form to [facility] for Adempas.    Application pending, will continue to follow.   Audry Riles, PharmD, BCPS, BCCP, CPP Heart Failure Clinic Pharmacist [phone]

## 2020-07-09 ENCOUNTER — Other Ambulatory Visit (HOSPITAL_COMMUNITY)
Admission: RE | Admit: 2020-07-09 | Discharge: 2020-07-09 | Disposition: A | Discharge status: Home / Self Care | Payer: Medicare Other | Source: Ambulatory Visit | Attending: Cardiology | Admitting: Cardiology

## 2020-07-09 DIAGNOSIS — Z20822 Contact with and (suspected) exposure to covid-19: Secondary | ICD-10-CM | POA: Insufficient documentation

## 2020-07-09 DIAGNOSIS — Z01818 Encounter for other preprocedural examination: Secondary | ICD-10-CM | POA: Diagnosis present

## 2020-07-09 LAB — SARS CORONAVIRUS 2 (TAT 6-24 HRS): SARS Coronavirus 2: NEGATIVE

## 2020-07-10 ENCOUNTER — Other Ambulatory Visit: Payer: Self-pay | Admitting: Internal Medicine

## 2020-07-10 DIAGNOSIS — D869 Sarcoidosis, unspecified: Secondary | ICD-10-CM

## 2020-07-11 ENCOUNTER — Other Ambulatory Visit (HOSPITAL_COMMUNITY): Payer: Self-pay | Admitting: Cardiology

## 2020-07-11 ENCOUNTER — Ambulatory Visit (HOSPITAL_COMMUNITY)
Admission: RE | Admit: 2020-07-11 | Discharge: 2020-07-11 | Disposition: A | Discharge status: Home / Self Care | Payer: Medicare Other | Source: Ambulatory Visit | Attending: Cardiology | Admitting: Cardiology

## 2020-07-11 ENCOUNTER — Other Ambulatory Visit: Payer: Self-pay

## 2020-07-11 ENCOUNTER — Ambulatory Visit: Payer: Medicare Other | Admitting: Pharmacist

## 2020-07-11 ENCOUNTER — Encounter (HOSPITAL_COMMUNITY)
Admission: RE | Admit: 2020-07-11 | Discharge: 2020-07-11 | Disposition: A | Discharge status: Home / Self Care | Payer: Medicare Other | Source: Ambulatory Visit | Attending: Cardiology | Admitting: Cardiology

## 2020-07-11 DIAGNOSIS — Z87898 Personal history of other specified conditions: Secondary | ICD-10-CM | POA: Diagnosis not present

## 2020-07-11 DIAGNOSIS — Z86718 Personal history of other venous thrombosis and embolism: Secondary | ICD-10-CM

## 2020-07-11 DIAGNOSIS — Z7901 Long term (current) use of anticoagulants: Secondary | ICD-10-CM

## 2020-07-11 DIAGNOSIS — T45515A Adverse effect of anticoagulants, initial encounter: Secondary | ICD-10-CM | POA: Insufficient documentation

## 2020-07-11 DIAGNOSIS — Z86711 Personal history of pulmonary embolism: Secondary | ICD-10-CM | POA: Insufficient documentation

## 2020-07-11 DIAGNOSIS — I2699 Other pulmonary embolism without acute cor pulmonale: Secondary | ICD-10-CM

## 2020-07-11 DIAGNOSIS — I50812 Chronic right heart failure: Secondary | ICD-10-CM | POA: Diagnosis not present

## 2020-07-11 LAB — BASIC METABOLIC PANEL
Anion gap: 11 (ref 5–15)
BUN: 14 mg/dL (ref 8–23)
CO2: 37 mmol/L — ABNORMAL HIGH (ref 22–32)
Calcium: 9.1 mg/dL (ref 8.9–10.3)
Chloride: 90 mmol/L — ABNORMAL LOW (ref 98–111)
Creatinine, Ser: 1.12 mg/dL (ref 0.61–1.24)
GFR calc Af Amer: >60 mL/min (ref >60)
GFR calc non Af Amer: >60 mL/min (ref >60)
Glucose, Bld: 101 mg/dL — ABNORMAL HIGH (ref 70–99)
Potassium: 3.5 mmol/L (ref 3.5–5.1)
Sodium: 138 mmol/L (ref 135–145)

## 2020-07-11 LAB — POCT INR: INR: 2.1 (ref 2.0–3.0)

## 2020-07-11 MED ORDER — TECHNETIUM TO 99M ALBUMIN AGGREGATED
4.3000 | Freq: Once | INTRAVENOUS | Status: AC | PRN
  Administered 2020-07-11: 4.3 via INTRAVENOUS

## 2020-07-11 NOTE — Patient Instructions (Signed)
Patient instructed to take medications as defined in the Anti-coagulation Track section of this encounter.  Patient instructed to take today's dose.  Patient instructed to take one of your 27m strength blue warfarin tablets, by mouth, once-daily at 6PM--EXCEPT on MONDAYS--take ONLY 1/2 tablet on MONDAYS.  Patient verbalized understanding of these instructions.

## 2020-07-11 NOTE — Progress Notes (Signed)
Anticoagulation Management Jesus Russell is a 69 y.o. male who reports to the clinic for monitoring of warfarin treatment.    Indication: DVT and PE, History of; Long term current use of anticoagulant.   Duration: indefinite Supervising physician: Dorian Pod, MD  Anticoagulation Clinic Visit History: Patient does not report signs/symptoms of bleeding or thromboembolism   Other recent changes: No diet, medications, lifestyle except as noted in patient findings.  Anticoagulation Episode Summary    Current INR goal:  2.0-3.0  TTR:  58.1 % (8.1 mo)  Next INR check:  08/08/2020  INR from last check:    Weekly max warfarin dose:    Target end date:  Indefinite  INR check location:    Preferred lab:    Send INR reminders to:     Indications   Long term (current) use of anticoagulants [Z79.01] History of DVT (deep vein thrombosis) [Z86.718] Pulmonary embolism on long-term anticoagulation therapy (HCC) [I26.99 T45.515A] History of pulmonary embolism [Z86.711]       Comments:          No Known Allergies  Current Outpatient Medications:  .  alendronate (FOSAMAX) 70 MG tablet, Take 1 tablet (70 mg total) by mouth every 7 (seven) days. Take with a full glass of water on an empty stomach. (Patient taking differently: Take 70 mg by mouth every 7 (seven) days. Take with a full glass of water on an empty stomach. Monday), Disp: 12 tablet, Rfl: 3 .  amLODipine (NORVASC) 5 MG tablet, Take 1 tablet (5 mg total) by mouth daily., Disp: 30 tablet, Rfl: 11 .  Ascorbic Acid (VITAMIN C) 500 MG CAPS, Take 500 mg by mouth daily., Disp: , Rfl:  .  cholecalciferol (VITAMIN D3) 25 MCG (1000 UT) tablet, Take 1,000 Units by mouth daily., Disp: , Rfl:  .  losartan-hydrochlorothiazide (HYZAAR) 50-12.5 MG tablet, Take 1 tablet by mouth daily., Disp: , Rfl:  .  omega-3 acid ethyl esters (LOVAZA) 1 g capsule, Take 1 g by mouth 2 (two) times daily., Disp: , Rfl:  .  potassium chloride SA (KLOR-CON) 20 MEQ  tablet, Take 2 tablets (40 mEq total) by mouth 2 (two) times daily., Disp: 60 tablet, Rfl: 5 .  Riociguat (ADEMPAS) 1 MG TABS, Take 1 mg by mouth 3 (three) times daily., Disp: 90 tablet, Rfl: 11 .  torsemide (DEMADEX) 20 MG tablet, Take 4 tablets (80 mg total) by mouth in the morning AND 2 tablets (40 mg total) every evening., Disp: 360 tablet, Rfl: 3 .  warfarin (COUMADIN) 4 MG tablet, Take one-and-one-half (1 & 1/2)  tablets on Mondays and Thursdays. All other day, take one (1) tablet (Patient taking differently: Take 2-4 mg by mouth See admin instructions. MONDAY 2 mg.  All the other days take 4 mg), Disp: 96 tablet, Rfl: 1 .  zinc gluconate 50 MG tablet, Take 50 mg by mouth daily., Disp: , Rfl:  Past Medical History:  Diagnosis Date  . Acute on chronic respiratory failure with hypoxia ([place])   . COVID-19 virus infection   . DVT of lower extremity, bilateral ([place])   . Epistaxis   . Hypertension   . Incarcerated umbilical hernia 14/4315   Status post repair  . Lupus anticoagulant positive    Per records from [facility]  . 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 Pulmonary artery hypertension ([place]) 04/2011   Mildly elevated pulmonary artery pressure in  setting of PE (from [place])  . Pulmonary embolism associated with COVID-19 ([place])   . Subdural hematoma (HCC)    Remote episode, occurred when taking excedrin and warfarin.    Social History   Socioeconomic History  . Marital status: Divorced    Spouse name: Not on file  . Number of children: Not on file  . Years of education: Not on file  . Highest education level: Not on file  Occupational History  . Occupation: retired Pharmacist, hospital  Tobacco Use  . Smoking status: Former Smoker    Packs/day: 1.50    Years: 23.00    Pack years: 34.50    Types: Cigarettes    Quit date: 1995    Years since quitting: 26.5  . Smokeless tobacco: Never Used  Vaping Use  . Vaping Use:  Never used  Substance and Sexual Activity  . Alcohol use: Never  . Drug use: Not Currently    Types: "Crack" cocaine    Comment: quit 2008  . Sexual activity: Not on file  Other Topics Concern  . Not on file  Social History Narrative  . Not on file   Social Determinants of Health   Financial Resource Strain: Low Risk   . Difficulty of Paying Living Expenses: Not very hard  Food Insecurity:   . Worried About Charity fundraiser in the Last Year:   . Arboriculturist in the Last Year:   Transportation Needs:   . Film/video editor (Medical):   Marland Kitchen Lack of Transportation (Non-Medical):   Physical Activity:   . Days of Exercise per Week:   . Minutes of Exercise per Session:   Stress:   . Feeling of Stress :   Social Connections:   . Frequency of Communication with Friends and Family:   . Frequency of Social Gatherings with Friends and Family:   . Attends Religious Services:   . Active Member of Clubs or Organizations:   . Attends Archivist Meetings:   Marland Kitchen Marital Status:    Family History  Adopted: Yes    ASSESSMENT Recent Results: The most recent result is correlated with 20 mg per week: Lab Results  Component Value Date   INR 2.1 07/11/2020   INR 1.8 (H) 07/06/2020   INR 3.5 (A) 06/20/2020    Anticoagulation Dosing: Description   Take one of your 85m strength blue warfarin tablets, by mouth, once-daily at 6PM--EXCEPT on MONDAYS--take ONLY 1/2 tablet on MONDAYS.      INR today: Therapeutic  PLAN Weekly dose was increased by 27% to 26 mg per week  Patient Instructions  Patient instructed to take medications as defined in the Anti-coagulation Track section of this encounter.  Patient instructed to take today's dose.  Patient instructed to take one of your 434mstrength blue warfarin tablets, by mouth, once-daily at 6PM--EXCEPT on MONDAYS--take ONLY 1/2 tablet on MONDAYS.  Patient verbalized understanding of these instructions.    Patient advised  to contact clinic or seek medical attention if signs/symptoms of bleeding or thromboembolism occur.  Patient verbalized understanding by repeating back information and was advised to contact me if further medication-related questions arise. Patient was also provided an information handout.  Follow-up Return in 4 weeks (on 08/08/2020) for Follow up INR.  JaPennie BanterPharmD, CPP  15 minutes spent face-to-face with the patient during the encounter. 50% of time spent on education, including signs/sx bleeding and clotting, as well as food and drug interactions with warfarin.  50% of time was spent on fingerprick POC INR sample collection,processing, results determination, and documentation in [email or web address].[email or web address]/.

## 2020-07-12 ENCOUNTER — Other Ambulatory Visit: Payer: Self-pay | Admitting: Primary Care

## 2020-07-12 ENCOUNTER — Ambulatory Visit (INDEPENDENT_AMBULATORY_CARE_PROVIDER_SITE_OTHER): Payer: Medicare Other | Admitting: Primary Care

## 2020-07-12 ENCOUNTER — Encounter: Payer: Self-pay | Admitting: Primary Care

## 2020-07-12 ENCOUNTER — Other Ambulatory Visit: Payer: Self-pay

## 2020-07-12 VITALS — BP 120/80 | HR 95 | Temp 97.3°F | Ht 69.0 in | Wt 216.6 lb

## 2020-07-12 DIAGNOSIS — G4733 Obstructive sleep apnea (adult) (pediatric): Secondary | ICD-10-CM

## 2020-07-12 DIAGNOSIS — I50813 Acute on chronic right heart failure: Secondary | ICD-10-CM | POA: Diagnosis not present

## 2020-07-12 DIAGNOSIS — J449 Chronic obstructive pulmonary disease, unspecified: Secondary | ICD-10-CM

## 2020-07-12 MED ORDER — BREZTRI AEROSPHERE 160-9-4.8 MCG/ACT IN AERO: 2.0000 | INHALATION_SPRAY | Freq: Two times a day (BID) | RESPIRATORY_TRACT | 0 refills | Status: DC

## 2020-07-12 NOTE — Progress Notes (Signed)
_0  ID: Jesus Russell, male    DOB: [birth year], 69 y.o.   MRN: [id number]  Chief Complaint  Patient presents with  . Follow-up    more SOB and swelling.    Referring provider: Madalyn Rob, MD  HPI: 69 year old male, former smoker quit 1990 (34-pack-year history). Past medical history significant for sarcoidosis dx 1990 by bronchoscopy/biopsy (weaned off prednisone), pulmonary embolism (on anticoagulation), hypertension, right heart failure, pulmonary hypertension, obstructive sleep apnea, COVID-19 in January 2021.  Patient of Dr. Shearon Stalls, last seen by pulmonary nurse practitioner for virtual telephone visit on 06/20/2020.  Patient felt to have continued fluid overload despite Zaroxolyn every other day.  Continues Lasix 40 mg daily.  He was ordered for labs.  Following with cardiology, scheduled visit on 06/29/2020.  07/12/2020 Patient presents today for a 2-week follow-up visit for dyspnea felt to be related to heart failure and deconditioning. Patient reported 6-7 week increase in exertion dyspnea. He was referred to cardiology and seen on July 7th. He was started on Torsemide 32m in the morning and 477min the evening. He underwent right heart cath on 07/06/20 with Dr. McAundra DubinPatient started on Adempas, awaiting prior authoriation. Reports that his weight is coming down. He is still short of breath, gets winded when standing. He remains on 2-3L oxygen. He never completed sleep study because he can not sleep on his back to compression fractures. Not currently CPAP. Spirometry showed moderate restrictive lung disease with positive bronchodilator response. States that he has difficulty using BREO. His next follow-up with cardiology is August 3rd. He quit smoking tobacco in 1990 and crack cocaine/marijuana 2008. He likely has underlying COPD. Will trial Breztri.    Echo in 5/21 showed EF 55-60% with D-shaped septum and severely dilated/severely dysfunctional RV.  No estimation of PA pressure was  available.    No Known Allergies  Immunization History  Administered Date(s) Administered  . Influenza,inj,Quad PF,6+ Mos 09/09/2019  . PFIZER SARS-COV-2 Vaccination 04/09/2020, 05/02/2020    Past Medical History:  Diagnosis Date  . Acute on chronic respiratory failure with hypoxia ([facility]  . COVID-19 virus infection   . D[place] of [place] extremity, bilateral ([facility] [place]  . Epistaxis   . Hypertension   . Incarcerated umbilical hernia 0520/2542 Status post repair  . Lupus anticoagulant positive    Per records from [facility]  . 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 Pulmonary artery hypertension ([facility]/2012   Mildly elevated pulmonary artery pressure in setting of PE (from [facility])  . Pulmonary embolism associated with COVID-19 ([facility]  . Subdural hematoma (HCC)    Remote episode, occurred when taking excedrin and warfarin.     Tobacco History: Social History   Tobacco Use  Smoking Status Former Smoker  . Packs/day: 1.50  . Years: 23.00  . Pack years: 34.50  . Types: Cigarettes  . Quit date: 1975. Years since quitting: 26.5  Smokeless Tobacco Never Used   Counseling given: Not Answered   Outpatient Medications Prior to Visit  Medication Sig Dispense Refill  . alendronate (FOSAMAX) 70 MG tablet Take 1 tablet (70 mg total) by mouth every 7 (seven) days. Take with a full glass of water on an empty stomach. (Patient taking differently: Take 70 mg by mouth every 7 (seven) days. Take with a full glass of water on an empty stomach. Monday) 12 tablet 3  .  amLODipine (NORVASC) 5 MG tablet Take 1 tablet (5 mg total) by mouth daily. 30 tablet 11  . Ascorbic Acid (VITAMIN C) 500 MG CAPS Take 500 mg by mouth daily.    . cholecalciferol (VITAMIN D3) 25 MCG (1000 UT) tablet Take 1,000 Units by mouth daily.    Marland Kitchen losartan-hydrochlorothiazide (HYZAAR) 50-12.5 MG tablet Take 1 tablet by mouth daily.    Marland Kitchen omega-3 acid  ethyl esters (LOVAZA) 1 g capsule Take 1 g by mouth 2 (two) times daily.    . potassium chloride SA (KLOR-CON) 20 MEQ tablet Take 2 tablets (40 mEq total) by mouth 2 (two) times daily. 60 tablet 5  . torsemide (DEMADEX) 20 MG tablet Take 4 tablets (80 mg total) by mouth in the morning AND 2 tablets (40 mg total) every evening. 360 tablet 3  . warfarin (COUMADIN) 4 MG tablet Take one-and-one-half (1 & 1/2)  tablets on Mondays and Thursdays. All other day, take one (1) tablet (Patient taking differently: Take 2-4 mg by mouth See admin instructions. MONDAY 2 mg.  All the other days take 4 mg) 96 tablet 1  . zinc gluconate 50 MG tablet Take 50 mg by mouth daily.    . Riociguat (ADEMPAS) 1 MG TABS Take 1 mg by mouth 3 (three) times daily. (Patient not taking: Reported on 07/12/2020) 90 tablet 11  . BREO ELLIPTA 200-25 MCG/INH AEPB INHALE 1 PUFF INTO THE LUNGS DAILY (Patient not taking: Reported on 07/12/2020) 60 each 2   No facility-administered medications prior to visit.   Review of Systems  Review of Systems  Respiratory: Positive for wheezing.    Physical Exam  BP 120/80 (BP Location: Left Arm, Cuff Size: Normal)   Pulse 95   Temp (!) 97.3 F (36.3 C) (Oral)   Ht 5' 9" (1.753 m)   Wt 216 lb 9.6 oz (98.2 kg)   SpO2 95%   BMI 31.99 kg/m  Physical Exam Constitutional:      Appearance: Normal appearance.  HENT:     Head: Normocephalic and atraumatic.  Cardiovascular:     Comments: +3 BLE edema  Pulmonary:     Breath sounds: Wheezing present.  Neurological:     Mental Status: He is alert.  Psychiatric:        Mood and Affect: Mood normal.        Behavior: Behavior normal.        Thought Content: Thought content normal.        Judgment: Judgment normal.       Lab Results:  CBC    Component Value Date/Time   WBC 10.6 (H) 06/29/2020 1127   RBC 3.83 (L) 06/29/2020 1127   HGB 11.6 (L) 07/06/2020 1526   HGB 11.9 (L) 07/06/2020 1526   HGB 11.7 (L) 03/31/2020 1412   HCT 34.0  (L) 07/06/2020 1526   HCT 35.0 (L) 07/06/2020 1526   HCT 36.3 (L) 03/31/2020 1412   PLT 457 (H) 06/29/2020 1127   PLT 331 03/31/2020 1412   MCV 93.0 06/29/2020 1127   MCV 100 (H) 03/31/2020 1412   MCH 26.9 06/29/2020 1127   MCHC 28.9 (L) 06/29/2020 1127   RDW 17.8 (H) 06/29/2020 1127   RDW 14.3 03/31/2020 1412   LYMPHSABS 1.2 05/20/2020 1229   LYMPHSABS 2.3 03/31/2020 1412   MONOABS 1.2 (H) 05/20/2020 1229   EOSABS 0.0 05/20/2020 1229   EOSABS 0.2 03/31/2020 1412   BASOSABS 0.0 05/20/2020 1229   BASOSABS 0.1 03/31/2020 1412  BMET    Component Value Date/Time   NA 138 07/11/2020 1121   NA 141 03/31/2020 1412   K 3.5 07/11/2020 1121   CL 90 (L) 07/11/2020 1121   CO2 37 (H) 07/11/2020 1121   GLUCOSE 101 (H) 07/11/2020 1121   BUN 14 07/11/2020 1121   BUN 17 03/31/2020 1412   CREATININE 1.12 07/11/2020 1121   CALCIUM 9.1 07/11/2020 1121   GFRNONAA >60 07/11/2020 1121   GFRAA >60 07/11/2020 1121    BNP    Component Value Date/Time   BNP 82.9 05/20/2020 1228    ProBNP    Component Value Date/Time   PROBNP 43.0 06/13/2020 0944    Imaging: DG Chest 2 View  Result Date: 07/12/2020 CLINICAL DATA:  69 year old male with history of shortness of breath and chest tightness for the past 3 weeks. EXAM: CHEST - 2 VIEW COMPARISON:  Chest x-ray 06/13/2020. FINDINGS: Widespread coarse reticulonodular opacities are again noted throughout the lungs bilaterally, very similar to prior examinations, with associated areas of architectural distortion. No definite acute consolidative airspace disease. No pleural effusions. Heart size is normal. Upper mediastinal contours are within normal limits. Multiple chronic compression fractures and post vertebroplasty changes noted in the midthoracic spine. IMPRESSION: 1. The appearance the chest is similar to prior studies with chronic changes related to known history of sarcoidosis, as above. No definite acute findings. Electronically Signed   By:  Vinnie Langton M.D.   On: 07/12/2020 10:52   DG Chest 2 View  Result Date: 06/13/2020 CLINICAL DATA:  Dyspnea.  Sarcoidosis. EXAM: CHEST - 2 VIEW COMPARISON:  05/20/2020.  CT chest 02/14/2020 FINDINGS: Patchy diffuse bilateral airspace disease unchanged compatible with scarring which could be seen with sarcoidosis. No superimposed acute infiltrate or effusion. Negative for heart failure. IMPRESSION: Chronic lung disease and scarring stable. Electronically Signed   By: Franchot Gallo M.D.   On: 06/13/2020 15:45   MR THORACIC SPINE W WO CONTRAST  Result Date: 06/23/2020 CLINICAL DATA:  History of kyphoplasty with severe pain. EXAM: MRI THORACIC WITHOUT AND WITH CONTRAST TECHNIQUE: Multiplanar and multiecho pulse sequences of the thoracic spine were obtained without and with intravenous contrast. CONTRAST:  43m GADAVIST GADOBUTROL 1 MMOL/ML IV SOLN COMPARISON:  04/19/2020 FINDINGS: MRI THORACIC SPINE FINDINGS Alignment:  Exaggerated thoracic kyphosis. Vertebrae: T8, T9, and T10 compression fractures with cement augmentation since prior. Marrow edema and greatest degree of marrow enhancement is seen at the T9 level. Interval T7 compression fracture with approximately 60% height loss when compared to T5, marrow edema and small fluid-filled fracture cleft inferior and left para median. No retropulsion. No suspected bone lesion. Remote superior endplate fracture at T6. Cord:  Normal signal and morphology when allowing for artifact. Paraspinal and other soft tissues: Paravertebral edema at the level of fractures. No discrete hematoma. Disc levels: Ordinary degenerative changes.  No degenerative cord impingement Motion degraded IMPRESSION: 1. Acute or subacute T7 compression fracture with approximately 60% height loss and no retropulsion. 2. T8-T10 compression fracture with interval cement augmentation. Mild marrow edema is still seen at the T9 level. 3. Exaggerated thoracic kyphosis. Electronically Signed   By:  JMonte FantasiaM.D.   On: 06/23/2020 08:03   NM Pulmonary Perfusion  Result Date: 07/12/2020 CLINICAL DATA:  History of pulmonary emboli with chest pain and shortness of breath EXAM: NUCLEAR MEDICINE PERFUSION LUNG SCAN TECHNIQUE: Perfusion images were obtained in multiple projections after intravenous injection of radiopharmaceutical. Ventilation scans intentionally deferred if perfusion scan and chest x-ray adequate  for interpretation during COVID 19 epidemic. RADIOPHARMACEUTICALS:  4.3 mCi Tc-51mMAA IV COMPARISON:  Chest CT from 02/14/2020 and plain film examination from earlier in the same day. FINDINGS: Uptake is noted in both lungs although there are multiple bilateral perfusion defects identified. Some of these correspond to areas of significant scarring seen on recent chest x-ray and prior CT examination. In light of the clinical history however pulmonary embolus cannot be totally excluded. IMPRESSION: Multiple perfusion defects bilaterally likely related to the patient's underlying sarcoid and scarring although pulmonary embolus cannot be excluded. CTA of the chest is recommended if there is strong clinical history for pulmonary embolism. Additionally any future workup should include CTA of the chest as opposed to perfusion scanning given the underlying scarring. Electronically Signed   By: MInez CatalinaM.D.   On: 07/12/2020 14:02   CARDIAC CATHETERIZATION  Result Date: 07/06/2020 1. Moderately pulmonary arterial hypertension. 2. Mildly elevated RV filling pressure. Increase torsemide to 80 qam/40 qpm, increase KCl to 40 bid. 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     Assessment & Plan:   COPD (chronic obstructive pulmonary disease) ([place] - Pulmonary function testing in 2019 showed moderate restrictive lung disease with positive bronchodilator.  Based on clinical symptoms and borderline FEV1/FVC ratio of 70 would  recommend that we try you on triple therapy for COPD/asthma overlap.   - We will give patient a sample of Breztri, take 2 puffs twice daily (rinse mouth after use)  - He unlikely would be able to repeat PFTs at this time, will hold off until further notice   Obstructive sleep apnea - Not currently on CPAP therapy, unable to do in-lab sleep study d/t back pain  - Needs home sleep study to confirm diagnosis of sleep apnea and get patient started on CPAP  Right heart failure ([facility] - Reports weight loss, continues to have shortness of breath and lower extremity edema - Continue torsemide 80 mg once daily in the morning and 40 mg in the evening - Patient to contact cardiology regarding Adempas prescription, they sent in a prior Authorization on July 14 and you should be hearing back regarding this anytime now    EMartyn Ehrich NP 07/13/2020

## 2020-07-12 NOTE — Patient Instructions (Addendum)
Pleasure seeing you again today Jesus Russell  Pulmonary function testing in 2019 showed moderate restrictive lung disease with positive bronchodilator.  Based on clinical symptoms and borderline FEV1/FVC ratio of 70 would recommend that we try you on triple therapy for COPD/asthma overlap.    We will give you a sample of a medication called Breztri, take 2 puffs twice daily (rinse mouth after use)  We do need to check a home sleep study to confirm diagnosis of sleep apnea and get you started on CPAP  Please follow-up with cardiology regarding Adempas prescription, they sent in a prior Auth on July 14 and you should be hearing back regarding this anytime now  Continue torsemide 80 mg once daily in the morning and 40 mg in the evening ____________________________________________________________________________  Orders: Please cancel split night sleep study Needs home sleep study  Rx: Sample Breztri two puffs twice daily  Follow-up: 2 to 4 weeks telephone visit with Eustaquio Maize NP

## 2020-07-12 NOTE — Progress Notes (Signed)
Agree with Dr. Gladstone Pih plan as documented.  Query reason not candidate for DOAC.

## 2020-07-13 ENCOUNTER — Encounter: Payer: Self-pay | Admitting: Primary Care

## 2020-07-13 DIAGNOSIS — J449 Chronic obstructive pulmonary disease, unspecified: Secondary | ICD-10-CM | POA: Insufficient documentation

## 2020-07-13 NOTE — Assessment & Plan Note (Signed)
-  Reports weight loss, continues to have shortness of breath and lower extremity edema - Continue torsemide 80 mg once daily in the morning and 40 mg in the evening - Patient to contact cardiology regarding Adempas prescription, they sent in a prior Authorization on July 14 and you should be hearing back regarding this anytime now

## 2020-07-13 NOTE — Assessment & Plan Note (Addendum)
-  Pulmonary function testing in 2019 showed moderate restrictive lung disease with positive bronchodilator.  Based on clinical symptoms and borderline FEV1/FVC ratio of 70 would recommend that we try you on triple therapy for COPD/asthma overlap.   - We will give patient a sample of Breztri, take 2 puffs twice daily (rinse mouth after use)  - He unlikely would be able to repeat PFTs at this time, will hold off until further notice

## 2020-07-13 NOTE — Assessment & Plan Note (Addendum)
-  Not currently on CPAP therapy, unable to do in-lab sleep study d/t back pain  - Needs home sleep study to confirm diagnosis of sleep apnea and get patient started on CPAP

## 2020-07-14 ENCOUNTER — Encounter (HOSPITAL_COMMUNITY): Payer: Self-pay

## 2020-07-14 ENCOUNTER — Other Ambulatory Visit: Payer: Self-pay

## 2020-07-14 ENCOUNTER — Emergency Department (HOSPITAL_COMMUNITY): Payer: Medicare Other

## 2020-07-14 ENCOUNTER — Telehealth (HOSPITAL_COMMUNITY): Payer: Self-pay

## 2020-07-14 ENCOUNTER — Observation Stay (HOSPITAL_COMMUNITY)
Admission: EM | Admit: 2020-07-14 | Discharge: 2020-07-15 | Disposition: A | Discharge status: Home / Self Care | Payer: Medicare Other | Attending: Internal Medicine | Admitting: Internal Medicine

## 2020-07-14 DIAGNOSIS — J9610 Chronic respiratory failure, unspecified whether with hypoxia or hypercapnia: Secondary | ICD-10-CM

## 2020-07-14 DIAGNOSIS — M4854XA Collapsed vertebra, not elsewhere classified, thoracic region, initial encounter for fracture: Secondary | ICD-10-CM | POA: Diagnosis not present

## 2020-07-14 DIAGNOSIS — G4733 Obstructive sleep apnea (adult) (pediatric): Secondary | ICD-10-CM | POA: Diagnosis not present

## 2020-07-14 DIAGNOSIS — Z87891 Personal history of nicotine dependence: Secondary | ICD-10-CM | POA: Diagnosis not present

## 2020-07-14 DIAGNOSIS — M810 Age-related osteoporosis without current pathological fracture: Secondary | ICD-10-CM | POA: Diagnosis present

## 2020-07-14 DIAGNOSIS — D6862 Lupus anticoagulant syndrome: Secondary | ICD-10-CM | POA: Diagnosis not present

## 2020-07-14 DIAGNOSIS — Z86711 Personal history of pulmonary embolism: Secondary | ICD-10-CM | POA: Insufficient documentation

## 2020-07-14 DIAGNOSIS — I2699 Other pulmonary embolism without acute cor pulmonale: Secondary | ICD-10-CM

## 2020-07-14 DIAGNOSIS — Z7901 Long term (current) use of anticoagulants: Secondary | ICD-10-CM | POA: Diagnosis present

## 2020-07-14 DIAGNOSIS — R0789 Other chest pain: Secondary | ICD-10-CM | POA: Diagnosis present

## 2020-07-14 DIAGNOSIS — S22000A Wedge compression fracture of unspecified thoracic vertebra, initial encounter for closed fracture: Secondary | ICD-10-CM | POA: Diagnosis present

## 2020-07-14 DIAGNOSIS — I50810 Right heart failure, unspecified: Secondary | ICD-10-CM | POA: Diagnosis present

## 2020-07-14 DIAGNOSIS — J449 Chronic obstructive pulmonary disease, unspecified: Secondary | ICD-10-CM | POA: Diagnosis present

## 2020-07-14 DIAGNOSIS — Z86718 Personal history of other venous thrombosis and embolism: Secondary | ICD-10-CM

## 2020-07-14 DIAGNOSIS — R52 Pain, unspecified: Secondary | ICD-10-CM

## 2020-07-14 DIAGNOSIS — M549 Dorsalgia, unspecified: Secondary | ICD-10-CM | POA: Diagnosis present

## 2020-07-14 DIAGNOSIS — Z20822 Contact with and (suspected) exposure to covid-19: Secondary | ICD-10-CM | POA: Diagnosis not present

## 2020-07-14 DIAGNOSIS — D869 Sarcoidosis, unspecified: Secondary | ICD-10-CM | POA: Diagnosis not present

## 2020-07-14 DIAGNOSIS — Z79899 Other long term (current) drug therapy: Secondary | ICD-10-CM | POA: Diagnosis not present

## 2020-07-14 DIAGNOSIS — R791 Abnormal coagulation profile: Secondary | ICD-10-CM

## 2020-07-14 DIAGNOSIS — I82891 Chronic embolism and thrombosis of other specified veins: Secondary | ICD-10-CM | POA: Diagnosis not present

## 2020-07-14 DIAGNOSIS — S22060A Wedge compression fracture of T7-T8 vertebra, initial encounter for closed fracture: Secondary | ICD-10-CM

## 2020-07-14 LAB — BASIC METABOLIC PANEL
Anion gap: 11 (ref 5–15)
BUN: 19 mg/dL (ref 8–23)
CO2: 37 mmol/L — ABNORMAL HIGH (ref 22–32)
Calcium: 9 mg/dL (ref 8.9–10.3)
Chloride: 90 mmol/L — ABNORMAL LOW (ref 98–111)
Creatinine, Ser: 1.32 mg/dL — ABNORMAL HIGH (ref 0.61–1.24)
GFR calc Af Amer: >60 mL/min (ref >60)
GFR calc non Af Amer: 56 mL/min — ABNORMAL LOW (ref >60)
Glucose, Bld: 101 mg/dL — ABNORMAL HIGH (ref 70–99)
Potassium: 3.7 mmol/L (ref 3.5–5.1)
Sodium: 138 mmol/L (ref 135–145)

## 2020-07-14 LAB — CBC
HCT: 36.2 % — ABNORMAL LOW (ref 39.0–52.0)
Hemoglobin: 10.4 g/dL — ABNORMAL LOW (ref 13.0–17.0)
MCH: 26.6 pg (ref 26.0–34.0)
MCHC: 28.7 g/dL — ABNORMAL LOW (ref 30.0–36.0)
MCV: 92.6 fL (ref 80.0–100.0)
Platelets: 399 10*3/uL (ref 150–400)
RBC: 3.91 MIL/uL — ABNORMAL LOW (ref 4.22–5.81)
RDW: 19.3 % — ABNORMAL HIGH (ref 11.5–15.5)
WBC: 11.4 10*3/uL — ABNORMAL HIGH (ref 4.0–10.5)
nRBC: 0 % (ref 0.0–0.2)

## 2020-07-14 LAB — TROPONIN I (HIGH SENSITIVITY): Troponin I (High Sensitivity): 10 ng/L (ref <18)

## 2020-07-14 MED ORDER — SODIUM CHLORIDE 0.9% FLUSH: 3.0000 mL | Freq: Once | INTRAVENOUS | Status: DC

## 2020-07-14 NOTE — ED Triage Notes (Signed)
Pt arrives to ED via [facility] w/ c/o L sided chest pain that started 2 weeks ago and has gotten progressively worse. Pt reports 7/10 pain. Pt has hx blood clots and states this feels morning. Pt had recent procedure and had to stop taking his warfarin. Pt wears 2 lpm o2 via [place] at baseline per EMS. EMS EKG unremarkable. EMS VSS  118/74 HR 86 97% on 4 lpm o2 via [place].

## 2020-07-14 NOTE — Telephone Encounter (Signed)
Patient advised and verbalized understanding and is agreeable to have chest CTA done.CTA ordered Arbutus Leas, can you schedule this patient for a Chest CTA preferably on 8/3 when he comes in to see Dr. Aundra Dubin due to patient mobility no pre cert req  Orders Placed This Encounter  Procedures  . CT ANGIO CHEST PE W OR WO CONTRAST    Standing Status:   Future    Standing Expiration Date:   07/14/2021    Order Specific Question:   If indicated for the ordered procedure, I authorize the administration of contrast media per Radiology protocol    Answer:   Yes    Order Specific Question:   Preferred imaging location?    Answer:   [facility]    Order Specific Question:   Radiology Contrast Protocol - do NOT remove file path    Answer:   [email or web address]

## 2020-07-14 NOTE — Telephone Encounter (Signed)
-----  Message from [name], MD sent at 07/13/2020  9:55 PM EDT ----- Perfusion defects, cannot tell for sure sarcoid vs pulmonary embolus.  Needs followup CTA chest to look for chronic PE.

## 2020-07-15 ENCOUNTER — Emergency Department (HOSPITAL_COMMUNITY): Payer: Medicare Other

## 2020-07-15 ENCOUNTER — Other Ambulatory Visit: Payer: Self-pay

## 2020-07-15 DIAGNOSIS — M4854XA Collapsed vertebra, not elsewhere classified, thoracic region, initial encounter for fracture: Secondary | ICD-10-CM | POA: Diagnosis not present

## 2020-07-15 DIAGNOSIS — S22000A Wedge compression fracture of unspecified thoracic vertebra, initial encounter for closed fracture: Secondary | ICD-10-CM | POA: Diagnosis present

## 2020-07-15 LAB — TROPONIN I (HIGH SENSITIVITY): Troponin I (High Sensitivity): 10 ng/L (ref <18)

## 2020-07-15 LAB — SODIUM, URINE, RANDOM: Sodium, Ur: <10 mmol/L

## 2020-07-15 LAB — PROTIME-INR
INR: 1.4 — ABNORMAL HIGH (ref 0.8–1.2)
Prothrombin Time: 16.3 seconds — ABNORMAL HIGH (ref 11.4–15.2)

## 2020-07-15 LAB — CREATININE, URINE, RANDOM: Creatinine, Urine: 132.73 mg/dL

## 2020-07-15 LAB — SARS CORONAVIRUS 2 BY RT PCR (HOSPITAL ORDER, PERFORMED IN ~~LOC~~ HOSPITAL LAB): SARS Coronavirus 2: NEGATIVE

## 2020-07-15 MED ORDER — IPRATROPIUM-ALBUTEROL 0.5-2.5 (3) MG/3ML IN SOLN
3.0000 mL | Freq: Once | RESPIRATORY_TRACT | Status: AC
  Administered 2020-07-15: 3 mL via RESPIRATORY_TRACT
  Filled 2020-07-15: qty 3

## 2020-07-15 MED ORDER — ENOXAPARIN SODIUM 100 MG/ML ~~LOC~~ SOLN
1.0000 mg/kg | Freq: Two times a day (BID) | SUBCUTANEOUS | Status: DC
  Administered 2020-07-15: 97.5 mg via SUBCUTANEOUS
  Filled 2020-07-15 (×2): qty 0.97

## 2020-07-15 MED ORDER — ONDANSETRON HCL 4 MG/2ML IJ SOLN
4.0000 mg | Freq: Once | INTRAMUSCULAR | Status: AC
  Administered 2020-07-15: 4 mg via INTRAVENOUS
  Filled 2020-07-15: qty 2

## 2020-07-15 MED ORDER — WARFARIN - PHARMACIST DOSING INPATIENT: Freq: Every day | Status: DC

## 2020-07-15 MED ORDER — OXYCODONE-ACETAMINOPHEN 5-325 MG PO TABS: 1.0000 | ORAL_TABLET | Freq: Four times a day (QID) | ORAL | 0 refills | Status: AC | PRN

## 2020-07-15 MED ORDER — IOHEXOL 350 MG/ML SOLN
75.0000 mL | Freq: Once | INTRAVENOUS | Status: AC | PRN
  Administered 2020-07-15: 75 mL via INTRAVENOUS

## 2020-07-15 MED ORDER — DICLOFENAC SODIUM 1 % EX GEL
2.0000 g | Freq: Once | CUTANEOUS | Status: AC
  Administered 2020-07-15: 16:00:00 2 g via TOPICAL
  Filled 2020-07-15: qty 100

## 2020-07-15 MED ORDER — FENTANYL CITRATE (PF) 100 MCG/2ML IJ SOLN
25.0000 ug | Freq: Once | INTRAMUSCULAR | Status: AC
  Administered 2020-07-15: 25 ug via INTRAVENOUS
  Filled 2020-07-15: qty 2

## 2020-07-15 MED ORDER — WARFARIN SODIUM 5 MG PO TABS
5.0000 mg | ORAL_TABLET | Freq: Once | ORAL | Status: AC
  Administered 2020-07-15: 5 mg via ORAL
  Filled 2020-07-15: qty 1

## 2020-07-15 MED ORDER — FENTANYL CITRATE (PF) 100 MCG/2ML IJ SOLN
12.5000 ug | Freq: Once | INTRAMUSCULAR | Status: AC
  Administered 2020-07-15: 12.5 ug via INTRAVENOUS
  Filled 2020-07-15: qty 2

## 2020-07-15 NOTE — ED Notes (Signed)
Pt lunch tray at bedside

## 2020-07-15 NOTE — Progress Notes (Signed)
ANTICOAGULATION CONSULT NOTE - Initial Consult  Pharmacy Consult for lovenox + warfarin Indication: pulmonary embolus  No Known Allergies  Patient Measurements:    Vital Signs: BP: 118/86 (07/23 0730) Pulse Rate: 83 (07/23 0737)  Labs: Recent Labs    07/14/20 2150 07/15/20 0011 07/15/20 0756  HGB 10.4*  --   --   HCT 36.2*  --   --   PLT 399  --   --   LABPROT  --   --  16.3*  INR  --   --  1.4*  CREATININE 1.32*  --   --   TROPONINIHS 10 10  --     Estimated Creatinine Clearance: 63.6 mL/min (A) (by C-G formula based on SCr of 1.32 mg/dL (H)).   Medical History: Past Medical History:  Diagnosis Date  . Acute on chronic respiratory failure with hypoxia ([place])   . COVID-19 virus infection   . DVT of lower extremity, bilateral ([place])   . Epistaxis   . Hypertension   . Incarcerated umbilical hernia 70/1779   Status post repair  . Lupus anticoagulant positive    Per records from [facility]  . 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 Pulmonary artery hypertension ([place]) 04/2011   Mildly elevated pulmonary artery pressure in setting of PE (from [facility])  . Pulmonary embolism associated with COVID-19 ([place])   . Subdural hematoma ([facility])    Remote episode, occurred when taking excedrin and warfarin.    Assessment: Jesus Russell presenting with CP and SOB.  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  INR 1.4 (was 2.1 on 7/19), chronic anemia stable, plts 399.  For lovenox bridge  PTA dosing: 39m daily, except Mon 2832m Goal of Therapy:  Anti-Xa level 0.6-1 units/ml 4hrs after LMWH dose given Monitor platelets by anticoagulation protocol: Yes   Plan:  Warfarin 32m82mO x 1 today Lovenox 1mg53m (97.32mg)77m every 12 hours until INR therapeutic Monitor renal function, daily INR, s/s bleeding  JonatBertis RuddyrmD Clinical Pharmacist ED Pharmacist Phone # [phone] 1:36 PM

## 2020-07-15 NOTE — ED Provider Notes (Signed)
MSE was initiated and I personally evaluated the patient and placed orders (if any) at  4:46 AM on July 15, 2020.  The patient appears stable so that the remainder of the MSE may be completed by another provider.  68 year old male with a history of PE on Coumadin who had to stop the medication for 2 days for a recent procedure, sarcoidosis, HTN, COPD, congestive heart failure who presents the emergency department with worsening shortness of breath and chest pain.  The patient reports that he has been having left-sided chest pain for the last 2 weeks, but reports that became much worse earlier today.  The pain is around his left breast.  He wears 2 L of oxygen at home, but is currently on 3 L.  He uses home albuterol inhaler earlier today.  Chronically ill-appearing.  He has conversational dyspnea (every 4-5 words), but no retractions, accessory muscle use, or acute distress.  Lung sounds are diminished throughout with inspiratory and expiratory wheezes.  Will order DuoNeb while patient is waiting to be roomed at this time.    Joline Maxcy A, PA-C 07/15/20 0446    Orpah Greek, MD 07/15/20 [phone]

## 2020-07-15 NOTE — Discharge Summary (Addendum)
Name: Jesus Russell MRN: [id number] DOB: 12-Nov-1951 69 y.o. PCP: Jesus Rob, MD  Date of Admission: 07/14/2020  9:24 PM Date of Discharge: 07/15/2020 Attending Physician: Jesus Falcon, MD  Discharge Diagnosis: 1. Subacute compression fracture of T7 2.  Sarcoidosis 3.  Chronic respiratory failure 2L on nasal cannula 4.  Recurrent DVT/pulmonary embolism, positive lupus anticoagulant.  Status post IVC filter 5.  Right-sided heart failure/cor pulmonale/pulmonary pretension 6.  COPD 7.  OSA   Discharge Medications: Allergies as of 07/15/2020   No Known Allergies     Medication List    TAKE these medications   Adempas 1 MG Tabs Generic drug: Riociguat Take 1 mg by mouth 3 (three) times daily.   alendronate 70 MG tablet Commonly known as: Fosamax Take 1 tablet (70 mg total) by mouth every 7 (seven) days. Take with a full glass of water on an empty stomach. What changed: additional instructions   amLODipine 5 MG tablet Commonly known as: NORVASC Take 1 tablet (5 mg total) by mouth daily.   Breztri Aerosphere 160-9-4.8 MCG/ACT Aero Generic drug: Budeson-Glycopyrrol-Formoterol Inhale 2 puffs into the lungs 2 (two) times daily.   cholecalciferol 25 MCG (1000 UNIT) tablet Commonly known as: VITAMIN D3 Take 1,000 Units by mouth daily.   losartan-hydrochlorothiazide 50-12.5 MG tablet Commonly known as: HYZAAR Take 1 tablet by mouth daily.   omega-3 acid ethyl esters 1 g capsule Commonly known as: LOVAZA Take 1 g by mouth 2 (two) times daily.   oxyCODONE-acetaminophen 5-325 MG tablet Commonly known as: Percocet Take 1 tablet by mouth every 6 (six) hours as needed for up to 5 days for moderate pain or severe pain.   potassium chloride SA 20 MEQ tablet Commonly known as: KLOR-CON Take 2 tablets (40 mEq total) by mouth 2 (two) times daily.   torsemide 20 MG tablet Commonly known as: DEMADEX Take 4 tablets (80 mg total) by mouth in the morning AND 2 tablets (40 mg  total) every evening.   Vitamin C 500 MG Caps Take 500 mg by mouth daily.   warfarin 4 MG tablet Commonly known as: COUMADIN Take as directed. If you are unsure how to take this medication, talk to your nurse or doctor. Original instructions: Take one-and-one-half (1 & 1/2)  tablets on Mondays and Thursdays. All other day, take one (1) tablet What changed:   how much to take  how to take this  when to take this  additional instructions   zinc gluconate 50 MG tablet Take 50 mg by mouth daily.       Disposition and follow-up:   Jesus Russell was discharged from [facility] in Stable condition.  At the hospital follow up visit please address:  1. Subacute compression fracture of T7: No surgical intervention.  On short course of Percocet  2.  Sarcoidosis: Follow-up with pulmonology  3.  Chronic respiratory failure 2L on nasal cannula: Continue supplemental oxygen  4.  Recurrent DVT/pulmonary embolism, positive lupus anticoagulant.  Status post IVC filter: Continue warfarin, follow-up with the anticoagulation clinic  5.  Right-sided heart failure/cor pulmonale/pulmonary pretension: Start Adempas, continue torsemide 80 mg in the morning and 40 mg in the evening.  Follow-up with the heart failure clinic  6.  COPD: Continue Breztri  -------- B.  Labs / imaging needed at time of follow-up: BMP  C.  Pending labs/ test needing follow-up: None  Follow-up Appointments:   Hospital Course by problem list: Jesus Russell is a 69 year old  African-American gentleman with medical history significant for compression fractures s/p kyphoplasty, sarcoidosis, chronic respiratory failure on 2 L nasal cannula, recurrent DVT and pulmonary embolism, positive lupus anticoagulant on warfarin, status post IVC filter, recent COVID-19 infection in January, hypertension, cor pulmonale/right-sided heart failure, pulmonary hypertension, CHF w/ reduced RV function with EF of 55 to 60% s/p  RHC revealing moderately pulmonary arterial hypertension & mildly elevated RV filling pressure, COPD, OSA here with left-sided pain secondary to compression fracture deemed nonsurgical by neurosurgery.   #Compression fracture deemed nonsurgical: Jesus Russell has a history of compression fractures status post kyphoplasty presenting with a 2-week history of left-sided pain.  CT angiography revealed mildly progressive T10 fracture, new subacute T7 vertebral body fracture.  His pain significantly improved in the emergency department with fentanyl.  His vital signs are unremarkable and he currently remains on 3 L nasal cannula without respiratory compromise.  He is stable to discharge today on a short course of percocet and follow-up with our clinic early next week.  Per chart review, there is an IR referral for kyphoplasty placed.  He is also to continue his alendronate.   #COPD: Continue Breztri   #Right-sided heart failure #Cor pulmonale Follows up with Jesus Russell and recently had right heart cath which revealed  moderately pulmonary arterial hypertension & mildly elevated RV filling pressure.  He was instructed to increase torsemide 80 mg in the morning and 40 mg in the evening. Continue Adempas and follow up with the Heart Failure Clinic    #Recurrent VTE's (DVT, pulmonary embolism) #Positive lupus anticoagulant #Status post IVC INR of 1.4 on admission.  We will have him continue his home warfarin and follow-up with anticoagulation clinic for close monitoring   #Hypertension -Continue amlodipine -Instructed to Hold losartan-HCTZ for one day  Discharge Vitals:   BP (!) 118/86   Pulse 83   Temp 98.3 F (36.8 C) (Oral)   Resp 18   SpO2 100%   Pertinent Labs, Studies, and Procedures:  DG Chest 2 View  Result Date: 07/14/2020 CLINICAL DATA:  Chest pain, history of sarcoidosis and pulmonary emboli EXAM: CHEST - 2 VIEW COMPARISON:  Radiograph 07/11/2020, CT 02/14/2020 FINDINGS:  Diffuse apical predominant reticular opacities with scarring and architectural distortion throughout both lungs similar to comparison and compatible with patient's history of sarcoidosis. May obscure underlying acute consolidation. No new opacities are clearly evident on this radiographic exam. Blunting of the costophrenic sulci likely related to prior scarring. No convincing pneumothorax or effusion. Stable cardiomediastinal contours accounting for differences in technique. No acute osseous or soft tissue abnormality. IMPRESSION: Diffuse apical predominant reticular opacities with scarring and architectural distortion throughout both lungs similar to comparison and compatible with patient's history of sarcoidosis. No gross acute cardiopulmonary abnormality is seen however underlying parenchymal disease may limit threshold for detection. Electronically Signed   By: Lovena Le M.D.   On: 07/14/2020 22:21   CT Angio Chest PE W and/or Wo Contrast  Result Date: 07/15/2020 CLINICAL DATA:  Shortness of breath with left chest pain. Pulmonary embolism suspected, high probability. EXAM: CT ANGIOGRAPHY CHEST WITH CONTRAST TECHNIQUE: Multidetector CT imaging of the chest was performed using the standard protocol during bolus administration of intravenous contrast. Multiplanar CT image reconstructions and MIPs were obtained to evaluate the vascular anatomy. CONTRAST:  88m OMNIPAQUE IOHEXOL 350 MG/ML SOLN COMPARISON:  Chest CTA 02/14/2020 and nuclear medicine perfusion study 07/11/2020 FINDINGS: Cardiovascular: The pulmonary arteries are well opacified with contrast to the level of the subsegmental branches. There  is chronic superior hilar retraction bilaterally with distortion of the pulmonary arteries and central enlargement consistent with pulmonary arterial hypertension. No recurrent pulmonary emboli are demonstrated. There is atherosclerosis of the aorta, great vessels and coronary arteries. Calcifications of the  aortic valve are present. The heart size is normal. There is no pericardial effusion. Mediastinum/Nodes: There are no enlarged mediastinal, hilar or axillary lymph nodes.Stable mildly prominent mediastinal lymph nodes, likely related to the patient's sarcoidosis. The thyroid gland, trachea and esophagus demonstrate no significant findings. Lungs/Pleura: There is no pleural effusion or pneumothorax. Widespread chronic lung disease with superior hilar retraction, architectural distortion, bronchiectasis and parenchymal scarring attributed to the patient's sarcoidosis. No superimposed airspace disease or developing suspicious nodularity. Upper abdomen: The visualized upper abdomen appears stable without significant findings. Musculoskeletal/Chest wall: Interval spinal augmentation at T8, T9 and T10. The T10 fracture has mildly progressed compared with the prior CT. There is a new fracture of the T7 vertebral body with approximately 75% loss of vertebral body height. This fracture is associated with paraspinal edema and is suspected to be acute/subacute. Review of the MIP images confirms the above findings. IMPRESSION: 1. No evidence of recurrent pulmonary emboli or other acute chest process. 2. Widespread chronic lung disease with superior hilar retraction, bronchiectasis and parenchymal scarring attributed to the patient's sarcoidosis. 3. Interval spinal augmentation at T8, T9 and T10. The T10 fracture has mildly progressed, and there is a new T7 vertebral body fracture with approximately 75% loss of vertebral body height. This fracture is associated with paraspinal edema and is suspected to be acute/subacute. 4. Aortic Atherosclerosis (ICD10-I70.0). Electronically Signed   By: Richardean Sale M.D.   On: 07/15/2020 12:55   Discharge Instructions: Discharge Instructions    Diet - low sodium heart healthy   Complete by: As directed    Discharge instructions   Complete by: As directed    Mr. Nicol,   It was a  pleasure taking care of you. You were admitted because of pain in your back due to compression fracture which is non-surgical. I will prescribe you with a short course of pain medications and have you see [place] in the clinic next week.   Please HOLD your blood pressure medication (Losartan-hydrochlorothiazide) today and tomorrow.   Please continue taking all your medications as prescribed.   Take care!   Increase activity slowly   Complete by: As directed       Signed: Jean Rosenthal, MD 07/15/2020, 4:34 PM   Pager: [phone] Internal Medicine Teaching Service

## 2020-07-15 NOTE — ED Provider Notes (Signed)
[facility] EMERGENCY DEPARTMENT Provider Note   CSN: [id number] Arrival date & time: 07/14/20  2124     History Chief Complaint  Patient presents with  . Chest Pain    Jesus Russell is a 69 y.o. male.   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  Patient reports for the last 3 weeks he has had left-sided chest pain.  He reports the pain would wax and wane and was always located on the left side but for the last 3 days the pain has been more persistent and severe.  It is sharp in nature and worse if he coughs, moves or tries to walk.  He denies any hemoptysis or productive cough.  He reports the lower extremity edema has been slightly improving but has not worsened.  He has not had fever and he feels today that his breathing is almost at baseline but may be slightly worse.  Patient had a VQ scan done prior to his cardiac catheterization on 7/10 which showed multiple filling defects which could be related to scarring from his sarcoidosis but recommended a CTA of his chest.  Patient reports he was called by Dr. Claris Gladden office yesterday to set that up.  However the pain became so severe last night he was having difficulty even walking to the bathroom so he decided to come to the emergency room.  Unfortunately patient has been waiting for approximately 10 hours before being seen.  He did receive a nebulized DuoNeb while waiting in the waiting room but is still reporting pain to his chest currently.  He reports he does  not take anything for the pain at home.  He has not noticed any rashes.  The history is provided by the patient.  Chest Pain      Past Medical History:  Diagnosis Date  . Acute on chronic respiratory failure with hypoxia ([place])   . COVID-19 virus infection   . DVT of lower extremity, bilateral ([place])   . Epistaxis   . Hypertension   . Incarcerated umbilical hernia 40/9811   Status post repair  . Lupus anticoagulant positive    Per records from [facility]  . 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 Pulmonary artery hypertension ([place]) 04/2011   Mildly elevated pulmonary artery pressure in setting of PE (from [facility])  . Pulmonary embolism associated with COVID-19 ([place])   . Subdural hematoma ([facility])    Remote episode, occurred when taking excedrin and warfarin.     Patient Active Problem List   Diagnosis Date Noted  . COPD (chronic obstructive pulmonary disease) ([place]) 07/13/2020  . Respiratory failure with hypoxia and hypercapnia ([place]) 05/20/2020  . 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 Personal history of covid-19   . Bleeding from the nose   . Lower extremity edema 02/03/2020  . Back pain 01/25/2020  . Compression fracture of T6 vertebra ([place]) 01/20/2020  . 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 Osteoporosis 01/20/2020  . Hepatic steatosis 10/29/2019  . Cholelithiasis 10/29/2019  . Obstructive sleep apnea 10/29/2019  . Need for pneumococcal vaccine 10/29/2019  . Hypertension 10/06/2019  . Encounter for central line placement 10/06/2019  . Long term (current) use of anticoagulants 09/14/2019  . History of DVT (deep vein thrombosis) 09/14/2019  . History of pulmonary embolism 09/14/2019  . Pulmonary embolism on long-term anticoagulation therapy ([place]) 09/09/2019  . Sarcoidosis 09/09/2019    Past Surgical History:  Procedure Laterality Date  . BRAIN SURGERY    . HERNIA REPAIR    . IR KYPHO  EA ADDL LEVEL THORACIC OR LUMBAR  05/04/2020  . IR KYPHO EA ADDL LEVEL THORACIC OR LUMBAR  05/04/2020  . IR KYPHO THORACIC WITH BONE BIOPSY  05/04/2020  . IR RADIOLOGIST EVAL & MGMT  04/07/2020  . IR RADIOLOGIST EVAL & MGMT  06/08/2020  . NASAL ENDOSCOPY WITH EPISTAXIS CONTROL N/A 02/24/2020   Procedure: NASAL ENDOSCOPY WITH EPISTAXIS CONTROL;  Surgeon: [name], MD;  Location: [facility] OR;  Service: ENT;  Laterality: N/A;  . RIGHT HEART CATH N/A 07/06/2020   Procedure: RIGHT HEART CATH;  Surgeon: [name], MD;  Location: [facility];  Service: Cardiovascular;  Laterality: N/A;       Family History  Adopted: Yes    Social History   Tobacco Use  . Smoking status: Former Smoker    Packs/day: 1.50    Years: 23.00    Pack years: 34.50    Types: Cigarettes    Quit date: 1995    Years since quitting: 26.5  . Smokeless tobacco: Never Used  Vaping Use  . Vaping Use: Never used  Substance Use Topics  . Alcohol use: Never  . Drug use: Not Currently    Types: "Crack" cocaine    Comment: quit 2008    Home Medications Prior to Admission medications   Medication Sig Start Date End Date Taking? Authorizing Provider  alendronate (FOSAMAX) 70 MG tablet Take 1 tablet (70 mg total) by mouth every 7 (seven) days. Take with a full glass of water on an empty stomach. Patient taking differently: Take 70 mg by mouth every 7 (seven) days. Take with a full glass of water on an empty stomach. Monday 03/31/20 03/31/21  Jeanmarie Hubert, MD  amLODipine (NORVASC) 5 MG tablet Take 1 tablet (5 mg total) by mouth daily. 03/31/20 03/31/21  Jeanmarie Hubert, MD  Ascorbic Acid (VITAMIN C) 500 MG CAPS Take 500 mg by mouth daily.    [provider]  Budeson-Glycopyrrol-Formoterol (BREZTRI AEROSPHERE) 160-9-4.8 MCG/ACT AERO Inhale 2 puffs into the lungs 2 (two) times daily. 07/12/20   Martyn Ehrich, NP  cholecalciferol (VITAMIN D3) 25 MCG (1000 UT) tablet Take 1,000 Units by mouth daily.     [provider]  losartan-hydrochlorothiazide (HYZAAR) 50-12.5 MG tablet Take 1 tablet by mouth daily.    [provider]  omega-3 acid ethyl esters (LOVAZA) 1 g capsule Take 1 g by mouth 2 (two) times daily.    [provider]  potassium chloride SA (KLOR-CON) 20 MEQ tablet Take 2 tablets (40 mEq total) by mouth 2 (two) times daily. 07/06/20   [name], MD  Riociguat (ADEMPAS) 1 MG TABS Take 1 mg by mouth 3 (three) times daily. Patient not taking: Reported on 07/12/2020 07/07/20   [name], MD  torsemide (DEMADEX) 20 MG tablet Take 4 tablets (80 mg total) by mouth in the  morning AND 2 tablets (40 mg total) every evening. 07/06/20   [name], MD  warfarin (COUMADIN) 4 MG tablet Take one-and-one-half (1 & 1/2)  tablets on Mondays and Thursdays. All other day, take one (1) tablet Patient taking differently: Take 2-4 mg by mouth See admin instructions. MONDAY 2 mg.  All the other days take 4 mg 04/25/20   Jorene Guest B, RPH-CPP  zinc gluconate 50 MG tablet Take 50 mg by mouth daily.    [provider]    Allergies    Patient has no known allergies.  Review of Systems   Review of Systems  Cardiovascular: Positive for chest pain.  All other systems reviewed and are negative.   Physical Exam Updated Vital Signs BP (!) 118/86   Pulse 83   Temp 98.3 F (36.8 C) (Oral)   Resp 18   SpO2 100%   Physical Exam Vitals and nursing note reviewed.  Constitutional:      General: He is not in acute distress.    Appearance: He is well-developed. He is ill-appearing.  HENT:     Head: Normocephalic and atraumatic.     Nose: Nose normal.     Mouth/Throat:     Mouth: Mucous membranes are moist.  Eyes:     Conjunctiva/sclera: Conjunctivae normal.     Pupils: Pupils are equal, round, and reactive to light.  Cardiovascular:     Rate and Rhythm: Normal rate and regular rhythm.     Pulses: Normal pulses.     Heart sounds: No murmur heard.    Pulmonary:     Effort: Tachypnea and accessory muscle usage present. No respiratory distress.     Breath sounds: Normal breath sounds. Decreased air movement present. No wheezing or rales.     Comments: Diffuse decreased air movement but no significant wheezing Abdominal:     General: There is distension.     Palpations: Abdomen is soft.     Tenderness: There is no abdominal tenderness. There is no guarding or rebound.  Musculoskeletal:        General: No tenderness. Normal range of motion.     Cervical back: Normal range of motion and neck supple.     Right lower leg: Edema present.     Left lower leg: Edema present.     Comments: 2-3+ pitting edema to the knee bilaterally  Skin:    General: Skin is warm and dry.     Capillary Refill: Capillary refill takes less than 2 seconds.     Findings: No erythema or rash.  Neurological:     General: No focal deficit present.     Mental Status: He is alert and oriented to person, place, and time. Mental status is at baseline.  Psychiatric:        Mood and Affect: Mood normal.        Behavior: Behavior normal.        Thought Content: Thought content normal.     ED Results / Procedures / Treatments   Labs (all labs ordered are listed, but only abnormal results are displayed) Labs Reviewed  BASIC METABOLIC PANEL - Abnormal; Notable for the following components:      Result Value   Chloride 90 (*)    CO2 37 (*)    Glucose, Bld 101 (*)    Creatinine, Ser 1.32 (*)    GFR calc non Af Amer 56 (*)    All other components within normal limits  CBC -  Abnormal; Notable for the following components:   WBC 11.4 (*)    RBC 3.91 (*)    Hemoglobin 10.4 (*)    HCT 36.2 (*)    MCHC 28.7 (*)    RDW 19.3 (*)    All other components within normal limits  PROTIME-INR - Abnormal; Notable for the following components:   Prothrombin Time 16.3 (*)    INR 1.4 (*)    All other components within normal limits  TROPONIN I (HIGH SENSITIVITY)  TROPONIN  I (HIGH SENSITIVITY)    EKG EKG Interpretation  Date/Time:  Thursday July 14 2020 21:33:30 EDT Ventricular Rate:  89 PR Interval:  176 QRS Duration: 124 QT Interval:  372 QTC Calculation: 452 R Axis:   -62 Text Interpretation: Normal sinus rhythm Right bundle branch block Left anterior fascicular block No significant change since last tracing Confirmed by Blanchie Dessert [phone]) on 07/15/2020 7:18:21 AM   Radiology DG Chest 2 View  Result Date: 07/14/2020 CLINICAL DATA:  Chest pain, history of sarcoidosis and pulmonary emboli EXAM: CHEST - 2 VIEW COMPARISON:  Radiograph 07/11/2020, CT 02/14/2020 FINDINGS: Diffuse apical predominant reticular opacities with scarring and architectural distortion throughout both lungs similar to comparison and compatible with patient's history of sarcoidosis. May obscure underlying acute consolidation. No new opacities are clearly evident on this radiographic exam. Blunting of the costophrenic sulci likely related to prior scarring. No convincing pneumothorax or effusion. Stable cardiomediastinal contours accounting for differences in technique. No acute osseous or soft tissue abnormality. IMPRESSION: Diffuse apical predominant reticular opacities with scarring and architectural distortion throughout both lungs similar to comparison and compatible with patient's history of sarcoidosis. No gross acute cardiopulmonary abnormality is seen however underlying parenchymal disease may limit threshold for detection. Electronically Signed   By: Lovena Le M.D.   On: 07/14/2020 22:21   CT Angio Chest PE W and/or Wo Contrast  Result Date: 07/15/2020 CLINICAL DATA:  Shortness of breath with left chest pain. Pulmonary embolism suspected, high probability. EXAM: CT ANGIOGRAPHY CHEST WITH CONTRAST TECHNIQUE: Multidetector CT imaging of the chest was performed using the standard protocol during bolus administration of intravenous contrast. Multiplanar CT image reconstructions  and MIPs were obtained to evaluate the vascular anatomy. CONTRAST:  49m OMNIPAQUE IOHEXOL 350 MG/ML SOLN COMPARISON:  Chest CTA 02/14/2020 and nuclear medicine perfusion study 07/11/2020 FINDINGS: Cardiovascular: The pulmonary arteries are well opacified with contrast to the level of the subsegmental branches. There is chronic superior hilar retraction bilaterally with distortion of the pulmonary arteries and central enlargement consistent with pulmonary arterial hypertension. No recurrent pulmonary emboli are demonstrated. There is atherosclerosis of the aorta, great vessels and coronary arteries. Calcifications of the aortic valve are present. The heart size is normal. There is no pericardial effusion. Mediastinum/Nodes: There are no enlarged mediastinal, hilar or axillary lymph nodes.Stable mildly prominent mediastinal lymph nodes, likely related to the patient's sarcoidosis. The thyroid gland, trachea and esophagus demonstrate no significant findings. Lungs/Pleura: There is no pleural effusion or pneumothorax. Widespread chronic lung disease with superior hilar retraction, architectural distortion, bronchiectasis and parenchymal scarring attributed to the patient's sarcoidosis. No superimposed airspace disease or developing suspicious nodularity. Upper abdomen: The visualized upper abdomen appears stable without significant findings. Musculoskeletal/Chest wall: Interval spinal augmentation at T8, T9 and T10. The T10 fracture has mildly progressed compared with the prior CT. There is a new fracture of the T7 vertebral body with approximately 75% loss of vertebral body height. This fracture is associated with paraspinal edema and is suspected to  be acute/subacute. Review of the MIP images confirms the above findings. IMPRESSION: 1. No evidence of recurrent pulmonary emboli or other acute chest process. 2. Widespread chronic lung disease with superior hilar retraction, bronchiectasis and parenchymal scarring  attributed to the patient's sarcoidosis. 3. Interval spinal augmentation at T8, T9 and T10. The T10 fracture has mildly progressed, and there is a new T7 vertebral body fracture with approximately 75% loss of vertebral body height. This fracture is associated with paraspinal edema and is suspected to be acute/subacute. 4. Aortic Atherosclerosis (ICD10-I70.0). Electronically Signed   By: Richardean Sale M.D.   On: 07/15/2020 12:55    Procedures Procedures (including critical care time)  Medications Ordered in ED Medications  sodium chloride flush (NS) 0.9 % injection 3 mL (has no administration in time range)  fentaNYL (SUBLIMAZE) injection 25 mcg (has no administration in time range)  ondansetron (ZOFRAN) injection 4 mg (has no administration in time range)  ipratropium-albuterol (DUONEB) 0.5-2.5 (3) MG/3ML nebulizer solution 3 mL (3 mLs Nebulization Given 07/15/20 7619)    ED Course  I have reviewed the triage vital signs and the nursing notes.  Pertinent labs & imaging results that were available during my care of the patient were reviewed by me and considered in my medical decision making (see chart for details).    MDM Rules/Calculators/A&P                          Patient presenting who is chronically ill with multiple medical problems today with worsening left-sided chest pain.  Patient has had recurrent PEs and DVTs despite being on warfarin and having an IVC filter.  Patient reports he did hold his warfarin for 2 days before his cardiac catheterization and then resumed.  Pain was present prior to that but in the last 3 days it has become severe to where it is affecting his mobility.  Patient also has fluid overload from severe pulmonary hypertension and has recently been changing medications.  He reports this is slightly improved but definitely no worse.  He reports his breathing is mostly baseline slightly worse but does not have significant wheezing today to suggest COPD exacerbation.   Patient is currently off steroids.  Pain is not reproducible with palpation however can be reproduced with deep breathing.  He did have a VQ scan done 12 days ago and at that time it showed multiple filling defects which could be scar tissue versus PE.  He was scheduled to get a CTA of his chest next week but given worsening pain will do the testing today for further evaluation.  Patient's troponin today is within normal limits at 10, BMP with creatinine of 1.3 from prior of 1.18 which may be due to the new dosing of torsemide.  CBC with stable hemoglobin of 10 white count of 11,000, EKG with recurrent right bundle branch block but no significant changes and chest x-ray today with diffuse apical predominant reticular opacity with scarring and architectural distortion throughout both lungs compatible with sarcoidosis.  No significant gross abnormality was noted from baseline.  Patient is on 3 L at this time and sats are in the upper 90s to 100s.  1:11 PM Patient had some improvement of the pain with fentanyl however reports the pain is now starting to come back.  INR subtherapeutic today at 1.4.  Patient's CT is negative for PE but shows a new T7 vertebral body fracture with approximately 75% loss of vertebral body height  and paraspinal edema.  This is most likely the cause of the patient's new pain given no other acute lung findings are present.  Patient states that he has a hard time taking narcotics at home due to his breathing status and right now because the pain is so severe he cannot even get up which he normally is able to stand and function around his house.  Will give another dose of pain control patient will need Lovenox shots until Coumadin is therapeutic.  1:37 PM Unclear if pt would be candidate for kyphoplasty.  Pt is not a neurosurgical candidate.  Will discuss with hospitalization for pain control and possible intervention of compression fx.  MDM Number of Diagnoses or Management Options     Amount and/or Complexity of Data Reviewed Clinical lab tests: ordered and reviewed Tests in the radiology section of CPT: ordered and reviewed Tests in the medicine section of CPT: ordered and reviewed Decide to obtain previous medical records or to obtain history from someone other than the patient: yes Obtain history from someone other than the patient: yes Review and summarize past medical records: yes Discuss the patient with other providers: yes Independent visualization of images, tracings, or specimens: yes  Risk of Complications, Morbidity, and/or Mortality Presenting problems: high Diagnostic procedures: moderate Management options: low  Patient Progress Patient progress: stable   Final Clinical Impression(s) / ED Diagnoses Final diagnoses:  Compression fracture of T7 vertebra, initial encounter ([place])  Subtherapeutic international normalized ratio (INR)  Severe pain    Rx / DC Orders ED Discharge Orders    None       Blanchie Dessert, MD 07/15/20 1348

## 2020-07-15 NOTE — H&P (Signed)
Date: 07/15/2020               Patient Name:  Jesus Russell MRN: [id number]  DOB: [birth year] Age / Sex: 69 y.o., male   PCP: Madalyn Rob, MD         Medical Service: Internal Medicine Teaching Service         Attending Physician: Dr. Daryll Drown    First Contact: Dr. Collene Gobble Pager: [phone]  Second Contact: Dr. Eileen Stanford Pager: [phone]       After Hours (After 5p/  First Contact Pager: [phone]  weekends / holidays): Second Contact Pager: [phone]   Chief Complaint: Left side pain  History of Present Illness: Jesus Russell is a 69 year old African-American gentleman with medical history significant for compression fractures s/p kyphoplasty, sarcoidosis, chronic respiratory failure on 2 L nasal cannula, recurrent DVT and pulmonary embolism, positive lupus anticoagulant on warfarin, status post IVC filter, recent COVID-19 infection in January, cor pulmonale/right-sided heart failure, pulmonary hypertension, CHF w/ reduced RV function with EF of 55 to 60% s/p RHC revealing moderately pulmonary arterial hypertension & mildly elevated RV filling pressure, COPD, OSA who presented to [facility] emergency department on July 15, 2020 with progressively worsening left-sided chest pain and chronic shortness of breath.  Jesus Russell reports that he was in his usual state of health until about 2 weeks ago when he began experiencing left-sided chest pain that has been getting progressively worse.  He does not report of any inciting trauma that preceded this pain however the pain worsens with cough and ambulation.  He does have chronic respiratory failure on 2 L nasal cannula and during this time he has not had to increase his supplemental oxygen.  He denies cough, sputum production, fevers, chills.  Per chart review, his warfarin dose has been held briefly for his recent right heart cath.  A VQ scan performed before the right heart cath showed possible perfusion defects and was instructed to follow-up with a CT  angiography chest which was performed in the emergency department today.  Findings did not reveal acute pulmonary embolism but it did show mildly progressive T10 fracture and new T7 vertebral body fracture.  In the emergency department he received DuoNeb, 25 mcg of fentanyl plus an additional 12.5 mcg fentanyl and he reported resolution of his pain.  ED provider reached out to neurosurgery who recommended no surgical intervention.   Meds:  Current Meds  Medication Sig  . alendronate (FOSAMAX) 70 MG tablet Take 1 tablet (70 mg total) by mouth every 7 (seven) days. Take with a full glass of water on an empty stomach. (Patient taking differently: Take 70 mg by mouth every 7 (seven) days. Take with a full glass of water on an empty stomach. Monday)  . amLODipine (NORVASC) 5 MG tablet Take 1 tablet (5 mg total) by mouth daily.  . Ascorbic Acid (VITAMIN C) 500 MG CAPS Take 500 mg by mouth daily.  . Budeson-Glycopyrrol-Formoterol (BREZTRI AEROSPHERE) 160-9-4.8 MCG/ACT AERO Inhale 2 puffs into the lungs 2 (two) times daily.  . cholecalciferol (VITAMIN D3) 25 MCG (1000 UT) tablet Take 1,000 Units by mouth daily.  Marland Kitchen losartan-hydrochlorothiazide (HYZAAR) 50-12.5 MG tablet Take 1 tablet by mouth daily.  Marland Kitchen omega-3 acid ethyl esters (LOVAZA) 1 g capsule Take 1 g by mouth 2 (two) times daily.  . potassium chloride SA (KLOR-CON) 20 MEQ tablet Take 2 tablets (40 mEq total) by mouth 2 (two) times daily.  Marland Kitchen torsemide (DEMADEX) 20 MG tablet Take  4 tablets (80 mg total) by mouth in the morning AND 2 tablets (40 mg total) every evening.  . warfarin (COUMADIN) 4 MG tablet Take one-and-one-half (1 & 1/2)  tablets on Mondays and Thursdays. All other day, take one (1) tablet (Patient taking differently: Take 2-4 mg by mouth See admin instructions. MONDAY 2 mg.  All the other days take 4 mg)  . zinc gluconate 50 MG tablet Take 50 mg by mouth daily.     Allergies: Allergies as of 07/14/2020  . (No Known Allergies)    Past Medical History:  Diagnosis Date  . Acute on chronic respiratory failure with hypoxia ([place])   . COVID-19 virus infection   . DVT of lower extremity, bilateral ([place])   . Epistaxis   . Hypertension   . Incarcerated umbilical hernia 62/6948   Status post repair  . Lupus anticoagulant positive    Per records from [facility]  . 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 Pulmonary artery hypertension ([place]) 04/2011   Mildly elevated pulmonary artery pressure in setting of PE (from [facility])  . Pulmonary embolism associated with COVID-19 ([place])   . Subdural hematoma (HCC)    Remote episode, occurred when taking excedrin and warfarin.     Family History: Unremarkable, adopted  Social History   Socioeconomic History  . Marital status: Divorced    Spouse name: Not on file  . Number of children: Not on file  . Years of education: Not on file  . Highest education level: Not on file  Occupational History  . Occupation: retired Pharmacist, hospital  Tobacco Use  . Smoking status: Former Smoker    Packs/day: 1.50    Years: 23.00    Pack years: 34.50    Types: Cigarettes    Quit date: 1995    Years since quitting: 26.5  . Smokeless tobacco: Never Used  Vaping Use  . Vaping Use: Never used  Substance and Sexual Activity  . Alcohol use: Never  . Drug use: Not Currently    Types: "Crack" cocaine    Comment: quit 2008  . Sexual activity: Not on file  Other Topics Concern  . Not on file  Social History Narrative  . Not on file   Social Determinants of Health   Financial Resource Strain: Low Risk   . Difficulty of Paying Living Expenses: Not very hard  Food Insecurity:   . Worried About Charity fundraiser in the Last Year:   . Arboriculturist in the Last Year:   Transportation Needs:   . Film/video editor (Medical):   Marland Kitchen Lack of Transportation (Non-Medical):   Physical Activity:   . Days of Exercise per Week:    . Minutes of Exercise per Session:   Stress:   . Feeling of Stress :   Social Connections:   . Frequency of Communication with Friends and Family:   . Frequency of Social Gatherings with Friends and Family:   . Attends Religious Services:   . Active Member of Clubs or Organizations:   . Attends Archivist Meetings:   Marland Kitchen Marital Status:   Intimate Partner Violence:   . Fear of Current or Ex-Partner:   . Emotionally Abused:   Marland Kitchen Physically Abused:   . Sexually Abused:     Review of Systems: A complete ROS was negative except as per HPI.   Physical Exam: Blood pressure Marland Kitchen)  118/86, pulse 83, temperature 98.3 F (36.8 C), temperature source Oral, resp. rate 18, SpO2 100 %. Physical Exam Vitals reviewed.  Constitutional:      General: He is not in acute distress.    Appearance: He is obese.  HENT:     Head: Normocephalic and atraumatic.  Cardiovascular:     Rate and Rhythm: Normal rate.  No extrasystoles are present.    Heart sounds:   No systolic murmur is present.   Pulmonary:     Effort: Pulmonary effort is normal. No tachypnea, accessory muscle usage or respiratory distress.     Breath sounds: No stridor. Wheezes: Mild wheezing. Rales: Mild coarse rales.  Chest:     Chest wall: No mass.  Abdominal:     General: Bowel sounds are normal.     Palpations: Abdomen is soft.  Musculoskeletal:     Cervical back: Neck supple.     Right lower leg: No tenderness. Edema (1-2 +) present.     Left lower leg: No tenderness. Edema (1-2 +) present.  Skin:    General: Skin is warm.     Coloration: Skin is not cyanotic.  Psychiatric:        Mood and Affect: Mood is not anxious.        Behavior: Behavior is not agitated.     EKG: personally reviewed my interpretation is NS, old RBBB  CXR: personally reviewed my interpretation is old interstitial changes secondary to sarcoidosis.  Assessment & Plan by Problem: Active Problems:   Pulmonary embolism on long-term  anticoagulation therapy ([place])   Sarcoidosis   Long term (current) use of anticoagulants   History of DVT (deep vein thrombosis)   Obstructive sleep apnea   Osteoporosis   Back pain   Chronic respiratory failure (HCC)   Right heart failure (HCC)   COPD (chronic obstructive pulmonary disease) (HCC)   Subacute Compression fracture of body of thoracic vertebra [facility])   Jesus Russell is a 69 year old African-American gentleman with medical history significant for compression fractures s/p kyphoplasty, sarcoidosis, chronic respiratory failure on 2 L nasal cannula, recurrent DVT and pulmonary embolism, positive lupus anticoagulant on warfarin, status post IVC filter, recent COVID-19 infection in January, hypertension, cor pulmonale/right-sided heart failure, pulmonary hypertension, CHF w/ reduced RV function with EF of 55 to 60% s/p RHC revealing moderately pulmonary arterial hypertension & mildly elevated RV filling pressure, COPD, OSA here with left-sided pain secondary to compression fracture deemed nonsurgical by neurosurgery.   #Compression fracture deemed nonsurgical: Jesus Russell has a history of compression fractures status post kyphoplasty presenting with a 2-week history of left-sided pain.  CT angiography revealed mildly progressive T10 fracture, new subacute T7 vertebral body fracture.  His pain significantly improved in the emergency department with fentanyl.  His vital signs are unremarkable and he currently remains on 3 L nasal cannula without respiratory compromise.  He is stable to discharge today on a short course of percocet and follow-up with our clinic early next week.  Per chart review, there is an IR referral for kyphoplasty placed.  He is also to continue his alendronate.   #COPD -Continue Breztri   #Right-sided heart failure #Cor pulmonale Follows up with Dr. Algernon Huxley and recently had right heart cath which revealed  moderately pulmonary arterial hypertension & mildly elevated RV  filling pressure.  He was instructed to increase torsemide 80 mg in the morning and 40 mg in the evening -Continue torsemide 80 mg in the morning and 40 mg in the evening -  Continue Adempas (riociguat) is a stimulator of soluble guanylate cyclase ([place]) -Follow-up with heart failure   #Recurrent VTE's (DVT, pulmonary embolism) #Positive lupus anticoagulant #Status post IVC INR of 1.4 today.  We will have him continue his home warfarin and follow-up with anticoagulation clinic for close monitoring   #Hypertension -Continue amlodipine -Hold losartan-HCTZ for today   Dispo: Admit patient to Observation with expected length of stay less than 2 midnights.  Signed: Jean Rosenthal, MD 07/15/2020, 4:24 PM  Pager: _0 @ After 5pm on weekdays and 1pm on weekends: On Call pager: [phone]

## 2020-07-18 ENCOUNTER — Encounter (HOSPITAL_COMMUNITY): Payer: Medicare Other | Admitting: Cardiology

## 2020-07-18 ENCOUNTER — Telehealth: Payer: Self-pay | Admitting: Internal Medicine

## 2020-07-18 NOTE — Telephone Encounter (Signed)
TOC HFU PER DR AGYEI FOR 07/22/2020 @ 1: 45 PM [place] WITH DR BLOOMFIELD.

## 2020-07-21 ENCOUNTER — Other Ambulatory Visit: Payer: Self-pay | Admitting: *Deleted

## 2020-07-21 ENCOUNTER — Other Ambulatory Visit: Payer: Self-pay

## 2020-07-21 MED ORDER — OMEGA-3-ACID ETHYL ESTERS 1 G PO CAPS: 1.0000 g | ORAL_CAPSULE | Freq: Two times a day (BID) | ORAL | 0 refills | Status: DC

## 2020-07-21 NOTE — Telephone Encounter (Signed)
Called pt, sent request to blue team

## 2020-07-21 NOTE — Telephone Encounter (Signed)
Called [facility] to check on status of Adempas enrollment. Adempas was shipped to patient on 07/14/20 from [place].   Audry Riles, PharmD, BCPS, BCCP, CPP Heart Failure Clinic Pharmacist [phone]

## 2020-07-21 NOTE — Telephone Encounter (Signed)
Please call pt back about omega-3 acid ethyl esters (LOVAZA) 1 g capsule.

## 2020-07-22 ENCOUNTER — Encounter: Payer: Medicare Other | Admitting: Internal Medicine

## 2020-07-25 ENCOUNTER — Other Ambulatory Visit (HOSPITAL_COMMUNITY): Payer: Medicare Other

## 2020-07-26 ENCOUNTER — Encounter (HOSPITAL_COMMUNITY): Payer: Medicare Other | Admitting: Cardiology

## 2020-07-27 ENCOUNTER — Encounter (HOSPITAL_BASED_OUTPATIENT_CLINIC_OR_DEPARTMENT_OTHER): Payer: Medicare Other | Admitting: Pulmonary Disease

## 2020-07-29 ENCOUNTER — Other Ambulatory Visit: Payer: Self-pay | Admitting: Internal Medicine

## 2020-07-29 NOTE — Telephone Encounter (Signed)
Furosemide was discontinued by cardiology earlier this year, instead placed on torsemide. Please ask the patient to call us if he is having worsening symptoms, but otherwise would not restart his furosemide at this time.

## 2020-08-01 ENCOUNTER — Ambulatory Visit (INDEPENDENT_AMBULATORY_CARE_PROVIDER_SITE_OTHER): Payer: Medicare Other | Admitting: Primary Care

## 2020-08-01 ENCOUNTER — Other Ambulatory Visit: Payer: Self-pay

## 2020-08-01 ENCOUNTER — Encounter: Payer: Self-pay | Admitting: Primary Care

## 2020-08-01 DIAGNOSIS — J449 Chronic obstructive pulmonary disease, unspecified: Secondary | ICD-10-CM | POA: Diagnosis not present

## 2020-08-01 DIAGNOSIS — G4733 Obstructive sleep apnea (adult) (pediatric): Secondary | ICD-10-CM

## 2020-08-01 MED ORDER — BREZTRI AEROSPHERE 160-9-4.8 MCG/ACT IN AERO: 2.0000 | INHALATION_SPRAY | Freq: Two times a day (BID) | RESPIRATORY_TRACT | 5 refills | Status: DC

## 2020-08-01 NOTE — Patient Instructions (Addendum)
Pleasure speaking with you today Mr Ausborn  Recommendations: Continue Breztri two puffs twice daily (rinse mouth after use)- prescription sent   Orders: Needs home sleep study re: OSA  PFTs in 3 months   Follow-up: 3 months with Dr. Shearon Stalls or sooner if needed

## 2020-08-01 NOTE — Progress Notes (Signed)
Virtual Visit via Telephone Note  I connected with Jesus Russell on 08/01/20 at 10:00 AM EDT by telephone and verified that I am speaking with the correct person using two identifiers.  Location: Patient: Home Provider: Office   I discussed the limitations, risks, security and privacy concerns of performing an evaluation and management service by telephone and the availability of in person appointments. I also discussed with the patient that there may be a patient responsible charge related to this service. The patient expressed understanding and agreed to proceed.   History of Present Illness:  69 year old male, former smoker quit 1990 (34-pack-year history). Past medical history significant for sarcoidosis dx 1990 by bronchoscopy/biopsy (weaned off prednisone), pulmonary embolism (on anticoagulation), hypertension, right heart failure, pulmonary hypertension, obstructive sleep apnea, COVID-19 in January 2021.  Patient of Dr. Shearon Stalls, last seen by pulmonary nurse practitioner for virtual telephone visit on 06/20/2020.  Patient felt to have continued fluid overload despite Zaroxolyn every other day.  Continues Lasix 40 mg daily.  He was ordered for labs.  Following with cardiology, scheduled visit on 06/29/2020.  Previous [facility] pulmonary encounter:  07/12/2020 Patient presents today for a 2-week follow-up visit for dyspnea felt to be related to heart failure and deconditioning. Patient reported 6-7 week increase in exertion dyspnea. He was referred to cardiology and seen on July 7th. He was started on Torsemide 28m in the morning and 485min the evening. He underwent right heart cath on 07/06/20 with Dr. McAundra DubinPatient started on Adempas, awaiting prior authoriation. Reports that his weight is coming down. He is still short of breath, gets winded when standing. He remains on 2-3L oxygen. He never completed sleep study because he can not sleep on his back to compression fractures. Not currently CPAP.  Spirometry showed moderate restrictive lung disease with positive bronchodilator response. States that he has difficulty using BREO. His next follow-up with cardiology is August 3rd. He quit smoking tobacco in 1990 and crack cocaine/marijuana 2008. He likely has underlying COPD. Will trial Breztri.   08/01/2020- Interim hx Patient contacted today for 2 week follow-up. He was given trial Breztri at last visit d/t suspected underlying COPD. Spirometry showed moderate restrictive lung disease with positive bronchodilator response. He reported difficulty using BREO Ellipta inhaler. He has been using Breztri twice a day as instructed, he would like to continue. Breathing is about the same, this inhaler is easier for him to use. He has not tried using aero chamber with inhaler, he has a couple at home available to use. Respiratory symptoms are baseline. He has lost 10 lbs. He continues to take Torsemide 8033mn the morning and 33m56m the evening. He will be follow up with cardiology end of the month. He was unable to complete in-lab sleep study d/t back pain. He is sleeping in recliner. We will need to check home sleep study.   Observations/Objective:  - Able to speak in full sentences   Echo in 5/21 showed EF 55-60% with D-shaped septum and severely dilated/severely dysfunctional RV.  No estimation of PA pressure was available.     Assessment and Plan:   OSA: - Not currently on CPAP - Unable to complete PFTs d/t back pain, needs HST   Dyspnea: - Dyspnea felt to be multifactorial d/t heart failure and possible underlying COPD - Pulmonary function testing in 2019 showed moderate restrictive lung disease with positive bronchodilator.  Based on clinical symptoms and borderline FEV1/FVC ratio of 70 would recommend triple therapy for COPD/asthma overlap.  -  Continue Breztri two puffs twice daily  - Needs repeat PFTs prior to next visit (if no obstruction on PFTs would change to ICS/LABA hfa)  Heart  failure: - Continue Adempas and Torsemide 84m once daily in the morning and 490min the evening - Follows with Cardiology   Sarcoidosis: - No active symptoms   Follow Up Instructions:   3 months with Dr. DeShearon StallsI discussed the assessment and treatment plan with the patient. The patient was provided an opportunity to ask questions and all were answered. The patient agreed with the plan and demonstrated an understanding of the instructions.   The patient was advised to call back or seek an in-person evaluation if the symptoms worsen or if the condition fails to improve as anticipated.  I provided 22 minutes of non-face-to-face time during this encounter.   ElMartyn EhrichNP

## 2020-08-03 ENCOUNTER — Other Ambulatory Visit: Payer: Self-pay

## 2020-08-03 ENCOUNTER — Encounter: Payer: Self-pay | Admitting: Internal Medicine

## 2020-08-03 ENCOUNTER — Ambulatory Visit (INDEPENDENT_AMBULATORY_CARE_PROVIDER_SITE_OTHER): Payer: Medicare Other | Admitting: Internal Medicine

## 2020-08-03 VITALS — BP 112/76 | HR 88 | Temp 98.6°F | Ht 68.0 in | Wt 226.9 lb

## 2020-08-03 DIAGNOSIS — I50813 Acute on chronic right heart failure: Secondary | ICD-10-CM

## 2020-08-03 NOTE — Patient Instructions (Addendum)
Thank you for trusting me with your care. To recap, today we discussed the following:   1. Acute on chronic right-sided heart failure (HCC)   - CMP today - Torsemide 80 mg BID Take at 8 am and 2 pm - Heart failure at home will contact you. A nurse will be out to see you tomorrow.

## 2020-08-03 NOTE — Progress Notes (Signed)
CC: legs and abdomen are swollen   HPI:Jesus Russell is a 69 y.o. male who presents for evaluation of lower extremity swelling. Please see individual problem based A/P for details.   Past Medical History:  Diagnosis Date  . Acute on chronic respiratory failure with hypoxia ([place])   . COVID-19 virus infection   . DVT of lower extremity, bilateral ([place])   . Epistaxis   . Hypertension   . Incarcerated umbilical hernia 46/2194   Status post repair  . Lupus anticoagulant positive    Per records from [facility]  . 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 Pulmonary artery hypertension ([place]) 04/2011   Mildly elevated pulmonary artery pressure in setting of PE (from [facility])  . Pulmonary embolism associated with COVID-19 ([place])   . Subdural hematoma (HCC)    Remote episode, occurred when taking excedrin and warfarin.    Review of Systems:   ROS negative except as mentioned in individual problem based A/P.    Physical Exam: Vitals:   08/03/20 1355  BP: 112/76  Pulse: 88  Temp:  98.6 F (37 C)  TempSrc: Oral  SpO2: 93%  Weight: 226 lb 14.4 oz (102.9 kg)  Height: 5' 8" (1.727 m)    General: chronically ill appearing, wearing nasal canula Cardiovascular: Normal rate, regular rhythm.  No murmurs, rubs, or gallops, Expect JVD but unappreciable due to habitus and neck curvature Pulmonary : CTAB, No wheezes, rales, or rhonchi Abdominal: distended, soft  Musculoskeletal: 3+ pitting edema , edema up to abdomen Psychiatric/Behavioral:  normal mood, normal behavior   Assessment & Plan:   See Encounters Tab for problem based charting.  Patient discussed with Dr. Rebeca Alert

## 2020-08-04 LAB — CMP14 + ANION GAP
ALT: 8 IU/L (ref 0–44)
AST: 14 IU/L (ref 0–40)
Albumin/Globulin Ratio: 1.4 (ref 1.2–2.2)
Albumin: 4 g/dL (ref 3.8–4.8)
Alkaline Phosphatase: 82 IU/L (ref 48–121)
Anion Gap: 12 mmol/L (ref 10.0–18.0)
BUN/Creatinine Ratio: 19 (ref 10–24)
BUN: 14 mg/dL (ref 8–27)
Bilirubin Total: 0.3 mg/dL (ref 0.0–1.2)
CO2: 34 mmol/L — ABNORMAL HIGH (ref 20–29)
Calcium: 9.2 mg/dL (ref 8.6–10.2)
Chloride: 96 mmol/L (ref 96–106)
Creatinine, Ser: 0.73 mg/dL — ABNORMAL LOW (ref 0.76–1.27)
GFR calc Af Amer: 111 mL/min/{1.73_m2} (ref >59)
GFR calc non Af Amer: 96 mL/min/{1.73_m2} (ref >59)
Globulin, Total: 2.9 g/dL (ref 1.5–4.5)
Glucose: 86 mg/dL (ref 65–99)
Potassium: 5.1 mmol/L (ref 3.5–5.2)
Sodium: 142 mmol/L (ref 134–144)
Total Protein: 6.9 g/dL (ref 6.0–8.5)

## 2020-08-05 ENCOUNTER — Encounter: Payer: Self-pay | Admitting: Internal Medicine

## 2020-08-05 MED ORDER — TORSEMIDE 20 MG PO TABS: ORAL_TABLET | ORAL | 3 refills | Status: DC

## 2020-08-05 NOTE — Assessment & Plan Note (Addendum)
Patient presented to clinic with increased signs of right sided heart failure. Approximately 10 lbs up since being seen on 7/23, and noted to have 1+ pitting edema then.  No increase SOB from baseline.Currently prescribe Torsemide 80 mg in the am, and 40 mg in the pm. He has not been taking his evening dose of Torsemide. Patient is followed by Dr.Mclean , heart failure cardiology. His PA pressure was 39 on heart cath last month. He was started on Adempas. Given patient has not been taking his Torsemide as prescribe we will contact the heart failure at home program for their help in monitoring patient and diuresing as outpatient. We will increase his Torsemide to 80 mg BID and check a CMP today.  I have spoke to Dr.Brandon Winfrey with Heart Failure at [facility] and he is arranging for patient to be checked on 8/12. I gave patient instructions to take 80 mg of Torsemide when he gets home.

## 2020-08-07 ENCOUNTER — Encounter: Payer: Self-pay | Admitting: Primary Care

## 2020-08-08 ENCOUNTER — Other Ambulatory Visit: Payer: Self-pay | Admitting: Pharmacist

## 2020-08-08 ENCOUNTER — Telehealth: Payer: Self-pay | Admitting: Primary Care

## 2020-08-08 ENCOUNTER — Ambulatory Visit (INDEPENDENT_AMBULATORY_CARE_PROVIDER_SITE_OTHER): Payer: Medicare Other | Admitting: Pharmacist

## 2020-08-08 ENCOUNTER — Ambulatory Visit (INDEPENDENT_AMBULATORY_CARE_PROVIDER_SITE_OTHER): Payer: Medicare Other | Admitting: Internal Medicine

## 2020-08-08 ENCOUNTER — Encounter: Payer: Self-pay | Admitting: Internal Medicine

## 2020-08-08 ENCOUNTER — Other Ambulatory Visit: Payer: Self-pay | Admitting: Internal Medicine

## 2020-08-08 ENCOUNTER — Other Ambulatory Visit: Payer: Self-pay

## 2020-08-08 VITALS — BP 91/68 | HR 93 | Temp 97.8°F | Ht 68.0 in | Wt 206.8 lb

## 2020-08-08 DIAGNOSIS — I10 Essential (primary) hypertension: Secondary | ICD-10-CM

## 2020-08-08 DIAGNOSIS — I50813 Acute on chronic right heart failure: Secondary | ICD-10-CM | POA: Diagnosis not present

## 2020-08-08 DIAGNOSIS — I2699 Other pulmonary embolism without acute cor pulmonale: Secondary | ICD-10-CM

## 2020-08-08 DIAGNOSIS — Z86718 Personal history of other venous thrombosis and embolism: Secondary | ICD-10-CM

## 2020-08-08 DIAGNOSIS — Z86711 Personal history of pulmonary embolism: Secondary | ICD-10-CM

## 2020-08-08 DIAGNOSIS — Z7901 Long term (current) use of anticoagulants: Secondary | ICD-10-CM

## 2020-08-08 DIAGNOSIS — I110 Hypertensive heart disease with heart failure: Secondary | ICD-10-CM

## 2020-08-08 LAB — POCT INR: INR: 2.2 (ref 2.0–3.0)

## 2020-08-08 MED ORDER — TRELEGY ELLIPTA 200-62.5-25 MCG/INH IN AEPB: 1.0000 | INHALATION_SPRAY | Freq: Every day | RESPIRATORY_TRACT | 2 refills | Status: DC

## 2020-08-08 MED ORDER — LOSARTAN POTASSIUM 25 MG PO TABS: 25.0000 mg | ORAL_TABLET | Freq: Every day | ORAL | 2 refills | Status: DC

## 2020-08-08 NOTE — Telephone Encounter (Signed)
Please send in prescription for Trelegy. Discontinue Breztri and send in RX for trelegy 200 one puff daily. Her past PFTs showed BD response.

## 2020-08-08 NOTE — Patient Instructions (Addendum)
Thank you for trusting me with your care. To recap, today we discussed the following:   Heart Failure The heart failure at home program will continue to follow you as an outpatient and manage your treatment . You are making good progress based on my exam.   Hypertension - Stop amlodopine - Stop Losartan - HCTZ - Start  losartan (COZAAR) 25 MG tablet; Take 1 tablet (25 mg total) by mouth daily.  Dispense: 30 tablet; Refill: 2

## 2020-08-08 NOTE — Patient Instructions (Signed)
Patient instructed to take medications as defined in the Anti-coagulation Track section of this encounter.  Patient instructed to take today's dose.  Patient instructed to take one of your 69m strength blue warfarin tablets, by mouth, once-daily at 6PM--EXCEPT on MONDAYS--take ONLY 1/2 tablet on MONDAYS. Home Health Agency Nurse is to collect a point-of-care, fingerstick INR on Wednesday, 18-AUG-21 and text results to me at:  [phone] Patient verbalized understanding of these instructions.

## 2020-08-08 NOTE — Progress Notes (Signed)
° °  CC: lots of fluid on my belly  HPI:Jesus Russell is a 69 y.o. male who presents for evaluation of right sided heart failure and hx of hyptertension. Please see individual problem based A/P for details.  Past Medical History:  Diagnosis Date   Acute on chronic respiratory failure with hypoxia (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    Review of Systems:   ROS negative except as mentioned in individual problem based A/P.    Physical Exam: Vitals:   08/08/20 1358  BP: 91/68  Pulse: 93  Temp: 97.8 F (36.6 C)  TempSrc: Oral  SpO2: 98%  Weight: 206 lb 12.8 oz (93.8 kg)  Height: _0  (1.727 m)   BP Readings from Last 3 Encounters:  08/08/20 91/68  08/03/20 112/76  07/15/20 116/84    General: Chronically ill appearing , NAD Cardiovascular: Normal rate, regular rhythm.  No murmurs, rubs, or gallops Pulmonary : Course crackles at bases, no wheezes Abdominal: soft, distended,  bowel sounds present Musculoskeletal: 1+ edema in bilateral lower extremities, no deformities    Assessment & Plan:   See Encounters Tab for problem based charting.  Patient discussed with Dr. Angelia Mould

## 2020-08-08 NOTE — Progress Notes (Signed)
Anticoagulation Management Jesus Russell is a 69 y.o. male who reports to the clinic for monitoring of warfarin treatment.    Indication: PE , History of (resolved); DVT (history of, resolved); Long term current use of anticoagulant warfarin to maintain INR 2.0 - 3.0.  Duration: indefinite Supervising physician: Joni Reining  Anticoagulation Clinic Visit History: Patient does not report signs/symptoms of bleeding or thromboembolism  Other recent changes: No diet, medications, lifestyle changes EXCEPT AS NOTED IN PATIENT FINDINGS.  Anticoagulation Episode Summary    Current INR goal:  2.0-3.0  TTR:  54.6 % (9.1 mo)  Next INR check:  08/10/2020  INR from last check:  2.2 (08/08/2020)  Weekly max warfarin dose:    Target end date:  Indefinite  INR check location:    Preferred lab:    Send INR reminders to:     Indications   Long term (current) use of anticoagulants [Z79.01] History of DVT (deep vein thrombosis) [Z86.718] Pulmonary embolism on long-term anticoagulation therapy (HCC) [I26.99 T45.515A] History of pulmonary embolism [Z86.711]       Comments:          No Known Allergies  Current Outpatient Medications:  .  alendronate (FOSAMAX) 70 MG tablet, Take 1 tablet (70 mg total) by mouth every 7 (seven) days. Take with a full glass of water on an empty stomach. (Patient taking differently: Take 70 mg by mouth every 7 (seven) days. Take with a full glass of water on an empty stomach. Monday), Disp: 12 tablet, Rfl: 3 .  Ascorbic Acid (VITAMIN C) 500 MG CAPS, Take 500 mg by mouth daily., Disp: , Rfl:  .  Budeson-Glycopyrrol-Formoterol (BREZTRI AEROSPHERE) 160-9-4.8 MCG/ACT AERO, Inhale 2 puffs into the lungs 2 (two) times daily., Disp: 10.7 g, Rfl: 5 .  cholecalciferol (VITAMIN D3) 25 MCG (1000 UT) tablet, Take 1,000 Units by mouth daily., Disp: , Rfl:  .  levofloxacin (LEVAQUIN) 750 MG tablet, Take 750 mg by mouth daily., Disp: , Rfl:  .  losartan (COZAAR) 25 MG tablet, Take 1  tablet (25 mg total) by mouth daily., Disp: 30 tablet, Rfl: 2 .  omega-3 acid ethyl esters (LOVAZA) 1 g capsule, Take 1 capsule (1 g total) by mouth 2 (two) times daily., Disp: 180 capsule, Rfl: 0 .  potassium chloride SA (KLOR-CON) 20 MEQ tablet, Take 2 tablets (40 mEq total) by mouth 2 (two) times daily., Disp: 60 tablet, Rfl: 5 .  Riociguat (ADEMPAS) 1 MG TABS, Take 1 mg by mouth 3 (three) times daily., Disp: 90 tablet, Rfl: 11 .  torsemide (DEMADEX) 20 MG tablet, Take 4 tablets (80 mg total) by mouth in the morning AND 4 tablets (80 mg total) every evening., Disp: 360 tablet, Rfl: 3 .  warfarin (COUMADIN) 4 MG tablet, Take one-and-one-half (1 & 1/2)  tablets on Mondays and Thursdays. All other day, take one (1) tablet (Patient taking differently: Take 2-4 mg by mouth See admin instructions. MONDAY 2 mg.  All the other days take 4 mg), Disp: 96 tablet, Rfl: 1 .  zinc gluconate 50 MG tablet, Take 50 mg by mouth daily., Disp: , Rfl:  Past Medical History:  Diagnosis Date  . Acute on chronic respiratory failure with hypoxia ([place])   . COVID-19 virus infection   . DVT of lower extremity, bilateral ([place])   . Epistaxis   . Hypertension   . Incarcerated umbilical hernia 29/4765   Status post repair  . Lupus anticoagulant positive    Per records from [facility]  . 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 Pulmonary artery hypertension ([place]) 04/2011   Mildly elevated pulmonary artery pressure in setting of PE (from [facility])  . Pulmonary embolism associated with COVID-19 ([place])   . Subdural hematoma (HCC)    Remote episode, occurred when taking excedrin and warfarin.    Social History   Socioeconomic History  . Marital status: Divorced    Spouse name: Not on file  . Number of children: Not on file  . Years of education: Not on file  . Highest education level: Not on file  Occupational History  . Occupation: retired Pharmacist, hospital   Tobacco Use  . Smoking status: Former Smoker    Packs/day: 1.50    Years: 23.00    Pack years: 34.50    Types: Cigarettes    Quit date: 1995    Years since quitting: 26.6  . Smokeless tobacco: Never Used  Vaping Use  . Vaping Use: Never used  Substance and Sexual Activity  . Alcohol use: Never  . Drug use: Not Currently    Types: "Crack" cocaine    Comment: quit 2008  . Sexual activity: Not on file  Other Topics Concern  . Not on file  Social History Narrative  . Not on file   Social Determinants of Health   Financial Resource Strain: Low Risk   . Difficulty of Paying Living Expenses: Not very hard  Food Insecurity:   . Worried About Charity fundraiser in the Last Year:   . Arboriculturist in the Last Year:   Transportation Needs:   . Film/video editor (Medical):   Marland Kitchen Lack of Transportation (Non-Medical):   Physical Activity:   . Days of Exercise per Week:   . Minutes of Exercise per Session:   Stress:   . Feeling of Stress :   Social Connections:   . Frequency of Communication with Friends and Family:   . Frequency of Social Gatherings with Friends and Family:   . Attends Religious Services:   . Active Member of Clubs or Organizations:   . Attends Archivist Meetings:   Marland Kitchen Marital Status:    Family History  Adopted: Yes    ASSESSMENT Recent Results: The most recent result is correlated with 26 mg per week: Lab Results  Component Value Date   INR 2.2 08/08/2020   INR 1.4 (H) 07/15/2020   INR 2.1 07/11/2020    Anticoagulation Dosing: Description   Take one of your 62m strength blue warfarin tablets, by mouth, once-daily at 6PM--EXCEPT on MONDAYS--take ONLY 1/2 tablet on MONDAYS.      INR today: Therapeutic  PLAN Weekly dose was unchanged. Will evaluate potential for DDI with levofloxicin 7552mPO QD (started 15-AUG-21 to end 21-AUG-21) that can cause marked hypoprothrombinemic response. [facility]  with results to be texted or phoned to me.   Patient Instructions  Patient instructed to take medications as defined in the Anti-coagulation Track section of this encounter.  Patient instructed to take today's dose.  Patient instructed to take one of your 43m743mtrength blue warfarin tablets, by mouth, once-daily at 6PM--EXCEPT on MONDAYS--take ONLY 1/2 tablet on MONDAYS. Home Health Agency Nurse is to collect a point-of-care, fingerstick INR on Wednesday, 18-AUG-21 and text results to me at:  [id number] verbalized understanding of these instructions.    Patient  advised to contact clinic or seek medical attention if signs/symptoms of bleeding or thromboembolism occur.  Patient verbalized understanding by repeating back information and was advised to contact me if further medication-related questions arise. Patient was also provided an information handout.  Follow-up [facility] RN to check fingerstick point-of-care INR in home setting on Wednesday 18-AUG-21 and text results or call results to me at [phone] to interpret INR and adjust dose empirically (warfarin) if necessary.   Pennie Banter, PharmD, CPP  15 minutes spent face-to-face with the patient during the encounter. 50% of time spent on education, including signs/sx bleeding and clotting, as well as food and drug interactions with warfarin. 50% of time was spent on fingerprick POC INR sample collection,processing, results determination, and documentation in http://www.kim.net/.

## 2020-08-08 NOTE — Telephone Encounter (Signed)
Called and spoke with patient.  Beth,NP's recommendations given.  Understanding stated. Trelegy 200 prescription sent to requested pharmacy. Breztri d/c from med list.  Nothing further at this time.

## 2020-08-08 NOTE — Telephone Encounter (Signed)
Tried to initiate the PA for [place] but got to a question that asked if pt has tried/failed preferred inhalers of Anoro, Incruse, Striverdi, Trelegy, or [employer]. Saw that pt has tried/failed Breo.  Called pt to see if he has tried/failed any of the other preferred inhalers and he stated that he had not.  Beth, please advise on this as if we try to submit the PA, it will be denied since pt has not tried/failed at least 2 of the preferred inhalers.

## 2020-08-09 ENCOUNTER — Telehealth (HOSPITAL_COMMUNITY): Payer: Self-pay | Admitting: *Deleted

## 2020-08-09 ENCOUNTER — Encounter: Payer: Self-pay | Admitting: Internal Medicine

## 2020-08-09 NOTE — Assessment & Plan Note (Addendum)
Patient's LE edema is improving. Has 1+ edema today, but has been diureses 20 lbs (226 to 206). He is being monitored by heart failure at home program. Called and discussed with Delia Heady. The labs were faxed over. Cr stable and K 3.1. Patient on potassium supplementation and told to discuss extra dosing with heart failure at home as needed.   Plan: Continue with heart failure at home until patient reaches dry weight Torsemide 80 mg BID  Potassium 40 meq BID

## 2020-08-09 NOTE — Progress Notes (Signed)
Internal Medicine Clinic Attending  Case discussed with Dr. Court Joy  At the time of the visit.  We reviewed the resident's history and exam and pertinent patient test results.  I agree with the assessment, diagnosis, and plan of care documented in the resident's note.

## 2020-08-09 NOTE — Telephone Encounter (Signed)
Lab results received.   Potassium 3.1  Per Brittainy Simmons,PA increase Potassium to 65mq in the AM and 436m in the PM. Repeat bmet in 1 week.   Attempted to reach pt by phone no answer. Will try pt again later.

## 2020-08-09 NOTE — Assessment & Plan Note (Signed)
HPI:  Patient's BP today is 91/68  with a goal of <140/80. The patient endorses adherence to their medication regimen. He denied, chest pain, headache, visual changes, lightheadedness, and dizziness on standing.  Patients labs being monitored by HF at home program.   Plan: Stop Amlodipine 5 mg daily  Stop Losartan - HCTZ 50-12.5 mg daily  Start Losartan 25 mg daily  Repeat BMP in 3

## 2020-08-09 NOTE — Telephone Encounter (Signed)
Pt was seen yesterday by Dr Court Joy - after talking with Bonnita Nasuti no appt needed. Thanks

## 2020-08-09 NOTE — Telephone Encounter (Signed)
Pt was seen by Dr Court Joy on 08/03/20.

## 2020-08-11 MED ORDER — POTASSIUM CHLORIDE CRYS ER 20 MEQ PO TBCR: 60.0000 meq | EXTENDED_RELEASE_TABLET | Freq: Every day | ORAL | 5 refills | Status: DC

## 2020-08-11 NOTE — Addendum Note (Signed)
Addended by: Kerry Dory on: 08/11/2020 05:12 PM   Modules accepted: Orders

## 2020-08-11 NOTE — Telephone Encounter (Signed)
Pt aware of results however reports he was only taking KCL 40 meq daily per VO Brittainy Simmons, PA increase KCL to 60 MEQ daily and [facility] to repeat labs x 1 week fax [phone]

## 2020-08-12 NOTE — Progress Notes (Signed)
Internal Medicine Clinic Attending  Case discussed with Dr. Court Joy at the time of the visit.  We reviewed the resident's history and exam and pertinent patient test results.  I agree with the assessment, diagnosis, and plan of care documented in the resident's note.  Lenice Pressman, M.D., Ph.D.

## 2020-08-23 ENCOUNTER — Other Ambulatory Visit (HOSPITAL_COMMUNITY): Payer: Self-pay | Admitting: Cardiology

## 2020-09-01 ENCOUNTER — Telehealth: Payer: Self-pay | Admitting: Pharmacist

## 2020-09-01 ENCOUNTER — Telehealth: Payer: Self-pay | Admitting: *Deleted

## 2020-09-01 NOTE — Telephone Encounter (Signed)
Patient called me with results of his HHA RN venous drawn INR = 1.8 on 62m warfarin/wk. No missed doses. INCREASED to 260mwarfarin total/wk and called [facility] RN to communicate this dose increase and order repeat INR 23SEP21.

## 2020-09-01 NOTE — Telephone Encounter (Addendum)
Received faxed results from Kelby Aline with [facility]. BMP, INR, and CXR done 08/30/2020 all with flags. Dr. Elie Confer is already aware of 1.8 INR. Results handed to Dr. Shon Baton. Hubbard Hartshorn, BSN, RN-BC

## 2020-09-09 NOTE — Telephone Encounter (Signed)
Patient called me with results of his [facility] RN venous drawn INR = 1.8 on 66m warfarin/wk. No missed doses. INCREASED to 293mwarfarin total/wk and called [facility] RN to communicate this dose increase and order repeat INR 23SEP21.

## 2020-09-21 ENCOUNTER — Encounter (HOSPITAL_COMMUNITY): Payer: Self-pay | Admitting: Cardiology

## 2020-09-21 ENCOUNTER — Ambulatory Visit (INDEPENDENT_AMBULATORY_CARE_PROVIDER_SITE_OTHER): Payer: Medicare Other | Admitting: Pharmacist

## 2020-09-21 ENCOUNTER — Other Ambulatory Visit: Payer: Self-pay

## 2020-09-21 ENCOUNTER — Ambulatory Visit (HOSPITAL_COMMUNITY)
Admission: RE | Admit: 2020-09-21 | Discharge: 2020-09-21 | Disposition: A | Discharge status: Home / Self Care | Payer: Medicare Other | Source: Ambulatory Visit | Attending: Cardiology | Admitting: Cardiology

## 2020-09-21 VITALS — BP 102/64 | HR 92 | Ht 68.0 in | Wt 196.6 lb

## 2020-09-21 DIAGNOSIS — Z87820 Personal history of traumatic brain injury: Secondary | ICD-10-CM | POA: Insufficient documentation

## 2020-09-21 DIAGNOSIS — Z7901 Long term (current) use of anticoagulants: Secondary | ICD-10-CM

## 2020-09-21 DIAGNOSIS — Z9989 Dependence on other enabling machines and devices: Secondary | ICD-10-CM | POA: Diagnosis not present

## 2020-09-21 DIAGNOSIS — Z95828 Presence of other vascular implants and grafts: Secondary | ICD-10-CM | POA: Insufficient documentation

## 2020-09-21 DIAGNOSIS — Z7983 Long term (current) use of bisphosphonates: Secondary | ICD-10-CM | POA: Diagnosis not present

## 2020-09-21 DIAGNOSIS — Z86711 Personal history of pulmonary embolism: Secondary | ICD-10-CM | POA: Diagnosis not present

## 2020-09-21 DIAGNOSIS — D869 Sarcoidosis, unspecified: Secondary | ICD-10-CM | POA: Diagnosis not present

## 2020-09-21 DIAGNOSIS — I110 Hypertensive heart disease with heart failure: Secondary | ICD-10-CM | POA: Diagnosis not present

## 2020-09-21 DIAGNOSIS — Z86718 Personal history of other venous thrombosis and embolism: Secondary | ICD-10-CM | POA: Insufficient documentation

## 2020-09-21 DIAGNOSIS — Z79899 Other long term (current) drug therapy: Secondary | ICD-10-CM | POA: Insufficient documentation

## 2020-09-21 DIAGNOSIS — Z87891 Personal history of nicotine dependence: Secondary | ICD-10-CM | POA: Insufficient documentation

## 2020-09-21 DIAGNOSIS — Z9981 Dependence on supplemental oxygen: Secondary | ICD-10-CM | POA: Insufficient documentation

## 2020-09-21 DIAGNOSIS — G4733 Obstructive sleep apnea (adult) (pediatric): Secondary | ICD-10-CM | POA: Insufficient documentation

## 2020-09-21 DIAGNOSIS — I50813 Acute on chronic right heart failure: Secondary | ICD-10-CM | POA: Diagnosis present

## 2020-09-21 DIAGNOSIS — I272 Pulmonary hypertension, unspecified: Secondary | ICD-10-CM | POA: Diagnosis not present

## 2020-09-21 DIAGNOSIS — D6862 Lupus anticoagulant syndrome: Secondary | ICD-10-CM | POA: Insufficient documentation

## 2020-09-21 DIAGNOSIS — Z8616 Personal history of COVID-19: Secondary | ICD-10-CM | POA: Insufficient documentation

## 2020-09-21 DIAGNOSIS — Z23 Encounter for immunization: Secondary | ICD-10-CM

## 2020-09-21 DIAGNOSIS — I2699 Other pulmonary embolism without acute cor pulmonale: Secondary | ICD-10-CM

## 2020-09-21 DIAGNOSIS — I50812 Chronic right heart failure: Secondary | ICD-10-CM | POA: Diagnosis not present

## 2020-09-21 LAB — BASIC METABOLIC PANEL
Anion gap: 12 (ref 5–15)
BUN: 35 mg/dL — ABNORMAL HIGH (ref 8–23)
CO2: 37 mmol/L — ABNORMAL HIGH (ref 22–32)
Calcium: 9.7 mg/dL (ref 8.9–10.3)
Chloride: 85 mmol/L — ABNORMAL LOW (ref 98–111)
Creatinine, Ser: 1.2 mg/dL (ref 0.61–1.24)
GFR calc Af Amer: >60 mL/min (ref >60)
GFR calc non Af Amer: >60 mL/min (ref >60)
Glucose, Bld: 113 mg/dL — ABNORMAL HIGH (ref 70–99)
Potassium: 3 mmol/L — ABNORMAL LOW (ref 3.5–5.1)
Sodium: 134 mmol/L — ABNORMAL LOW (ref 135–145)

## 2020-09-21 LAB — POCT INR: INR: 2.4 (ref 2.0–3.0)

## 2020-09-21 LAB — BRAIN NATRIURETIC PEPTIDE: B Natriuretic Peptide: 34.7 pg/mL (ref 0.0–100.0)

## 2020-09-21 MED ORDER — TYVASO 0.6 MG/ML IN SOLN: 18.0000 ug | RESPIRATORY_TRACT | Status: DC

## 2020-09-21 NOTE — Progress Notes (Signed)
Anticoagulation Management Jesus Russell is a 69 y.o. male who reports to the clinic for monitoring of warfarin treatment.    Indication: PE, History of (resolved); DVT, History of (resolved); Long term current use of anticoagulant, warfarin for INR goal 2.0 - 3.0.   Duration: Indefinite.  Supervising physician: Dorian Pod, MD  Anticoagulation Clinic Visit History: Patient does not report signs/symptoms of bleeding or thromboembolism   Other recent changes: No diet, medications, lifestyle changes.  Anticoagulation Episode Summary    Current INR goal:  2.0-3.0  TTR:  60.9 % (10.5 mo)  Next INR check:  10/10/2020  INR from last check:    Weekly max warfarin dose:    Target end date:  Indefinite  INR check location:    Preferred lab:    Send INR reminders to:     Indications   Long term (current) use of anticoagulants [Z79.01] History of DVT (deep vein thrombosis) [Z86.718] Pulmonary embolism on long-term anticoagulation therapy (HCC) [I26.99 T45.515A] History of pulmonary embolism [Z86.711]       Comments:          No Known Allergies  Current Outpatient Medications:  .  alendronate (FOSAMAX) 70 MG tablet, Take 1 tablet (70 mg total) by mouth every 7 (seven) days. Take with a full glass of water on an empty stomach. (Patient taking differently: Take 70 mg by mouth every 7 (seven) days. Take with a full glass of water on an empty stomach. Monday), Disp: 12 tablet, Rfl: 3 .  Ascorbic Acid (VITAMIN C) 500 MG CAPS, Take 500 mg by mouth daily., Disp: , Rfl:  .  cholecalciferol (VITAMIN D3) 25 MCG (1000 UT) tablet, Take 1,000 Units by mouth daily., Disp: , Rfl:  .  Fluticasone-Umeclidin-Vilant (TRELEGY ELLIPTA) 200-62.5-25 MCG/INH AEPB, Inhale 1 puff into the lungs daily., Disp: 60 each, Rfl: 2 .  losartan (COZAAR) 25 MG tablet, Take 1 tablet (25 mg total) by mouth daily., Disp: 30 tablet, Rfl: 2 .  omega-3 acid ethyl esters (LOVAZA) 1 g capsule, Take 1 capsule (1 g total) by  mouth 2 (two) times daily., Disp: 180 capsule, Rfl: 0 .  potassium chloride SA (KLOR-CON) 20 MEQ tablet, Take 3 tablets (60 mEq total) by mouth daily., Disp: 90 tablet, Rfl: 5 .  Riociguat (ADEMPAS) 2 MG TABS, Take 2 mg by mouth 3 (three) times daily., Disp: , Rfl:  .  torsemide (DEMADEX) 20 MG tablet, Take 80 mg by mouth daily., Disp: , Rfl:  .  Treprostinil (TYVASO) 0.6 MG/ML SOLN, Inhale 18 mcg into the lungs as directed., Disp: , Rfl:  .  warfarin (COUMADIN) 4 MG tablet, TAKE 1  TABLET BY MOUTH ONCE DAILY, EXCEPT ON MONDAYS, TAKE ONLY 1/2 TABLET ON MONDAYS., Disp: 84 tablet, Rfl: 1 .  zinc gluconate 50 MG tablet, Take 50 mg by mouth daily., Disp: , Rfl:  Past Medical History:  Diagnosis Date  . Acute on chronic respiratory failure with hypoxia ([place])   . COVID-19 virus infection   . DVT of lower extremity, bilateral ([place])   . Epistaxis   . Hypertension   . Incarcerated umbilical hernia 93/9030   Status post repair  . Lupus anticoagulant positive    Per records from [facility]  . 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 Pulmonary artery hypertension ([place]) 04/2011   Mildly elevated pulmonary artery pressure in setting of PE (from [facility])  . Pulmonary  embolism associated with COVID-19 ([place])   . Subdural hematoma (HCC)    Remote episode, occurred when taking excedrin and warfarin.    Social History   Socioeconomic History  . Marital status: Divorced    Spouse name: Not on file  . Number of children: Not on file  . Years of education: Not on file  . Highest education level: Not on file  Occupational History  . Occupation: retired Pharmacist, hospital  Tobacco Use  . Smoking status: Former Smoker    Packs/day: 1.50    Years: 23.00    Pack years: 34.50    Types: Cigarettes    Quit date: 1995    Years since quitting: 26.7  . Smokeless tobacco: Never Used  Vaping Use  . Vaping Use: Never used  Substance and Sexual  Activity  . Alcohol use: Never  . Drug use: Not Currently    Types: "Crack" cocaine    Comment: quit 2008  . Sexual activity: Not on file  Other Topics Concern  . Not on file  Social History Narrative  . Not on file   Social Determinants of Health   Financial Resource Strain:   . Difficulty of Paying Living Expenses: Not on file  Food Insecurity:   . Worried About Charity fundraiser in the Last Year: Not on file  . Ran Out of Food in the Last Year: Not on file  Transportation Needs:   . Lack of Transportation (Medical): Not on file  . Lack of Transportation (Non-Medical): Not on file  Physical Activity:   . Days of Exercise per Week: Not on file  . Minutes of Exercise per Session: Not on file  Stress:   . Feeling of Stress : Not on file  Social Connections:   . Frequency of Communication with Friends and Family: Not on file  . Frequency of Social Gatherings with Friends and Family: Not on file  . Attends Religious Services: Not on file  . Active Member of Clubs or Organizations: Not on file  . Attends Archivist Meetings: Not on file  . Marital Status: Not on file   Family History  Adopted: Yes    ASSESSMENT Recent Results: The most recent result is correlated with 26 mg per week: Lab Results  Component Value Date   INR 2.4 09/21/2020   INR 2.2 08/08/2020   INR 1.4 (H) 07/15/2020    Anticoagulation Dosing: Description   Take one of your 47m strength blue warfarin tablets, by mouth, once-daily at 6PM.      INR today: Therapeutic  PLAN Weekly dose was increased by 7% to 28 mg per week  Patient Instructions  Patient instructed to take medications as defined in the Anti-coagulation Track section of this encounter.  Patient instructed to take today's dose.  Patient instructed to take one of your 478mstrength blue warfarin tablets, by mouth[facility] Patient verbalized understanding of these instructions.    Patient advised to contact  clinic or seek medical attention if signs/symptoms of bleeding or thromboembolism occur.  Patient verbalized understanding by repeating back information and was advised to contact me if further medication-related questions arise. Patient was also provided an information handout.  Follow-up Return in 19 days (on 10/10/2020) for Follow up INR.  JaPennie BanterPharmD, CPP  15 minutes spent face-to-face with the patient during the encounter. 50% of time spent on education, including signs/sx bleeding and clotting, as well as food and drug interactions with warfarin.  50% of time was spent on fingerprick POC INR sample collection,processing, results determination, and documentation in http://www.[email or web address]/.

## 2020-09-21 NOTE — Patient Instructions (Signed)
Patient instructed to take medications as defined in the Anti-coagulation Track section of this encounter.  Patient instructed to take today's dose.  Patient instructed to take one of your 75m strength blue warfarin tablets, [facility]  Patient verbalized understanding of these instructions.

## 2020-09-21 NOTE — Patient Instructions (Signed)
Start Tyvaso  -this medication will come from a specialty medication pharmacy. Please answer any [phone] number in the next few weeks, as they will need to complete enrollment  Labs today We will only contact you if something comes back abnormal or we need to make some changes. Otherwise no news is good news!   You have been referred to [facility] -they will be in touch to complete registation   Your physician recommends that you schedule a follow-up appointment in: 6 weeks  If you have any questions or concerns before your next appointment please send [place] a message through [place] or call our office at [phone].    TO LEAVE A MESSAGE FOR THE NURSE SELECT OPTION 2, PLEASE LEAVE A MESSAGE INCLUDING: . YOUR NAME . DATE OF BIRTH . CALL BACK NUMBER . REASON FOR CALL**this is important as we prioritize the call backs  YOU WILL RECEIVE A CALL BACK THE SAME DAY AS LONG AS YOU CALL BEFORE 4:00 PM

## 2020-09-22 ENCOUNTER — Telehealth (HOSPITAL_COMMUNITY): Payer: Self-pay

## 2020-09-22 DIAGNOSIS — I272 Pulmonary hypertension, unspecified: Secondary | ICD-10-CM

## 2020-09-22 MED ORDER — TORSEMIDE 20 MG PO TABS
60.0000 mg | ORAL_TABLET | Freq: Every day | ORAL | 0 refills | Status: DC
Start: 2020-09-22 — End: 2020-11-10

## 2020-09-22 MED ORDER — POTASSIUM CHLORIDE CRYS ER 20 MEQ PO TBCR: 80.0000 meq | EXTENDED_RELEASE_TABLET | Freq: Every day | ORAL | 11 refills | Status: DC

## 2020-09-22 NOTE — Progress Notes (Addendum)
PCP: Madalyn Rob, MD HF Cardiology: Dr. Aundra Dubin  69 y.o. with history of sarcoidosis, prior PE, RV failure was referred by Dr. Shearon Stalls for evaluation of CHF/pulmonary hypertension.  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  Patient was on prednisone for a period of time, this was stopped earlier this year.  He is on 2 L home oxygen.  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 He has had recurrent PEs/DVTs.  He has an IVC filter.  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  Most recent PE was in 2/21 when he had been off warfarin transiently.  Last echo in 5/21 showed EF 55-60% with D-shaped septum and severely dilated/severely dysfunctional RV.  No estimation of PA pressure was available.    RHC in 7/21 showed normal PCWP and moderate PAH with PVR 5 WU. V/Q showed perfusion defects but thought to be due to sarcoidosis.  CTA chest in 7/21 showed no acute or chronic PE, signs of chronic sarcoidosis were present.   Patient presents for followup of pulmonary hypertension/RV failure.  He is doing better on torsemide, weight down almost 30 lbs.  He is now taking torsemide 80 mg once daily.  He is on Adempas, dose up to 2 mg tid.  He feels like Adempas and diuresis have helped his breathing significantly.  He continues to wear 2 L home oxygen. Mild dyspnea walking around his house => this is improved.  Edema decreased.  No lightheadedness. He is scheduled to do a home sleep study.  He also has significant low back pain from compression fractures.  He has had kyphoplasty and is in need of additional kyphoplasty.    Labs (6/21): pro-BNP 43, K 4.6, creatinine 0.89 Labs (8/21): hgb 10.5, K 3.8, creatinine 1.28  PMH: 1. Sarcoidosis: "Burned out."  Diagnosed in 1990 with bronchoscopy/biopsy.  Treated with prednisone, now off.  - On 2 L home oxygen chronically.  2. 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 H/o DVT and PE.  Has  lupus anticoagulant.   - Most recent PE was in 2/21, he had been off warfarin transiently.  - V/Q scan in 7/21 with perfusion defects, CTA in 7/21 showed no acute or chronic thrombi, signs of sarcoidosis were present.  4. COVID-19 infection 1/21.  5. H/o compression fractures in 2/21.  - Had kyphoplasty 6. RV failure/pulmonary hypertension:  - Echo (5/21) with EF 55-60%, D-shaped interventricular septum, severely dilated RV with severe systolic dysfunction, dilated IVC.  - RHC (7/21) with mean RA 9, PA 62/21 mean 39, mean PCWP 11, CI 2.64, PVR 5 WU.  7. HTN 8. History of subdural hematoma remotely.   Social History   Socioeconomic History  . Marital status: Divorced    Spouse name: Not on file  . Number of children: Not on file  . Years of education: Not on file  . Highest education level: Not on file  Occupational History  . Occupation: retired Pharmacist, hospital  Tobacco Use  . Smoking status: Former Smoker    Packs/day: 1.50    Years: 23.00    Pack years: 34.50    Types: Cigarettes    Quit date: 1995    Years since quitting: 26.7  . Smokeless tobacco: Never Used  Vaping Use  . Vaping Use: Never used  Substance and Sexual Activity  . Alcohol use: Never  . Drug use: Not Currently    Types: "Crack" cocaine    Comment: quit 2008  . Sexual  activity: Not on file  Other Topics Concern  . Not on file  Social History Narrative  . Not on file   Social Determinants of Health   Financial Resource Strain:   . Difficulty of Paying Living Expenses: Not on file  Food Insecurity:   . Worried About Charity fundraiser in the Last Year: Not on file  . Ran Out of Food in the Last Year: Not on file  Transportation Needs:   . Lack of Transportation (Medical): Not on file  . Lack of Transportation (Non-Medical): Not on file  Physical Activity:   . Days of Exercise per Week: Not on file  . Minutes of Exercise per Session: Not on file  Stress:   . Feeling of Stress : Not on file  Social  Connections:   . Frequency of Communication with Friends and Family: Not on file  . Frequency of Social Gatherings with Friends and Family: Not on file  . Attends Religious Services: Not on file  . Active Member of Clubs or Organizations: Not on file  . Attends Archivist Meetings: Not on file  . Marital Status: Not on file  Intimate Partner Violence:   . Fear of Current or Ex-Partner: Not on file  . Emotionally Abused: Not on file  . Physically Abused: Not on file  . Sexually Abused: Not on file   Family History  Adopted: Yes   ROS: All systems reviewed and negative except as per HPI.   Current Outpatient Medications  Medication Sig Dispense Refill  . alendronate (FOSAMAX) 70 MG tablet Take 1 tablet (70 mg total) by mouth every 7 (seven) days. Take with a full glass of water on an empty stomach. (Patient taking differently: Take 70 mg by mouth every 7 (seven) days. Take with a full glass of water on an empty stomach. Monday) 12 tablet 3  . Ascorbic Acid (VITAMIN C) 500 MG CAPS Take 500 mg by mouth daily.    . cholecalciferol (VITAMIN D3) 25 MCG (1000 UT) tablet Take 1,000 Units by mouth daily.    . Fluticasone-Umeclidin-Vilant (TRELEGY ELLIPTA) 200-62.5-25 MCG/INH AEPB Inhale 1 puff into the lungs daily. 60 each 2  . losartan (COZAAR) 25 MG tablet Take 1 tablet (25 mg total) by mouth daily. 30 tablet 2  . omega-3 acid ethyl esters (LOVAZA) 1 g capsule Take 1 capsule (1 g total) by mouth 2 (two) times daily. 180 capsule 0  . Riociguat (ADEMPAS) 2 MG TABS Take 2 mg by mouth 3 (three) times daily.    Marland Kitchen warfarin (COUMADIN) 4 MG tablet TAKE 1  TABLET BY MOUTH ONCE DAILY, EXCEPT ON MONDAYS, TAKE ONLY 1/2 TABLET ON MONDAYS. 84 tablet 1  . zinc gluconate 50 MG tablet Take 50 mg by mouth daily.    . potassium chloride SA (KLOR-CON) 20 MEQ tablet Take 4 tablets (80 mEq total) by mouth daily. 120 tablet 11  . torsemide (DEMADEX) 20 MG tablet Take 3 tablets (60 mg total) by mouth daily.  90 tablet 0  . Treprostinil (TYVASO) 0.6 MG/ML SOLN Inhale 18 mcg into the lungs as directed.     No current facility-administered medications for this encounter.   BP 102/64 (BP Location: Left Arm, Patient Position: Sitting, Cuff Size: Normal)   Pulse 92   Ht _0  (1.727 m)   Wt 89.2 kg (196 lb 9.6 oz)   SpO2 95% Comment: 2 l oxygen  BMI 29.89 kg/m  General: NAD Neck: JVP 8 cm, no  thyromegaly or thyroid nodule.  Lungs: Slight crackles at bases.  CV: Nondisplaced PMI.  Heart regular S1/S2 with widely split S2, no S3/S4, no murmur.  Trace ankle edema.  No carotid bruit.  Normal pedal pulses.  Abdomen: Soft, nontender, no hepatosplenomegaly, no distention.  Skin: Intact without lesions or rashes.  Neurologic: Alert and oriented x 3.  Psych: Normal affect. Extremities: No clubbing or cyanosis.  HEENT: Normal.   Assessment/Plan: 1. RV failure, pulmonary hypertension: Echo in 5/21 showed EF 55-60%, D-shaped septum with severe RV dilation/severe RV dysfunction.  [place] in 7/21 showed mildly elevated RA pressure, moderate PAH with PVR 5 WU.  V/Q scan showed perfusion defects, but CTA chest in 7/21 showed no acute or chronic PE, V/Q findings were likely due to chronic sarcoidosis changes in lungs.  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  He is minimal volume overloaded now, he has lost 30 lbs on torsemide.  - Continue torsemide 80 mg daily. BMET/BNP today.  - Unable to do 6 minute walk today as we do not have portable oxygen tank right now.  Will need to do at followup.  - Continue Adempas, he feels like this has helped.  - Add Tyvaso.  - Pulmonary rehab.  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  Has positive lupus anticoagulant.  - Continue warfarin, should have Lovenox bridge if stops warfarin (had PE when off warfarin transiently in 2/21).  3. Sarcoidosis: History of "burned out" pulmonary sarcoidosis.  This may be the cause of his pulmonary  hypertension.  No definitive evidence for cardiac sarcoidosis, but could arrange for cardiac MRI in the future to investigate. He is no longer on prednisone.  - Continue 2 L home oxygen.  4. HTN: Continue current regimen.  5. OSA: Use CPAP.   Loralie Champagne 09/22/2020

## 2020-09-22 NOTE — Telephone Encounter (Signed)
Patient advised and verbalized understanding.lab appt scheduled,lab order entered,med list updated to reflect changes.

## 2020-09-22 NOTE — Telephone Encounter (Signed)
-----  Message from [name], MD sent at 09/21/2020  3:44 PM EDT ----- Decrease torsemide to 60 mg daily.  Increase total daily KCl by 20 mEq.  Repeat BMET 1 week.

## 2020-09-22 NOTE — Progress Notes (Signed)
DOS 09/21/20:  Patient's case reviewed and I agree with Dr. Gladstone Pih treatment as he has outlined.

## 2020-09-23 LAB — PROTIME-INR

## 2020-09-23 NOTE — Addendum Note (Signed)
Encounter addended by: [name], MD on: 09/23/2020 2:02 PM  Actions taken: Clinical Note Signed

## 2020-09-26 ENCOUNTER — Encounter (HOSPITAL_COMMUNITY): Payer: Self-pay | Admitting: *Deleted

## 2020-09-26 ENCOUNTER — Telehealth (HOSPITAL_COMMUNITY): Payer: Self-pay | Admitting: Pharmacist

## 2020-09-26 NOTE — Telephone Encounter (Signed)
Sent in Tyvaso patient enrollment application to [facility].    Application pending, will continue to follow.   Audry Riles, PharmD, BCPS, BCCP, CPP Heart Failure Clinic Pharmacist [phone]

## 2020-09-26 NOTE — Progress Notes (Signed)
Received referral from Dr. Aundra Dubin for this pt to participate in pulmonary rehab with the the diagnosis of  Pulmonary hypertension. Clinical review of pt follow up appt on 9/29 Heart Failure clinic office note.  Pt with Covid Risk Score - 6. Pt appropriate for scheduling for Pulmonary rehab.  Will forward to support staff for scheduling and verification of insurance eligibility/benefits with pt consent. Cherre Huger, BSN Cardiac and Training and development officer

## 2020-09-27 LAB — PROTIME-INR

## 2020-09-28 ENCOUNTER — Encounter (HOSPITAL_COMMUNITY): Payer: Self-pay

## 2020-09-28 ENCOUNTER — Telehealth (HOSPITAL_COMMUNITY): Payer: Self-pay

## 2020-09-28 NOTE — Telephone Encounter (Signed)
Pt insurance is active and benefits verified through Medicare a/b Co-pay 0, DED $203/$203 met, out of pocket 0/0 met, co-insurance 20%. no pre-authorization required.  2ndary insurance is active and benefits verified through [employer]. Co-pay 0, DED 0/0 met, out of pocket 0/0 met, co-insurance 0%. No pre-authorization required. 

## 2020-09-29 ENCOUNTER — Ambulatory Visit (HOSPITAL_COMMUNITY)
Admission: RE | Admit: 2020-09-29 | Discharge: 2020-09-29 | Disposition: A | Discharge status: Home / Self Care | Payer: Medicare Other | Source: Ambulatory Visit | Attending: Internal Medicine | Admitting: Internal Medicine

## 2020-09-29 ENCOUNTER — Telehealth (HOSPITAL_COMMUNITY): Payer: Self-pay

## 2020-09-29 ENCOUNTER — Other Ambulatory Visit: Payer: Self-pay

## 2020-09-29 DIAGNOSIS — I272 Pulmonary hypertension, unspecified: Secondary | ICD-10-CM | POA: Insufficient documentation

## 2020-09-29 DIAGNOSIS — I50812 Chronic right heart failure: Secondary | ICD-10-CM

## 2020-09-29 LAB — BASIC METABOLIC PANEL
Anion gap: 15 (ref 5–15)
BUN: 29 mg/dL — ABNORMAL HIGH (ref 8–23)
CO2: 32 mmol/L (ref 22–32)
Calcium: 9.9 mg/dL (ref 8.9–10.3)
Chloride: 89 mmol/L — ABNORMAL LOW (ref 98–111)
Creatinine, Ser: 1.37 mg/dL — ABNORMAL HIGH (ref 0.61–1.24)
GFR calc non Af Amer: 53 mL/min — ABNORMAL LOW (ref >60)
Glucose, Bld: 103 mg/dL — ABNORMAL HIGH (ref 70–99)
Potassium: 3.3 mmol/L — ABNORMAL LOW (ref 3.5–5.1)
Sodium: 136 mmol/L (ref 135–145)

## 2020-09-29 NOTE — Telephone Encounter (Signed)
-----  Message from [name], MD sent at 09/29/2020  4:09 PM EDT ----- Increase total daily K by 20 mEq.  BMET in 10 days.

## 2020-09-29 NOTE — Telephone Encounter (Signed)
Malena Edman, RN  09/29/2020 5:03 PM EDT Back to Top    Patient advised and verbalized understanding. Pt stated that they were only taking 68mq every day instead of 873m. Pt will start taking 8015mdaily. Lab appt schedule. Lab orders placed

## 2020-10-05 ENCOUNTER — Other Ambulatory Visit: Payer: Self-pay

## 2020-10-05 ENCOUNTER — Ambulatory Visit: Payer: Medicare Other

## 2020-10-05 DIAGNOSIS — G4733 Obstructive sleep apnea (adult) (pediatric): Secondary | ICD-10-CM | POA: Diagnosis not present

## 2020-10-10 ENCOUNTER — Other Ambulatory Visit: Payer: Self-pay | Admitting: Primary Care

## 2020-10-10 ENCOUNTER — Ambulatory Visit: Payer: Medicare Other

## 2020-10-10 ENCOUNTER — Telehealth: Payer: Self-pay | Admitting: Pulmonary Disease

## 2020-10-10 DIAGNOSIS — G4733 Obstructive sleep apnea (adult) (pediatric): Secondary | ICD-10-CM | POA: Diagnosis not present

## 2020-10-10 DIAGNOSIS — J9611 Chronic respiratory failure with hypoxia: Secondary | ICD-10-CM

## 2020-10-10 NOTE — Telephone Encounter (Signed)
Please confer with Geraldo Pitter prior to calling patient with result  Is patient currently on oxygen supplementation? Complex patient to set up, as ideally, patient should be titrated in lab.    Sleep study result  Date of study: 10/05/2020  Impression: Severe obstructive sleep apnea  moderately severe oxygen desaturations Pulmonary hypertension with underlying hypoxemia unrelated to sleep disordered breathing   Recommendation: Ideally, will recommend in lab titration study for the management of obstructive sleep apnea with hypoxemia unrelated to sleep disordered breathing, documentation of inability to sleep in the lab.  Auto CPAP 10-20 with heated humidification with oxygen piped into the system. Will require oximetry soon after set up of the system to ensure adequate saturations are maintained  Close clinical follow-up with compliance monitoring to optimize therapeutic efficiency  Weight loss measures encouraged  Follow-up as previously scheduled

## 2020-10-10 NOTE — Telephone Encounter (Signed)
Yes, thanks. I believe he wasn't able to do in-lab sleep study in the past d/t back pain.   Colletta [place] can you start CPAP auto 10-20cm h20 (see Dr. Colman Cater orders above)and order ONO in 2 weeks on CPAP. Needs follow-up in 4 weeks

## 2020-10-10 NOTE — Telephone Encounter (Signed)
Yes, he wears 2L oxygen on exertion and at night. He did not wear oxygen during sleep study.

## 2020-10-10 NOTE — Telephone Encounter (Signed)
Is this patient currently on oxygen Dr.olalere wants to set up cpap titrating study

## 2020-10-10 NOTE — Telephone Encounter (Signed)
Pt is currently Yes, he wears 2L oxygen on exertion and at night. He did not wear oxygen during sleep study do you want me to call with recommendations and set up for cpap titration study

## 2020-10-10 NOTE — Telephone Encounter (Signed)
Pt was call with dr Ander Slade results and recommendations has agreed to cpap therapy and ono has f/u scheduled for 11/11/2020

## 2020-10-10 NOTE — Telephone Encounter (Signed)
There is always some difficulty with setting people up if they are not titrated in lab--when they have sleep apnea and oxygen requirement   CPAP may be set up with 2 L of oxygen piped into the system Oximetry to be repeated after about a week of set up  Needs frequent follow-up to ascertain adequate treatment

## 2020-10-10 NOTE — Addendum Note (Signed)
Addended by: Satira Sark D on: 10/10/2020 01:55 PM   Modules accepted: Orders

## 2020-10-11 ENCOUNTER — Ambulatory Visit (INDEPENDENT_AMBULATORY_CARE_PROVIDER_SITE_OTHER): Payer: Medicare Other | Admitting: Pharmacist

## 2020-10-11 ENCOUNTER — Other Ambulatory Visit: Payer: Self-pay

## 2020-10-11 ENCOUNTER — Ambulatory Visit (HOSPITAL_COMMUNITY)
Admission: RE | Admit: 2020-10-11 | Discharge: 2020-10-11 | Disposition: A | Discharge status: Home / Self Care | Payer: Medicare Other | Source: Ambulatory Visit | Attending: Adult Health | Admitting: Adult Health

## 2020-10-11 DIAGNOSIS — T45515A Adverse effect of anticoagulants, initial encounter: Secondary | ICD-10-CM | POA: Diagnosis not present

## 2020-10-11 DIAGNOSIS — I50812 Chronic right heart failure: Secondary | ICD-10-CM

## 2020-10-11 DIAGNOSIS — I2699 Other pulmonary embolism without acute cor pulmonale: Secondary | ICD-10-CM

## 2020-10-11 DIAGNOSIS — Z86711 Personal history of pulmonary embolism: Secondary | ICD-10-CM

## 2020-10-11 DIAGNOSIS — Z86718 Personal history of other venous thrombosis and embolism: Secondary | ICD-10-CM | POA: Diagnosis not present

## 2020-10-11 DIAGNOSIS — Z7901 Long term (current) use of anticoagulants: Secondary | ICD-10-CM

## 2020-10-11 LAB — BASIC METABOLIC PANEL
Anion gap: 12 (ref 5–15)
BUN: 24 mg/dL — ABNORMAL HIGH (ref 8–23)
CO2: 32 mmol/L (ref 22–32)
Calcium: 9.6 mg/dL (ref 8.9–10.3)
Chloride: 92 mmol/L — ABNORMAL LOW (ref 98–111)
Creatinine, Ser: 1.33 mg/dL — ABNORMAL HIGH (ref 0.61–1.24)
GFR, Estimated: 55 mL/min — ABNORMAL LOW (ref >60)
Glucose, Bld: 92 mg/dL (ref 70–99)
Potassium: 2.8 mmol/L — ABNORMAL LOW (ref 3.5–5.1)
Sodium: 136 mmol/L (ref 135–145)

## 2020-10-11 LAB — POCT INR: INR: 3.1 — AB (ref 2.0–3.0)

## 2020-10-11 NOTE — Progress Notes (Signed)
Anticoagulation Management Jesus Russell is a 69 y.o. male who reports to the clinic for monitoring of warfarin treatment.    Indication: DVT, history of; PE, history of; Long term current use of anticoagulant, warfarin to maintain INR 2.0 - 3.0. Duration: indefinite Supervising physician: Lalla Brothers  Anticoagulation Clinic Visit History: Patient does not report signs/symptoms of bleeding or thromboembolism  Other recent changes: No diet, medications, lifestyle changes endorsed by the patient at this visit.  Anticoagulation Episode Summary    Current INR goal:  2.0-3.0  TTR:  62.4 % (11.2 mo)  Next INR check:  11/01/2020  INR from last check:  3.1 (10/11/2020)  Weekly max warfarin dose:    Target end date:  Indefinite  INR check location:    Preferred lab:    Send INR reminders to:     Indications   Long term (current) use of anticoagulants [Z79.01] History of DVT (deep vein thrombosis) [Z86.718] Pulmonary embolism on long-term anticoagulation therapy (HCC) [I26.99 T45.515A] History of pulmonary embolism [Z86.711]       Comments:          No Known Allergies  Current Outpatient Medications:  .  alendronate (FOSAMAX) 70 MG tablet, Take 1 tablet (70 mg total) by mouth every 7 (seven) days. Take with a full glass of water on an empty stomach. (Patient taking differently: Take 70 mg by mouth every 7 (seven) days. Take with a full glass of water on an empty stomach. Monday), Disp: 12 tablet, Rfl: 3 .  Ascorbic Acid (VITAMIN C) 500 MG CAPS, Take 500 mg by mouth daily., Disp: , Rfl:  .  cholecalciferol (VITAMIN D3) 25 MCG (1000 UT) tablet, Take 1,000 Units by mouth daily., Disp: , Rfl:  .  Fluticasone-Umeclidin-Vilant (TRELEGY ELLIPTA) 200-62.5-25 MCG/INH AEPB, Inhale 1 puff into the lungs daily., Disp: 60 each, Rfl: 2 .  losartan (COZAAR) 25 MG tablet, Take 1 tablet (25 mg total) by mouth daily., Disp: 30 tablet, Rfl: 2 .  omega-3 acid ethyl esters (LOVAZA) 1 g capsule, Take 1  capsule (1 g total) by mouth 2 (two) times daily., Disp: 180 capsule, Rfl: 0 .  potassium chloride SA (KLOR-CON) 20 MEQ tablet, Take 4 tablets (80 mEq total) by mouth daily., Disp: 120 tablet, Rfl: 11 .  Riociguat (ADEMPAS) 2 MG TABS, Take 2 mg by mouth 3 (three) times daily., Disp: , Rfl:  .  torsemide (DEMADEX) 20 MG tablet, Take 3 tablets (60 mg total) by mouth daily., Disp: 90 tablet, Rfl: 0 .  Treprostinil (TYVASO) 0.6 MG/ML SOLN, Inhale 18 mcg into the lungs as directed., Disp: , Rfl:  .  warfarin (COUMADIN) 4 MG tablet, TAKE 1  TABLET BY MOUTH ONCE DAILY, EXCEPT ON MONDAYS, TAKE ONLY 1/2 TABLET ON MONDAYS., Disp: 84 tablet, Rfl: 1 .  zinc gluconate 50 MG tablet, Take 50 mg by mouth daily., Disp: , Rfl:  Past Medical History:  Diagnosis Date  . Acute on chronic respiratory failure with hypoxia ([place])   . COVID-19 virus infection   . DVT of lower extremity, bilateral ([place])   . Epistaxis   . Hypertension   . Incarcerated umbilical hernia 13/2440   Status post repair  . Lupus anticoagulant positive    Per records from [facility]  . 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 Pulmonary artery hypertension ([place]) 04/2011   Mildly elevated pulmonary artery pressure in setting of PE (from  [facility])  . Pulmonary embolism associated with COVID-19 ([place])   . Subdural hematoma (H[facility])    Remote episode, occurred when taking excedrin and warfarin.    Social History   Socioeconomic History  . Marital status: Divorced    Spouse name: Not on file  . Number of children: Not on file  . Years of education: Not on file  . Highest education level: Not on file  Occupational History  . Occupation: retired Pharmacist, hospital  Tobacco Use  . Smoking status: Former Smoker    Packs/day: 1.50    Years: 23.00    Pack years: 34.50    Types: Cigarettes    Quit date: 1995    Years since quitting: 26.8  . Smokeless tobacco: Never Used  Vaping Use    . Vaping Use: Never used  Substance and Sexual Activity  . Alcohol use: Never  . Drug use: Not Currently    Types: "Crack" cocaine    Comment: quit 2008  . Sexual activity: Not on file  Other Topics Concern  . Not on file  Social History Narrative  . Not on file   Social Determinants of Health   Financial Resource Strain:   . Difficulty of Paying Living Expenses: Not on file  Food Insecurity:   . Worried About Charity fundraiser in the Last Year: Not on file  . Ran Out of Food in the Last Year: Not on file  Transportation Needs:   . Lack of Transportation (Medical): Not on file  . Lack of Transportation (Non-Medical): Not on file  Physical Activity:   . Days of Exercise per Week: Not on file  . Minutes of Exercise per Session: Not on file  Stress:   . Feeling of Stress : Not on file  Social Connections:   . Frequency of Communication with Friends and Family: Not on file  . Frequency of Social Gatherings with Friends and Family: Not on file  . Attends Religious Services: Not on file  . Active Member of Clubs or Organizations: Not on file  . Attends Archivist Meetings: Not on file  . Marital Status: Not on file   Family History  Adopted: Yes    ASSESSMENT Recent Results: The most recent result is correlated with 28 mg per week: Lab Results  Component Value Date   INR 3.1 (A) 10/11/2020   INR 2.4 09/21/2020   INR 2.2 08/08/2020    Anticoagulation Dosing: Description   Take one of your 54m strength blue warfarin tablets, by mouth, once-daily at 6PM-EXCEPT on Tuesdays, take only 1/2 tablet on Tuesdays.       INR today: Supratherapeutic  PLAN Weekly dose was decreased by 7% to 26 mg per week  Patient Instructions  Patient instructed to take medications as defined in the Anti-coagulation Track section of this encounter.  Patient instructed to take today's dose.  Patient instructed to take one of your 433mstrength blue warfarin tablets, by mouth,  once-daily at 6PM-EXCEPT on Tuesdays, take only 1/2 tablet on Tuesdays. Patient verbalized understanding of these instructions.    Patient advised to contact clinic or seek medical attention if signs/symptoms of bleeding or thromboembolism occur.  Patient verbalized understanding by repeating back information and was advised to contact me if further medication-related questions arise. Patient was also provided an information handout.  Follow-up Return in 3 weeks (on 11/01/2020) for Follow up INR.  JaPennie BanterPharmD, CPP  15 minutes spent face-to-face with the patient during  the encounter. 50% of time spent on education, including signs/sx bleeding and clotting, as well as food and drug interactions with warfarin. 50% of time was spent on fingerprick POC INR sample collection,processing, results determination, and documentation in [email or web address]/.

## 2020-10-11 NOTE — Patient Instructions (Signed)
Patient instructed to take medications as defined in the Anti-coagulation Track section of this encounter.  Patient instructed to take today's dose.  Patient instructed to take one of your 21m strength blue warfarin tablets, by mouth, once-daily at 6PM-EXCEPT on Tuesdays, take only 1/2 tablet on Tuesdays. Patient verbalized understanding of these instructions.

## 2020-10-11 NOTE — Progress Notes (Signed)
INTERNAL MEDICINE TEACHING ATTENDING ADDENDUM ° °I agree with pharmacy recommendations as outlined in their note.  ° °-Doree Kuehne MD ° °

## 2020-10-14 ENCOUNTER — Telehealth (HOSPITAL_COMMUNITY): Payer: Self-pay

## 2020-10-14 DIAGNOSIS — I50812 Chronic right heart failure: Secondary | ICD-10-CM

## 2020-10-14 NOTE — Telephone Encounter (Signed)
While speaking with patient on the phone while giving him his lab results he stated that the pharmacy wouldn't cover his Tyvaso. Is there anything we can do to assist him with this?

## 2020-10-14 NOTE — Telephone Encounter (Signed)
Patient advised and verbalized understanding. Lab appt scheduled,lab order entered.  Orders Placed This Encounter  Procedures  . Basic Metabolic Panel (BMET)    Standing Status:   Future    Standing Expiration Date:   10/14/2021    Order Specific Question:   Release to patient    Answer:   Immediate

## 2020-10-14 NOTE — Telephone Encounter (Signed)
-----  Message from [name], MD sent at 10/13/2020  6:41 PM EDT ----- Take K as he was supposed to take it and repeat BMET Monday .

## 2020-10-15 ENCOUNTER — Other Ambulatory Visit: Payer: Self-pay | Admitting: Internal Medicine

## 2020-10-17 ENCOUNTER — Other Ambulatory Visit: Payer: Self-pay

## 2020-10-17 ENCOUNTER — Ambulatory Visit (HOSPITAL_COMMUNITY)
Admission: RE | Admit: 2020-10-17 | Discharge: 2020-10-17 | Disposition: A | Discharge status: Home / Self Care | Payer: Medicare Other | Source: Ambulatory Visit | Attending: Internal Medicine | Admitting: Internal Medicine

## 2020-10-17 DIAGNOSIS — I50812 Chronic right heart failure: Secondary | ICD-10-CM | POA: Diagnosis present

## 2020-10-17 LAB — BASIC METABOLIC PANEL
Anion gap: 8 (ref 5–15)
BUN: 15 mg/dL (ref 8–23)
CO2: 28 mmol/L (ref 22–32)
Calcium: 9.7 mg/dL (ref 8.9–10.3)
Chloride: 101 mmol/L (ref 98–111)
Creatinine, Ser: 1.1 mg/dL (ref 0.61–1.24)
GFR, Estimated: >60 mL/min (ref >60)
Glucose, Bld: 86 mg/dL (ref 70–99)
Potassium: 5.4 mmol/L — ABNORMAL HIGH (ref 3.5–5.1)
Sodium: 137 mmol/L (ref 135–145)

## 2020-10-17 NOTE — Telephone Encounter (Signed)
Ok perfect. Thanks

## 2020-10-18 ENCOUNTER — Telehealth (HOSPITAL_COMMUNITY): Payer: Self-pay | Admitting: Pharmacy Technician

## 2020-10-18 NOTE — Telephone Encounter (Signed)
Spoke with [facility] today and started a referral for the patient to receive assistance for Tyvaso. They are going to call the patient today to get approval from him to start the assistance process.  Called and updated the patient.  Will follow up.

## 2020-10-20 ENCOUNTER — Telehealth: Payer: Self-pay | Admitting: Primary Care

## 2020-10-20 NOTE — Telephone Encounter (Signed)
Spoke to patient, who stated that he had an appt with [facility] to go pickup ONO, however when he got to his appointment they told him he could not have ONO until after cpap setup.  Per 10/10/2020 phone note, patient is to have a ONO 2 weeks after cpap setup.  I have made patient aware of this information. He voiced his understanding and had no further questions.  Nothing further needed.

## 2020-10-20 NOTE — Telephone Encounter (Signed)
lmtcb for pt.  

## 2020-10-24 ENCOUNTER — Other Ambulatory Visit: Payer: Self-pay

## 2020-10-24 ENCOUNTER — Ambulatory Visit (HOSPITAL_COMMUNITY)
Admission: RE | Admit: 2020-10-24 | Discharge: 2020-10-24 | Disposition: A | Discharge status: Home / Self Care | Payer: Medicare Other | Source: Ambulatory Visit | Attending: Internal Medicine | Admitting: Internal Medicine

## 2020-10-24 ENCOUNTER — Encounter (HOSPITAL_COMMUNITY): Payer: Self-pay

## 2020-10-24 ENCOUNTER — Telehealth (HOSPITAL_COMMUNITY): Payer: Self-pay | Admitting: Pharmacist

## 2020-10-24 DIAGNOSIS — I5089 Other heart failure: Secondary | ICD-10-CM | POA: Diagnosis present

## 2020-10-24 NOTE — Telephone Encounter (Signed)
Sent in [employer] application to [facility] for Tyvaso.   Application pending, will continue to follow.  Audry Riles, PharmD, BCPS, BCCP, CPP Heart Failure Clinic Pharmacist [phone]

## 2020-10-25 NOTE — Telephone Encounter (Signed)
Advanced Heart Failure Patient Advocate Encounter   Patient was approved to receive Tyvaso from [facility].  Patient ID: [id number] Effective dates: 10/24/20 through 12/23/20  The patient will get the medication shipped from [place] [phone]. The pharmacy was sent the prescription today, they will reach out to the patient and schedule a shipment. They will send out a renewal application for 0315 this month.   Spoke with the patient regarding approval. Advised him to call me next week if he has not heard from the specialty pharmacy.  Charlann Boxer, CPhT

## 2020-11-01 ENCOUNTER — Telehealth (HOSPITAL_COMMUNITY): Payer: Self-pay

## 2020-11-01 ENCOUNTER — Ambulatory Visit (INDEPENDENT_AMBULATORY_CARE_PROVIDER_SITE_OTHER): Payer: Medicare Other | Admitting: Pharmacist

## 2020-11-01 DIAGNOSIS — I2699 Other pulmonary embolism without acute cor pulmonale: Secondary | ICD-10-CM

## 2020-11-01 DIAGNOSIS — Z86718 Personal history of other venous thrombosis and embolism: Secondary | ICD-10-CM

## 2020-11-01 DIAGNOSIS — T45515A Adverse effect of anticoagulants, initial encounter: Secondary | ICD-10-CM

## 2020-11-01 DIAGNOSIS — Z86711 Personal history of pulmonary embolism: Secondary | ICD-10-CM

## 2020-11-01 DIAGNOSIS — Z7901 Long term (current) use of anticoagulants: Secondary | ICD-10-CM | POA: Diagnosis not present

## 2020-11-01 LAB — POCT INR: INR: 1.5 — AB (ref 2.0–3.0)

## 2020-11-01 NOTE — Progress Notes (Signed)
Anticoagulation Management Jesus Russell is a 69 y.o. male who reports to the clinic for monitoring of warfarin treatment.    Indication: DVT, History of (resolved); PE (History of [resolved]); long term current use of anticoagulant.  Duration: indefinite Supervising physician: Lenice Pressman, MD, PhD  Anticoagulation Clinic Visit History: Patient does not report signs/symptoms of bleeding or thromboembolism  Other recent changes: No diet, medications, lifestyle except as noted in patient findings.  Anticoagulation Episode Summary    Current INR goal:  2.0-3.0  TTR:  62.4 % (11.9 mo)  Next INR check:  11/14/2020  INR from last check:  1.5 (11/01/2020)  Weekly max warfarin dose:    Target end date:  Indefinite  INR check location:    Preferred lab:    Send INR reminders to:     Indications   Long term (current) use of anticoagulants [Z79.01] History of DVT (deep vein thrombosis) [Z86.718] Pulmonary embolism on long-term anticoagulation therapy (HCC) [I26.99 T45.515A] History of pulmonary embolism [Z86.711]       Comments:          No Known Allergies  Current Outpatient Medications:  .  alendronate (FOSAMAX) 70 MG tablet, Take 1 tablet (70 mg total) by mouth every 7 (seven) days. Take with a full glass of water on an empty stomach. (Patient taking differently: Take 70 mg by mouth every 7 (seven) days. Take with a full glass of water on an empty stomach. Monday), Disp: 12 tablet, Rfl: 3 .  Ascorbic Acid (VITAMIN C) 500 MG CAPS, Take 500 mg by mouth daily., Disp: , Rfl:  .  cholecalciferol (VITAMIN D3) 25 MCG (1000 UT) tablet, Take 1,000 Units by mouth daily., Disp: , Rfl:  .  Fluticasone-Umeclidin-Vilant (TRELEGY ELLIPTA) 200-62.5-25 MCG/INH AEPB, Inhale 1 puff into the lungs daily., Disp: 60 each, Rfl: 2 .  losartan (COZAAR) 25 MG tablet, Take 1 tablet (25 mg total) by mouth daily., Disp: 30 tablet, Rfl: 2 .  omega-3 acid ethyl esters (LOVAZA) 1 g capsule, TAKE 1 CAPSULE(1  GRAM TOTAL) BY MOUTH TWICE DAILY, Disp: 180 capsule, Rfl: 0 .  potassium chloride SA (KLOR-CON) 20 MEQ tablet, Take 4 tablets (80 mEq total) by mouth daily., Disp: 120 tablet, Rfl: 11 .  Riociguat (ADEMPAS) 2 MG TABS, Take 2 mg by mouth 3 (three) times daily., Disp: , Rfl:  .  torsemide (DEMADEX) 20 MG tablet, Take 3 tablets (60 mg total) by mouth daily., Disp: 90 tablet, Rfl: 0 .  Treprostinil (TYVASO) 0.6 MG/ML SOLN, Inhale 18 mcg into the lungs as directed., Disp: , Rfl:  .  warfarin (COUMADIN) 4 MG tablet, TAKE 1  TABLET BY MOUTH ONCE DAILY, EXCEPT ON MONDAYS, TAKE ONLY 1/2 TABLET ON MONDAYS., Disp: 84 tablet, Rfl: 1 .  zinc gluconate 50 MG tablet, Take 50 mg by mouth daily., Disp: , Rfl:  Past Medical History:  Diagnosis Date  . Acute on chronic respiratory failure with hypoxia ([place])   . COVID-19 virus infection   . DVT of lower extremity, bilateral ([place])   . Epistaxis   . Hypertension   . Incarcerated umbilical hernia [id number]   Status post repair  . Lupus anticoagulant positive    Per records from [facility]  . 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 Pulmonary artery hypertension ([place]) 04/2011   Mildly elevated pulmonary artery pressure in setting of PE (from [facility])  . Pulmonary embolism associated  with COVID-19 ([place])   . Subdural hematoma (H[facility])    Remote episode, occurred when taking excedrin and warfarin.    Social History   Socioeconomic History  . Marital status: Divorced    Spouse name: Not on file  . Number of children: Not on file  . Years of education: Not on file  . Highest education level: Not on file  Occupational History  . Occupation: retired Pharmacist, hospital  Tobacco Use  . Smoking status: Former Smoker    Packs/day: 1.50    Years: 23.00    Pack years: 34.50    Types: Cigarettes    Quit date: 1995    Years since quitting: 26.8  . Smokeless tobacco: Never Used  Vaping Use  . Vaping Use:  Never used  Substance and Sexual Activity  . Alcohol use: Never  . Drug use: Not Currently    Types: "Crack" cocaine    Comment: quit 2008  . Sexual activity: Not on file  Other Topics Concern  . Not on file  Social History Narrative  . Not on file   Social Determinants of Health   Financial Resource Strain:   . Difficulty of Paying Living Expenses: Not on file  Food Insecurity:   . Worried About Charity fundraiser in the Last Year: Not on file  . Ran Out of Food in the Last Year: Not on file  Transportation Needs:   . Lack of Transportation (Medical): Not on file  . Lack of Transportation (Non-Medical): Not on file  Physical Activity:   . Days of Exercise per Week: Not on file  . Minutes of Exercise per Session: Not on file  Stress:   . Feeling of Stress : Not on file  Social Connections:   . Frequency of Communication with Friends and Family: Not on file  . Frequency of Social Gatherings with Friends and Family: Not on file  . Attends Religious Services: Not on file  . Active Member of Clubs or Organizations: Not on file  . Attends Archivist Meetings: Not on file  . Marital Status: Not on file   Family History  Adopted: Yes    ASSESSMENT Recent Results: The most recent result is correlated with 26 mg per week: Lab Results  Component Value Date   INR 1.5 (A) 11/01/2020   INR 3.1 (A) 10/11/2020   INR 2.4 09/21/2020    Anticoagulation Dosing: Description   Take one of your 67m strength blue warfarin tablets, by mouth, once-daily at 6PM-EXCEPT on Tuesdays, take 1&1/2 tablets on Tuesdays.       INR today: Subtherapeutic  PLAN Weekly dose was increased by 15% to 30 mg per week  Patient Instructions  Patient instructed to take medications as defined in the Anti-coagulation Track section of this encounter.  Patient instructed to take today's dose.  Patient instructed to take  one of your 417mstrength blue warfarin tablets, by mouth, once-daily at  6PM-EXCEPT on Tuesdays, take 1&1/2 tablets on Tuesdays. Patient verbalized understanding of these instructions.    Patient advised to contact clinic or seek medical attention if signs/symptoms of bleeding or thromboembolism occur.  Patient verbalized understanding by repeating back information and was advised to contact me if further medication-related questions arise. Patient was also provided an information handout.  Follow-up Return in 13 days (on 11/14/2020) for Follow up INR.  JaPennie BanterPharmD, CPP  15 minutes spent face-to-face with the patient during the encounter. 50% of time spent on education,  including signs/sx bleeding and clotting, as well as food and drug interactions with warfarin. 50% of time was spent on fingerprick POC INR sample collection,processing, results determination, and documentation in [email or web address]/.

## 2020-11-01 NOTE — Telephone Encounter (Signed)
Called pt to confirm appt with pulmonary rehab on 11/04/20 at 9:30am. Pt stated he will be here. Reminded him to wear a mask and closed toed shoes for walk test. Pt verbalized understanding.

## 2020-11-01 NOTE — Patient Instructions (Signed)
Patient instructed to take medications as defined in the Anti-coagulation Track section of this encounter.  Patient instructed to take today's dose.  Patient instructed to take  one of your 29m strength blue warfarin tablets, by mouth, once-daily at 6PM-EXCEPT on Tuesdays, take 1&1/2 tablets on Tuesdays. Patient verbalized understanding of these instructions.

## 2020-11-02 NOTE — Progress Notes (Signed)
INTERNAL MEDICINE TEACHING ATTENDING ADDENDUM  I agree with pharmacy recommendations as outlined in their note.   Mashelle Busick N Jessie Schrieber, MD   

## 2020-11-04 ENCOUNTER — Encounter (HOSPITAL_COMMUNITY)
Admission: RE | Admit: 2020-11-04 | Discharge: 2020-11-04 | Disposition: A | Discharge status: Home / Self Care | Payer: Medicare Other | Source: Ambulatory Visit | Attending: Cardiology | Admitting: Cardiology

## 2020-11-04 ENCOUNTER — Encounter (HOSPITAL_COMMUNITY): Payer: Self-pay

## 2020-11-04 ENCOUNTER — Other Ambulatory Visit: Payer: Self-pay

## 2020-11-04 VITALS — BP 94/62 | HR 90 | Ht 65.0 in | Wt 195.5 lb

## 2020-11-04 DIAGNOSIS — I272 Pulmonary hypertension, unspecified: Secondary | ICD-10-CM | POA: Insufficient documentation

## 2020-11-04 NOTE — Progress Notes (Signed)
Jesus Russell 69 y.o. male Pulmonary Rehab Orientation Note This patient who was referred to Pulmonary rehab by Dr. Aundra Dubin with the diagnosis of Pulmonary Hypertension arrived today in Cardiac and Pulmonary Rehab. He arrived ambulatory with a rollator as his assistive device with steady gait. He does carry portable oxygen. [facility] is the provider for their MDE. Per pt, he uses oxygen with exertion and at night. Color good, skin warm and dry. Patient is oriented to time and place. Patient's medical history, psychosocial health, and medications reviewed. 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 Pt hobbies include reading and playing canasta. Pt reports his stress level is low. Areas of stress/anxiety include Health.  Pt does not exhibits signs of depression. PHQ2/9 score 0/4. Pt shows good  coping skills with positive outlook . Jesus Russell was offered emotional support and reassurance. Will continue to monitor and evaluate progress toward psychosocial goal(s) of increasing endurance and ADL's. Patient reports he does take medications as prescribed. Patient states he follows a healthy diet. The patient has been trying to lose weight through a healthy diet and exercise program.. Patient's weight will be monitored closely. Demonstration and practice of PLB using pulse oximeter. Patient able to return demonstration satisfactorily. Safety and hand hygiene in the exercise area reviewed with patient. Patient voices understanding of the information reviewed. Department expectations discussed with patient and achievable goals were set. The patient shows enthusiasm about attending the program and we look forward to working with this nice gentleman. The patient is scheduled to begin exercise on 11/08/20. 45 minutes was spent on a variety of activities such as assessment of the patient, obtaining baseline data including height,  weight, BMI, and grip strength, verifying medical history, allergies, and current medications, and teaching patient strategies for performing tasks with less respiratory effort with emphasis on pursed lip breathing.  Rick Duff MS, ACSM CEP

## 2020-11-04 NOTE — Progress Notes (Signed)
Jesus Russell 69 y.o. male Pulmonary Rehab Lung Assessment Orientation Note  Physical assessment, performed by Trish Fountain RN, reveals heart rate is tachycardic, breath sounds moderate expiratory wheezing heard diffusely throughout both lungs with the Right being greater than the Left.  Anterior breath sounds clear. Jesus Russell, BSN Cardiac and Training and development officer

## 2020-11-04 NOTE — Progress Notes (Signed)
Pulmonary Individual Treatment Plan  Patient Details  Name: Jesus Russell MRN: [id number] Date of Birth: 03-13-1951 Referring Provider:     Pulmonary Rehab Walk Test from 11/04/2020 in [place]  Referring Provider Dr. Aundra Dubin      Initial Encounter Date:    Pulmonary Rehab Walk Test from 11/04/2020 in [place]  Date 11/04/20      Visit Diagnosis: Pulmonary HTN ([place])  Patient's Home Medications on Admission:   Current Outpatient Medications:  .  alendronate (FOSAMAX) 70 MG tablet, Take 1 tablet (70 mg total) by mouth every 7 (seven) days. Take with a full glass of water on an empty stomach. (Patient taking differently: Take 70 mg by mouth every 7 (seven) days. Take with a full glass of water on an empty stomach. Monday), Disp: 12 tablet, Rfl: 3 .  Ascorbic Acid (VITAMIN C) 500 MG CAPS, Take 500 mg by mouth daily., Disp: , Rfl:  .  cholecalciferol (VITAMIN D3) 25 MCG (1000 UT) tablet, Take 1,000 Units by mouth daily., Disp: , Rfl:  .  Fluticasone-Umeclidin-Vilant (TRELEGY ELLIPTA) 200-62.5-25 MCG/INH AEPB, Inhale 1 puff into the lungs daily., Disp: 60 each, Rfl: 2 .  losartan (COZAAR) 25 MG tablet, Take 1 tablet (25 mg total) by mouth daily. (Patient not taking: Reported on 11/04/2020), Disp: 30 tablet, Rfl: 2 .  omega-3 acid ethyl esters (LOVAZA) 1 g capsule, TAKE 1 CAPSULE(1 GRAM TOTAL) BY MOUTH TWICE DAILY, Disp: 180 capsule, Rfl: 0 .  potassium chloride SA (KLOR-CON) 20 MEQ tablet, Take 4 tablets (80 mEq total) by mouth daily., Disp: 120 tablet, Rfl: 11 .  Riociguat (ADEMPAS) 2 MG TABS, Take 2 mg by mouth 3 (three) times daily., Disp: , Rfl:  .  torsemide (DEMADEX) 20 MG tablet, Take 3 tablets (60 mg total) by mouth daily., Disp: 90 tablet, Rfl: 0 .  Treprostinil (TYVASO) 0.6 MG/ML SOLN, Inhale 18 mcg into the lungs as directed., Disp: , Rfl:  .  warfarin (COUMADIN) 4 MG tablet, TAKE 1  TABLET BY MOUTH ONCE DAILY,  EXCEPT ON MONDAYS, TAKE ONLY 1/2 TABLET ON MONDAYS., Disp: 84 tablet, Rfl: 1 .  zinc gluconate 50 MG tablet, Take 50 mg by mouth daily., Disp: , Rfl:   Past Medical History: Past Medical History:  Diagnosis Date  . Acute on chronic respiratory failure with hypoxia ([place])   . COVID-19 virus infection   . DVT of lower extremity, bilateral ([place])   . Epistaxis   . Hypertension   . Incarcerated umbilical hernia 84/1324   Status post repair  . Lupus anticoagulant positive    Per records from [facility]  . 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 Pulmonary artery hypertension ([place]) 04/2011   Mildly elevated pulmonary artery pressure in setting of PE (from [facility])  . Pulmonary embolism associated with COVID-19 ([place])   . Subdural hematoma ([facility])    Remote episode, occurred when taking excedrin and warfarin.     Tobacco Use: Social History   Tobacco Use  Smoking Status Former Smoker  . Packs/day: 1.50  . Years: 23.00  . Pack years: 34.50  . Types: Cigarettes  . Quit date: 36  . Years since quitting: 26.8  Smokeless Tobacco Never Used    Labs: Recent Review Scientist, physiological    Labs for ITP Cardiac and Pulmonary Rehab Latest Ref Rng & Units 05/20/2020 05/21/2020  05/21/2020 07/06/2020 07/06/2020   Trlycerides <150 mg/dL - - - - -   Hemoglobin A1c 4.8 - 5.6 % - - - - -   PHART 7.35 - 7.45 7.284(L) 7.334(L) 7.385 - -   PCO2ART 32 - 48 mmHg 69.7(HH) 64.7(H) 54.5(H) - -   HCO3 20.0 - 28.0 mmol/L 33.0(H) 34.6(H) 31.9(H) 41.4(H) 41.0(H)   TCO2 22 - 32 mmol/L 35(H) 37(H) - 43(H) 43(H)   ACIDBASEDEF 0.0 - 2.0 mmol/L - - - - -   O2SAT % 98.0 97.0 97.7 64.0 59.0      Capillary Blood Glucose: Lab Results  Component Value Date   GLUCAP 75 03/01/2020   GLUCAP 78 03/01/2020   GLUCAP 56 (L) 03/01/2020   GLUCAP 76 03/01/2020   GLUCAP 81 02/29/2020     Pulmonary Assessment Scores:  Pulmonary Assessment Scores    Row  Name 11/04/20 1155         ADL UCSD   ADL Phase Entry     SOB Score total 79       CAT Score   CAT Score 24       mMRC Score   mMRC Score 4           UCSD: Self-administered rating of dyspnea associated with activities of daily living (ADLs) 6-point scale (0 = "not at all" to 5 = "maximal or unable to do because of breathlessness")  Scoring Scores range from 0 to 120.  Minimally important difference is 5 units  CAT: CAT can identify the health impairment of COPD patients and is better correlated with disease progression.  CAT has a scoring range of zero to 40. The CAT score is classified into four groups of low (less than 10), medium (10 - 20), high (21-30) and very high (31-40) based on the impact level of disease on health status. A CAT score over 10 suggests significant symptoms.  A worsening CAT score could be explained by an exacerbation, poor medication adherence, poor inhaler technique, or progression of COPD or comorbid conditions.  CAT MCID is 2 points  mMRC: mMRC (Modified Medical Research Council) Dyspnea Scale is used to assess the degree of baseline functional disability in patients of respiratory disease due to dyspnea. No minimal important difference is established. A decrease in score of 1 point or greater is considered a positive change.   Pulmonary Function Assessment:  Pulmonary Function Assessment - 11/04/20 1012      Breath   Shortness of Breath Yes;Panic with Shortness of Breath;Limiting activity           Exercise Target Goals: Exercise Program Goal: Individual exercise prescription set using results from initial 6 min walk test and THRR while considering  patient's activity barriers and safety.   Exercise Prescription Goal: Initial exercise prescription builds to 30-45 minutes a day of aerobic activity, 2-3 days per week.  Home exercise guidelines will be given to patient during program as part of exercise prescription that the participant will  acknowledge.  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 HR 118 bpm     Max Ex. BP 120/66     2 Minute Post BP 102/68       Interval HR   1 Minute HR 101     2 Minute HR 100     3 Minute HR 107     4 Minute HR 114     5 Minute HR 116     6 Minute HR 118     2 Minute Post HR 103     Interval Heart Rate? Yes       Interval Oxygen   Interval Oxygen? Yes     Baseline Oxygen Saturation % 97 %     1 Minute Oxygen Saturation % 99 %     1 Minute Liters of Oxygen 2 L     2 Minute Oxygen Saturation % 97 %     2 Minute Liters of Oxygen 2 L     3 Minute Oxygen Saturation % 93 %     3 Minute Liters of Oxygen 2 L     4 Minute Oxygen Saturation % 91 %     4 Minute Liters of Oxygen 2 L     5 Minute Oxygen Saturation % 91 %     5 Minute Liters of Oxygen 2 L     6 Minute Oxygen Saturation % 91 %     6 Minute Liters of Oxygen 2 L     2 Minute Post Oxygen Saturation % 96 %     2 Minute Post Liters of Oxygen 2 L            Oxygen Initial Assessment:  Oxygen Initial Assessment - 11/04/20 1055      Home Oxygen   Home Oxygen Device Portable  Concentrator;Home Concentrator    Sleep Oxygen Prescription CPAP    Home Exercise Oxygen Prescription Pulsed    Liters per minute 2    Home Resting Oxygen Prescription Pulsed    Liters per minute 2    Compliance with Home Oxygen Use Yes      Initial 6 min Walk   Oxygen Used Continuous    Liters per minute 2      Program Oxygen Prescription   Program Oxygen Prescription Continuous    Liters per minute 2      Intervention   Short Term Goals To learn and exhibit compliance with exercise, home and travel O2 prescription;To learn and understand importance of monitoring SPO2 with pulse oximeter and demonstrate accurate use of the pulse oximeter.;To learn and understand importance of maintaining oxygen saturations>88%;To learn and demonstrate proper pursed lip breathing techniques or other breathing techniques.;To learn and demonstrate proper use of respiratory medications    Long  Term Goals Exhibits compliance with exercise, home  and travel O2 prescription;Verbalizes importance of monitoring SPO2 with pulse oximeter and return demonstration;Maintenance of O2 saturations>88%;Exhibits proper breathing techniques, such as pursed lip breathing or other method taught during program session;Compliance with respiratory medication;Demonstrates proper use of MDI's           Oxygen Re-Evaluation:   Oxygen Discharge (Final Oxygen Re-Evaluation):   Initial Exercise Prescription:  Initial Exercise Prescription - 11/04/20 1200      Date of Initial Exercise RX and Referring Provider   Date 11/04/20    Referring Provider Dr. Aundra Dubin    Expected Discharge Date 01/05/21      Oxygen   Oxygen Continuous    Liters 2      Recumbant Bike   Level 1.5    Minutes 15      NuStep   Level 2    Minutes 15      Prescription Details   Frequency (times per week) 2    Duration Progress to 30 minutes of continuous aerobic without signs/symptoms of physical distress      Intensity   THRR 40-80% of Max  Heartrate 61-122    Ratings of Perceived Exertion 11-13    Perceived Dyspnea 0-4      Progression   Progression Continue to progress workloads to maintain intensity without signs/symptoms of physical distress.      Resistance Training   Training Prescription Yes    Weight Blue bands    Reps 10-15           Perform Capillary Blood Glucose checks as needed.  Exercise Prescription Changes:   Exercise Comments:   Exercise Goals and Review:  Exercise Goals    Row Name 11/04/20 1054             Exercise Goals   Increase Physical Activity Yes       Intervention Provide advice, education, support and counseling about physical activity/exercise needs.;Develop an individualized exercise prescription for aerobic and resistive training based on initial evaluation findings, risk stratification, comorbidities and participant's personal goals.       Expected Outcomes Short Term: Attend rehab on a regular basis to increase amount of physical activity.;Long Term: Add in home exercise to make exercise part of routine and to increase amount of physical activity.;Long Term: Exercising regularly at least 3-5 days a week.       Increase Strength and Stamina Yes       Intervention Provide advice, education, support and counseling about physical activity/exercise needs.;Develop an individualized exercise prescription for aerobic and resistive training based on initial evaluation findings, risk stratification, comorbidities and participant's personal goals.       Expected Outcomes Short Term: Increase workloads from initial exercise prescription for resistance, speed, and METs.;Short Term: Perform resistance training exercises routinely during rehab and add in resistance training at home;Long Term: Improve cardiorespiratory fitness, muscular endurance and strength as measured by increased METs and functional capacity (6MWT)       Able to understand and use rate of perceived exertion (RPE) scale Yes        Intervention Provide education and explanation on how to use RPE scale       Expected Outcomes Short Term: Able to use RPE daily in rehab to express subjective intensity level;Long Term:  Able to use RPE to guide intensity level when exercising independently       Able to understand and use Dyspnea scale Yes       Intervention Provide education and explanation on how to use  Dyspnea scale       Expected Outcomes Short Term: Able to use Dyspnea scale daily in rehab to express subjective sense of shortness of breath during exertion;Long Term: Able to use Dyspnea scale to guide intensity level when exercising independently       Knowledge and understanding of Target Heart Rate Range (THRR) Yes       Intervention Provide education and explanation of THRR including how the numbers were predicted and where they are located for reference       Expected Outcomes Short Term: Able to state/look up THRR;Long Term: Able to use THRR to govern intensity when exercising independently;Short Term: Able to use daily as guideline for intensity in rehab       Understanding of Exercise Prescription Yes       Intervention Provide education, explanation, and written materials on patient's individual exercise prescription       Expected Outcomes Short Term: Able to explain program exercise prescription;Long Term: Able to explain home exercise prescription to exercise independently              Exercise Goals Re-Evaluation :   Discharge Exercise Prescription (Final Exercise Prescription Changes):   Nutrition:  Target Goals: Understanding of nutrition guidelines, daily intake of sodium <1563m, cholesterol <2058m calories 30% from fat and 7% or less from saturated fats, daily to have 5 or more servings of fruits and vegetables.  Biometrics:  Pre Biometrics - 11/04/20 1130      Pre Biometrics   Grip Strength 17 kg            Nutrition Therapy Plan and Nutrition Goals:   Nutrition  Assessments:  MEDIFICTS Score Key:  ?70 Need to make dietary changes   40-70 Heart Healthy Diet  ? 40 Therapeutic Level Cholesterol Diet   Picture Your Plate Scores:  <4<37nhealthy dietary pattern with much room for improvement.  41-50 Dietary pattern unlikely to meet recommendations for good health and room for improvement.  51-60 More healthful dietary pattern, with some room for improvement.   >60 Healthy dietary pattern, although there may be some specific behaviors that could be improved.    Nutrition Goals Re-Evaluation:   Nutrition Goals Discharge (Final Nutrition Goals Re-Evaluation):   Psychosocial: Target Goals: Acknowledge presence or absence of significant depression and/or stress, maximize coping skills, provide positive support system. Participant is able to verbalize types and ability to use techniques and skills needed for reducing stress and depression.  Initial Review & Psychosocial Screening:  Initial Psych Review & Screening - 11/04/20 1013      Initial Review   Current issues with None Identified      Family Dynamics   Good Support System? Yes   Drug Rehab Facility     Barriers   Psychosocial barriers to participate in program The patient should benefit from training in stress management and relaxation.      Screening Interventions   Interventions Encouraged to exercise           Quality of Life Scores:  Scores of 19 and below usually indicate a poorer quality of life in these areas.  A difference of  2-3 points is a clinically meaningful difference.  A difference of 2-3 points in the total score of the Quality of Life Index has been associated with significant improvement in overall quality of life, self-image, physical symptoms, and general health in studies assessing change in quality of life.  PHQ-9: Recent Review Flowsheet Data    Depression  screen [facility] 2/9 11/04/2020 11/04/2020 08/03/2020 03/31/2020 02/03/2020   Decreased Interest 0 0 _0 Down, Depressed, Hopeless 0 0 1 0 0   PHQ - 2 Score 0 0 _1 Altered sleeping 1 - 3 0 1   Tired, decreased energy 3 - _2 Change in appetite 0 - 0 0 0   Feeling bad or failure about yourself  0 - 0 0 0   Trouble concentrating 0 - 0 0 0   Moving slowly or fidgety/restless 0 - 0 0 0   Suicidal thoughts 0 - 0 0 0   PHQ-9 Score 4 - _3 Difficult doing work/chores Somewhat difficult - Not difficult at all Somewhat difficult Somewhat difficult     Interpretation of Total Score  Total Score Depression Severity:  1-4 = Minimal depression, 5-9 = Mild depression, 10-14 = Moderate depression, 15-19 = Moderately severe depression, 20-27 = Severe depression   Psychosocial Evaluation and Intervention:   Psychosocial Re-Evaluation:   Psychosocial Discharge (Final Psychosocial Re-Evaluation):   Education: Education Goals: Education classes will be provided on a weekly basis, covering required topics. Participant will state understanding/return demonstration of topics presented.  Learning Barriers/Preferences:  Learning Barriers/Preferences - 11/04/20 1014      Learning Barriers/Preferences   Learning Barriers None    Learning Preferences Verbal Instruction;Group Instruction;Written Material           Education Topics: Risk Factor Reduction:  -Group instruction that is supported by a PowerPoint presentation. Instructor discusses the definition of a risk factor, different risk factors for pulmonary disease, and how the heart and lungs work together.     Nutrition for Pulmonary Patient:  -Group instruction provided by PowerPoint slides, verbal discussion, and written materials to support subject matter. The instructor gives an explanation and review of healthy diet recommendations, which includes a discussion on weight management, recommendations for fruit and vegetable consumption, as well as protein, fluid, caffeine, fiber, sodium, sugar, and alcohol. Tips for eating when  patients are short of breath are discussed.   Pursed Lip Breathing:  -Group instruction that is supported by demonstration and informational handouts. Instructor discusses the benefits of pursed lip and diaphragmatic breathing and detailed demonstration on how to preform both.     Oxygen Safety:  -Group instruction provided by PowerPoint, verbal discussion, and written material to support subject matter. There is an overview of "What is Oxygen" and "Why do we need it".  Instructor also reviews how to create a safe environment for oxygen use, the importance of using oxygen as prescribed, and the risks of noncompliance. There is a brief discussion on traveling with oxygen and resources the patient may utilize.   Oxygen Equipment:  -Group instruction provided by [facility] Staff utilizing handouts, written materials, and equipment demonstrations.   Signs and Symptoms:  -Group instruction provided by written material and verbal discussion to support subject matter. Warning signs and symptoms of infection, stroke, and heart attack are reviewed and when to call the physician/911 reinforced. Tips for preventing the spread of infection discussed.   Advanced Directives:  -Group instruction provided by verbal instruction and written material to support subject matter. Instructor reviews Advanced Directive laws and proper instruction for filling out document.   Pulmonary Video:  -Group video education that reviews the importance of medication and oxygen compliance, exercise, good nutrition, pulmonary hygiene, and pursed lip and diaphragmatic breathing for the pulmonary patient.   Exercise  for the Pulmonary Patient:  -Group instruction that is supported by a PowerPoint presentation. Instructor discusses benefits of exercise, core components of exercise, frequency, duration, and intensity of an exercise routine, importance of utilizing pulse oximetry during exercise, safety while exercising, and  options of places to exercise outside of rehab.     Pulmonary Medications:  -Verbally interactive group education provided by instructor with focus on inhaled medications and proper administration.   Anatomy and Physiology of the Respiratory System and Intimacy:  -Group instruction provided by PowerPoint, verbal discussion, and written material to support subject matter. Instructor reviews respiratory cycle and anatomical components of the respiratory system and their functions. Instructor also reviews differences in obstructive and restrictive respiratory diseases with examples of each. Intimacy, Sex, and Sexuality differences are reviewed with a discussion on how relationships can change when diagnosed with pulmonary disease. Common sexual concerns are reviewed.   MD DAY -A group question and answer session with a medical doctor that allows participants to ask questions that relate to their pulmonary disease state.   OTHER EDUCATION -Group or individual verbal, written, or video instructions that support the educational goals of the pulmonary rehab program.   Holiday Eating Survival Tips:  -Group instruction provided by PowerPoint slides, verbal discussion, and written materials to support subject matter. The instructor gives patients tips, tricks, and techniques to help them not only survive but enjoy the holidays despite the onslaught of food that accompanies the holidays.   Knowledge Questionnaire Score:  Knowledge Questionnaire Score - 11/04/20 1157      Knowledge Questionnaire Score   Pre Score 6/18           Core Components/Risk Factors/Patient Goals at Admission:  Personal Goals and Risk Factors at Admission - 11/04/20 1015      Core Components/Risk Factors/Patient Goals on Admission    Weight Management Weight Loss    Improve shortness of breath with ADL's Yes    Intervention Provide education, individualized exercise plan and daily activity instruction to help  decrease symptoms of SOB with activities of daily living.    Expected Outcomes Short Term: Improve cardiorespiratory fitness to achieve a reduction of symptoms when performing ADLs;Long Term: Be able to perform more ADLs without symptoms or delay the onset of symptoms           Core Components/Risk Factors/Patient Goals Review:    Core Components/Risk Factors/Patient Goals at Discharge (Final Review):    ITP Comments:   Comments:

## 2020-11-08 ENCOUNTER — Encounter (HOSPITAL_COMMUNITY)
Admission: RE | Admit: 2020-11-08 | Discharge: 2020-11-08 | Disposition: A | Discharge status: Home / Self Care | Payer: Medicare Other | Source: Ambulatory Visit | Attending: Cardiology | Admitting: Cardiology

## 2020-11-08 ENCOUNTER — Other Ambulatory Visit: Payer: Self-pay

## 2020-11-08 VITALS — Wt 198.6 lb

## 2020-11-08 DIAGNOSIS — I272 Pulmonary hypertension, unspecified: Secondary | ICD-10-CM

## 2020-11-08 NOTE — Progress Notes (Signed)
Daily Session Note  Patient Details  Name: Jesus Russell MRN: [id number] Date of Birth: 1951/10/14 Referring Provider:     Pulmonary Rehab Walk Test from 11/04/2020 in [facility]  Referring Provider Dr. Aundra Dubin      Encounter Date: 11/08/2020  Check In:  Session Check In - 11/08/20 1440      Check-In   Supervising physician immediately available to respond to emergencies [facility] immediately available    Physician(s) Dr. Ree Kida    Location [facility]    Staff Present Rosebud Poles, RN, BSN;Lisa Ysidro Evert, RN;Dynastie Knoop Hassell Done, MS, ACSM-CEP, Exercise Physiologist    Virtual Visit No    Medication changes reported     No    Fall or balance concerns reported    No    Tobacco Cessation No Change    Warm-up and Cool-down Performed on first and last piece of equipment    Resistance Training Performed Yes    VAD Patient? No    PAD/SET Patient? No      Pain Assessment   Currently in Pain? No/denies    Pain Score 0-No pain    Multiple Pain Sites No           Capillary Blood Glucose: No results found for this or any previous visit (from the past 24 hour(s)).   Exercise Prescription Changes - 11/08/20 1500      Response to Exercise   Blood Pressure (Admit) 104/60    Blood Pressure (Exercise) 130/80    Blood Pressure (Exit) 100/70    Heart Rate (Admit) 98 bpm    Heart Rate (Exercise) 115 bpm    Heart Rate (Exit) 99 bpm    Oxygen Saturation (Admit) 96 %    Oxygen Saturation (Exercise) 91 %    Oxygen Saturation (Exit) 99 %    Rating of Perceived Exertion (Exercise) 15    Perceived Dyspnea (Exercise) 3    Duration Continue with 30 min of aerobic exercise without signs/symptoms of physical distress.    Intensity Other (comment)   40-80% HR Max     Progression   Progression Continue to progress workloads to maintain intensity without signs/symptoms of physical distress.      Resistance Training   Training Prescription  Yes    Weight Blue bands    Reps 10-15    Time 10 Minutes      Oxygen   Oxygen Continuous    Liters 2      Recumbant Bike   Level 1    Minutes 15      NuStep   Level 2    SPM 60    Minutes 15    METs 1.7           Social History   Tobacco Use  Smoking Status Former Smoker  . Packs/day: 1.50  . Years: 23.00  . Pack years: 34.50  . Types: Cigarettes  . Quit date: 56  . Years since quitting: 26.8  Smokeless Tobacco Never Used    Goals Met:  Proper associated with RPD/PD & O2 Sat Exercise tolerated well No report of cardiac concerns or symptoms Strength training completed today  Goals Unmet:  Not Applicable  Comments: Service time is from 1300 to 1420    Dr. Fransico Him is Medical Director for Cardiac Rehab at [facility].

## 2020-11-10 ENCOUNTER — Encounter (HOSPITAL_COMMUNITY)
Admission: RE | Admit: 2020-11-10 | Discharge: 2020-11-10 | Disposition: A | Discharge status: Home / Self Care | Payer: Medicare Other | Source: Ambulatory Visit | Attending: Cardiology | Admitting: Cardiology

## 2020-11-10 ENCOUNTER — Other Ambulatory Visit: Payer: Self-pay

## 2020-11-10 ENCOUNTER — Encounter (HOSPITAL_COMMUNITY): Payer: Self-pay | Admitting: Cardiology

## 2020-11-10 ENCOUNTER — Ambulatory Visit (HOSPITAL_COMMUNITY)
Admission: RE | Admit: 2020-11-10 | Discharge: 2020-11-10 | Disposition: A | Discharge status: Home / Self Care | Payer: Medicare Other | Source: Ambulatory Visit | Attending: Cardiology | Admitting: Cardiology

## 2020-11-10 VITALS — BP 124/80 | HR 99 | Wt 199.6 lb

## 2020-11-10 DIAGNOSIS — G8929 Other chronic pain: Secondary | ICD-10-CM | POA: Insufficient documentation

## 2020-11-10 DIAGNOSIS — Z7901 Long term (current) use of anticoagulants: Secondary | ICD-10-CM | POA: Diagnosis not present

## 2020-11-10 DIAGNOSIS — I272 Pulmonary hypertension, unspecified: Secondary | ICD-10-CM

## 2020-11-10 DIAGNOSIS — D6862 Lupus anticoagulant syndrome: Secondary | ICD-10-CM | POA: Diagnosis not present

## 2020-11-10 DIAGNOSIS — D869 Sarcoidosis, unspecified: Secondary | ICD-10-CM | POA: Diagnosis not present

## 2020-11-10 DIAGNOSIS — I110 Hypertensive heart disease with heart failure: Secondary | ICD-10-CM | POA: Insufficient documentation

## 2020-11-10 DIAGNOSIS — R0602 Shortness of breath: Secondary | ICD-10-CM | POA: Insufficient documentation

## 2020-11-10 DIAGNOSIS — I50812 Chronic right heart failure: Secondary | ICD-10-CM | POA: Insufficient documentation

## 2020-11-10 DIAGNOSIS — M545 Low back pain, unspecified: Secondary | ICD-10-CM | POA: Insufficient documentation

## 2020-11-10 DIAGNOSIS — G4733 Obstructive sleep apnea (adult) (pediatric): Secondary | ICD-10-CM | POA: Diagnosis not present

## 2020-11-10 DIAGNOSIS — Z79899 Other long term (current) drug therapy: Secondary | ICD-10-CM | POA: Diagnosis not present

## 2020-11-10 DIAGNOSIS — Z8616 Personal history of COVID-19: Secondary | ICD-10-CM | POA: Diagnosis not present

## 2020-11-10 DIAGNOSIS — Z87891 Personal history of nicotine dependence: Secondary | ICD-10-CM | POA: Diagnosis not present

## 2020-11-10 LAB — BASIC METABOLIC PANEL
Anion gap: 11 (ref 5–15)
BUN: 9 mg/dL (ref 8–23)
CO2: 31 mmol/L (ref 22–32)
Calcium: 9.4 mg/dL (ref 8.9–10.3)
Chloride: 97 mmol/L — ABNORMAL LOW (ref 98–111)
Creatinine, Ser: 1.12 mg/dL (ref 0.61–1.24)
GFR, Estimated: >60 mL/min (ref >60)
Glucose, Bld: 93 mg/dL (ref 70–99)
Potassium: 3.7 mmol/L (ref 3.5–5.1)
Sodium: 139 mmol/L (ref 135–145)

## 2020-11-10 LAB — BRAIN NATRIURETIC PEPTIDE: B Natriuretic Peptide: 39.4 pg/mL (ref 0.0–100.0)

## 2020-11-10 MED ORDER — TORSEMIDE 20 MG PO TABS: 60.0000 mg | ORAL_TABLET | ORAL | 3 refills | Status: DC

## 2020-11-10 MED ORDER — ADEMPAS 2 MG PO TABS: 2.5000 mg | ORAL_TABLET | Freq: Three times a day (TID) | ORAL | 3 refills | Status: DC

## 2020-11-10 MED ORDER — TORSEMIDE 20 MG PO TABS
80.0000 mg | ORAL_TABLET | ORAL | 3 refills | Status: DC
Start: 2020-11-10 — End: 2021-01-23

## 2020-11-10 NOTE — Progress Notes (Addendum)
  6 Min Walk Test Completed  Pt ambulated 76.2 meters O2 Sat ranged 90-99  2 liters n/c HR ranged 92-126

## 2020-11-10 NOTE — Progress Notes (Addendum)
Daily Session Note  Patient Details  Name: Jesus Russell MRN: [id number] Date of Birth: March 27, 1951 Referring Provider:     Pulmonary Rehab Walk Test from 11/04/2020 in [place]  Referring Provider Dr. Aundra Dubin      Encounter Date: 11/10/2020  Check In:  Session Check In - 11/10/20 1342      Check-In   Supervising physician immediately available to respond to emergencies [facility] immediately available    Physician(s) Dr. Algis Liming    Location [facility]    Staff Present Rosebud Poles, RN, BSN;Xaiver Roskelley Ysidro Evert, RN;Jessica Hassell Done, MS, ACSM-CEP, Exercise Physiologist    Virtual Visit No    Medication changes reported     No    Fall or balance concerns reported    No    Tobacco Cessation No Change    Warm-up and Cool-down Performed as group-led instruction    Resistance Training Performed Yes    VAD Patient? No    PAD/SET Patient? No      Pain Assessment   Currently in Pain? No/denies    Multiple Pain Sites No           Capillary Blood Glucose: Results for orders placed or performed during the hospital encounter of 11/10/20 (from the past 24 hour(s))  Basic metabolic panel     Status: Abnormal   Collection Time: 11/10/20 10:22 AM  Result Value Ref Range   Sodium 139 135 - 145 mmol/L   Potassium 3.7 3.5 - 5.1 mmol/L   Chloride 97 (L) 98 - 111 mmol/L   CO2 31 22 - 32 mmol/L   Glucose, Bld 93 70 - 99 mg/dL   BUN 9 8 - 23 mg/dL   Creatinine, Ser 1.12 0.61 - 1.24 mg/dL   Calcium 9.4 8.9 - 10.3 mg/dL   GFR, Estimated >60 >60 mL/min   Anion gap 11 5 - 15  B Nat Peptide     Status: None   Collection Time: 11/10/20 11:35 AM  Result Value Ref Range   B Natriuretic Peptide 39.4 0.0 - 100.0 pg/mL      Social History   Tobacco Use  Smoking Status Former Smoker  . Packs/day: 1.50  . Years: 23.00  . Pack years: 34.50  . Types: Cigarettes  . Quit date: 40  . Years since quitting: 26.8  Smokeless Tobacco Never Used     Goals Met:  Exercise tolerated well No report of cardiac concerns or symptoms Strength training completed today  Goals Unmet:  Not Applicable  Comments: Service time is from 1300 to 1416 Pt was asking about getting his Covid vaccine. I signed him up for 11/25/20 at 1:45 pm at [facility].   Dr. Fransico Him is Medical Director for Cardiac Rehab at [facility].

## 2020-11-10 NOTE — Progress Notes (Signed)
PCP: Madalyn Rob, MD HF Cardiology: Dr. Aundra Dubin  69 y.o. with history of sarcoidosis, prior PE, RV failure was referred by Dr. Shearon Stalls for evaluation of CHF/pulmonary hypertension.  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  Patient was on prednisone for a period of time, this was stopped earlier this year.  He is on 2 L home oxygen.  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 He has had recurrent PEs/DVTs.  He has an IVC filter.  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  Most recent PE was in 2/21 when he had been off warfarin transiently.  Last echo in 5/21 showed EF 55-60% with D-shaped septum and severely dilated/severely dysfunctional RV.  No estimation of PA pressure was available.    RHC in 7/21 showed normal PCWP and moderate PAH with PVR 5 WU. V/Q showed perfusion defects but thought to be due to sarcoidosis.  CTA chest in 7/21 showed no acute or chronic PE, signs of chronic sarcoidosis were present.   Patient presents for followup of pulmonary hypertension/RV failure.  He is now using CPAP.  Weight stable at home.  He continues to use home oxygen.  Still short of breath after walking about 100 feet.  This is mildly improved. No chest pain.  No lightheadedness or palpitations.  Still with chronic low back pain. He has not yet started on Tyvaso, should be sent to him soon. He is doing pulmonary rehab.   ECG (personally reviewed): NSR, RBBB  Labs (6/21): pro-BNP 43, K 4.6, creatinine 0.89 Labs (8/21): hgb 10.5, K 3.8, creatinine 1.28 Labs (10/21): K 5.4, creatinine 1.1  6 minute walk (11/21): 44 m  PMH: 1. Sarcoidosis: "Burned out."  Diagnosed in 1990 with bronchoscopy/biopsy.  Treated with prednisone, now off.  - On 2 L home oxygen chronically.  2. 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 H/o DVT and PE.  Has lupus anticoagulant.   - Most recent PE was in 2/21, he had been off warfarin transiently.   - V/Q scan in 7/21 with perfusion defects, CTA in 7/21 showed no acute or chronic thrombi, signs of sarcoidosis were present.  4. COVID-19 infection 1/21.  5. H/o compression fractures in 2/21.  - Had kyphoplasty 6. RV failure/pulmonary hypertension:  - Echo (5/21) with EF 55-60%, D-shaped interventricular septum, severely dilated RV with severe systolic dysfunction, dilated IVC.  - RHC (7/21) with mean RA 9, PA 62/21 mean 39, mean PCWP 11, CI 2.64, PVR 5 WU.  7. HTN 8. History of subdural hematoma remotely.   Social History   Socioeconomic History  . Marital status: Divorced    Spouse name: Not on file  . Number of children: Not on file  . Years of education: Not on file  . Highest education level: Not on file  Occupational History  . Occupation: retired Pharmacist, hospital  Tobacco Use  . Smoking status: Former Smoker    Packs/day: 1.50    Years: 23.00    Pack years: 34.50    Types: Cigarettes    Quit date: 1995    Years since quitting: 26.8  . Smokeless tobacco: Never Used  Vaping Use  . Vaping Use: Never used  Substance and Sexual Activity  . Alcohol use: Never  . Drug use: Not Currently    Types: "Crack" cocaine    Comment: quit 2008  . Sexual activity: Not on file  Other Topics Concern  . Not on file  Social History Narrative  .  Not on file   Social Determinants of Health   Financial Resource Strain:   . Difficulty of Paying Living Expenses: Not on file  Food Insecurity:   . Worried About Charity fundraiser in the Last Year: Not on file  . Ran Out of Food in the Last Year: Not on file  Transportation Needs:   . Lack of Transportation (Medical): Not on file  . Lack of Transportation (Non-Medical): Not on file  Physical Activity:   . Days of Exercise per Week: Not on file  . Minutes of Exercise per Session: Not on file  Stress:   . Feeling of Stress : Not on file  Social Connections:   . Frequency of Communication with Friends and Family: Not on file  . Frequency  of Social Gatherings with Friends and Family: Not on file  . Attends Religious Services: Not on file  . Active Member of Clubs or Organizations: Not on file  . Attends Archivist Meetings: Not on file  . Marital Status: Not on file  Intimate Partner Violence:   . Fear of Current or Ex-Partner: Not on file  . Emotionally Abused: Not on file  . Physically Abused: Not on file  . Sexually Abused: Not on file   Family History  Adopted: Yes   ROS: All systems reviewed and negative except as per HPI.   Current Outpatient Medications  Medication Sig Dispense Refill  . alendronate (FOSAMAX) 70 MG tablet Take 1 tablet (70 mg total) by mouth every 7 (seven) days. Take with a full glass of water on an empty stomach. (Patient taking differently: Take 70 mg by mouth every 7 (seven) days. Take with a full glass of water on an empty stomach. Monday) 12 tablet 3  . Ascorbic Acid (VITAMIN C) 500 MG CAPS Take 500 mg by mouth daily.    . cholecalciferol (VITAMIN D3) 25 MCG (1000 UT) tablet Take 1,000 Units by mouth daily.    . Fluticasone-Umeclidin-Vilant (TRELEGY ELLIPTA) 200-62.5-25 MCG/INH AEPB Inhale 1 puff into the lungs daily. 60 each 2  . omega-3 acid ethyl esters (LOVAZA) 1 g capsule TAKE 1 CAPSULE(1 GRAM TOTAL) BY MOUTH TWICE DAILY 180 capsule 0  . potassium chloride SA (KLOR-CON) 20 MEQ tablet Take 4 tablets (80 mEq total) by mouth daily. 120 tablet 11  . Riociguat (ADEMPAS) 2 MG TABS Take 2.5 mg by mouth 3 (three) times daily. 90 tablet 3  . torsemide (DEMADEX) 20 MG tablet Take 4 tablets (80 mg total) by mouth every other day. 60 tablet 3  . warfarin (COUMADIN) 4 MG tablet TAKE 1  TABLET BY MOUTH ONCE DAILY, EXCEPT ON MONDAYS, TAKE ONLY 1/2 TABLET ON MONDAYS. 84 tablet 1  . zinc gluconate 50 MG tablet Take 50 mg by mouth daily.    Marland Kitchen torsemide (DEMADEX) 20 MG tablet Take 3 tablets (60 mg total) by mouth every other day. 45 tablet 3  . Treprostinil (TYVASO) 0.6 MG/ML SOLN Inhale 18 mcg  into the lungs as directed. (Patient not taking: Reported on 11/10/2020)     No current facility-administered medications for this encounter.   BP 124/80   Pulse 99   Wt 90.5 kg (199 lb 9.6 oz)   SpO2 95%   BMI 33.22 kg/m  General: NAD Neck: JVP 8-9 cm, no thyromegaly or thyroid nodule.  Lungs: Mild crackles at bases CV: Nondisplaced PMI.  Heart regular S1/S2 with widely split S2, no S3/S4, no murmur.  1+ edema 1/2 to knees  bilaterally.  No carotid bruit.  Normal pedal pulses.  Abdomen: Soft, nontender, no hepatosplenomegaly, no distention.  Skin: Intact without lesions or rashes.  Neurologic: Alert and oriented x 3.  Psych: Normal affect. Extremities: No clubbing or cyanosis.  HEENT: Normal.   Assessment/Plan: 1. RV failure, pulmonary hypertension: Echo in 5/21 showed EF 55-60%, D-shaped septum with severe RV dilation/severe RV dysfunction.  [place] in 7/21 showed mildly elevated RA pressure, moderate PAH with PVR 5 WU.  V/Q scan showed perfusion defects, but CTA chest in 7/21 showed no acute or chronic PE, V/Q findings were likely due to chronic sarcoidosis changes in lungs.  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  Mild volume overload on exam.  NYHA class III.  6 minute walk done to day, limited by back pain.  - Increase torsemide to 80 mg daily alternating with 60 mg daily. BMET/BNP today and BMET in 10 days.   - Increase riociguat to 2.5 mg tid.  - He will be starting on Tyvaso soon (has been approved).   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  Has positive lupus anticoagulant.  - Continue warfarin, should have Lovenox bridge if stops warfarin (had PE when off warfarin transiently in 2/21).  3. Sarcoidosis: History of "burned out" pulmonary sarcoidosis.  This may be the cause of his pulmonary hypertension.  No definitive evidence for cardiac sarcoidosis, but could arrange for cardiac MRI in the future to investigate. He is no longer on  prednisone.  - Continue 2 L home oxygen.  4. HTN: Continue current regimen,  BP controlled.  5. OSA: Use CPAP.   Followup in 2 months.   Loralie Champagne 11/10/2020

## 2020-11-10 NOTE — Patient Instructions (Addendum)
INCREASE Adempas 2.50m Three times a day  START Tyvaso.[phone] give our office a call if you have any problems or questions about obtaining it.  Alternate Torsemide 874m(4 tablets) Every other day and Torsemide 6018m3 tablets) Every other day  Labs done today, your results will be available in MyChart, we will contact you for abnormal readings.  Your physician recommends that you schedule repeat labs in 10-14 days  Your physician recommends that you schedule a follow-up appointment in: 2 months  If you have any questions or concerns before your next appointment please send us Koreamessage through mycMurdo call our office at [phone]  TO LEAVE A MESSAGE FOR THE NURSE SELECT OPTION 2, PLEASE LEAVE A MESSAGE INCLUDING:  YOUR NAME  DATE OF BIRTH  CALL BACK NUMBER  REASON FOR CALL**this is important as we prioritize the call backs  YOU WILL RECEIVE A CALL BACK THE SAME DAY AS LONG AS YOU CALL BEFORE 4:00 PM

## 2020-11-11 ENCOUNTER — Ambulatory Visit (INDEPENDENT_AMBULATORY_CARE_PROVIDER_SITE_OTHER): Payer: Medicare Other | Admitting: Primary Care

## 2020-11-11 VITALS — BP 120/80 | HR 88 | Ht 65.0 in | Wt 201.4 lb

## 2020-11-11 DIAGNOSIS — G4733 Obstructive sleep apnea (adult) (pediatric): Secondary | ICD-10-CM | POA: Diagnosis not present

## 2020-11-11 DIAGNOSIS — J9610 Chronic respiratory failure, unspecified whether with hypoxia or hypercapnia: Secondary | ICD-10-CM | POA: Diagnosis not present

## 2020-11-11 DIAGNOSIS — J449 Chronic obstructive pulmonary disease, unspecified: Secondary | ICD-10-CM

## 2020-11-11 MED ORDER — ALBUTEROL SULFATE HFA 108 (90 BASE) MCG/ACT IN AERS: 2.0000 | INHALATION_SPRAY | Freq: Four times a day (QID) | RESPIRATORY_TRACT | 2 refills | Status: DC | PRN

## 2020-11-11 NOTE — Patient Instructions (Addendum)
COPD: - Continue Trelegy 200 - one puff daily (rinse mouth after use) - Sending in prescription for Albuterol (rescue inhaler) - takes 2 puffs every 4-6 hours for breakthrough shortness of breath  Sleep apnea: - Aim to wear CPAP for minimum for 4 hour each night - Blend 2L oxygen into CPAP at night   Orders: - Change pressure to 5-15cm h20  Follow-up: - 6-8 weeks with Dr. Ander Slade  (New sleep patient/ already had study and started CPAP) - 4 months with Dr. Shearon Stalls for COPD (already established)    CPAP and BPAP Information CPAP and BPAP are methods of helping a person breathe with the use of air pressure. CPAP stands for "continuous positive airway pressure." BPAP stands for "bi-level positive airway pressure." In both methods, air is blown through your nose or mouth and into your air passages to help you breathe well. CPAP and BPAP use different amounts of pressure to blow air. With CPAP, the amount of pressure stays the same while you breathe in and out. With BPAP, the amount of pressure is increased when you breathe in (inhale) so that you can take larger breaths. Your health care provider will recommend whether CPAP or BPAP would be more helpful for you. Why are CPAP and BPAP treatments used? CPAP or BPAP can be helpful if you have:  Sleep apnea.  Chronic obstructive pulmonary disease (COPD).  Heart failure.  Medical conditions that weaken the muscles of the chest including muscular dystrophy, or neurological diseases such as amyotrophic lateral sclerosis (ALS).  Other problems that cause breathing to be weak, abnormal, or difficult. CPAP is most commonly used for obstructive sleep apnea (OSA) to keep the airways from collapsing when the muscles relax during sleep. How is CPAP or BPAP administered? Both CPAP and BPAP are provided by a small machine with a flexible plastic tube that attaches to a plastic mask. You wear the mask. Air is blown through the mask into your nose or mouth.  The amount of pressure that is used to blow the air can be adjusted on the machine. Your health care provider will determine the pressure setting that should be used based on your individual needs. When should CPAP or BPAP be used? In most cases, the mask only needs to be worn during sleep. Generally, the mask needs to be worn throughout the night and during any daytime naps. People with certain medical conditions may also need to wear the mask at other times when they are awake. Follow instructions from your health care provider about when to use the machine. What are some tips for using the mask?   Because the mask needs to be snug, some people feel trapped or closed-in (claustrophobic) when first using the mask. If you feel this way, you may need to get used to the mask. One way to do this is by holding the mask loosely over your nose or mouth and then gradually applying the mask more snugly. You can also gradually increase the amount of time that you use the mask.  Masks are available in various types and sizes. Some fit over your mouth and nose while others fit over just your nose. If your mask does not fit well, talk with your health care provider about getting a different one.  If you are using a mask that fits over your nose and you tend to breathe through your mouth, a chin strap may be applied to help keep your mouth closed.  The CPAP and BPAP  machines have alarms that may sound if the mask comes off or develops a leak.  If you have trouble with the mask, it is very important that you talk with your health care provider about finding a way to make the mask easier to tolerate. Do not stop using the mask. Stopping the use of the mask could have a negative impact on your health. What are some tips for using the machine?  Place your CPAP or BPAP machine on a secure table or stand near an electrical outlet.  Know where the on/off switch is located on the machine.  Follow instructions from  your health care provider about how to set the pressure on your machine and when you should use it.  Do not eat or drink while the CPAP or BPAP machine is on. Food or fluids could get pushed into your lungs by the pressure of the CPAP or BPAP.  Do not smoke. Tobacco smoke residue can damage the machine.  For home use, CPAP and BPAP machines can be rented or purchased through home health care companies. Many different brands of machines are available. Renting a machine before purchasing may help you find out which particular machine works well for you.  Keep the CPAP or BPAP machine and attachments clean. Ask your health care provider for specific instructions. Get help right away if:  You have redness or open areas around your nose or mouth where the mask fits.  You have trouble using the CPAP or BPAP machine.  You cannot tolerate wearing the CPAP or BPAP mask.  You have pain, discomfort, and bloating in your abdomen. Summary  CPAP and BPAP are methods of helping a person breathe with the use of air pressure.  Both CPAP and BPAP are provided by a small machine with a flexible plastic tube that attaches to a plastic mask.  If you have trouble with the mask, it is very important that you talk with your health care provider about finding a way to make the mask easier to tolerate. This information is not intended to replace advice given to you by your health care provider. Make sure you discuss any questions you have with your health care provider. Document Revised: 04/01/2019 Document Reviewed: 10/29/2016 Elsevier Patient Education  [place].

## 2020-11-11 NOTE — Assessment & Plan Note (Signed)
-  Stable interval - Continue Trelegy 200, 1 puff daily in morning (rinse mouth after use) - Sending in Rx for Albuteorl HFA 2 puffs q 6 hours for breakthrough sob

## 2020-11-11 NOTE — Assessment & Plan Note (Signed)
-  Continue 2L oxygen 24/L; blend oxygen into CPAP at night

## 2020-11-11 NOTE — Addendum Note (Signed)
Encounter addended by: [name], MD on: 11/11/2020 12:05 AM  Actions taken: Clinical Note Signed, Level of Service modified, Visit diagnoses modified

## 2020-11-11 NOTE — Assessment & Plan Note (Signed)
-  HST 10/05/20 showed severe OSA with oxygen desaturation - Pressure 10-20cm h20 (Median pressure 7.6cm h20; 95%-12.2cm h20) - Residual AHI 12.4  - Recommend decreasing pressure 5-15cm h20 to see if this can help with airleaks.  - FU in 6 weeks with new sleep doctor/ Dr. Ander Slade

## 2020-11-11 NOTE — Progress Notes (Signed)
_0  ID: Jesus Russell, male    DOB: October 20, 1951, 69 y.o.   MRN: [id number]  Chief Complaint  Patient presents with  . Follow-up    Pt states he is in his second week with wearing the CPAP and states he is still trying to get used to it. Pt said he has not been able to get a total of 4 hours with wearing machine yet.    Referring provider: Madalyn Rob, MD  HPI:  69 year old male, former smoker quit 1990 (34-pack-year history). Past medical history significant for sarcoidosis dx 1990 by bronchoscopy/biopsy (weaned off prednisone), pulmonary embolism (on anticoagulation), hypertension, right heart failure, pulmonary hypertension, obstructive sleep apnea, COVID-19 in January 2021.  Patient of Dr. Shearon Stalls, last seen by pulmonary nurse practitioner for virtual telephone visit on 06/20/2020.  Patient felt to have continued fluid overload despite Zaroxolyn every other day.  Continues Lasix 40 mg daily.  He was ordered for labs.  Following with cardiology, scheduled visit on 06/29/2020.  Previous [facility] pulmonary encounter:  07/12/2020 Patient presents today for a 2-week follow-up visit for dyspnea felt to be related to heart failure and deconditioning. Patient reported 6-7 week increase in exertion dyspnea. He was referred to cardiology and seen on July 7th. He was started on Torsemide 71m in the morning and 421min the evening. He underwent right heart cath on 07/06/20 with Dr. McAundra DubinPatient started on Adempas, awaiting prior authoriation. Reports that his weight is coming down. He is still short of breath, gets winded when standing. He remains on 2-3L oxygen. He never completed sleep study because he can not sleep on his back to compression fractures. Not currently CPAP. Spirometry showed moderate restrictive lung disease with positive bronchodilator response. States that he has difficulty using BREO. His next follow-up with cardiology is August 3rd. He quit smoking tobacco in 1990 and crack  cocaine/marijuana 2008. He likely has underlying COPD. Will trial Breztri.   08/01/2020 Patient contacted today for 2 week follow-up. He was given trial Breztri at last visit d/t suspected underlying COPD. Spirometry showed moderate restrictive lung disease with positive bronchodilator response. He reported difficulty using BREO Ellipta inhaler. He has been using Breztri twice a day as instructed, he would like to continue. Breathing is about the same, this inhaler is easier for him to use. He has not tried using aero chamber with inhaler, he has a couple at home available to use. Respiratory symptoms are baseline. He has lost 10 lbs. He continues to take Torsemide 8058mn the morning and 53m54m the evening. He will be follow up with cardiology end of the month. He was unable to complete in-lab sleep study d/t back pain. He is sleeping in recliner. We will need to check home sleep study.   11/11/2020 - Interim hx Patient presents today for 3 month follow-up COPD/sleep apnea. Breathing remains stable. Breztri was switched to Trelegy 200. He is tolerating medication well and having now issues with this. He does not have a prescription for albuterol, on one occasion he felt like he needed to use a rescue inhaler. He was recently placed on Tylvaso by Dr. McleAundra Dubin will be starting this next week.   HST on 10/05/20 showed severe OSA with moderate-severe oxygen desaturation. He wasn't able to do in-lab sleep study d/t back pain. Test was read by Dr. OlalAnder Slade is newly on CPAP, he received machine 10 days ago. He has been wearing on average 2-3 hours each night. His bedtime is 10pm  and he falls asleep between 11pm-12am. He wakes up frequently to use the restroom. He gets out of bed in the morning between 6-8am. Feels CPAP pressure is too strong at times. Using full face mask. Needs to establish with [facility] sleep doctor.    Airview download 10/11/20-11/09/20: Usage 10/10 days (100%); 2 days > 4 hours Pressure  10-20cm h20 (12.2cm h20- 95%) Airleaks 106L/min (95%) AHI 12.4  No Known Allergies  Immunization History  Administered Date(s) Administered  . Fluad Quad(high Dose 65+) 09/21/2020  . Influenza,inj,Quad PF,6+ Mos 09/09/2019  . PFIZER SARS-COV-2 Vaccination 04/09/2020, 05/02/2020    Past Medical History:  Diagnosis Date  . Acute on chronic respiratory failure with hypoxia ([place])   . COVID-19 virus infection   . DVT of lower extremity, bilateral ([place])   . Epistaxis   . Hypertension   . Incarcerated umbilical hernia 40/3474   Status post repair  . Lupus anticoagulant positive    Per records from [facility]  . 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 Pulmonary artery hypertension ([place]) 04/2011   Mildly elevated pulmonary artery pressure in setting of PE (from [facility])  . Pulmonary embolism associated with COVID-19 ([place])   . Subdural hematoma ([facility])    Remote episode, occurred when taking excedrin and warfarin.     Tobacco History: Social History   Tobacco Use  Smoking Status Former Smoker  . Packs/day: 1.50  . Years: 23.00  . Pack years: 34.50  . Types: Cigarettes  . Quit date: 83  . Years since quitting: 26.9  Smokeless Tobacco Never Used   Counseling given: Not Answered   Outpatient Medications Prior to Visit  Medication Sig Dispense Refill  . alendronate (FOSAMAX) 70 MG tablet Take 1 tablet (70 mg total) by mouth every 7 (seven) days. Take with a full glass of water on an empty stomach. (Patient taking differently: Take 70 mg by mouth every 7 (seven) days. Take with a full glass of water on an empty stomach. Monday) 12 tablet 3  . Ascorbic Acid (VITAMIN C) 500 MG CAPS Take 500 mg by mouth daily.    . cholecalciferol (VITAMIN D3) 25 MCG (1000 UT) tablet Take 1,000 Units by mouth daily.    Marland Kitchen omega-3 acid ethyl esters (LOVAZA) 1 g capsule TAKE 1 CAPSULE(1 GRAM TOTAL) BY MOUTH TWICE DAILY 180 capsule  0  . potassium chloride SA (KLOR-CON) 20 MEQ tablet Take 4 tablets (80 mEq total) by mouth daily. 120 tablet 11  . torsemide (DEMADEX) 20 MG tablet Take 4 tablets (80 mg total) by mouth every other day. 60 tablet 3  . torsemide (DEMADEX) 20 MG tablet Take 3 tablets (60 mg total) by mouth every other day. 45 tablet 3  . Treprostinil (TYVASO) 0.6 MG/ML SOLN Inhale 18 mcg into the lungs as directed.    . warfarin (COUMADIN) 4 MG tablet TAKE 1  TABLET BY MOUTH ONCE DAILY, EXCEPT ON MONDAYS, TAKE ONLY 1/2 TABLET ON MONDAYS. 84 tablet 1  . zinc gluconate 50 MG tablet Take 50 mg by mouth daily.    . Fluticasone-Umeclidin-Vilant (TRELEGY ELLIPTA) 200-62.5-25 MCG/INH AEPB Inhale 1 puff into the lungs daily. 60 each 2  . Riociguat (ADEMPAS) 2 MG TABS Take 2.5 mg by mouth 3 (three) times daily. 90 tablet 3   No facility-administered medications prior to visit.    Review of Systems  Review of Systems  Constitutional: Negative.  Respiratory: Negative for cough and wheezing.        DOE  Cardiovascular: Positive for leg swelling.  Psychiatric/Behavioral: Positive for sleep disturbance.   Physical Exam  BP 120/80 (BP Location: Left Arm, Cuff Size: Normal)   Pulse 88   Ht _0  (1.651 m)   Wt 201 lb 6.4 oz (91.4 kg)   SpO2 90%   BMI 33.51 kg/m  Physical Exam Constitutional:      General: He is not in acute distress.    Appearance: Normal appearance.  HENT:     Head: Normocephalic and atraumatic.     Mouth/Throat:     Mouth: Mucous membranes are moist.     Pharynx: Oropharynx is clear.  Cardiovascular:     Rate and Rhythm: Normal rate and regular rhythm.     Comments: +1 BLE edema, wearing compression stockings Pulmonary:     Effort: Pulmonary effort is normal.     Breath sounds: Normal breath sounds. No wheezing, rhonchi or rales.  Musculoskeletal:     Cervical back: Normal range of motion and neck supple.     Comments: In WC; kyphosis to spine  Skin:    General: Skin is warm and  dry.  Neurological:     General: No focal deficit present.     Mental Status: He is alert and oriented to person, place, and time. Mental status is at baseline.  Psychiatric:        Mood and Affect: Mood normal.        Behavior: Behavior normal.        Thought Content: Thought content normal.        Judgment: Judgment normal.      Lab Results:  CBC    Component Value Date/Time   WBC 11.4 (H) 07/14/2020 2150   RBC 3.91 (L) 07/14/2020 2150   HGB 10.4 (L) 07/14/2020 2150   HGB 11.7 (L) 03/31/2020 1412   HCT 36.2 (L) 07/14/2020 2150   HCT 36.3 (L) 03/31/2020 1412   PLT 399 07/14/2020 2150   PLT 331 03/31/2020 1412   MCV 92.6 07/14/2020 2150   MCV 100 (H) 03/31/2020 1412   MCH 26.6 07/14/2020 2150   MCHC 28.7 (L) 07/14/2020 2150   RDW 19.3 (H) 07/14/2020 2150   RDW 14.3 03/31/2020 1412   LYMPHSABS 1.2 05/20/2020 1229   LYMPHSABS 2.3 03/31/2020 1412   MONOABS 1.2 (H) 05/20/2020 1229   EOSABS 0.0 05/20/2020 1229   EOSABS 0.2 03/31/2020 1412   BASOSABS 0.0 05/20/2020 1229   BASOSABS 0.1 03/31/2020 1412    BMET    Component Value Date/Time   NA 139 11/10/2020 1022   NA 142 08/03/2020 1539   K 3.7 11/10/2020 1022   CL 97 (L) 11/10/2020 1022   CO2 31 11/10/2020 1022   GLUCOSE 93 11/10/2020 1022   BUN 9 11/10/2020 1022   BUN 14 08/03/2020 1539   CREATININE 1.12 11/10/2020 1022   CALCIUM 9.4 11/10/2020 1022   GFRNONAA >60 11/10/2020 1022   GFRAA >60 09/21/2020 1146    BNP    Component Value Date/Time   BNP 39.4 11/10/2020 1135    ProBNP    Component Value Date/Time   PROBNP 43.0 06/13/2020 0944    Imaging: No results found.   Assessment & Plan:   Obstructive sleep apnea - HST 10/05/20 showed severe OSA with oxygen desaturation - Pressure 10-20cm h20 (Median pressure 7.6cm h20; 95%-12.2cm h20) - Residual AHI 12.4  - Recommend decreasing pressure 5-15cm h20  to see if this can help with airleaks.  - FU in 6 weeks with new sleep doctor/ Dr. Ander Slade     Chronic respiratory failure ([place]) - Continue 2L oxygen 24/L; blend oxygen into CPAP at night   COPD (chronic obstructive pulmonary disease) ([facility]) - Stable interval - Continue Trelegy 200, 1 puff daily in morning (rinse mouth after use) - Sending in Rx for Albuteorl HFA 2 puffs q 6 hours for breakthrough sob   Martyn Ehrich, NP 11/21/2020

## 2020-11-14 ENCOUNTER — Other Ambulatory Visit (HOSPITAL_COMMUNITY): Payer: Self-pay | Admitting: *Deleted

## 2020-11-14 ENCOUNTER — Ambulatory Visit (INDEPENDENT_AMBULATORY_CARE_PROVIDER_SITE_OTHER): Payer: Medicare Other | Admitting: Pharmacist

## 2020-11-14 DIAGNOSIS — Z7901 Long term (current) use of anticoagulants: Secondary | ICD-10-CM

## 2020-11-14 DIAGNOSIS — I2699 Other pulmonary embolism without acute cor pulmonale: Secondary | ICD-10-CM

## 2020-11-14 DIAGNOSIS — T45515A Adverse effect of anticoagulants, initial encounter: Secondary | ICD-10-CM

## 2020-11-14 DIAGNOSIS — Z86711 Personal history of pulmonary embolism: Secondary | ICD-10-CM

## 2020-11-14 DIAGNOSIS — Z86718 Personal history of other venous thrombosis and embolism: Secondary | ICD-10-CM

## 2020-11-14 LAB — POCT INR: INR: 2.1 (ref 2.0–3.0)

## 2020-11-14 MED ORDER — ADEMPAS 2.5 MG PO TABS: 2.5000 mg | ORAL_TABLET | Freq: Three times a day (TID) | ORAL | 11 refills | Status: DC

## 2020-11-14 NOTE — Progress Notes (Signed)
Pulmonary Individual Treatment Plan  Patient Details  Name: Jesus Russell MRN: [id number] Date of Birth: Mar 20, 1951 Referring Provider:     Pulmonary Rehab Walk Test from 11/04/2020 in [facility]  Referring Provider Dr. Aundra Dubin      Initial Encounter Date:    Pulmonary Rehab Walk Test from 11/04/2020 in [facility]  Date 11/04/20      Visit Diagnosis: Pulmonary HTN ([place])  Patient's Home Medications on Admission:   Current Outpatient Medications:  .  albuterol (VENTOLIN HFA) 108 (90 Base) MCG/ACT inhaler, Inhale 2 puffs into the lungs every 6 (six) hours as needed for wheezing or shortness of breath., Disp: 18 g, Rfl: 2 .  alendronate (FOSAMAX) 70 MG tablet, Take 1 tablet (70 mg total) by mouth every 7 (seven) days. Take with a full glass of water on an empty stomach. (Patient taking differently: Take 70 mg by mouth every 7 (seven) days. Take with a full glass of water on an empty stomach. Monday), Disp: 12 tablet, Rfl: 3 .  Ascorbic Acid (VITAMIN C) 500 MG CAPS, Take 500 mg by mouth daily., Disp: , Rfl:  .  cholecalciferol (VITAMIN D3) 25 MCG (1000 UT) tablet, Take 1,000 Units by mouth daily., Disp: , Rfl:  .  Fluticasone-Umeclidin-Vilant (TRELEGY ELLIPTA) 200-62.5-25 MCG/INH AEPB, Inhale 1 puff into the lungs daily., Disp: 60 each, Rfl: 2 .  omega-3 acid ethyl esters (LOVAZA) 1 g capsule, TAKE 1 CAPSULE(1 GRAM TOTAL) BY MOUTH TWICE DAILY, Disp: 180 capsule, Rfl: 0 .  potassium chloride SA (KLOR-CON) 20 MEQ tablet, Take 4 tablets (80 mEq total) by mouth daily., Disp: 120 tablet, Rfl: 11 .  Riociguat (ADEMPAS) 2.5 MG TABS, Take 2.5 mg by mouth in the morning, at noon, and at bedtime., Disp: 90 tablet, Rfl: 11 .  torsemide (DEMADEX) 20 MG tablet, Take 4 tablets (80 mg total) by mouth every other day., Disp: 60 tablet, Rfl: 3 .  torsemide (DEMADEX) 20 MG tablet, Take 3 tablets (60 mg total) by mouth every other day., Disp: 45  tablet, Rfl: 3 .  Treprostinil (TYVASO) 0.6 MG/ML SOLN, Inhale 18 mcg into the lungs as directed., Disp: , Rfl:  .  warfarin (COUMADIN) 4 MG tablet, TAKE 1  TABLET BY MOUTH ONCE DAILY, EXCEPT ON MONDAYS, TAKE ONLY 1/2 TABLET ON MONDAYS., Disp: 84 tablet, Rfl: 1 .  zinc gluconate 50 MG tablet, Take 50 mg by mouth daily., Disp: , Rfl:   Past Medical History: Past Medical History:  Diagnosis Date  . Acute on chronic respiratory failure with hypoxia ([place])   . COVID-19 virus infection   . DVT of lower extremity, bilateral ([place])   . Epistaxis   . Hypertension   . Incarcerated umbilical hernia [id number]/9735   Status post repair  . Lupus anticoagulant positive    Per records from [facility]  . 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 Pulmonary artery hypertension ([place]) 04/2011   Mildly elevated pulmonary artery pressure in setting of PE (from [facility])  . Pulmonary embolism associated with COVID-19 ([place])   . Subdural hematoma ([facility])    Remote episode, occurred when taking excedrin and warfarin.     Tobacco Use: Social History   Tobacco Use  Smoking Status Former Smoker  . Packs/day: 1.50  . Years: 23.00  . Pack years: 34.50  . Types: Cigarettes  . Quit date: 55  .  Years since quitting: 26.9  Smokeless Tobacco Never Used    Labs: Recent Chemical engineer    Labs for ITP Cardiac and Pulmonary Rehab Latest Ref Rng & Units 05/20/2020 05/21/2020 05/21/2020 07/06/2020 07/06/2020   Trlycerides <150 mg/dL - - - - -   Hemoglobin A1c 4.8 - 5.6 % - - - - -   PHART 7.35 - 7.45 7.284(L) 7.334(L) 7.385 - -   PCO2ART 32 - 48 mmHg 69.7(HH) 64.7(H) 54.5(H) - -   HCO3 20.0 - 28.0 mmol/L 33.0(H) 34.6(H) 31.9(H) 41.4(H) 41.0(H)   TCO2 22 - 32 mmol/L 35(H) 37(H) - 43(H) 43(H)   ACIDBASEDEF 0.0 - 2.0 mmol/L - - - - -   O2SAT % 98.0 97.0 97.7 64.0 59.0      Capillary Blood Glucose: Lab Results  Component Value Date   GLUCAP 75  03/01/2020   GLUCAP 78 03/01/2020   GLUCAP 56 (L) 03/01/2020   GLUCAP 76 03/01/2020   GLUCAP 81 02/29/2020     Pulmonary Assessment Scores:  Pulmonary Assessment Scores    Row Name 11/04/20 1155         ADL UCSD   ADL Phase Entry     SOB Score total 79       CAT Score   CAT Score 24       mMRC Score   mMRC Score 4           UCSD: Self-administered rating of dyspnea associated with activities of daily living (ADLs) 6-point scale (0 = "not at all" to 5 = "maximal or unable to do because of breathlessness")  Scoring Scores range from 0 to 120.  Minimally important difference is 5 units  CAT: CAT can identify the health impairment of COPD patients and is better correlated with disease progression.  CAT has a scoring range of zero to 40. The CAT score is classified into four groups of low (less than 10), medium (10 - 20), high (21-30) and very high (31-40) based on the impact level of disease on health status. A CAT score over 10 suggests significant symptoms.  A worsening CAT score could be explained by an exacerbation, poor medication adherence, poor inhaler technique, or progression of COPD or comorbid conditions.  CAT MCID is 2 points  mMRC: mMRC (Modified Medical Research Council) Dyspnea Scale is used to assess the degree of baseline functional disability in patients of respiratory disease due to dyspnea. No minimal important difference is established. A decrease in score of 1 point or greater is considered a positive change.   Pulmonary Function Assessment:  Pulmonary Function Assessment - 11/04/20 1012      Breath   Shortness of Breath Yes;Panic with Shortness of Breath;Limiting activity           Exercise Target Goals: Exercise Program Goal: Individual exercise prescription set using results from initial 6 min walk test and THRR while considering  patient's activity barriers and safety.   Exercise Prescription Goal: Initial exercise prescription builds to  30-45 minutes a day of aerobic activity, 2-3 days per week.  Home exercise guidelines will be given to patient during program as part of exercise prescription that the participant will acknowledge.  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 HR 118 bpm     Max Ex. BP 120/66     2 Minute Post BP 102/68       Interval HR   1 Minute HR 101     2 Minute HR 100     3 Minute HR 107     4 Minute HR 114     5 Minute HR 116     6 Minute HR 118     2 Minute Post HR 103     Interval Heart Rate? Yes       Interval Oxygen   Interval Oxygen? Yes     Baseline Oxygen Saturation % 97 %     1 Minute Oxygen Saturation % 99 %     1 Minute Liters of Oxygen 2 L     2 Minute Oxygen Saturation % 97 %     2 Minute Liters of Oxygen 2 L     3 Minute Oxygen Saturation % 93 %     3 Minute Liters of Oxygen 2 L     4 Minute Oxygen Saturation % 91 %     4 Minute Liters of Oxygen 2 L     5 Minute Oxygen Saturation % 91 %     5 Minute Liters of Oxygen 2 L     6 Minute Oxygen Saturation % 91 %     6 Minute Liters of Oxygen 2 L     2 Minute Post Oxygen Saturation % 96 %     2  Minute Post Liters of Oxygen 2 L            Oxygen Initial Assessment:  Oxygen Initial Assessment - 11/04/20 1055      Home Oxygen   Home Oxygen Device Portable Concentrator;Home Concentrator    Sleep Oxygen Prescription CPAP    Home Exercise Oxygen Prescription Pulsed    Liters per minute 2    Home Resting Oxygen Prescription Pulsed    Liters per minute 2    Compliance with Home Oxygen Use Yes      Initial 6 min Walk   Oxygen Used Continuous    Liters per minute 2      Program Oxygen Prescription   Program Oxygen Prescription Continuous    Liters per minute 2      Intervention   Short Term Goals To learn and exhibit compliance with exercise, home and travel O2 prescription;To learn and understand importance of monitoring SPO2 with pulse oximeter and demonstrate accurate use of the pulse  oximeter.;To learn and understand importance of maintaining oxygen saturations>88%;To learn and demonstrate proper pursed lip breathing techniques or other breathing techniques.;To learn and demonstrate proper use of respiratory medications    Long  Term Goals Exhibits compliance with exercise, home and travel O2 prescription;Verbalizes importance of monitoring SPO2 with pulse oximeter and return demonstration;Maintenance of O2 saturations>88%;Exhibits proper breathing techniques, such as pursed lip breathing or other method taught during program session;Compliance with respiratory medication;Demonstrates proper use of MDI's           Oxygen Re-Evaluation:  Oxygen Re-Evaluation    Row Name 11/10/20 0736             Program Oxygen Prescription   Program Oxygen Prescription Continuous       Liters per minute 2         Home Oxygen   Home Oxygen Device Portable Concentrator;Home Concentrator       Sleep Oxygen Prescription CPAP       Liters per minute 4       Home Exercise Oxygen Prescription Pulsed       Liters per minute 2       Home Resting Oxygen Prescription Pulsed       Liters  per minute 2       Compliance with Home Oxygen Use Yes         Goals/Expected Outcomes   Short Term Goals To learn and exhibit compliance with exercise, home and travel O2 prescription;To learn and understand importance of monitoring SPO2 with pulse oximeter and demonstrate accurate use of the pulse oximeter.;To learn and understand importance of maintaining oxygen saturations>88%;To learn and demonstrate proper pursed lip breathing techniques or other breathing techniques.;To learn and demonstrate proper use of respiratory medications       Long  Term Goals Exhibits compliance with exercise, home and travel O2 prescription;Verbalizes importance of monitoring SPO2 with pulse oximeter and return demonstration;Maintenance of O2 saturations>88%;Exhibits proper breathing techniques, such as pursed lip breathing or other method taught during program session;Compliance with respiratory medication;Demonstrates proper use of MDI's       Goals/Expected Outcomes Compliance and understanding of oxygen saturation and pursed lip breathing              Oxygen Discharge (Final Oxygen Re-Evaluation):  Oxygen Re-Evaluation - 11/10/20 0736      Program Oxygen Prescription   Program Oxygen Prescription Continuous    Liters per minute 2      Home Oxygen   Home Oxygen Device Portable Concentrator;Home Concentrator    Sleep Oxygen Prescription CPAP    Liters per minute 4    Home Exercise Oxygen Prescription Pulsed    Liters per minute 2    Home Resting Oxygen Prescription Pulsed    Liters per minute 2    Compliance with Home Oxygen Use Yes      Goals/Expected Outcomes   Short Term Goals To learn and exhibit compliance with exercise, home and travel O2 prescription;To learn and understand importance of monitoring SPO2 with pulse oximeter and demonstrate accurate use of the pulse oximeter.;To learn and understand importance of maintaining oxygen saturations>88%;To learn and demonstrate proper pursed lip  breathing techniques or other breathing techniques.;To learn and demonstrate proper use of respiratory medications    Long  Term Goals Exhibits compliance with exercise, home and travel O2 prescription;Verbalizes importance of monitoring SPO2 with pulse oximeter and return demonstration;Maintenance of O2 saturations>88%;Exhibits proper breathing techniques, such as pursed lip breathing or other method taught during program session;Compliance with  respiratory medication;Demonstrates proper use of MDI's    Goals/Expected Outcomes Compliance and understanding of oxygen saturation and pursed lip breathing           Initial Exercise Prescription:  Initial Exercise Prescription - 11/04/20 1200      Date of Initial Exercise RX and Referring Provider   Date 11/04/20    Referring Provider Dr. Aundra Dubin    Expected Discharge Date 01/05/21      Oxygen   Oxygen Continuous    Liters 2      Recumbant Bike   Level 1.5    Minutes 15      NuStep   Level 2    Minutes 15      Prescription Details   Frequency (times per week) 2    Duration Progress to 30 minutes of continuous aerobic without signs/symptoms of physical distress      Intensity   THRR 40-80% of Max Heartrate 61-122    Ratings of Perceived Exertion 11-13    Perceived Dyspnea 0-4      Progression   Progression Continue to progress workloads to maintain intensity without signs/symptoms of physical distress.      Resistance Training   Training Prescription Yes    Weight Blue bands    Reps 10-15           Perform Capillary Blood Glucose checks as needed.  Exercise Prescription Changes:  Exercise Prescription Changes    Row Name 11/08/20 1500             Response to Exercise   Blood Pressure (Admit) 104/60       Blood Pressure (Exercise) 130/80       Blood Pressure (Exit) 100/70       Heart Rate (Admit) 98 bpm       Heart Rate (Exercise) 115 bpm       Heart Rate (Exit) 99 bpm       Oxygen Saturation (Admit) 96 %        Oxygen Saturation (Exercise) 91 %       Oxygen Saturation (Exit) 99 %       Rating of Perceived Exertion (Exercise) 15       Perceived Dyspnea (Exercise) 3       Duration Continue with 30 min of aerobic exercise without signs/symptoms of physical distress.       Intensity Other (comment)  40-80% HR Max         Progression   Progression Continue to progress workloads to maintain intensity without signs/symptoms of physical distress.         Resistance Training   Training Prescription Yes       Weight Blue bands       Reps 10-15       Time 10 Minutes         Oxygen   Oxygen Continuous       Liters 2         Recumbant Bike   Level 1       Minutes 15         NuStep   Level 2       SPM 60       Minutes 15       METs 1.7              Exercise Comments:  Exercise Comments    Row Name 11/08/20 1504           Exercise Comments Pt completed first day  of exercise and tolerated well with no complaints or concerns.              Exercise Goals and Review:  Exercise Goals    Row Name 11/04/20 1054             Exercise Goals   Increase Physical Activity Yes       Intervention Provide advice, education, support and counseling about physical activity/exercise needs.;Develop an individualized exercise prescription for aerobic and resistive training based on initial evaluation findings, risk stratification, comorbidities and participant's personal goals.       Expected Outcomes Short Term: Attend rehab on a regular basis to increase amount of physical activity.;Long Term: Add in home exercise to make exercise part of routine and to increase amount of physical activity.;Long Term: Exercising regularly at least 3-5 days a week.       Increase Strength and Stamina Yes       Intervention Provide advice, education, support and counseling about physical activity/exercise needs.;Develop an individualized exercise prescription for aerobic and resistive training based on initial  evaluation findings, risk stratification, comorbidities and participant's personal goals.       Expected Outcomes Short Term: Increase workloads from initial exercise prescription for resistance, speed, and METs.;Short Term: Perform resistance training exercises routinely during rehab and add in resistance training at home;Long Term: Improve cardiorespiratory fitness, muscular endurance and strength as measured by increased METs and functional capacity (6MWT)       Able to understand and use rate of perceived exertion (RPE) scale Yes       Intervention Provide education and explanation on how to use RPE scale       Expected Outcomes Short Term: Able to use RPE daily in rehab to express subjective intensity level;Long Term:  Able to use RPE to guide intensity level when exercising independently       Able to understand and use Dyspnea scale Yes       Intervention Provide education and explanation on how to use Dyspnea scale       Expected Outcomes Short Term: Able to use Dyspnea scale daily in rehab to express subjective sense of shortness of breath during exertion;Long Term: Able to use Dyspnea scale to guide intensity level when exercising independently       Knowledge and understanding of Target Heart Rate Range (THRR) Yes       Intervention Provide education and explanation of THRR including how the numbers were predicted and where they are located for reference       Expected Outcomes Short Term: Able to state/look up THRR;Long Term: Able to use THRR to govern intensity when exercising independently;Short Term: Able to use daily as guideline for intensity in rehab       Understanding of Exercise Prescription Yes       Intervention Provide education, explanation, and written materials on patient's individual exercise prescription       Expected Outcomes Short Term: Able to explain program exercise prescription;Long Term: Able to explain home exercise prescription to exercise independently                Exercise Goals Re-Evaluation :  Exercise Goals Re-Evaluation    Row Name 11/10/20 0733             Exercise Goal Re-Evaluation   Exercise Goals Review Increase Physical Activity;Increase Strength and Stamina;Able to understand and use rate of perceived exertion (RPE) scale;Able to understand and use Dyspnea scale;Knowledge and understanding of Target Heart  Rate Range (THRR);Understanding of Exercise Prescription       Comments Pt has completed 1 exercise session and tolerated well with no complaints or concerns. He is exercising at 1.7 METS on the Nustep. Will continue to monitor and progress as he is able.       Expected Outcomes Through exercise at rehab and home the patient will decrease shortness of breath with daily activities and feel confident in carrying out an exercise regimn at home.              Discharge Exercise Prescription (Final Exercise Prescription Changes):  Exercise Prescription Changes - 11/08/20 1500      Response to Exercise   Blood Pressure (Admit) 104/60    Blood Pressure (Exercise) 130/80    Blood Pressure (Exit) 100/70    Heart Rate (Admit) 98 bpm    Heart Rate (Exercise) 115 bpm    Heart Rate (Exit) 99 bpm    Oxygen Saturation (Admit) 96 %    Oxygen Saturation (Exercise) 91 %    Oxygen Saturation (Exit) 99 %    Rating of Perceived Exertion (Exercise) 15    Perceived Dyspnea (Exercise) 3    Duration Continue with 30 min of aerobic exercise without signs/symptoms of physical distress.    Intensity Other (comment)   40-80% HR Max     Progression   Progression Continue to progress workloads to maintain intensity without signs/symptoms of physical distress.      Resistance Training   Training Prescription Yes    Weight Blue bands    Reps 10-15    Time 10 Minutes      Oxygen   Oxygen Continuous    Liters 2      Recumbant Bike   Level 1    Minutes 15      NuStep   Level 2    SPM 60    Minutes 15    METs 1.7            Nutrition:  Target Goals: Understanding of nutrition guidelines, daily intake of sodium <1566m, cholesterol <2064m calories 30% from fat and 7% or less from saturated fats, daily to have 5 or more servings of fruits and vegetables.  Biometrics:  Pre Biometrics - 11/04/20 1130      Pre Biometrics   Grip Strength 17 kg            Nutrition Therapy Plan and Nutrition Goals:   Nutrition Assessments:  MEDIFICTS Score Key:  ?70 Need to make dietary changes   40-70 Heart Healthy Diet  ? 40 Therapeutic Level Cholesterol Diet   Picture Your Plate Scores:  <4<37nhealthy dietary pattern with much room for improvement.  41-50 Dietary pattern unlikely to meet recommendations for good health and room for improvement.  51-60 More healthful dietary pattern, with some room for improvement.   >60 Healthy dietary pattern, although there may be some specific behaviors that could be improved.    Nutrition Goals Re-Evaluation:  Nutrition Goals Re-Evaluation    [place] 11/10/20 0832             Goals   Current Weight 198 lb 10.2 oz (90.1 kg)              Nutrition Goals Discharge (Final Nutrition Goals Re-Evaluation):  Nutrition Goals Re-Evaluation - 11/10/20 0832      Goals   Current Weight 198 lb 10.2 oz (90.1 kg)           Psychosocial: Target  Goals: Acknowledge presence or absence of significant depression and/or stress, maximize coping skills, provide positive support system. Participant is able to verbalize types and ability to use techniques and skills needed for reducing stress and depression.  Initial Review & Psychosocial Screening:  Initial Psych Review & Screening - 11/04/20 1013      Initial Review   Current issues with None Identified      Family Dynamics   Good Support System? Yes   Drug Rehab Facility     Barriers   Psychosocial barriers to participate in program The patient should benefit from training in stress management and  relaxation.      Screening Interventions   Interventions Encouraged to exercise           Quality of Life Scores:  Scores of 19 and below usually indicate a poorer quality of life in these areas.  A difference of  2-3 points is a clinically meaningful difference.  A difference of 2-3 points in the total score of the Quality of Life Index has been associated with significant improvement in overall quality of life, self-image, physical symptoms, and general health in studies assessing change in quality of life.  PHQ-9: Recent Review Flowsheet Data    Depression screen [facility] 2/9 11/04/2020 11/04/2020 08/03/2020 03/31/2020 02/03/2020   Decreased Interest 0 0 _0 Down, Depressed, Hopeless 0 0 1 0 0   PHQ - 2 Score 0 0 _1 Altered sleeping 1 - 3 0 1   Tired, decreased energy 3 - _2 Change in appetite 0 - 0 0 0   Feeling bad or failure about yourself  0 - 0 0 0   Trouble concentrating 0 - 0 0 0   Moving slowly or fidgety/restless 0 - 0 0 0   Suicidal thoughts 0 - 0 0 0   PHQ-9 Score 4 - _3 Difficult doing work/chores Somewhat difficult - Not difficult at all Somewhat difficult Somewhat difficult     Interpretation of Total Score  Total Score Depression Severity:  1-4 = Minimal depression, 5-9 = Mild depression, 10-14 = Moderate depression, 15-19 = Moderately severe depression, 20-27 = Severe depression   Psychosocial Evaluation and Intervention:   Psychosocial Re-Evaluation:  Psychosocial Re-Evaluation    Row Name 11/14/20 1307             Psychosocial Re-Evaluation   Current issues with None Identified       Comments No psychosocial concerns identified at this time.       Expected Outcomes For [place] to be free of psychosocial concerns while participating in pulmonary rehab.       Interventions Encouraged to attend Pulmonary Rehabilitation for the exercise       Continue Psychosocial Services  No Follow up required              Psychosocial Discharge  (Final Psychosocial Re-Evaluation):  Psychosocial Re-Evaluation - 11/14/20 1307      Psychosocial Re-Evaluation   Current issues with None Identified    Comments No psychosocial concerns identified at this time.    Expected Outcomes For Ash[place] to be free of psychosocial concerns while participating in pulmonary rehab.    Interventions Encouraged to attend Pulmonary Rehabilitation for the exercise    Continue Psychosocial Services  No Follow up required           Education: Education Goals: Education classes will be provided on a  weekly basis, covering required topics. Participant will state understanding/return demonstration of topics presented.  Learning Barriers/Preferences:  Learning Barriers/Preferences - 11/04/20 1014      Learning Barriers/Preferences   Learning Barriers None    Learning Preferences Verbal Instruction;Group Instruction;Written Material           Education Topics: Risk Factor Reduction:  -Group instruction that is supported by a PowerPoint presentation. Instructor discusses the definition of a risk factor, different risk factors for pulmonary disease, and how the heart and lungs work together.     Nutrition for Pulmonary Patient:  -Group instruction provided by PowerPoint slides, verbal discussion, and written materials to support subject matter. The instructor gives an explanation and review of healthy diet recommendations, which includes a discussion on weight management, recommendations for fruit and vegetable consumption, as well as protein, fluid, caffeine, fiber, sodium, sugar, and alcohol. Tips for eating when patients are short of breath are discussed.   Pursed Lip Breathing:  -Group instruction that is supported by demonstration and informational handouts. Instructor discusses the benefits of pursed lip and diaphragmatic breathing and detailed demonstration on how to preform both.     Oxygen Safety:  -Group instruction provided by PowerPoint,  verbal discussion, and written material to support subject matter. There is an overview of "What is Oxygen" and "Why do we need it".  Instructor also reviews how to create a safe environment for oxygen use, the importance of using oxygen as prescribed, and the risks of noncompliance. There is a brief discussion on traveling with oxygen and resources the patient may utilize.   Oxygen Equipment:  -Group instruction provided by [facility] Staff utilizing handouts, written materials, and equipment demonstrations.   Signs and Symptoms:  -Group instruction provided by written material and verbal discussion to support subject matter. Warning signs and symptoms of infection, stroke, and heart attack are reviewed and when to call the physician/911 reinforced. Tips for preventing the spread of infection discussed.   Advanced Directives:  -Group instruction provided by verbal instruction and written material to support subject matter. Instructor reviews Advanced Directive laws and proper instruction for filling out document.   Pulmonary Video:  -Group video education that reviews the importance of medication and oxygen compliance, exercise, good nutrition, pulmonary hygiene, and pursed lip and diaphragmatic breathing for the pulmonary patient.   Exercise for the Pulmonary Patient:  -Group instruction that is supported by a PowerPoint presentation. Instructor discusses benefits of exercise, core components of exercise, frequency, duration, and intensity of an exercise routine, importance of utilizing pulse oximetry during exercise, safety while exercising, and options of places to exercise outside of rehab.     Pulmonary Medications:  -Verbally interactive group education provided by instructor with focus on inhaled medications and proper administration.   Anatomy and Physiology of the Respiratory System and Intimacy:  -Group instruction provided by PowerPoint, verbal discussion, and written  material to support subject matter. Instructor reviews respiratory cycle and anatomical components of the respiratory system and their functions. Instructor also reviews differences in obstructive and restrictive respiratory diseases with examples of each. Intimacy, Sex, and Sexuality differences are reviewed with a discussion on how relationships can change when diagnosed with pulmonary disease. Common sexual concerns are reviewed.   MD DAY -A group question and answer session with a medical doctor that allows participants to ask questions that relate to their pulmonary disease state.   OTHER EDUCATION -Group or individual verbal, written, or video instructions that support the educational goals of the pulmonary rehab  program.   PULMONARY REHAB OTHER RESPIRATORY from 11/10/2020 in [place]  Date 11/10/20  Educator --  Willodean Rosenthal reading handout]      Holiday Eating Survival Tips:  -Group instruction provided by PowerPoint slides, verbal discussion, and written materials to support subject matter. The instructor gives patients tips, tricks, and techniques to help them not only survive but enjoy the holidays despite the onslaught of food that accompanies the holidays.   Knowledge Questionnaire Score:  Knowledge Questionnaire Score - 11/04/20 1157      Knowledge Questionnaire Score   Pre Score 6/18           Core Components/Risk Factors/Patient Goals at Admission:  Personal Goals and Risk Factors at Admission - 11/04/20 1015      Core Components/Risk Factors/Patient Goals on Admission    Weight Management Weight Loss    Improve shortness of breath with ADL's Yes    Intervention Provide education, individualized exercise plan and daily activity instruction to help decrease symptoms of SOB with activities of daily living.    Expected Outcomes Short Term: Improve cardiorespiratory fitness to achieve a reduction of symptoms when performing ADLs;Long Term:  Be able to perform more ADLs without symptoms or delay the onset of symptoms           Core Components/Risk Factors/Patient Goals Review:   Goals and Risk Factor Review    Row Name 11/14/20 1309             Core Components/Risk Factors/Patient Goals Review   Personal Goals Review Improve shortness of breath with ADL's;Increase knowledge of respiratory medications and ability to use respiratory devices properly.;Develop more efficient breathing techniques such as purse lipped breathing and diaphragmatic breathing and practicing self-pacing with activity.       Review Kendrik just started the program, has attended 2 exercise sessions, too early to have made progress towards program goals.       Expected Outcomes Michaela will feel stronger in strength and stamina in the next full 30 days due to consistently exercising in pulmonary reha.              Core Components/Risk Factors/Patient Goals at Discharge (Final Review):   Goals and Risk Factor Review - 11/14/20 1309      Core Components/Risk Factors/Patient Goals Review   Personal Goals Review Improve shortness of breath with ADL's;Increase knowledge of respiratory medications and ability to use respiratory devices properly.;Develop more efficient breathing techniques such as purse lipped breathing and diaphragmatic breathing and practicing self-pacing with activity.    Review Kwadwo just started the program, has attended 2 exercise sessions, too early to have made progress towards program goals.    Expected Outcomes Avel will feel stronger in strength and stamina in the next full 30 days due to consistently exercising in pulmonary reha.           ITP Comments:   Comments: ITP REVIEW Pt is making expected progress toward pulmonary rehab goals after completing 2 sessions. Recommend continued exercise, life style modification, education, and utilization of breathing techniques to increase stamina and strength and decrease shortness  of breath with exertion.

## 2020-11-14 NOTE — Patient Instructions (Signed)
Patient instructed to take medications as defined in the Anti-coagulation Track section of this encounter.  Patient instructed to take today's dose.  Patient instructed to take one of your 4mg strength blue warfarin tablets, by mouth, once-daily at 6PM-EXCEPT on Mondays and Thursdays, take 1&1/2 tablets on Tuesdays and Thursdays.  Patient verbalized understanding of these instructions.   

## 2020-11-14 NOTE — Progress Notes (Signed)
Anticoagulation Management Jesus Russell is a 69 y.o. male who reports to the clinic for monitoring of warfarin treatment.    Indication: DVT , History of; PE, History of; Long term current use of anticoagulant, warfarin to an INR goal 2.0 - 3.0.   Duration: indefinite Supervising physician: Lalla Brothers  Anticoagulation Clinic Visit History: Patient does not report signs/symptoms of bleeding or thromboembolism  Other recent changes: No diet, medications, lifestyle changes cited by the patient at this visit.  Anticoagulation Episode Summary    Current INR goal:  2.0-3.0  TTR:  60.8 % (1 y)  Next INR check:  12/12/2020  INR from last check:  2.1 (11/14/2020)  Weekly max warfarin dose:    Target end date:  Indefinite  INR check location:    Preferred lab:    Send INR reminders to:     Indications   Long term (current) use of anticoagulants [Z79.01] History of DVT (deep vein thrombosis) [Z86.718] Pulmonary embolism on long-term anticoagulation therapy (HCC) [I26.99 T45.515A] History of pulmonary embolism [Z86.711]       Comments:          No Known Allergies  Current Outpatient Medications:  .  albuterol (VENTOLIN HFA) 108 (90 Base) MCG/ACT inhaler, Inhale 2 puffs into the lungs every 6 (six) hours as needed for wheezing or shortness of breath., Disp: 18 g, Rfl: 2 .  alendronate (FOSAMAX) 70 MG tablet, Take 1 tablet (70 mg total) by mouth every 7 (seven) days. Take with a full glass of water on an empty stomach. (Patient taking differently: Take 70 mg by mouth every 7 (seven) days. Take with a full glass of water on an empty stomach. Monday), Disp: 12 tablet, Rfl: 3 .  Ascorbic Acid (VITAMIN C) 500 MG CAPS, Take 500 mg by mouth daily., Disp: , Rfl:  .  cholecalciferol (VITAMIN D3) 25 MCG (1000 UT) tablet, Take 1,000 Units by mouth daily., Disp: , Rfl:  .  Fluticasone-Umeclidin-Vilant (TRELEGY ELLIPTA) 200-62.5-25 MCG/INH AEPB, Inhale 1 puff into the lungs daily., Disp: 60  each, Rfl: 2 .  omega-3 acid ethyl esters (LOVAZA) 1 g capsule, TAKE 1 CAPSULE(1 GRAM TOTAL) BY MOUTH TWICE DAILY, Disp: 180 capsule, Rfl: 0 .  potassium chloride SA (KLOR-CON) 20 MEQ tablet, Take 4 tablets (80 mEq total) by mouth daily., Disp: 120 tablet, Rfl: 11 .  Riociguat (ADEMPAS) 2.5 MG TABS, Take 2.5 mg by mouth in the morning, at noon, and at bedtime., Disp: 90 tablet, Rfl: 11 .  torsemide (DEMADEX) 20 MG tablet, Take 4 tablets (80 mg total) by mouth every other day., Disp: 60 tablet, Rfl: 3 .  torsemide (DEMADEX) 20 MG tablet, Take 3 tablets (60 mg total) by mouth every other day., Disp: 45 tablet, Rfl: 3 .  Treprostinil (TYVASO) 0.6 MG/ML SOLN, Inhale 18 mcg into the lungs as directed., Disp: , Rfl:  .  warfarin (COUMADIN) 4 MG tablet, TAKE 1  TABLET BY MOUTH ONCE DAILY, EXCEPT ON MONDAYS, TAKE ONLY 1/2 TABLET ON MONDAYS., Disp: 84 tablet, Rfl: 1 .  zinc gluconate 50 MG tablet, Take 50 mg by mouth daily., Disp: , Rfl:  Past Medical History:  Diagnosis Date  . Acute on chronic respiratory failure with hypoxia ([place])   . COVID-19 virus infection   . DVT of lower extremity, bilateral ([place])   . Epistaxis   . Hypertension   . Incarcerated umbilical hernia [id number]/4401   Status post repair  . Lupus anticoagulant positive    Per records  from [facility]  . 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 Pulmonary artery hypertension ([place]) 04/2011   Mildly elevated pulmonary artery pressure in setting of PE (from [facility])  . Pulmonary embolism associated with COVID-19 ([place])   . Subdural hematoma (HCC)    Remote episode, occurred when taking excedrin and warfarin.    Social History   Socioeconomic History  . Marital status: Divorced    Spouse name: Not on file  . Number of children: Not on file  . Years of education: Not on file  . Highest education level: Not on file  Occupational History  . Occupation: retired Pharmacist, hospital   Tobacco Use  . Smoking status: Former Smoker    Packs/day: 1.50    Years: 23.00    Pack years: 34.50    Types: Cigarettes    Quit date: 1995    Years since quitting: 26.9  . Smokeless tobacco: Never Used  Vaping Use  . Vaping Use: Never used  Substance and Sexual Activity  . Alcohol use: Never  . Drug use: Not Currently    Types: "Crack" cocaine    Comment: quit 2008  . Sexual activity: Not on file  Other Topics Concern  . Not on file  Social History Narrative  . Not on file   Social Determinants of Health   Financial Resource Strain:   . Difficulty of Paying Living Expenses: Not on file  Food Insecurity:   . Worried About Charity fundraiser in the Last Year: Not on file  . Ran Out of Food in the Last Year: Not on file  Transportation Needs:   . Lack of Transportation (Medical): Not on file  . Lack of Transportation (Non-Medical): Not on file  Physical Activity:   . Days of Exercise per Week: Not on file  . Minutes of Exercise per Session: Not on file  Stress:   . Feeling of Stress : Not on file  Social Connections:   . Frequency of Communication with Friends and Family: Not on file  . Frequency of Social Gatherings with Friends and Family: Not on file  . Attends Religious Services: Not on file  . Active Member of Clubs or Organizations: Not on file  . Attends Archivist Meetings: Not on file  . Marital Status: Not on file   Family History  Adopted: Yes    ASSESSMENT Recent Results: The most recent result is correlated with 30 mg per week: Lab Results  Component Value Date   INR 2.1 11/14/2020   INR 1.5 (A) 11/01/2020   INR 3.1 (A) 10/11/2020    Anticoagulation Dosing: Description   Take one of your 77m strength blue warfarin tablets, by mouth, once-daily at 6PM-EXCEPT on Mondays and Thursdays, take 1&1/2 tablets on Tuesdays and Thursdays.      INR today: Therapeutic  PLAN Weekly dose was increased by 6% to 32 mg per week  Patient  Instructions  Patient instructed to take medications as defined in the Anti-coagulation Track section of this encounter.  Patient instructed to take today's dose.  Patient instructed to take one of your 466mstrength blue warfarin tablets, by mouth, once-daily at 6PM-EXCEPT on Mondays and Thursdays, take 1&1/2 tablets on Tuesdays and Thursdays. Patient verbalized understanding of these instructions.    Patient advised to contact clinic or seek medical attention if signs/symptoms of bleeding or thromboembolism occur.  Patient verbalized understanding by  repeating back information and was advised to contact me if further medication-related questions arise. Patient was also provided an information handout.  Follow-up Return in 4 weeks (on 12/12/2020) for Follow up INR.  Pennie Banter, PharmD, CPP  15 minutes spent face-to-face with the patient during the encounter. 50% of time spent on education, including signs/sx bleeding and clotting, as well as food and drug interactions with warfarin. 50% of time was spent on fingerprick POC INR sample collection,processing, results determination, and documentation in http://www.kim.net/.

## 2020-11-15 ENCOUNTER — Encounter (HOSPITAL_COMMUNITY)
Admission: RE | Admit: 2020-11-15 | Discharge: 2020-11-15 | Disposition: A | Discharge status: Home / Self Care | Payer: Medicare Other | Source: Ambulatory Visit | Attending: Cardiology | Admitting: Cardiology

## 2020-11-15 ENCOUNTER — Other Ambulatory Visit: Payer: Self-pay

## 2020-11-15 DIAGNOSIS — I272 Pulmonary hypertension, unspecified: Secondary | ICD-10-CM

## 2020-11-15 NOTE — Progress Notes (Signed)
Jesus Russell 69 y.o. 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      Medications reviewed.   Current Outpatient Medications:    albuterol (VENTOLIN HFA) 108 (90 Base) MCG/ACT inhaler, Inhale 2 puffs into the lungs every 6 (six) hours as needed for wheezing or shortness of breath., Disp: 18 g, Rfl: 2   alendronate (FOSAMAX) 70 MG tablet, Take 1 tablet (70 mg total) by mouth every 7 (seven) days. Take with a full glass of water on an empty stomach. (Patient taking differently: Take 70 mg by mouth every 7 (seven) days. Take with a full glass of water on an empty stomach. Monday), Disp: 12 tablet, Rfl: 3   Ascorbic Acid (VITAMIN C) 500 MG CAPS, Take 500 mg by mouth daily., Disp: , Rfl:    cholecalciferol (VITAMIN D3) 25 MCG (1000 UT) tablet, Take 1,000 Units by mouth daily., Disp: , Rfl:    Fluticasone-Umeclidin-Vilant (TRELEGY ELLIPTA) 200-62.5-25 MCG/INH AEPB, Inhale 1 puff into the lungs daily., Disp: 60 each, Rfl: 2   omega-3 acid ethyl esters (LOVAZA) 1 g capsule, TAKE 1 CAPSULE(1 GRAM TOTAL) BY MOUTH TWICE DAILY, Disp: 180 capsule, Rfl: 0   potassium chloride SA (KLOR-CON) 20 MEQ tablet, Take 4 tablets (80 mEq total) by mouth daily., Disp: 120 tablet, Rfl: 11    Riociguat (ADEMPAS) 2.5 MG TABS, Take 2.5 mg by mouth in the morning, at noon, and at bedtime., Disp: 90 tablet, Rfl: 11   torsemide (DEMADEX) 20 MG tablet, Take 4 tablets (80 mg total) by mouth every other day., Disp: 60 tablet, Rfl: 3   torsemide (DEMADEX) 20 MG tablet, Take 3 tablets (60 mg total) by mouth every other day., Disp: 45 tablet, Rfl: 3   Treprostinil (TYVASO) 0.6 MG/ML SOLN, Inhale 18 mcg into the lungs as directed., Disp: , Rfl:    warfarin (COUMADIN) 4 MG tablet, TAKE 1  TABLET BY MOUTH ONCE DAILY, EXCEPT ON MONDAYS, TAKE ONLY 1/2 TABLET ON MONDAYS., Disp: 84 tablet, Rfl: 1   zinc gluconate 50 MG tablet, Take 50 mg by mouth daily., Disp: , Rfl:    Ht Readings from Last 1 Encounters:  11/11/20 _0  (1.651 m)     Wt Readings from Last 3 Encounters:  11/11/20 201 lb 6.4 oz (91.4 kg)  11/10/20 199 lb 9.6 oz (90.5 kg)  11/08/20 198 lb 10.2 oz (90.1 kg)     There is no height or weight on file to calculate BMI.   Social History   Tobacco Use  Smoking Status Former Smoker   Packs/day: 1.50   Years: 23.00   Pack years: 34.50   Types: Cigarettes   Quit date: 1995   Years since quitting: 26.9  Smokeless  Tobacco Never Used      Nutrition Note  Spoke with pt. Nutrition Plan and Nutrition Survey goals reviewed with pt.    Acid reflux: none reported Appetite: reports good - although he only eats 2 very small meals per day and 1-2 small snacks.  Unintentional weight loss: None - he intentionally lost 32 lbs. Goal to lose another 20 lbs. Difficulty eating: none - wears oxygen all day. He does not procure or prepare foods. Fluids: water,vitamin water, sweet tea  Pt with dx of Right Heart Failure. Per discussion, pt does use canned/convenience foods often. Pt does not add salt to food. Pt does not eat out frequently. Reviewed sodium recommendations and ways to decrease.    Vitamin k intake - he eats large portion of collards or other dark leafy greens 1 time  per week. He occasionally eats foods with moderate vitamin k. We reviewed daily intake to keep INR WNL. He keeps a weight log at home. He is unsure if he is on a fluid restriction. Recommended checking with doctor.   Pt expressed understanding of the information reviewed.    Nutrition Diagnosis ?  Food-medication interaction related to inconsistent intake of high vitamin k foods as evidenced by INR 1.5 on 11/01/20 and 3.1 on 10/11/20 and pt report eating 1.5 cups of dark leafy greens only once per week   Nutrition Intervention ? Pts individual nutrition plan reviewed with pt. ? Benefits of adopting healthy diet reviewed with Rate My Plate survey   ? Pt given handouts for:  ? Consistent vit K diet ? low sodium ?  ? Continue client-centered nutrition education by RD, as part of interdisciplinary care.  Goal(s) ? Pt to have consistent intake of vitamin k foods ? Pt to continue weighing daily  ? Pt to limit sodium intake by reading labels and limiting to <300 mg/label  Plan:   Will provide client-centered nutrition education as part of interdisciplinary care  Monitor and evaluate progress toward nutrition goal with team.   Michaele Offer, MS, RDN, LDN

## 2020-11-15 NOTE — Progress Notes (Signed)
INTERNAL MEDICINE TEACHING ATTENDING ADDENDUM ° °I agree with pharmacy recommendations as outlined in their note.  ° °-Taeveon Keesling MD ° °

## 2020-11-15 NOTE — Progress Notes (Signed)
Daily Session Note  Patient Details  Name: Jesus Russell MRN: [id number] Date of Birth: 09-09-1951 Referring Provider:     Pulmonary Rehab Walk Test from 11/04/2020 in [place]  Referring Provider Dr. Aundra Dubin      Encounter Date: 11/15/2020  Check In:  Session Check In - 11/15/20 1347      Check-In   Supervising physician immediately available to respond to emergencies [facility] immediately available    Physician(s) Dr. Wynelle Cleveland    Location [facility]    Staff Present Rosebud Poles, RN, BSN;Commodore [place]w Ysidro Evert, RN;Jessica Hassell Done, MS, ACSM-CEP, Exercise Physiologist    Virtual Visit No    Medication changes reported     No    Fall or balance concerns reported    No    Tobacco Cessation No Change    Warm-up and Cool-down Performed as group-led instruction    Resistance Training Performed Yes    VAD Patient? No    PAD/SET Patient? No      Pain Assessment   Currently in Pain? No/denies    Multiple Pain Sites No           Capillary Blood Glucose: No results found for this or any previous visit (from the past 24 hour(s)).    Social History   Tobacco Use  Smoking Status Former Smoker  . Packs/day: 1.50  . Years: 23.00  . Pack years: 34.50  . Types: Cigarettes  . Quit date: 15  . Years since quitting: 26.9  Smokeless Tobacco Never Used    Goals Met:  Exercise tolerated well No report of cardiac concerns or symptoms Strength training completed today  Goals Unmet:  Not Applicable  Comments: Service time is from 1300 to 1415    Dr. Fransico Him is Medical Director for Cardiac Rehab at [facility].

## 2020-11-16 ENCOUNTER — Other Ambulatory Visit (HOSPITAL_COMMUNITY): Payer: Self-pay | Admitting: *Deleted

## 2020-11-16 DIAGNOSIS — I50812 Chronic right heart failure: Secondary | ICD-10-CM

## 2020-11-17 ENCOUNTER — Other Ambulatory Visit: Payer: Self-pay | Admitting: Primary Care

## 2020-11-21 ENCOUNTER — Ambulatory Visit (HOSPITAL_COMMUNITY)
Admission: RE | Admit: 2020-11-21 | Discharge: 2020-11-21 | Disposition: A | Discharge status: Home / Self Care | Payer: Medicare Other | Source: Ambulatory Visit | Attending: Cardiology | Admitting: Cardiology

## 2020-11-21 ENCOUNTER — Other Ambulatory Visit: Payer: Self-pay

## 2020-11-21 ENCOUNTER — Encounter: Payer: Self-pay | Admitting: Primary Care

## 2020-11-21 DIAGNOSIS — I50812 Chronic right heart failure: Secondary | ICD-10-CM | POA: Diagnosis present

## 2020-11-21 LAB — BASIC METABOLIC PANEL
Anion gap: 11 (ref 5–15)
BUN: 10 mg/dL (ref 8–23)
CO2: 32 mmol/L (ref 22–32)
Calcium: 9.1 mg/dL (ref 8.9–10.3)
Chloride: 92 mmol/L — ABNORMAL LOW (ref 98–111)
Creatinine, Ser: 1.14 mg/dL (ref 0.61–1.24)
GFR, Estimated: >60 mL/min (ref >60)
Glucose, Bld: 91 mg/dL (ref 70–99)
Potassium: 3.3 mmol/L — ABNORMAL LOW (ref 3.5–5.1)
Sodium: 135 mmol/L (ref 135–145)

## 2020-11-22 ENCOUNTER — Encounter (HOSPITAL_COMMUNITY)
Admission: RE | Admit: 2020-11-22 | Discharge: 2020-11-22 | Disposition: A | Discharge status: Home / Self Care | Payer: Medicare Other | Source: Ambulatory Visit | Attending: Cardiology | Admitting: Cardiology

## 2020-11-22 VITALS — Wt 192.2 lb

## 2020-11-22 DIAGNOSIS — I272 Pulmonary hypertension, unspecified: Secondary | ICD-10-CM | POA: Diagnosis not present

## 2020-11-22 NOTE — Progress Notes (Signed)
Daily Session Note  Patient Details  Name: DEMETRUIS DEPAUL MRN: [id number] Date of Birth: [birth year] Referring Provider:     Pulmonary Rehab Walk Test from 11/04/2020 in [place]  Referring Provider Dr. Aundra Dubin      Encounter Date: 11/22/2020  Check In:  Session Check In - 11/22/20 1423      Check-In   Supervising physician immediately available to respond to emergencies [facility] immediately available    Physician(s) Dr. Tyrell Antonio    Location [facility]    Staff Present Rosebud Poles, RN, BSN;Lisa Ysidro Evert, RN;Zacaria Pousson Hassell Done, MS, ACSM-CEP, Exercise Physiologist    Virtual Visit No    Medication changes reported     No    Fall or balance concerns reported    No    Tobacco Cessation No Change    Warm-up and Cool-down Performed as group-led instruction    Resistance Training Performed Yes    VAD Patient? No    PAD/SET Patient? No      Pain Assessment   Currently in Pain? No/denies    Multiple Pain Sites No           Capillary Blood Glucose: No results found for this or any previous visit (from the past 24 hour(s)).   Exercise Prescription Changes - 11/22/20 1500      Response to Exercise   Blood Pressure (Admit) 110/64    Blood Pressure (Exercise) 128/72    Blood Pressure (Exit) 100/60    Heart Rate (Admit) 106 bpm    Heart Rate (Exercise) 120 bpm    Heart Rate (Exit) 105 bpm    Oxygen Saturation (Admit) 91 %    Oxygen Saturation (Exercise) 90 %    Oxygen Saturation (Exit) 98 %    Rating of Perceived Exertion (Exercise) 13    Perceived Dyspnea (Exercise) 3    Symptoms --   shortness of breath   Duration Progress to 45 minutes of aerobic exercise without signs/symptoms of physical distress    Intensity THRR unchanged      Progression   Progression Continue to progress workloads to maintain intensity without signs/symptoms of physical distress.      Resistance Training   Training Prescription Yes     Weight Blue bands    Reps 10-15    Time 10 Minutes      Oxygen   Oxygen Continuous    Liters 2      Recumbant Bike   Level 1    Minutes 15    METs 1.6      NuStep   Level 2    SPM 60    Minutes 15    METs 1.6           Social History   Tobacco Use  Smoking Status Former Smoker  . Packs/day: 1.50  . Years: 23.00  . Pack years: 34.50  . Types: Cigarettes  . Quit date: 6  . Years since quitting: 26.9  Smokeless Tobacco Never Used    Goals Met:  Proper associated with RPD/PD & O2 Sat Achieving weight loss Exercise tolerated well No report of cardiac concerns or symptoms Strength training completed today  Goals Unmet:  Not Applicable  Comments: Service time is from 1310 to 1415    Dr. Fransico Him is Medical Director for Cardiac Rehab at [facility].

## 2020-11-23 ENCOUNTER — Telehealth (HOSPITAL_COMMUNITY): Payer: Self-pay

## 2020-11-23 NOTE — Telephone Encounter (Signed)
-----  Message from [name], MD sent at 11/21/2020 10:32 PM EST ----- Increase K in diet.

## 2020-11-23 NOTE — Telephone Encounter (Signed)
Malena Edman, RN  11/23/2020 12:38 PM EST Back to Top    Patient advised and verbalized understanding    Malena Edman, RN  11/22/2020 2:46 PM EST     Left message to return call

## 2020-11-24 ENCOUNTER — Encounter (HOSPITAL_COMMUNITY): Payer: Medicare Other

## 2020-11-24 ENCOUNTER — Telehealth (HOSPITAL_COMMUNITY): Payer: Self-pay | Admitting: Internal Medicine

## 2020-11-25 ENCOUNTER — Ambulatory Visit: Payer: Medicare Other | Attending: Internal Medicine

## 2020-11-25 DIAGNOSIS — Z23 Encounter for immunization: Secondary | ICD-10-CM

## 2020-11-28 ENCOUNTER — Ambulatory Visit (HOSPITAL_COMMUNITY): Payer: Medicare Other

## 2020-11-29 ENCOUNTER — Other Ambulatory Visit: Payer: Self-pay

## 2020-11-29 ENCOUNTER — Encounter (HOSPITAL_COMMUNITY)
Admission: RE | Admit: 2020-11-29 | Discharge: 2020-11-29 | Disposition: A | Discharge status: Home / Self Care | Payer: Medicare Other | Source: Ambulatory Visit | Attending: Cardiology | Admitting: Cardiology

## 2020-11-29 DIAGNOSIS — I272 Pulmonary hypertension, unspecified: Secondary | ICD-10-CM | POA: Diagnosis not present

## 2020-11-29 NOTE — Progress Notes (Signed)
Daily Session Note  Patient Details  Name: Jesus Russell MRN: [id number] Date of Birth: 12-12-1951 Referring Provider:     Pulmonary Rehab Walk Test from 11/04/2020 in [place]  Referring Provider Dr. Aundra Dubin      Encounter Date: 11/29/2020  Check In:  Session Check In - 11/29/20 1312      Check-In   Supervising physician immediately available to respond to emergencies [facility] immediately available    Physician(s) Dr. Cyndia Skeeters    Location [facility]    Staff Present Rosebud Poles, RN, Roque Cash, RN    Virtual Visit No    Medication changes reported     No    Fall or balance concerns reported    No    Tobacco Cessation No Change    Warm-up and Cool-down Performed on first and last piece of equipment    Resistance Training Performed Yes    VAD Patient? No    PAD/SET Patient? No      Pain Assessment   Currently in Pain? No/denies    Multiple Pain Sites No           Capillary Blood Glucose: No results found for this or any previous visit (from the past 24 hour(s)).    Social History   Tobacco Use  Smoking Status Former Smoker  . Packs/day: 1.50  . Years: 23.00  . Pack years: 34.50  . Types: Cigarettes  . Quit date: 26  . Years since quitting: 26.9  Smokeless Tobacco Never Used    Goals Met:  Exercise tolerated well No report of cardiac concerns or symptoms Strength training completed today  Goals Unmet:  Not Applicable  Comments: Service time is from 1300 to 1414    Dr. Fransico Him is Medical Director for Cardiac Rehab at [facility]

## 2020-12-01 ENCOUNTER — Encounter (HOSPITAL_COMMUNITY)
Admission: RE | Admit: 2020-12-01 | Discharge: 2020-12-01 | Disposition: A | Discharge status: Home / Self Care | Payer: Medicare Other | Source: Ambulatory Visit | Attending: Cardiology | Admitting: Cardiology

## 2020-12-01 ENCOUNTER — Other Ambulatory Visit: Payer: Self-pay

## 2020-12-01 VITALS — Wt 197.5 lb

## 2020-12-01 DIAGNOSIS — I272 Pulmonary hypertension, unspecified: Secondary | ICD-10-CM | POA: Diagnosis not present

## 2020-12-01 NOTE — Progress Notes (Signed)
Daily Session Note  Patient Details  Name: NAFIS FARNAN MRN: [id number] Date of Birth: 01/10/1951 Referring Provider:   April Manson Pulmonary Rehab Walk Test from 11/04/2020 in [place]  Referring Provider Dr. Aundra Dubin      Encounter Date: 12/01/2020  Check In:  Session Check In - 12/01/20 1318      Check-In   Supervising physician immediately available to respond to emergencies [facility] immediately available    Physician(s) Dr. Cyndia Skeeters    Location [facility]    Staff Present Rosebud Poles, RN, BSN;Shelley Cocke Ysidro Evert, RN;Jessica Hassell Done, MS, ACSM-CEP, Exercise Physiologist    Virtual Visit No    Medication changes reported     No    Fall or balance concerns reported    No    Tobacco Cessation No Change    Warm-up and Cool-down Performed on first and last piece of equipment    Resistance Training Performed Yes    VAD Patient? No    PAD/SET Patient? No      Pain Assessment   Currently in Pain? No/denies    Multiple Pain Sites No           Capillary Blood Glucose: No results found for this or any previous visit (from the past 24 hour(s)).    Social History   Tobacco Use  Smoking Status Former Smoker  . Packs/day: 1.50  . Years: 23.00  . Pack years: 34.50  . Types: Cigarettes  . Quit date: 85  . Years since quitting: 26.9  Smokeless Tobacco Never Used    Goals Met:  Exercise tolerated well No report of cardiac concerns or symptoms Strength training completed today  Goals Unmet:  O2 Sat Had to use O2 3L on nu-step and recumbent bike to keep saturations in the 90's.  Comments: Service time is from 1300 to 72     Dr. Fransico Him is Medical Director for Cardiac Rehab at [facility].

## 2020-12-06 ENCOUNTER — Encounter (HOSPITAL_COMMUNITY): Payer: Medicare Other

## 2020-12-06 ENCOUNTER — Other Ambulatory Visit: Payer: Self-pay

## 2020-12-06 ENCOUNTER — Encounter (HOSPITAL_COMMUNITY)
Admission: RE | Admit: 2020-12-06 | Discharge: 2020-12-06 | Disposition: A | Discharge status: Home / Self Care | Payer: Medicare Other | Source: Ambulatory Visit | Attending: Cardiology | Admitting: Cardiology

## 2020-12-06 VITALS — Wt 193.6 lb

## 2020-12-06 DIAGNOSIS — I272 Pulmonary hypertension, unspecified: Secondary | ICD-10-CM | POA: Diagnosis not present

## 2020-12-06 NOTE — Progress Notes (Signed)
Pulmonary Individual Treatment Plan  Patient Details  Name: KANAAN KAGAWA MRN: [id number] Date of Birth: 24-Aug-1951 Referring Provider:   April Manson Pulmonary Rehab Walk Test from 11/04/2020 in [facility]  Referring Provider Dr. Aundra Dubin      Initial Encounter Date:  Flowsheet Row Pulmonary Rehab Walk Test from 11/04/2020 in [facility]  Date 11/04/20      Visit Diagnosis: Pulmonary HTN ([place])  Patient's Home Medications on Admission:   Current Outpatient Medications:  .  albuterol (VENTOLIN HFA) 108 (90 Base) MCG/ACT inhaler, Inhale 2 puffs into the lungs every 6 (six) hours as needed for wheezing or shortness of breath., Disp: 18 g, Rfl: 2 .  alendronate (FOSAMAX) 70 MG tablet, Take 1 tablet (70 mg total) by mouth every 7 (seven) days. Take with a full glass of water on an empty stomach. (Patient taking differently: Take 70 mg by mouth every 7 (seven) days. Take with a full glass of water on an empty stomach. Monday), Disp: 12 tablet, Rfl: 3 .  Ascorbic Acid (VITAMIN C) 500 MG CAPS, Take 500 mg by mouth daily., Disp: , Rfl:  .  cholecalciferol (VITAMIN D3) 25 MCG (1000 UT) tablet, Take 1,000 Units by mouth daily., Disp: , Rfl:  .  omega-3 acid ethyl esters (LOVAZA) 1 g capsule, TAKE 1 CAPSULE(1 GRAM TOTAL) BY MOUTH TWICE DAILY, Disp: 180 capsule, Rfl: 0 .  potassium chloride SA (KLOR-CON) 20 MEQ tablet, Take 4 tablets (80 mEq total) by mouth daily., Disp: 120 tablet, Rfl: 11 .  Riociguat (ADEMPAS) 2.5 MG TABS, Take 2.5 mg by mouth in the morning, at noon, and at bedtime., Disp: 90 tablet, Rfl: 11 .  torsemide (DEMADEX) 20 MG tablet, Take 4 tablets (80 mg total) by mouth every other day., Disp: 60 tablet, Rfl: 3 .  torsemide (DEMADEX) 20 MG tablet, Take 3 tablets (60 mg total) by mouth every other day., Disp: 45 tablet, Rfl: 3 .  TRELEGY ELLIPTA 200-62.5-25 MCG/INH AEPB, INHALE 1 PUFF INTO THE LUNGS DAILY, Disp: 60 each,  Rfl: 2 .  Treprostinil (TYVASO) 0.6 MG/ML SOLN, Inhale 18 mcg into the lungs as directed., Disp: , Rfl:  .  warfarin (COUMADIN) 4 MG tablet, TAKE 1  TABLET BY MOUTH ONCE DAILY, EXCEPT ON MONDAYS, TAKE ONLY 1/2 TABLET ON MONDAYS., Disp: 84 tablet, Rfl: 1 .  zinc gluconate 50 MG tablet, Take 50 mg by mouth daily., Disp: , Rfl:   Past Medical History: Past Medical History:  Diagnosis Date  . Acute on chronic respiratory failure with hypoxia ([place])   . COVID-19 virus infection   . DVT of lower extremity, bilateral ([place])   . Epistaxis   . Hypertension   . Incarcerated umbilical hernia 50/0938   Status post repair  . Lupus anticoagulant positive    Per records from [facility]  . 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 Pulmonary artery hypertension ([place]) 04/2011   Mildly elevated pulmonary artery pressure in setting of PE (from [facility])  . Pulmonary embolism associated with COVID-19 ([place])   . Subdural hematoma ([facility])    Remote episode, occurred when taking excedrin and warfarin.     Tobacco Use: Social History   Tobacco Use  Smoking Status Former Smoker  . Packs/day: 1.50  . Years: 23.00  . Pack years: 34.50  . Types: Cigarettes  . Quit date: 53  .  Years since quitting: 26.9  Smokeless Tobacco Never Used    Labs: Recent Chemical engineer    Labs for ITP Cardiac and Pulmonary Rehab Latest Ref Rng & Units 05/20/2020 05/21/2020 05/21/2020 07/06/2020 07/06/2020   Trlycerides <150 mg/dL - - - - -   Hemoglobin A1c 4.8 - 5.6 % - - - - -   PHART 7.350 - 7.450 7.284(L) 7.334(L) 7.385 - -   PCO2ART 32.0 - 48.0 mmHg 69.7(HH) 64.7(H) 54.5(H) - -   HCO3 20.0 - 28.0 mmol/L 33.0(H) 34.6(H) 31.9(H) 41.4(H) 41.0(H)   TCO2 22 - 32 mmol/L 35(H) 37(H) - 43(H) 43(H)   ACIDBASEDEF 0.0 - 2.0 mmol/L - - - - -   O2SAT % 98.0 97.0 97.7 64.0 59.0      Capillary Blood Glucose: Lab Results  Component Value Date   GLUCAP 75  03/01/2020   GLUCAP 78 03/01/2020   GLUCAP 56 (L) 03/01/2020   GLUCAP 76 03/01/2020   GLUCAP 81 02/29/2020     Pulmonary Assessment Scores:  Pulmonary Assessment Scores    Row Name 11/04/20 1155         ADL UCSD   ADL Phase Entry     SOB Score total 79           CAT Score   CAT Score 24           mMRC Score   mMRC Score 4           UCSD: Self-administered rating of dyspnea associated with activities of daily living (ADLs) 6-point scale (0 = "not at all" to 5 = "maximal or unable to do because of breathlessness")  Scoring Scores range from 0 to 120.  Minimally important difference is 5 units  CAT: CAT can identify the health impairment of COPD patients and is better correlated with disease progression.  CAT has a scoring range of zero to 40. The CAT score is classified into four groups of low (less than 10), medium (10 - 20), high (21-30) and very high (31-40) based on the impact level of disease on health status. A CAT score over 10 suggests significant symptoms.  A worsening CAT score could be explained by an exacerbation, poor medication adherence, poor inhaler technique, or progression of COPD or comorbid conditions.  CAT MCID is 2 points  mMRC: mMRC (Modified Medical Research Council) Dyspnea Scale is used to assess the degree of baseline functional disability in patients of respiratory disease due to dyspnea. No minimal important difference is established. A decrease in score of 1 point or greater is considered a positive change.   Pulmonary Function Assessment:  Pulmonary Function Assessment - 11/04/20 1012      Breath   Shortness of Breath Yes;Panic with Shortness of Breath;Limiting activity           Exercise Target Goals: Exercise Program Goal: Individual exercise prescription set using results from initial 6 min walk test and THRR while considering  patient's activity barriers and safety.   Exercise Prescription Goal: Initial exercise prescription  builds to 30-45 minutes a day of aerobic activity, 2-3 days per week.  Home exercise guidelines will be given to patient during program as part of exercise prescription that the participant will acknowledge.  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 HR 118 bpm     Max Ex. BP 120/66     2 Minute Post BP 102/68           Interval HR   1 Minute HR 101     2 Minute HR 100     3 Minute HR 107     4 Minute HR 114     5 Minute HR 116     6 Minute HR 118     2 Minute Post HR 103     Interval Heart Rate? Yes           Interval Oxygen   Interval Oxygen? Yes     Baseline Oxygen Saturation % 97 %     1 Minute Oxygen Saturation % 99 %     1 Minute Liters of Oxygen 2 L     2 Minute Oxygen Saturation % 97 %     2 Minute Liters of Oxygen 2 L     3 Minute Oxygen Saturation % 93 %     3 Minute Liters of Oxygen 2 L     4 Minute Oxygen Saturation % 91 %     4 Minute Liters of Oxygen 2 L     5 Minute Oxygen Saturation % 91 %     5 Minute Liters of Oxygen 2 L     6 Minute Oxygen Saturation % 91 %     6 Minute Liters of Oxygen 2 L     2 Minute Post Oxygen  Saturation % 96 %     2 Minute Post Liters of Oxygen 2 L            Oxygen Initial Assessment:  Oxygen Initial Assessment - 11/04/20 1055      Home Oxygen   Home Oxygen Device Portable Concentrator;Home Concentrator    Sleep Oxygen Prescription CPAP    Home Exercise Oxygen Prescription Pulsed    Liters per minute 2    Home Resting Oxygen Prescription Pulsed    Liters per minute 2    Compliance with Home Oxygen Use Yes      Initial 6 min Walk   Oxygen Used Continuous    Liters per minute 2      Program Oxygen Prescription   Program Oxygen Prescription Continuous    Liters per minute 2      Intervention   Short Term Goals To learn and exhibit compliance with exercise, home and travel O2 prescription;To  learn and understand importance of monitoring SPO2 with pulse oximeter and demonstrate accurate use of the pulse oximeter.;To learn and understand importance of maintaining oxygen saturations>88%;To learn and demonstrate proper pursed lip breathing techniques or other breathing techniques.;To learn and demonstrate proper use of respiratory medications    Long  Term Goals Exhibits compliance with exercise, home and travel O2 prescription;Verbalizes importance of monitoring SPO2 with pulse oximeter and return demonstration;Maintenance of O2 saturations>88%;Exhibits proper breathing techniques, such as pursed lip breathing or other method taught during program session;Compliance with respiratory medication;Demonstrates proper use of MDI's           Oxygen Re-Evaluation:  Oxygen Re-Evaluation    Row Name 11/10/20 0736 12/06/20 0811           Program Oxygen Prescription   Program Oxygen Prescription Continuous Continuous      Liters per minute 2 2             Home Oxygen   Home Oxygen Device Portable Concentrator;Home Concentrator Portable Concentrator;Home Concentrator      Sleep Oxygen Prescription CPAP CPAP      Liters per minute 4 4      Home Exercise Oxygen  Prescription Pulsed Pulsed      Liters per minute 2 2      Home Resting Oxygen Prescription Pulsed Pulsed      Liters per minute 2 2      Compliance with Home Oxygen Use Yes Yes             Goals/Expected Outcomes   Short Term Goals To learn and exhibit compliance with exercise, home and travel O2 prescription;To learn and understand importance of monitoring SPO2 with pulse oximeter and demonstrate accurate use of the pulse oximeter.;To learn and understand importance of maintaining oxygen saturations>88%;To learn and demonstrate proper pursed lip breathing techniques or other breathing techniques.;To learn and demonstrate proper use of respiratory medications To learn and exhibit compliance with exercise, home and travel O2 prescription;To learn and understand importance of monitoring SPO2 with pulse oximeter and demonstrate accurate use of the pulse oximeter.;To learn and understand importance of maintaining oxygen saturations>88%;To learn and demonstrate proper pursed lip breathing techniques or other breathing techniques.;To learn and demonstrate proper use of respiratory medications      Long  Term Goals Exhibits compliance with exercise, home and travel O2 prescription;Verbalizes importance of monitoring SPO2 with pulse oximeter and return demonstration;Maintenance of O2 saturations>88%;Exhibits proper breathing techniques, such as pursed lip breathing or other method taught during program session;Compliance with respiratory medication;Demonstrates proper use of MDI's Exhibits compliance with exercise, home and travel O2 prescription;Verbalizes importance of monitoring SPO2 with pulse oximeter and return demonstration;Maintenance of O2 saturations>88%;Exhibits proper breathing techniques, such as pursed lip breathing or other method taught during program session;Compliance with respiratory medication;Demonstrates proper use of MDI's      Goals/Expected Outcomes Compliance and understanding of  oxygen saturation and pursed lip breathing Compliance and understanding of oxygen saturation and pursed lip breathing             Oxygen Discharge (Final Oxygen Re-Evaluation):  Oxygen Re-Evaluation - 12/06/20 0811      Program Oxygen Prescription   Program Oxygen Prescription Continuous    Liters per minute 2      Home Oxygen   Home Oxygen Device Portable Concentrator;Home Concentrator    Sleep Oxygen Prescription CPAP    Liters per minute 4    Home Exercise Oxygen Prescription Pulsed    Liters per minute 2  Home Resting Oxygen Prescription Pulsed    Liters per minute 2    Compliance with Home Oxygen Use Yes      Goals/Expected Outcomes   Short Term Goals To learn and exhibit compliance with exercise, home and travel O2 prescription;To learn and understand importance of monitoring SPO2 with pulse oximeter and demonstrate accurate use of the pulse oximeter.;To learn and understand importance of maintaining oxygen saturations>88%;To learn and demonstrate proper pursed lip breathing techniques or other breathing techniques.;To learn and demonstrate proper use of respiratory medications    Long  Term Goals Exhibits compliance with exercise, home and travel O2 prescription;Verbalizes importance of monitoring SPO2 with pulse oximeter and return demonstration;Maintenance of O2 saturations>88%;Exhibits proper breathing techniques, such as pursed lip breathing or other method taught during program session;Compliance with respiratory medication;Demonstrates proper use of MDI's    Goals/Expected Outcomes Compliance and understanding of oxygen saturation and pursed lip breathing           Initial Exercise Prescription:  Initial Exercise Prescription - 11/04/20 1200      Date of Initial Exercise RX and Referring Provider   Date 11/04/20    Referring Provider Dr. Aundra Dubin    Expected Discharge Date 01/05/21      Oxygen   Oxygen Continuous    Liters 2      Recumbant Bike   Level 1.5     Minutes 15      NuStep   Level 2    Minutes 15      Prescription Details   Frequency (times per week) 2    Duration Progress to 30 minutes of continuous aerobic without signs/symptoms of physical distress      Intensity   THRR 40-80% of Max Heartrate 61-122    Ratings of Perceived Exertion 11-13    Perceived Dyspnea 0-4      Progression   Progression Continue to progress workloads to maintain intensity without signs/symptoms of physical distress.      Resistance Training   Training Prescription Yes    Weight Blue bands    Reps 10-15           Perform Capillary Blood Glucose checks as needed.  Exercise Prescription Changes:  Exercise Prescription Changes    Row Name 11/08/20 1500 11/22/20 1500           Response to Exercise   Blood Pressure (Admit) 104/60 110/64      Blood Pressure (Exercise) 130/80 128/72      Blood Pressure (Exit) 100/70 100/60      Heart Rate (Admit) 98 bpm 106 bpm      Heart Rate (Exercise) 115 bpm 120 bpm      Heart Rate (Exit) 99 bpm 105 bpm      Oxygen Saturation (Admit) 96 % 91 %      Oxygen Saturation (Exercise) 91 % 90 %      Oxygen Saturation (Exit) 99 % 98 %      Rating of Perceived Exertion (Exercise) 15 13      Perceived Dyspnea (Exercise) 3 3      Symptoms -- --  shortness of breath      Duration Continue with 30 min of aerobic exercise without signs/symptoms of physical distress. Progress to 45 minutes of aerobic exercise without signs/symptoms of physical distress      Intensity Other (comment)  40-80% HR Max THRR unchanged             Progression   Progression Continue to progress  workloads to maintain intensity without signs/symptoms of physical distress. Continue to progress workloads to maintain intensity without signs/symptoms of physical distress.             Resistance Training   Training Prescription Yes Yes      Weight Blue bands Blue bands      Reps 10-15 10-15      Time 10 Minutes 10 Minutes              Oxygen   Oxygen Continuous Continuous      Liters 2 2             Recumbant Bike   Level 1 1      Minutes 15 15      METs -- 1.6             NuStep   Level 2 2      SPM 60 60      Minutes 15 15      METs 1.7 1.6             Exercise Comments:  Exercise Comments    Row Name 11/08/20 1504           Exercise Comments Pt completed first day of exercise and tolerated well with no complaints or concerns.              Exercise Goals and Review:  Exercise Goals    Row Name 11/04/20 1054             Exercise Goals   Increase Physical Activity Yes       Intervention Provide advice, education, support and counseling about physical activity/exercise needs.;Develop an individualized exercise prescription for aerobic and resistive training based on initial evaluation findings, risk stratification, comorbidities and participant's personal goals.       Expected Outcomes Short Term: Attend rehab on a regular basis to increase amount of physical activity.;Long Term: Add in home exercise to make exercise part of routine and to increase amount of physical activity.;Long Term: Exercising regularly at least 3-5 days a week.       Increase Strength and Stamina Yes       Intervention Provide advice, education, support and counseling about physical activity/exercise needs.;Develop an individualized exercise prescription for aerobic and resistive training based on initial evaluation findings, risk stratification, comorbidities and participant's personal goals.       Expected Outcomes Short Term: Increase workloads from initial exercise prescription for resistance, speed, and METs.;Short Term: Perform resistance training exercises routinely during rehab and add in resistance training at home;Long Term: Improve cardiorespiratory fitness, muscular endurance and strength as measured by increased METs and functional capacity (6MWT)       Able to understand and use rate of perceived exertion (RPE)  scale Yes       Intervention Provide education and explanation on how to use RPE scale       Expected Outcomes Short Term: Able to use RPE daily in rehab to express subjective intensity level;Long Term:  Able to use RPE to guide intensity level when exercising independently       Able to understand and use Dyspnea scale Yes       Intervention Provide education and explanation on how to use Dyspnea scale       Expected Outcomes Short Term: Able to use Dyspnea scale daily in rehab to express subjective sense of shortness of breath during exertion;Long Term: Able to use Dyspnea scale to guide intensity level when  exercising independently       Knowledge and understanding of Target Heart Rate Range (THRR) Yes       Intervention Provide education and explanation of THRR including how the numbers were predicted and where they are located for reference       Expected Outcomes Short Term: Able to state/look up THRR;Long Term: Able to use THRR to govern intensity when exercising independently;Short Term: Able to use daily as guideline for intensity in rehab       Understanding of Exercise Prescription Yes       Intervention Provide education, explanation, and written materials on patient's individual exercise prescription       Expected Outcomes Short Term: Able to explain program exercise prescription;Long Term: Able to explain home exercise prescription to exercise independently              Exercise Goals Re-Evaluation :  Exercise Goals Re-Evaluation    Row Name 11/10/20 0733 12/06/20 0807           Exercise Goal Re-Evaluation   Exercise Goals Review Increase Physical Activity;Increase Strength and Stamina;Able to understand and use rate of perceived exertion (RPE) scale;Able to understand and use Dyspnea scale;Knowledge and understanding of Target Heart Rate Range (THRR);Understanding of Exercise Prescription Increase Physical Activity;Increase Strength and Stamina;Able to understand and use rate  of perceived exertion (RPE) scale;Able to understand and use Dyspnea scale;Knowledge and understanding of Target Heart Rate Range (THRR);Understanding of Exercise Prescription      Comments Pt has completed 1 exercise session and tolerated well with no complaints or concerns. He is exercising at 1.7 METS on the Nustep. Will continue to monitor and progress as he is able. [place] has completed 6 exercise sessions. He experiences mild SOB with exertion and has to take a couple of breaks in between exercise equipment. He also struggles on the recumbent bike due to his stomach getting in the way of his range of motion. Otherwise he has tolerated well. He has been a little slow with progressions due to complaints listed above. He is exercising at 1.9 METS on the Nustep and 1.6 METS on the bike. Will continue to monitor and progress as he is able.      Expected Outcomes Through exercise at rehab and home the patient will decrease shortness of breath with daily activities and feel confident in carrying out an exercise regimn at home. Through exercise at rehab and home the patient will decrease shortness of breath with daily activities and feel confident in carrying out an exercise regimn at home.             Discharge Exercise Prescription (Final Exercise Prescription Changes):  Exercise Prescription Changes - 11/22/20 1500      Response to Exercise   Blood Pressure (Admit) 110/64    Blood Pressure (Exercise) 128/72    Blood Pressure (Exit) 100/60    Heart Rate (Admit) 106 bpm    Heart Rate (Exercise) 120 bpm    Heart Rate (Exit) 105 bpm    Oxygen Saturation (Admit) 91 %    Oxygen Saturation (Exercise) 90 %    Oxygen Saturation (Exit) 98 %    Rating of Perceived Exertion (Exercise) 13    Perceived Dyspnea (Exercise) 3    Symptoms --   shortness of breath   Duration Progress to 45 minutes of aerobic exercise without signs/symptoms of physical distress    Intensity THRR unchanged      Progression    Progression Continue to progress  workloads to maintain intensity without signs/symptoms of physical distress.      Resistance Training   Training Prescription Yes    Weight Blue bands    Reps 10-15    Time 10 Minutes      Oxygen   Oxygen Continuous    Liters 2      Recumbant Bike   Level 1    Minutes 15    METs 1.6      NuStep   Level 2    SPM 60    Minutes 15    METs 1.6           Nutrition:  Target Goals: Understanding of nutrition guidelines, daily intake of sodium <1542m, cholesterol <2078m calories 30% from fat and 7% or less from saturated fats, daily to have 5 or more servings of fruits and vegetables.  Biometrics:  Pre Biometrics - 11/04/20 1130      Pre Biometrics   Grip Strength 17 kg            Nutrition Therapy Plan and Nutrition Goals:  Nutrition Therapy & Goals - 12/05/20 1351      Nutrition Therapy   Drug/Food Interactions Coumadin/Vit K      Personal Nutrition Goals   Nutrition Goal Pt to have consistent intake of vitamin k foods    Personal Goal #2 Pt to continue weighing daily    Personal Goal #3 Pt to limit sodium intake by reading labels and limiting to <300 mg/label      Intervention Plan   Intervention Prescribe, educate and counsel regarding individualized specific dietary modifications aiming towards targeted core components such as weight, hypertension, lipid management, diabetes, heart failure and other comorbidities.;Nutrition handout(s) given to patient.    Expected Outcomes Long Term Goal: Adherence to prescribed nutrition plan.;Short Term Goal: Understand basic principles of dietary content, such as calories, fat, sodium, cholesterol and nutrients.           Nutrition Assessments:  MEDIFICTS Score Key:  ?70 Need to make dietary changes   40-70 Heart Healthy Diet  ? 40 Therapeutic Level Cholesterol Diet   Picture Your Plate Scores:  <4<09nhealthy dietary pattern with much room for improvement.  41-50 Dietary  pattern unlikely to meet recommendations for good health and room for improvement.  51-60 More healthful dietary pattern, with some room for improvement.   >60 Healthy dietary pattern, although there may be some specific behaviors that could be improved.    Nutrition Goals Re-Evaluation:  Nutrition Goals Re-Evaluation    [place] 11/10/20 0832 12/05/20 1353           Goals   Current Weight 198 lb 10.2 oz (90.1 kg) 197 lb 8.5 oz (89.6 kg)      Nutrition Goal -- Pt to have consistent intake of vitamin k foods             Personal Goal #2 Re-Evaluation   Personal Goal #2 -- Pt to continue weighing daily             Personal Goal #3 Re-Evaluation   Personal Goal #3 -- Pt to limit sodium intake by reading labels and limiting to <300 mg/label             Nutrition Goals Discharge (Final Nutrition Goals Re-Evaluation):  Nutrition Goals Re-Evaluation - 12/05/20 1353      Goals   Current Weight 197 lb 8.5 oz (89.6 kg)    Nutrition Goal Pt to have consistent intake of vitamin  k foods      Personal Goal #2 Re-Evaluation   Personal Goal #2 Pt to continue weighing daily      Personal Goal #3 Re-Evaluation   Personal Goal #3 Pt to limit sodium intake by reading labels and limiting to <300 mg/label           Psychosocial: Target Goals: Acknowledge presence or absence of significant depression and/or stress, maximize coping skills, provide positive support system. Participant is able to verbalize types and ability to use techniques and skills needed for reducing stress and depression.  Initial Review & Psychosocial Screening:  Initial Psych Review & Screening - 11/04/20 1013      Initial Review   Current issues with None Identified      Family Dynamics   Good Support System? Yes   Drug Rehab Facility     Barriers   Psychosocial barriers to participate in program The patient should benefit from training in stress management and relaxation.      Screening Interventions    Interventions Encouraged to exercise           Quality of Life Scores:  Scores of 19 and below usually indicate a poorer quality of life in these areas.  A difference of  2-3 points is a clinically meaningful difference.  A difference of 2-3 points in the total score of the Quality of Life Index has been associated with significant improvement in overall quality of life, self-image, physical symptoms, and general health in studies assessing change in quality of life.  PHQ-9: Recent Review Flowsheet Data    Depression screen [facility] 2/9 11/04/2020 11/04/2020 08/03/2020 03/31/2020 02/03/2020   Decreased Interest 0 0 _0 Down, Depressed, Hopeless 0 0 1 0 0   PHQ - 2 Score 0 0 _1 Altered sleeping 1 - 3 0 1   Tired, decreased energy 3 - _2 Change in appetite 0 - 0 0 0   Feeling bad or failure about yourself  0 - 0 0 0   Trouble concentrating 0 - 0 0 0   Moving slowly or fidgety/restless 0 - 0 0 0   Suicidal thoughts 0 - 0 0 0   PHQ-9 Score 4 - _3 Difficult doing work/chores Somewhat difficult - Not difficult at all Somewhat difficult Somewhat difficult     Interpretation of Total Score  Total Score Depression Severity:  1-4 = Minimal depression, 5-9 = Mild depression, 10-14 = Moderate depression, 15-19 = Moderately severe depression, 20-27 = Severe depression   Psychosocial Evaluation and Intervention:   Psychosocial Re-Evaluation:  Psychosocial Re-Evaluation    Row Name 11/14/20 1307 12/01/20 0930           Psychosocial Re-Evaluation   Current issues with None Identified None Identified      Comments No psychosocial concerns identified at this time. No barriers or psychosocial concerns identified at this time.      Expected Outcomes For Emari to be free of psychosocial concerns while participating in pulmonary rehab. For Mickey to continue to be free of psychosocial concerns while participating in pulmonary rehab.      Interventions Encouraged to attend Pulmonary  Rehabilitation for the exercise --      Continue Psychosocial Services  No Follow up required --             Psychosocial Discharge (Final Psychosocial Re-Evaluation):  Psychosocial Re-Evaluation - 12/01/20 0930  Psychosocial Re-Evaluation   Current issues with None Identified    Comments No barriers or psychosocial concerns identified at this time.    Expected Outcomes For Isadore to continue to be free of psychosocial concerns while participating in pulmonary rehab.           Education: Education Goals: Education classes will be provided on a weekly basis, covering required topics. Participant will state understanding/return demonstration of topics presented.  Learning Barriers/Preferences:  Learning Barriers/Preferences - 11/04/20 1014      Learning Barriers/Preferences   Learning Barriers None    Learning Preferences Verbal Instruction;Group Instruction;Written Material           Education Topics: Risk Factor Reduction:  -Group instruction that is supported by a PowerPoint presentation. Instructor discusses the definition of a risk factor, different risk factors for pulmonary disease, and how the heart and lungs work together.     Nutrition for Pulmonary Patient:  -Group instruction provided by PowerPoint slides, verbal discussion, and written materials to support subject matter. The instructor gives an explanation and review of healthy diet recommendations, which includes a discussion on weight management, recommendations for fruit and vegetable consumption, as well as protein, fluid, caffeine, fiber, sodium, sugar, and alcohol. Tips for eating when patients are short of breath are discussed.   Pursed Lip Breathing:  -Group instruction that is supported by demonstration and informational handouts. Instructor discusses the benefits of pursed lip and diaphragmatic breathing and detailed demonstration on how to preform both.     Oxygen Safety:  -Group instruction  provided by PowerPoint, verbal discussion, and written material to support subject matter. There is an overview of "What is Oxygen" and "Why do we need it".  Instructor also reviews how to create a safe environment for oxygen use, the importance of using oxygen as prescribed, and the risks of noncompliance. There is a brief discussion on traveling with oxygen and resources the patient may utilize.   Oxygen Equipment:  -Group instruction provided by [facility] Staff utilizing handouts, written materials, and equipment demonstrations.   Signs and Symptoms:  -Group instruction provided by written material and verbal discussion to support subject matter. Warning signs and symptoms of infection, stroke, and heart attack are reviewed and when to call the physician/911 reinforced. Tips for preventing the spread of infection discussed.   Advanced Directives:  -Group instruction provided by verbal instruction and written material to support subject matter. Instructor reviews Advanced Directive laws and proper instruction for filling out document.   Pulmonary Video:  -Group video education that reviews the importance of medication and oxygen compliance, exercise, good nutrition, pulmonary hygiene, and pursed lip and diaphragmatic breathing for the pulmonary patient.   Exercise for the Pulmonary Patient:  -Group instruction that is supported by a PowerPoint presentation. Instructor discusses benefits of exercise, core components of exercise, frequency, duration, and intensity of an exercise routine, importance of utilizing pulse oximetry during exercise, safety while exercising, and options of places to exercise outside of rehab.     Pulmonary Medications:  -Verbally interactive group education provided by instructor with focus on inhaled medications and proper administration. Flowsheet Row PULMONARY REHAB OTHER RESPIRATORY from 12/01/2020 in [place]  Date 12/01/20   Educator Handout      Anatomy and Physiology of the Respiratory System and Intimacy:  -Group instruction provided by PowerPoint, verbal discussion, and written material to support subject matter. Instructor reviews respiratory cycle and anatomical components of the respiratory system and their functions. Instructor  also reviews differences in obstructive and restrictive respiratory diseases with examples of each. Intimacy, Sex, and Sexuality differences are reviewed with a discussion on how relationships can change when diagnosed with pulmonary disease. Common sexual concerns are reviewed.   MD DAY -A group question and answer session with a medical doctor that allows participants to ask questions that relate to their pulmonary disease state.   OTHER EDUCATION -Group or individual verbal, written, or video instructions that support the educational goals of the pulmonary rehab program. [place] from 12/01/2020 in [place]  Date 11/10/20  Educator --  Willodean Rosenthal reading handout]      Holiday Eating Survival Tips:  -Group instruction provided by PowerPoint slides, verbal discussion, and written materials to support subject matter. The instructor gives patients tips, tricks, and techniques to help them not only survive but enjoy the holidays despite the onslaught of food that accompanies the holidays.   Knowledge Questionnaire Score:  Knowledge Questionnaire Score - 11/04/20 1157      Knowledge Questionnaire Score   Pre Score 6/18           Core Components/Risk Factors/Patient Goals at Admission:  Personal Goals and Risk Factors at Admission - 11/04/20 1015      Core Components/Risk Factors/Patient Goals on Admission    Weight Management Weight Loss    Improve shortness of breath with ADL's Yes    Intervention Provide education, individualized exercise plan and daily activity instruction to help decrease  symptoms of SOB with activities of daily living.    Expected Outcomes Short Term: Improve cardiorespiratory fitness to achieve a reduction of symptoms when performing ADLs;Long Term: Be able to perform more ADLs without symptoms or delay the onset of symptoms           Core Components/Risk Factors/Patient Goals Review:   Goals and Risk Factor Review    Row Name 11/14/20 1309 12/01/20 0934           Core Components/Risk Factors/Patient Goals Review   Personal Goals Review Improve shortness of breath with ADL's;Increase knowledge of respiratory medications and ability to use respiratory devices properly.;Develop more efficient breathing techniques such as purse lipped breathing and diaphragmatic breathing and practicing self-pacing with activity. Improve shortness of breath with ADL's;Increase knowledge of respiratory medications and ability to use respiratory devices properly.;Develop more efficient breathing techniques such as purse lipped breathing and diaphragmatic breathing and practicing self-pacing with activity.      Review Griselda just started the program, has attended 2 exercise sessions, too early to have made progress towards program goals. Nissim has attended 5 exercise sessions and has increased to level 3 on the nustep and level 1 on the bike.  He is very fragile and weak and slow to progress due to his pulmonary hypertension and heart failure.      Expected Outcomes Demontray will feel stronger in strength and stamina in the next full 30 days due to consistently exercising in pulmonary reha. For Welles to at least maintain his level of fitness and hopefully continue to progress.             Core Components/Risk Factors/Patient Goals at Discharge (Final Review):   Goals and Risk Factor Review - 12/01/20 0934      Core Components/Risk Factors/Patient Goals Review   Personal Goals Review Improve shortness of breath with ADL's;Increase knowledge of respiratory medications and ability  to use respiratory devices properly.;Develop more efficient breathing techniques such as purse  lipped breathing and diaphragmatic breathing and practicing self-pacing with activity.    Review Garrison has attended 5 exercise sessions and has increased to level 3 on the nustep and level 1 on the bike.  He is very fragile and weak and slow to progress due to his pulmonary hypertension and heart failure.    Expected Outcomes For Raylen to at least maintain his level of fitness and hopefully continue to progress.           ITP Comments:   Comments: ITP REVIEW Pt is making expected progress toward pulmonary rehab goals after completing 6 sessions. Recommend continued exercise, life style modification, education, and utilization of breathing techniques to increase stamina and strength and decrease shortness of breath with exertion.

## 2020-12-06 NOTE — Progress Notes (Signed)
Daily Session Note  Patient Details  Name: Jesus Russell MRN: [id number] Date of Birth: 10-16-1951 Referring Provider:   April Manson Pulmonary Rehab Walk Test from 11/04/2020 in [place]  Referring Provider Dr. Aundra Dubin      Encounter Date: 12/06/2020  Check In:  Session Check In - 12/06/20 1314      Check-In   Supervising physician immediately available to respond to emergencies [facility] immediately available    Physician(s) Dr. Cyndia Skeeters    Location [facility]    Staff Present Rosebud Poles, RN, BSN;Lisa Ysidro Evert, RN;Ramiah Helfrich Hassell Done, MS, ACSM-CEP, Exercise Physiologist    Virtual Visit No    Medication changes reported     No    Fall or balance concerns reported    No    Tobacco Cessation No Change    Warm-up and Cool-down Performed on first and last piece of equipment    Resistance Training Performed Yes    VAD Patient? No    PAD/SET Patient? No      Pain Assessment   Currently in Pain? No/denies    Multiple Pain Sites No           Capillary Blood Glucose: No results found for this or any previous visit (from the past 24 hour(s)).   Exercise Prescription Changes - 12/06/20 1400      Response to Exercise   Blood Pressure (Admit) 114/60    Blood Pressure (Exercise) 146/80    Blood Pressure (Exit) 100/68    Heart Rate (Admit) 99 bpm    Heart Rate (Exercise) 118 bpm    Heart Rate (Exit) 101 bpm    Oxygen Saturation (Admit) 88 %    Oxygen Saturation (Exercise) 88 %    Oxygen Saturation (Exit) 98 %    Rating of Perceived Exertion (Exercise) 13    Perceived Dyspnea (Exercise) 3    Duration Progress to 45 minutes of aerobic exercise without signs/symptoms of physical distress    Intensity THRR unchanged      Progression   Progression Continue to progress workloads to maintain intensity without signs/symptoms of physical distress.      Resistance Training   Training Prescription Yes    Weight Blue bands     Reps 10-15    Time 10 Minutes      Oxygen   Oxygen Continuous    Liters 3      Recumbant Bike   Level 1.2    Minutes 15    METs 1.5      NuStep   Level 3    SPM 80    Minutes 15    METs 1.7           Social History   Tobacco Use  Smoking Status Former Smoker  . Packs/day: 1.50  . Years: 23.00  . Pack years: 34.50  . Types: Cigarettes  . Quit date: 41  . Years since quitting: 26.9  Smokeless Tobacco Never Used    Goals Met:  Proper associated with RPD/PD & O2 Sat Exercise tolerated well No report of cardiac concerns or symptoms Strength training completed today  Goals Unmet:  Not Applicable  Comments: Service time is from 1300 to 1415    Dr. Fransico Him is Medical Director for Cardiac Rehab at [facility].

## 2020-12-08 ENCOUNTER — Encounter (HOSPITAL_COMMUNITY)
Admission: RE | Admit: 2020-12-08 | Discharge: 2020-12-08 | Disposition: A | Discharge status: Home / Self Care | Payer: Medicare Other | Source: Ambulatory Visit | Attending: Cardiology | Admitting: Cardiology

## 2020-12-08 ENCOUNTER — Encounter (HOSPITAL_COMMUNITY): Payer: Medicare Other

## 2020-12-08 ENCOUNTER — Other Ambulatory Visit: Payer: Self-pay

## 2020-12-08 DIAGNOSIS — I272 Pulmonary hypertension, unspecified: Secondary | ICD-10-CM | POA: Diagnosis not present

## 2020-12-08 NOTE — Progress Notes (Signed)
Nutrition Note Follow up  Spoke with pt. Noted Recent K+ lab 3.3 mmol/L. Pt aware and has adjusted potassium supplement timing.  INR 2.1  Pt was instructed by MD to increase Vitamin K rich foods.  Pt continues eating Vitamin K rich foods inconsistently. We reviewed consistent daily intake.  Keeping daily weight log.  Pt continues to work on weight loss. He has lost 4 lbs in past 4 weeks. He continues to work toward goal weight of 170-180 lbs. Reviewed weight loss tips.  Will continue to monitor pt during pulmonary rehab.

## 2020-12-08 NOTE — Progress Notes (Signed)
Daily Session Note  Patient Details  Name: JDEN WANT MRN: [id number] Date of Birth: 08/19/1951 Referring Provider:   April Manson Pulmonary Rehab Walk Test from 11/04/2020 in [place]  Referring Provider Dr. Aundra Dubin      Encounter Date: 12/08/2020  Check In:  Session Check In - 12/08/20 1355      Check-In   Supervising physician immediately available to respond to emergencies [facility] immediately available    Physician(s) Dr. Algis Liming    Location [facility]    Staff Present Rosebud Poles, RN, BSN;Kayliah Tindol Ysidro Evert, RN;Jessica Hassell Done, MS, ACSM-CEP, Exercise Physiologist    Virtual Visit No    Medication changes reported     No    Fall or balance concerns reported    No    Tobacco Cessation No Change    Warm-up and Cool-down Performed on first and last piece of equipment    Resistance Training Performed Yes    VAD Patient? No    PAD/SET Patient? No      Pain Assessment   Currently in Pain? No/denies    Pain Score 0-No pain    Multiple Pain Sites No           Capillary Blood Glucose: No results found for this or any previous visit (from the past 24 hour(s)).    Social History   Tobacco Use  Smoking Status Former Smoker  . Packs/day: 1.50  . Years: 23.00  . Pack years: 34.50  . Types: Cigarettes  . Quit date: 62  . Years since quitting: 26.9  Smokeless Tobacco Never Used    Goals Met:  Exercise tolerated well No report of cardiac concerns or symptoms Strength training completed today  Goals Unmet:  Not Applicable  Comments: Service time is from 1300 to 1403   Dr. Fransico Him is Medical Director for Cardiac Rehab at [facility].

## 2020-12-12 ENCOUNTER — Ambulatory Visit (INDEPENDENT_AMBULATORY_CARE_PROVIDER_SITE_OTHER): Payer: Medicare Other | Admitting: Pharmacist

## 2020-12-12 DIAGNOSIS — Z86711 Personal history of pulmonary embolism: Secondary | ICD-10-CM

## 2020-12-12 DIAGNOSIS — Z86718 Personal history of other venous thrombosis and embolism: Secondary | ICD-10-CM

## 2020-12-12 DIAGNOSIS — Z7901 Long term (current) use of anticoagulants: Secondary | ICD-10-CM

## 2020-12-12 LAB — POCT INR: INR: 2.7 (ref 2.0–3.0)

## 2020-12-12 NOTE — Patient Instructions (Signed)
Patient instructed to take medications as defined in the Anti-coagulation Track section of this encounter.  Patient instructed to take today's dose.  Patient instructed to take one of your 73m strength blue warfarin tablets, by mouth, once-daily at 6PM-EXCEPT on Mondays and Thursdays, take 1&1/2 tablets on Tuesdays and Thursdays.  Patient verbalized understanding of these instructions.

## 2020-12-12 NOTE — Progress Notes (Signed)
Anticoagulation Management Jesus Russell is a 69 y.o. male who reports to the clinic for monitoring of warfarin treatment.    Indication: DVT and PE, History of (resolved); PE with infarction; Long term current use of oral anticoagulant, warfarin, with INR goal 2.0 - 3.0.  Duration: indefinite Supervising physician: Joni Reining  Anticoagulation Clinic Visit History: Patient does not report signs/symptoms of bleeding or thromboembolism  Other recent changes: No diet, medications, lifestyle changes endorsed. Anticoagulation Episode Summary    Current INR goal:  2.0-3.0  TTR:  63.5 % (1.1 y)  Next INR check:  01/16/2021  INR from last check:  2.7 (12/12/2020)  Weekly max warfarin dose:    Target end date:  Indefinite  INR check location:    Preferred lab:    Send INR reminders to:     Indications   Long term (current) use of anticoagulants [Z79.01] History of DVT (deep vein thrombosis) [Z86.718] Pulmonary embolism on long-term anticoagulation therapy (HCC) [I26.99 T45.515A] History of pulmonary embolism [Z86.711]       Comments:          No Known Allergies  Current Outpatient Medications:  .  albuterol (VENTOLIN HFA) 108 (90 Base) MCG/ACT inhaler, Inhale 2 puffs into the lungs every 6 (six) hours as needed for wheezing or shortness of breath., Disp: 18 g, Rfl: 2 .  alendronate (FOSAMAX) 70 MG tablet, Take 1 tablet (70 mg total) by mouth every 7 (seven) days. Take with a full glass of water on an empty stomach. (Patient taking differently: Take 70 mg by mouth every 7 (seven) days. Take with a full glass of water on an empty stomach. Monday), Disp: 12 tablet, Rfl: 3 .  Ascorbic Acid (VITAMIN C) 500 MG CAPS, Take 500 mg by mouth daily., Disp: , Rfl:  .  cholecalciferol (VITAMIN D3) 25 MCG (1000 UT) tablet, Take 1,000 Units by mouth daily., Disp: , Rfl:  .  omega-3 acid ethyl esters (LOVAZA) 1 g capsule, TAKE 1 CAPSULE(1 GRAM TOTAL) BY MOUTH TWICE DAILY, Disp: 180 capsule, Rfl:  0 .  potassium chloride SA (KLOR-CON) 20 MEQ tablet, Take 4 tablets (80 mEq total) by mouth daily., Disp: 120 tablet, Rfl: 11 .  Riociguat (ADEMPAS) 2.5 MG TABS, Take 2.5 mg by mouth in the morning, at noon, and at bedtime., Disp: 90 tablet, Rfl: 11 .  torsemide (DEMADEX) 20 MG tablet, Take 4 tablets (80 mg total) by mouth every other day., Disp: 60 tablet, Rfl: 3 .  torsemide (DEMADEX) 20 MG tablet, Take 3 tablets (60 mg total) by mouth every other day., Disp: 45 tablet, Rfl: 3 .  TRELEGY ELLIPTA 200-62.5-25 MCG/INH AEPB, INHALE 1 PUFF INTO THE LUNGS DAILY, Disp: 60 each, Rfl: 2 .  Treprostinil (TYVASO) 0.6 MG/ML SOLN, Inhale 18 mcg into the lungs as directed., Disp: , Rfl:  .  warfarin (COUMADIN) 4 MG tablet, TAKE 1  TABLET BY MOUTH ONCE DAILY, EXCEPT ON MONDAYS, TAKE ONLY 1/2 TABLET ON MONDAYS., Disp: 84 tablet, Rfl: 1 .  zinc gluconate 50 MG tablet, Take 50 mg by mouth daily., Disp: , Rfl:  Past Medical History:  Diagnosis Date  . Acute on chronic respiratory failure with hypoxia ([place])   . COVID-19 virus infection   . DVT of lower extremity, bilateral ([place])   . Epistaxis   . Hypertension   . Incarcerated umbilical hernia [id number]/6760   Status post repair  . Lupus anticoagulant positive    Per records from [facility]  . 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 Pulmonary artery hypertension ([place]) 04/2011   Mildly elevated pulmonary artery pressure in setting of PE (from [facility])  . Pulmonary embolism associated with COVID-19 ([place])   . Subdural hematoma (HCC)    Remote episode, occurred when taking excedrin and warfarin.    Social History   Socioeconomic History  . Marital status: Divorced    Spouse name: Not on file  . Number of children: Not on file  . Years of education: Not on file  . Highest education level: Not on file  Occupational History  . Occupation: retired Pharmacist, hospital  Tobacco Use  . Smoking status:  Former Smoker    Packs/day: 1.50    Years: 23.00    Pack years: 34.50    Types: Cigarettes    Quit date: 1995    Years since quitting: 26.9  . Smokeless tobacco: Never Used  Vaping Use  . Vaping Use: Never used  Substance and Sexual Activity  . Alcohol use: Never  . Drug use: Not Currently    Types: "Crack" cocaine    Comment: quit 2008  . Sexual activity: Not on file  Other Topics Concern  . Not on file  Social History Narrative  . Not on file   Social Determinants of Health   Financial Resource Strain: Not on file  Food Insecurity: Not on file  Transportation Needs: Not on file  Physical Activity: Not on file  Stress: Not on file  Social Connections: Not on file   Family History  Adopted: Yes    ASSESSMENT Recent Results: The most recent result is correlated with 32 mg per week: Lab Results  Component Value Date   INR 2.7 12/12/2020   INR 2.1 11/14/2020   INR 1.5 (A) 11/01/2020    Anticoagulation Dosing: Description   Take one of your 72m strength blue warfarin tablets, by mouth, once-daily at 6PM-EXCEPT on Mondays and Thursdays, take 1&1/2 tablets on Tuesdays and Thursdays.      INR today: Therapeutic  PLAN Weekly dose was unchanged   Patient Instructions  Patient instructed to take medications as defined in the Anti-coagulation Track section of this encounter.  Patient instructed to take today's dose.  Patient instructed to take one of your 457mstrength blue warfarin tablets, by mouth, once-daily at 6PM-EXCEPT on Mondays and Thursdays, take 1&1/2 tablets on Tuesdays and Thursdays.  Patient verbalized understanding of these instructions.    Patient advised to contact clinic or seek medical attention if signs/symptoms of bleeding or thromboembolism occur.  Patient verbalized understanding by repeating back information and was advised to contact me if further medication-related questions arise. Patient was also provided an information  handout.  Follow-up Return in 6 weeks (on 01/23/2021) for Follow up INR.  JaPennie BanterPharmD, CPP  15 minutes spent face-to-face with the patient during the encounter. 50% of time spent on education, including signs/sx bleeding and clotting, as well as food and drug interactions with warfarin. 50% of time was spent on fingerprick POC INR sample collection,processing, results determination, and documentation in [email or web address]

## 2020-12-13 ENCOUNTER — Encounter (HOSPITAL_COMMUNITY): Payer: Medicare Other

## 2020-12-13 ENCOUNTER — Encounter (HOSPITAL_COMMUNITY)
Admission: RE | Admit: 2020-12-13 | Discharge: 2020-12-13 | Disposition: A | Discharge status: Home / Self Care | Payer: Medicare Other | Source: Ambulatory Visit | Attending: Cardiology | Admitting: Cardiology

## 2020-12-13 ENCOUNTER — Other Ambulatory Visit: Payer: Self-pay

## 2020-12-13 DIAGNOSIS — I272 Pulmonary hypertension, unspecified: Secondary | ICD-10-CM

## 2020-12-13 NOTE — Progress Notes (Signed)
Daily Session Note  Patient Details  Name: Jesus Russell MRN: [id number] Date of Birth: Nov 26, 1951 Referring Provider:   April Manson Pulmonary Rehab Walk Test from 11/04/2020 in [place]  Referring Provider Dr. Aundra Dubin      Encounter Date: 12/13/2020  Check In:  Session Check In - 12/13/20 1335      Check-In   Supervising physician immediately available to respond to emergencies [facility] immediately available    Physician(s) Dr. Cruzita Lederer    Location [facility]    Staff Present Rosebud Poles, RN, BSN;Lisa Ysidro Evert, RN;Lorrine Killilea Hassell Done, MS, ACSM-CEP, Exercise Physiologist    Virtual Visit No    Medication changes reported     No    Fall or balance concerns reported    No    Tobacco Cessation No Change    Warm-up and Cool-down Performed on first and last piece of equipment    Resistance Training Performed Yes    VAD Patient? No    PAD/SET Patient? No      Pain Assessment   Currently in Pain? No/denies    Multiple Pain Sites No           Capillary Blood Glucose: Results for orders placed or performed in visit on 12/12/20 (from the past 24 hour(s))  POCT INR     Status: None   Collection Time: 12/12/20  3:35 PM  Result Value Ref Range   INR 2.7 2.0 - 3.0      Social History   Tobacco Use  Smoking Status Former Smoker  . Packs/day: 1.50  . Years: 23.00  . Pack years: 34.50  . Types: Cigarettes  . Quit date: 39  . Years since quitting: 26.9  Smokeless Tobacco Never Used    Goals Met:  Proper associated with RPD/PD & O2 Sat Exercise tolerated well No report of cardiac concerns or symptoms Strength training completed today  Goals Unmet:  Not Applicable  Comments: Service time is from 1305 to 1410    Dr. Fransico Him is Medical Director for Cardiac Rehab at [facility].

## 2020-12-15 ENCOUNTER — Encounter (HOSPITAL_COMMUNITY): Payer: Medicare Other

## 2020-12-20 ENCOUNTER — Encounter (HOSPITAL_COMMUNITY): Payer: Medicare Other

## 2020-12-20 ENCOUNTER — Other Ambulatory Visit: Payer: Self-pay

## 2020-12-20 ENCOUNTER — Encounter (HOSPITAL_COMMUNITY)
Admission: RE | Admit: 2020-12-20 | Discharge: 2020-12-20 | Disposition: A | Discharge status: Home / Self Care | Payer: Medicare Other | Source: Ambulatory Visit | Attending: Cardiology | Admitting: Cardiology

## 2020-12-20 VITALS — Wt 194.7 lb

## 2020-12-20 DIAGNOSIS — I272 Pulmonary hypertension, unspecified: Secondary | ICD-10-CM

## 2020-12-20 NOTE — Progress Notes (Signed)
Daily Session Note  Patient Details  Name: Jesus Russell MRN: [id number] Date of Birth: [birth year] Referring Provider:   April Manson Pulmonary Rehab Walk Test from 11/04/2020 in [place]  Referring Provider Dr. Aundra Dubin      Encounter Date: 12/20/2020  Check In:  Session Check In - 12/20/20 1325      Check-In   Supervising physician immediately available to respond to emergencies [facility] immediately available    Physician(s) Dr. Alfredia Ferguson    Location [facility]    Staff Present Rosebud Poles, RN, BSN;Lisa Ysidro Evert, RN;Jessica Hassell Done, MS, ACSM-CEP, Exercise Physiologist    Virtual Visit No    Medication changes reported     No    Fall or balance concerns reported    No    Tobacco Cessation No Change    Warm-up and Cool-down Performed on first and last piece of equipment    Resistance Training Performed Yes    VAD Patient? No    PAD/SET Patient? No      Pain Assessment   Currently in Pain? No/denies    Multiple Pain Sites No           Capillary Blood Glucose: No results found for this or any previous visit (from the past 24 hour(s)).   Exercise Prescription Changes - 12/20/20 1500      Response to Exercise   Blood Pressure (Admit) 102/58    Blood Pressure (Exercise) 120/60    Blood Pressure (Exit) 102/56    Heart Rate (Admit) 90 bpm    Heart Rate (Exercise) 101 bpm    Heart Rate (Exit) 92 bpm    Oxygen Saturation (Admit) 90 %    Oxygen Saturation (Exercise) 96 %    Oxygen Saturation (Exit) 97 %    Rating of Perceived Exertion (Exercise) 13    Perceived Dyspnea (Exercise) 3    Duration Progress to 45 minutes of aerobic exercise without signs/symptoms of physical distress    Intensity THRR unchanged      Progression   Progression Continue to progress workloads to maintain intensity without signs/symptoms of physical distress.      Resistance Training   Training Prescription Yes    Weight Blue bands     Reps 10-15    Time 10 Minutes      Oxygen   Oxygen Continuous    Liters 3      NuStep   Level 3    SPM 80    Minutes 30    METs 1.9           Social History   Tobacco Use  Smoking Status Former Smoker  . Packs/day: 1.50  . Years: 23.00  . Pack years: 34.50  . Types: Cigarettes  . Quit date: 110  . Years since quitting: 27.0  Smokeless Tobacco Never Used    Goals Met:  Proper associated with RPD/PD & O2 Sat Exercise tolerated well Strength training completed today  Goals Unmet:  Not Applicable  Comments: Service time is from 1305 to 1417.    Dr. Fransico Him is Medical Director for Cardiac Rehab at [facility].

## 2020-12-22 ENCOUNTER — Encounter (HOSPITAL_COMMUNITY): Payer: Medicare Other

## 2020-12-22 ENCOUNTER — Other Ambulatory Visit: Payer: Self-pay

## 2020-12-22 ENCOUNTER — Encounter (HOSPITAL_COMMUNITY)
Admission: RE | Admit: 2020-12-22 | Discharge: 2020-12-22 | Disposition: A | Discharge status: Home / Self Care | Payer: Medicare Other | Source: Ambulatory Visit | Attending: Cardiology | Admitting: Cardiology

## 2020-12-22 DIAGNOSIS — I272 Pulmonary hypertension, unspecified: Secondary | ICD-10-CM | POA: Diagnosis not present

## 2020-12-22 NOTE — Progress Notes (Signed)
Daily Session Note  Patient Details  Name: Jesus Russell MRN: [id number] Date of Birth: [birth year] Referring Provider:   April Manson Pulmonary Rehab Walk Test from 11/04/2020 in [place]  Referring Provider Dr. Aundra Dubin      Encounter Date: 12/22/2020  Check In:  Session Check In - 12/22/20 1327      Check-In   Supervising physician immediately available to respond to emergencies [facility] immediately available    Physician(s) Dr. Florene Glen    Location [facility]    Staff Present Rosebud Poles, RN, BSN;Lisa Ysidro Evert, RN;Sonora Catlin Hassell Done, MS, ACSM-CEP, Exercise Physiologist    Virtual Visit No    Medication changes reported     No    Fall or balance concerns reported    No    Tobacco Cessation No Change    Warm-up and Cool-down Performed on first and last piece of equipment    Resistance Training Performed Yes    VAD Patient? No    PAD/SET Patient? No      Pain Assessment   Currently in Pain? No/denies    Multiple Pain Sites No           Capillary Blood Glucose: No results found for this or any previous visit (from the past 24 hour(s)).    Social History   Tobacco Use  Smoking Status Former Smoker  . Packs/day: 1.50  . Years: 23.00  . Pack years: 34.50  . Types: Cigarettes  . Quit date: 21  . Years since quitting: 27.0  Smokeless Tobacco Never Used    Goals Met:  Proper associated with RPD/PD & O2 Sat Independence with exercise equipment Exercise tolerated well No report of cardiac concerns or symptoms Strength training completed today  Goals Unmet:  Not Applicable  Comments: Service time is from 1300 to 1410    Dr. Fransico Him is Medical Director for Cardiac Rehab at [facility].

## 2020-12-22 NOTE — Progress Notes (Signed)
I have reviewed a Home Exercise Prescription with Andria Rhein . Helix is currently exercising at home, but not on a regular basis. He states he does the resistance bands occassionally. The patient was advised to do resistance bands and walk at least 2 days a week for 30 minutes.  Mann and I discussed how to progress their exercise prescription.  The patient stated that their goals were to be able to do activities without as much shortness of breath. The patient stated that they understand the exercise prescription.  We reviewed exercise guidelines, target heart rate during exercise, RPE Scale, weather conditions, Rescue Inhaler use, endpoints for exercise, warmup and cool down.  Patient is encouraged to come to me with any questions. I will continue to follow up with the patient to assist them with progression and safety.   Rick Duff MS, ACSM CEP 2:54 PM 12/22/2020

## 2020-12-27 ENCOUNTER — Encounter (HOSPITAL_COMMUNITY)
Admission: RE | Admit: 2020-12-27 | Discharge: 2020-12-27 | Disposition: A | Discharge status: Home / Self Care | Payer: Medicare Other | Source: Ambulatory Visit | Attending: Cardiology | Admitting: Cardiology

## 2020-12-27 ENCOUNTER — Encounter (HOSPITAL_COMMUNITY): Payer: Medicare Other

## 2020-12-27 ENCOUNTER — Other Ambulatory Visit: Payer: Self-pay

## 2020-12-27 DIAGNOSIS — I272 Pulmonary hypertension, unspecified: Secondary | ICD-10-CM | POA: Insufficient documentation

## 2020-12-27 NOTE — Progress Notes (Signed)
Daily Session Note  Patient Details  Name: Jesus Russell MRN: [id number] Date of Birth: October 06, 1951 Referring Provider:   April Manson Pulmonary Rehab Walk Test from 11/04/2020 in [place]  Referring Provider Dr. Aundra Dubin      Encounter Date: 12/27/2020  Check In:  Session Check In - 12/27/20 1336      Check-In   Supervising physician immediately available to respond to emergencies [facility] immediately available    Physician(s) Dr. Florene Glen    [facility]    Staff Present Rosebud Poles, RN, BSN;Lisa Ysidro Evert, RN;Jessica Hassell Done, MS, ACSM-CEP, Exercise Physiologist    Virtual Visit No    Medication changes reported     No    Fall or balance concerns reported    No    Tobacco Cessation No Change    Warm-up and Cool-down Performed on first and last piece of equipment    Resistance Training Performed Yes    VAD Patient? No    PAD/SET Patient? No      Pain Assessment   Currently in Pain? No/denies    Multiple Pain Sites No           Capillary Blood Glucose: No results found for this or any previous visit (from the past 24 hour(s)).    Social History   Tobacco Use  Smoking Status Former Smoker  . Packs/day: 1.50  . Years: 23.00  . Pack years: 34.50  . Types: Cigarettes  . Quit date: 23  . Years since quitting: 27.0  Smokeless Tobacco Never Used    Goals Met:  Proper associated with RPD/PD & O2 Sat Exercise tolerated well Strength training completed today  Goals Unmet:  Not Applicable  Comments: Service time is from 1310 to [place]    Dr. Fransico Him is Medical Director for Cardiac Rehab at [facility].

## 2020-12-29 ENCOUNTER — Other Ambulatory Visit: Payer: Self-pay

## 2020-12-29 ENCOUNTER — Encounter (HOSPITAL_COMMUNITY): Payer: Medicare Other

## 2020-12-29 ENCOUNTER — Encounter (HOSPITAL_COMMUNITY)
Admission: RE | Admit: 2020-12-29 | Discharge: 2020-12-29 | Disposition: A | Discharge status: Home / Self Care | Payer: Medicare Other | Source: Ambulatory Visit | Attending: Cardiology | Admitting: Cardiology

## 2020-12-29 DIAGNOSIS — I272 Pulmonary hypertension, unspecified: Secondary | ICD-10-CM

## 2020-12-29 NOTE — Progress Notes (Signed)
Daily Session Note  Patient Details  Name: Jesus Russell MRN: [id number] Date of Birth: 1951-11-25 Referring Provider:   April Manson Pulmonary Rehab Walk Test from 11/04/2020 in [place]  Referring Provider Dr. Aundra Dubin      Encounter Date: 12/29/2020  Check In:  Session Check In - 12/29/20 1333      Check-In   Supervising physician immediately available to respond to emergencies [facility] immediately available    Physician(s) Dr. Horris Latino    Location [facility]    Staff Present Rosebud Poles, RN, BSN;Metta Koranda Ysidro Evert, RN;Jessica Hassell Done, MS, ACSM-CEP, Exercise Physiologist    Virtual Visit No    Medication changes reported     No    Fall or balance concerns reported    No    Tobacco Cessation No Change    Warm-up and Cool-down Performed on first and last piece of equipment    Resistance Training Performed Yes    VAD Patient? No    PAD/SET Patient? No      Pain Assessment   Currently in Pain? No/denies    Multiple Pain Sites No           Capillary Blood Glucose: No results found for this or any previous visit (from the past 24 hour(s)).    Social History   Tobacco Use  Smoking Status Former Smoker  . Packs/day: 1.50  . Years: 23.00  . Pack years: 34.50  . Types: Cigarettes  . Quit date: 15  . Years since quitting: 27.0  Smokeless Tobacco Never Used    Goals Met:  Exercise tolerated well No report of cardiac concerns or symptoms Strength training completed today  Goals Unmet:  Not Applicable  Comments: Service time is from 1308 to 1407    Dr. Fransico Him is Medical Director for Cardiac Rehab at [facility].

## 2021-01-03 ENCOUNTER — Encounter (HOSPITAL_COMMUNITY): Payer: Medicare Other

## 2021-01-03 ENCOUNTER — Encounter (HOSPITAL_COMMUNITY)
Admission: RE | Admit: 2021-01-03 | Discharge: 2021-01-03 | Disposition: A | Discharge status: Home / Self Care | Payer: Medicare Other | Source: Ambulatory Visit | Attending: Cardiology | Admitting: Cardiology

## 2021-01-03 ENCOUNTER — Other Ambulatory Visit: Payer: Self-pay

## 2021-01-03 DIAGNOSIS — I272 Pulmonary hypertension, unspecified: Secondary | ICD-10-CM

## 2021-01-03 NOTE — Progress Notes (Signed)
Daily Session Note  Patient Details  Name: Jesus Russell MRN: [id number] Date of Birth: 1951-06-24 Referring Provider:   April Manson Pulmonary Rehab Walk Test from 11/04/2020 in [place]  Referring Provider Dr. Aundra Dubin      Encounter Date: 01/03/2021  Check In:  Session Check In - 01/03/21 1321      Check-In   Supervising physician immediately available to respond to emergencies [facility] immediately available    Physician(s) Dr. Horris Latino    Location [facility]    Staff Present Rosebud Poles, RN, Isaac Laud, MS, ACSM-CEP, Exercise Physiologist    Virtual Visit No    Medication changes reported     No    Fall or balance concerns reported    No    Tobacco Cessation No Change    Warm-up and Cool-down Performed on first and last piece of equipment    Resistance Training Performed Yes    VAD Patient? No    PAD/SET Patient? No      Pain Assessment   Currently in Pain? No/denies    Pain Score 0-No pain    Multiple Pain Sites No           Capillary Blood Glucose: No results found for this or any previous visit (from the past 24 hour(s)).   Exercise Prescription Changes - 01/03/21 1400      Response to Exercise   Blood Pressure (Admit) 110/60    Blood Pressure (Exercise) 118/70    Blood Pressure (Exit) 104/60    Heart Rate (Admit) 84 bpm    Heart Rate (Exercise) 103 bpm    Heart Rate (Exit) 91 bpm    Oxygen Saturation (Admit) 94 %    Oxygen Saturation (Exercise) 88 %    Oxygen Saturation (Exit) 97 %    Rating of Perceived Exertion (Exercise) 13    Perceived Dyspnea (Exercise) 1    Duration Continue with 30 min of aerobic exercise without signs/symptoms of physical distress.    Intensity THRR unchanged      Progression   Progression Continue to progress workloads to maintain intensity without signs/symptoms of physical distress.      Resistance Training   Training Prescription Yes    Weight blue  bands    Reps 10-15    Time 10 Minutes      Oxygen   Oxygen Continuous    Liters 4      NuStep   Level 3   level 4 for 5 minutes   SPM 80    Minutes 30    METs 2           Social History   Tobacco Use  Smoking Status Former Smoker  . Packs/day: 1.50  . Years: 23.00  . Pack years: 34.50  . Types: Cigarettes  . Quit date: 35  . Years since quitting: 27.0  Smokeless Tobacco Never Used    Goals Met:  Proper associated with RPD/PD & O2 Sat Exercise tolerated well Strength training completed today  Goals Unmet:  Not Applicable  Comments: Service time is from 1300 to 1407    Dr. Fransico Him is Medical Director for Cardiac Rehab at [facility].

## 2021-01-03 NOTE — Progress Notes (Signed)
Pulmonary Individual Treatment Plan  Patient Details  Name: Jesus Russell MRN: [id number] Date of Birth: 24-Aug-1951 Referring Provider:   April Manson Pulmonary Rehab Walk Test from 11/04/2020 in [facility]  Referring Provider Dr. Aundra Dubin      Initial Encounter Date:  Flowsheet Row Pulmonary Rehab Walk Test from 11/04/2020 in [facility]  Date 11/04/20      Visit Diagnosis: Pulmonary HTN ([place])  Patient's Home Medications on Admission:   Current Outpatient Medications:  .  albuterol (VENTOLIN HFA) 108 (90 Base) MCG/ACT inhaler, Inhale 2 puffs into the lungs every 6 (six) hours as needed for wheezing or shortness of breath., Disp: 18 g, Rfl: 2 .  alendronate (FOSAMAX) 70 MG tablet, Take 1 tablet (70 mg total) by mouth every 7 (seven) days. Take with a full glass of water on an empty stomach. (Patient taking differently: Take 70 mg by mouth every 7 (seven) days. Take with a full glass of water on an empty stomach. Monday), Disp: 12 tablet, Rfl: 3 .  Ascorbic Acid (VITAMIN C) 500 MG CAPS, Take 500 mg by mouth daily., Disp: , Rfl:  .  cholecalciferol (VITAMIN D3) 25 MCG (1000 UT) tablet, Take 1,000 Units by mouth daily., Disp: , Rfl:  .  omega-3 acid ethyl esters (LOVAZA) 1 g capsule, TAKE 1 CAPSULE(1 GRAM TOTAL) BY MOUTH TWICE DAILY, Disp: 180 capsule, Rfl: 0 .  potassium chloride SA (KLOR-CON) 20 MEQ tablet, Take 4 tablets (80 mEq total) by mouth daily., Disp: 120 tablet, Rfl: 11 .  Riociguat (ADEMPAS) 2.5 MG TABS, Take 2.5 mg by mouth in the morning, at noon, and at bedtime., Disp: 90 tablet, Rfl: 11 .  torsemide (DEMADEX) 20 MG tablet, Take 4 tablets (80 mg total) by mouth every other day., Disp: 60 tablet, Rfl: 3 .  torsemide (DEMADEX) 20 MG tablet, Take 3 tablets (60 mg total) by mouth every other day., Disp: 45 tablet, Rfl: 3 .  TRELEGY ELLIPTA 200-62.5-25 MCG/INH AEPB, INHALE 1 PUFF INTO THE LUNGS DAILY, Disp: 60 each,  Rfl: 2 .  Treprostinil (TYVASO) 0.6 MG/ML SOLN, Inhale 18 mcg into the lungs as directed., Disp: , Rfl:  .  warfarin (COUMADIN) 4 MG tablet, TAKE 1  TABLET BY MOUTH ONCE DAILY, EXCEPT ON MONDAYS, TAKE ONLY 1/2 TABLET ON MONDAYS., Disp: 84 tablet, Rfl: 1 .  zinc gluconate 50 MG tablet, Take 50 mg by mouth daily., Disp: , Rfl:   Past Medical History: Past Medical History:  Diagnosis Date  . Acute on chronic respiratory failure with hypoxia ([place])   . COVID-19 virus infection   . DVT of lower extremity, bilateral ([place])   . Epistaxis   . Hypertension   . Incarcerated umbilical hernia 50/0938   Status post repair  . Lupus anticoagulant positive    Per records from [facility]  . 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 Pulmonary artery hypertension ([place]) 04/2011   Mildly elevated pulmonary artery pressure in setting of PE (from [facility])  . Pulmonary embolism associated with COVID-19 ([place])   . Subdural hematoma ([facility])    Remote episode, occurred when taking excedrin and warfarin.     Tobacco Use: Social History   Tobacco Use  Smoking Status Former Smoker  . Packs/day: 1.50  . Years: 23.00  . Pack years: 34.50  . Types: Cigarettes  . Quit date: 53  .  Years since quitting: 27.0  Smokeless Tobacco Never Used    Labs: Recent Chemical engineer    Labs for ITP Cardiac and Pulmonary Rehab Latest Ref Rng & Units 05/20/2020 05/21/2020 05/21/2020 07/06/2020 07/06/2020   Trlycerides <150 mg/dL - - - - -   Hemoglobin A1c 4.8 - 5.6 % - - - - -   PHART 7.350 - 7.450 7.284(L) 7.334(L) 7.385 - -   PCO2ART 32.0 - 48.0 mmHg 69.7(HH) 64.7(H) 54.5(H) - -   HCO3 20.0 - 28.0 mmol/L 33.0(H) 34.6(H) 31.9(H) 41.4(H) 41.0(H)   TCO2 22 - 32 mmol/L 35(H) 37(H) - 43(H) 43(H)   ACIDBASEDEF 0.0 - 2.0 mmol/L - - - - -   O2SAT % 98.0 97.0 97.7 64.0 59.0      Capillary Blood Glucose: Lab Results  Component Value Date   GLUCAP 75  03/01/2020   GLUCAP 78 03/01/2020   GLUCAP 56 (L) 03/01/2020   GLUCAP 76 03/01/2020   GLUCAP 81 02/29/2020     Pulmonary Assessment Scores:  Pulmonary Assessment Scores    Row Name 11/04/20 1155         ADL UCSD   ADL Phase Entry     SOB Score total 79           CAT Score   CAT Score 24           mMRC Score   mMRC Score 4           UCSD: Self-administered rating of dyspnea associated with activities of daily living (ADLs) 6-point scale (0 = "not at all" to 5 = "maximal or unable to do because of breathlessness")  Scoring Scores range from 0 to 120.  Minimally important difference is 5 units  CAT: CAT can identify the health impairment of COPD patients and is better correlated with disease progression.  CAT has a scoring range of zero to 40. The CAT score is classified into four groups of low (less than 10), medium (10 - 20), high (21-30) and very high (31-40) based on the impact level of disease on health status. A CAT score over 10 suggests significant symptoms.  A worsening CAT score could be explained by an exacerbation, poor medication adherence, poor inhaler technique, or progression of COPD or comorbid conditions.  CAT MCID is 2 points  mMRC: mMRC (Modified Medical Research Council) Dyspnea Scale is used to assess the degree of baseline functional disability in patients of respiratory disease due to dyspnea. No minimal important difference is established. A decrease in score of 1 point or greater is considered a positive change.   Pulmonary Function Assessment:  Pulmonary Function Assessment - 11/04/20 1012      Breath   Shortness of Breath Yes;Panic with Shortness of Breath;Limiting activity           Exercise Target Goals: Exercise Program Goal: Individual exercise prescription set using results from initial 6 min walk test and THRR while considering  patient's activity barriers and safety.   Exercise Prescription Goal: Initial exercise prescription  builds to 30-45 minutes a day of aerobic activity, 2-3 days per week.  Home exercise guidelines will be given to patient during program as part of exercise prescription that the participant will acknowledge.  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 HR 118 bpm     Max Ex. BP 120/66     2 Minute Post BP 102/68           Interval HR   1 Minute HR 101     2 Minute HR 100     3 Minute HR 107     4 Minute HR 114     5 Minute HR 116     6 Minute HR 118     2 Minute Post HR 103     Interval Heart Rate? Yes           Interval Oxygen   Interval Oxygen? Yes     Baseline Oxygen Saturation % 97 %     1 Minute Oxygen Saturation % 99 %     1 Minute Liters of Oxygen 2 L     2 Minute Oxygen Saturation % 97 %     2 Minute Liters of Oxygen 2 L     3 Minute Oxygen Saturation % 93 %     3 Minute Liters of Oxygen 2 L     4 Minute Oxygen Saturation % 91 %     4 Minute Liters of Oxygen 2 L     5 Minute Oxygen Saturation % 91 %     5 Minute Liters of Oxygen 2 L     6 Minute Oxygen Saturation % 91 %     6 Minute Liters of Oxygen 2 L     2 Minute Post Oxygen  Saturation % 96 %     2 Minute Post Liters of Oxygen 2 L            Oxygen Initial Assessment:  Oxygen Initial Assessment - 11/04/20 1055      Home Oxygen   Home Oxygen Device Portable Concentrator;Home Concentrator    Sleep Oxygen Prescription CPAP    Home Exercise Oxygen Prescription Pulsed    Liters per minute 2    Home Resting Oxygen Prescription Pulsed    Liters per minute 2    Compliance with Home Oxygen Use Yes      Initial 6 min Walk   Oxygen Used Continuous    Liters per minute 2      Program Oxygen Prescription   Program Oxygen Prescription Continuous    Liters per minute 2      Intervention   Short Term Goals To learn and exhibit compliance with exercise, home and travel O2 prescription;To  learn and understand importance of monitoring SPO2 with pulse oximeter and demonstrate accurate use of the pulse oximeter.;To learn and understand importance of maintaining oxygen saturations>88%;To learn and demonstrate proper pursed lip breathing techniques or other breathing techniques.;To learn and demonstrate proper use of respiratory medications    Long  Term Goals Exhibits compliance with exercise, home and travel O2 prescription;Verbalizes importance of monitoring SPO2 with pulse oximeter and return demonstration;Maintenance of O2 saturations>88%;Exhibits proper breathing techniques, such as pursed lip breathing or other method taught during program session;Compliance with respiratory medication;Demonstrates proper use of MDI's           Oxygen Re-Evaluation:  Oxygen Re-Evaluation    Row Name 11/10/20 0736 12/06/20 0811 01/03/21 0754         Program Oxygen Prescription   Program Oxygen Prescription Continuous Continuous Continuous     Liters per minute _0 Comments - - Had to increase exercise oxygen prescription to 3L due to oxygen saturation dropping below 88% several times.           Home Oxygen   Home Oxygen Device Portable Concentrator;Home  Concentrator Portable Concentrator;Home Concentrator Portable Concentrator;Home Concentrator     Sleep Oxygen Prescription CPAP CPAP CPAP     Liters per minute _1 Home Exercise Oxygen Prescription Pulsed Pulsed Pulsed     Liters per minute _2 Home Resting Oxygen Prescription Pulsed Pulsed Pulsed     Liters per minute _3 Compliance with Home Oxygen Use Yes Yes Yes           Goals/Expected Outcomes   Short Term Goals To learn and exhibit compliance with exercise, home and travel O2 prescription;To learn and understand importance of monitoring SPO2 with pulse oximeter and demonstrate accurate use of the pulse oximeter.;To learn and understand importance of maintaining oxygen saturations>88%;To learn and demonstrate proper pursed lip breathing techniques or other breathing techniques.;To learn and demonstrate proper use of respiratory medications To learn and exhibit compliance with exercise, home and travel O2 prescription;To learn and understand importance of monitoring SPO2 with pulse oximeter and demonstrate accurate use of the pulse oximeter.;To learn and understand importance of maintaining oxygen saturations>88%;To learn and demonstrate proper pursed lip breathing techniques or other breathing techniques.;To learn and demonstrate proper use of respiratory medications To learn and exhibit compliance with exercise, home and travel O2 prescription;To learn and understand importance of monitoring SPO2 with pulse oximeter and demonstrate accurate use of the pulse oximeter.;To learn and understand importance of maintaining oxygen saturations>88%;To learn and demonstrate proper pursed lip breathing techniques or other breathing techniques.;To learn and demonstrate proper use of respiratory medications     Long  Term Goals Exhibits compliance with exercise, home and travel O2 prescription;Verbalizes importance of monitoring SPO2 with pulse oximeter and return demonstration;Maintenance  of O2 saturations>88%;Exhibits proper breathing techniques, such as pursed lip breathing or other method taught during program session;Compliance with respiratory medication;Demonstrates proper use of MDI's Exhibits compliance with exercise, home and travel O2 prescription;Verbalizes importance of monitoring SPO2 with pulse oximeter and return demonstration;Maintenance of O2 saturations>88%;Exhibits proper breathing techniques, such as pursed lip breathing or other method taught during program session;Compliance with respiratory medication;Demonstrates proper use of MDI's Exhibits compliance with exercise, home and travel O2 prescription;Verbalizes importance of monitoring SPO2 with pulse oximeter and return demonstration;Maintenance of O2 saturations>88%;Exhibits proper breathing techniques, such as pursed lip breathing or other method taught during  program session;Compliance with respiratory medication;Demonstrates proper use of MDI's     Goals/Expected Outcomes Compliance and understanding of oxygen saturation and pursed lip breathing Compliance and understanding of oxygen saturation and pursed lip breathing Compliance and understanding of oxygen saturation and pursed lip breathing            Oxygen Discharge (Final Oxygen Re-Evaluation):  Oxygen Re-Evaluation - 01/03/21 0754      Program Oxygen Prescription   Program Oxygen Prescription Continuous    Liters per minute 3    Comments Had to increase exercise oxygen prescription to 3L due to oxygen saturation dropping below 88% several times.      Home Oxygen   Home Oxygen Device Portable Concentrator;Home Concentrator    Sleep Oxygen Prescription CPAP    Liters per minute 4    Home Exercise Oxygen Prescription Pulsed    Liters per minute 3    Home Resting Oxygen Prescription Pulsed    Liters per minute 2    Compliance with Home Oxygen Use Yes      Goals/Expected Outcomes   Short Term Goals To learn and exhibit compliance with exercise,  home and travel O2 prescription;To learn and understand importance of monitoring SPO2 with pulse oximeter and demonstrate accurate use of the pulse oximeter.;To learn and understand importance of maintaining oxygen saturations>88%;To learn and demonstrate proper pursed lip breathing techniques or other breathing techniques.;To learn and demonstrate proper use of respiratory medications    Long  Term Goals Exhibits compliance with exercise, home and travel O2 prescription;Verbalizes importance of monitoring SPO2 with pulse oximeter and return demonstration;Maintenance of O2 saturations>88%;Exhibits proper breathing techniques, such as pursed lip breathing or other method taught during program session;Compliance with respiratory medication;Demonstrates proper use of MDI's    Goals/Expected Outcomes Compliance and understanding of oxygen saturation and pursed lip breathing           Initial Exercise Prescription:  Initial Exercise Prescription - 11/04/20 1200      Date of Initial Exercise RX and Referring Provider   Date 11/04/20    Referring Provider Dr. Aundra Dubin    Expected Discharge Date 01/05/21      Oxygen   Oxygen Continuous    Liters 2      Recumbant Bike   Level 1.5    Minutes 15      NuStep   Level 2    Minutes 15      Prescription Details   Frequency (times per week) 2    Duration Progress to 30 minutes of continuous aerobic without signs/symptoms of physical distress      Intensity   THRR 40-80% of Max Heartrate 61-122    Ratings of Perceived Exertion 11-13    Perceived Dyspnea 0-4      Progression   Progression Continue to progress workloads to maintain intensity without signs/symptoms of physical distress.      Resistance Training   Training Prescription Yes    Weight Blue bands    Reps 10-15           Perform Capillary Blood Glucose checks as needed.  Exercise Prescription Changes:  Exercise Prescription Changes    Row Name 11/08/20 1500 11/22/20 1500  12/06/20 1400 12/20/20 1500 12/22/20 1400     Response to Exercise   Blood Pressure (Admit) 104/60 110/64 114/60 102/58 -   Blood Pressure (Exercise) 130/80 128/72 146/80 120/60 -   Blood Pressure (Exit) 100/70 100/60 100/68 102/56 -   Heart Rate (Admit) 98 bpm 106 bpm  99 bpm 90 bpm -   Heart Rate (Exercise) 115 bpm 120 bpm 118 bpm 101 bpm -   Heart Rate (Exit) 99 bpm 105 bpm 101 bpm 92 bpm -   Oxygen Saturation (Admit) 96 % 91 % 88 % 90 % -   Oxygen Saturation (Exercise) 91 % 90 % 88 % 96 % -   Oxygen Saturation (Exit) 99 % 98 % 98 % 97 % -   Rating of Perceived Exertion (Exercise) _0 -   Perceived Dyspnea (Exercise) _1 -   Symptoms - -  shortness of breath - - -   Duration Continue with 30 min of aerobic exercise without signs/symptoms of physical distress. Progress to 45 minutes of aerobic exercise without signs/symptoms of physical distress Progress to 45 minutes of aerobic exercise without signs/symptoms of physical distress Progress to 45 minutes of aerobic exercise without signs/symptoms of physical distress -   Intensity Other (comment)  40-80% HR Max THRR unchanged THRR unchanged THRR unchanged -     Progression   Progression Continue to progress workloads to maintain intensity without signs/symptoms of physical distress. Continue to progress workloads to maintain intensity without signs/symptoms of physical distress. Continue to progress workloads to maintain intensity without signs/symptoms of physical distress. Continue to progress workloads to maintain intensity without signs/symptoms of physical distress. -     Resistance Training   Training Prescription Yes Yes Yes Yes -   Weight Blue bands Blue bands Blue bands Blue bands -   Reps 10-15 10-15 10-15 10-15 -   Time 10 Minutes 10 Minutes 10 Minutes 10 Minutes -     Oxygen   Oxygen Continuous Continuous Continuous Continuous -   Liters _2 -     Recumbant Bike   Level 1 1 1.2 - -   Minutes _3 - -    METs - 1.6 1.5 - -     NuStep   Level _4 -   SPM 60 60 80 80 -   Minutes _5 -   METs 1.7 1.6 1.7 1.9 -     Home Exercise Plan   Plans to continue exercise at - - - - Home (comment)  Resistance Bands   Frequency - - - - Add 2 additional days to program exercise sessions.   Initial Home Exercises Provided - - - - 12/22/20          Exercise Comments:  Exercise Comments    Row Name 11/08/20 1504 12/22/20 1449         Exercise Comments Pt completed first day of exercise and tolerated well with no complaints or concerns. Completed home exercise with patient. Blane occasionaly does the resistance bands at home. We talked about adding 2 additional days of exercise at home, he agreed to do that on a regular bases. Will continue to monitor home exercise prescription.             Exercise Goals and Review:  Exercise Goals    Row Name 11/04/20 1054             Exercise Goals   Increase Physical Activity Yes       Intervention Provide advice, education, support and counseling about physical activity/exercise needs.;Develop an individualized exercise prescription for aerobic and resistive training based on initial evaluation findings, risk stratification, comorbidities and participant's personal goals.       Expected Outcomes Short Term:  Attend rehab on a regular basis to increase amount of physical activity.;Long Term: Add in home exercise to make exercise part of routine and to increase amount of physical activity.;Long Term: Exercising regularly at least 3-5 days a week.       Increase Strength and Stamina Yes       Intervention Provide advice, education, support and counseling about physical activity/exercise needs.;Develop an individualized exercise prescription for aerobic and resistive training based on initial evaluation findings, risk stratification, comorbidities and participant's personal goals.       Expected Outcomes Short Term: Increase workloads from  initial exercise prescription for resistance, speed, and METs.;Short Term: Perform resistance training exercises routinely during rehab and add in resistance training at home;Long Term: Improve cardiorespiratory fitness, muscular endurance and strength as measured by increased METs and functional capacity (6MWT)       Able to understand and use rate of perceived exertion (RPE) scale Yes       Intervention Provide education and explanation on how to use RPE scale       Expected Outcomes Short Term: Able to use RPE daily in rehab to express subjective intensity level;Long Term:  Able to use RPE to guide intensity level when exercising independently       Able to understand and use Dyspnea scale Yes       Intervention Provide education and explanation on how to use Dyspnea scale       Expected Outcomes Short Term: Able to use Dyspnea scale daily in rehab to express subjective sense of shortness of breath during exertion;Long Term: Able to use Dyspnea scale to guide intensity level when exercising independently       Knowledge and understanding of Target Heart Rate Range (THRR) Yes       Intervention Provide education and explanation of THRR including how the numbers were predicted and where they are located for reference       Expected Outcomes Short Term: Able to state/look up THRR;Long Term: Able to use THRR to govern intensity when exercising independently;Short Term: Able to use daily as guideline for intensity in rehab       Understanding of Exercise Prescription Yes       Intervention Provide education, explanation, and written materials on patient's individual exercise prescription       Expected Outcomes Short Term: Able to explain program exercise prescription;Long Term: Able to explain home exercise prescription to exercise independently              Exercise Goals Re-Evaluation :  Exercise Goals Re-Evaluation    Row Name 11/10/20 0733 12/06/20 0807 01/03/21 0755         Exercise Goal  Re-Evaluation   Exercise Goals Review Increase Physical Activity;Increase Strength and Stamina;Able to understand and use rate of perceived exertion (RPE) scale;Able to understand and use Dyspnea scale;Knowledge and understanding of Target Heart Rate Range (THRR);Understanding of Exercise Prescription Increase Physical Activity;Increase Strength and Stamina;Able to understand and use rate of perceived exertion (RPE) scale;Able to understand and use Dyspnea scale;Knowledge and understanding of Target Heart Rate Range (THRR);Understanding of Exercise Prescription Increase Physical Activity;Increase Strength and Stamina;Able to understand and use rate of perceived exertion (RPE) scale;Able to understand and use Dyspnea scale;Knowledge and understanding of Target Heart Rate Range (THRR);Understanding of Exercise Prescription     Comments Pt has completed 1 exercise session and tolerated well with no complaints or concerns. He is exercising at 1.7 METS on the Nustep. Will continue to monitor and progress  as he is able. Jashad has completed 6 exercise sessions. He experiences mild SOB with exertion and has to take a couple of breaks in between exercise equipment. He also struggles on the recumbent bike due to his stomach getting in the way of his range of motion. Otherwise he has tolerated well. He has been a little slow with progressions due to complaints listed above. He is exercising at 1.9 METS on the Nustep and 1.6 METS on the bike. Will continue to monitor and progress as he is able. Phillippe has completed 13 exercise sessions and has made improvements with workload increases and METS. Keveon is not able to do 30 minutes straight of exercise on the NuStep. He was having to take a couple of breaks during the 30 minutes due to shortness of breath. He states he can tell a slight decrease of shortness of breath with activities. He is exercising at 2.0 METS on the Nustep and is completely independent with resistance  bands and cool down stretches. Will continue to monitor and progress as he is able.     Expected Outcomes Through exercise at rehab and home the patient will decrease shortness of breath with daily activities and feel confident in carrying out an exercise regimn at home. Through exercise at rehab and home the patient will decrease shortness of breath with daily activities and feel confident in carrying out an exercise regimn at home. Through exercise at rehab and home the patient will decrease shortness of breath with daily activities and feel confident in carrying out an exercise regimn at home.            Discharge Exercise Prescription (Final Exercise Prescription Changes):  Exercise Prescription Changes - 12/22/20 1400      Home Exercise Plan   Plans to continue exercise at Home (comment)   Resistance Bands   Frequency Add 2 additional days to program exercise sessions.    Initial Home Exercises Provided 12/22/20           Nutrition:  Target Goals: Understanding of nutrition guidelines, daily intake of sodium <1517m, cholesterol <2025m calories 30% from fat and 7% or less from saturated fats, daily to have 5 or more servings of fruits and vegetables.  Biometrics:  Pre Biometrics - 11/04/20 1130      Pre Biometrics   Grip Strength 17 kg            Nutrition Therapy Plan and Nutrition Goals:  Nutrition Therapy & Goals - 12/08/20 1313      Nutrition Therapy   Diet TLC;low sodium           Nutrition Assessments:  Nutrition Assessments - 01/02/21 0844      Rate Your Plate Scores   Pre Score 55          MEDIFICTS Score Key:  ?70 Need to make dietary changes   40-70 Heart Healthy Diet  ? 40 Therapeutic Level Cholesterol Diet   Picture Your Plate Scores:  <4<81nhealthy dietary pattern with much room for improvement.  41-50 Dietary pattern unlikely to meet recommendations for good health and room for improvement.  51-60 More healthful dietary pattern,  with some room for improvement.   >60 Healthy dietary pattern, although there may be some specific behaviors that could be improved.    Nutrition Goals Re-Evaluation:  Nutrition Goals Re-Evaluation    [place] 11/10/20 0832 12/05/20 1353 01/02/21 0844         Goals   Current Weight 198 lb  10.2 oz (90.1 kg) 197 lb 8.5 oz (89.6 kg) 192 lb 0.3 oz (87.1 kg)     Nutrition Goal - Pt to have consistent intake of vitamin k foods Pt to have consistent intake of vitamin k foods           Personal Goal #2 Re-Evaluation   Personal Goal #2 - Pt to continue weighing daily Pt to continue weighing daily           Personal Goal #3 Re-Evaluation   Personal Goal #3 - Pt to limit sodium intake by reading labels and limiting to <300 mg/label Pt to limit sodium intake by reading labels and limiting to <300 mg/label            Nutrition Goals Discharge (Final Nutrition Goals Re-Evaluation):  Nutrition Goals Re-Evaluation - 01/02/21 0844      Goals   Current Weight 192 lb 0.3 oz (87.1 kg)    Nutrition Goal Pt to have consistent intake of vitamin k foods      Personal Goal #2 Re-Evaluation   Personal Goal #2 Pt to continue weighing daily      Personal Goal #3 Re-Evaluation   Personal Goal #3 Pt to limit sodium intake by reading labels and limiting to <300 mg/label           Psychosocial: Target Goals: Acknowledge presence or absence of significant depression and/or stress, maximize coping skills, provide positive support system. Participant is able to verbalize types and ability to use techniques and skills needed for reducing stress and depression.  Initial Review & Psychosocial Screening:  Initial Psych Review & Screening - 11/04/20 1013      Initial Review   Current issues with None Identified      Family Dynamics   Good Support System? Yes   Drug Rehab Facility     Barriers   Psychosocial barriers to participate in program The patient should benefit from training in stress  management and relaxation.      Screening Interventions   Interventions Encouraged to exercise           Quality of Life Scores:  Scores of 19 and below usually indicate a poorer quality of life in these areas.  A difference of  2-3 points is a clinically meaningful difference.  A difference of 2-3 points in the total score of the Quality of Life Index has been associated with significant improvement in overall quality of life, self-image, physical symptoms, and general health in studies assessing change in quality of life.  PHQ-9: Recent Review Flowsheet Data    Depression screen [facility] 2/9 11/04/2020 11/04/2020 08/03/2020 03/31/2020 02/03/2020   Decreased Interest 0 0 _0 Down, Depressed, Hopeless 0 0 1 0 0   PHQ - 2 Score 0 0 _1 Altered sleeping 1 - 3 0 1   Tired, decreased energy 3 - _2 Change in appetite 0 - 0 0 0   Feeling bad or failure about yourself  0 - 0 0 0   Trouble concentrating 0 - 0 0 0   Moving slowly or fidgety/restless 0 - 0 0 0   Suicidal thoughts 0 - 0 0 0   PHQ-9 Score 4 - _3 Difficult doing work/chores Somewhat difficult - Not difficult at all Somewhat difficult Somewhat difficult     Interpretation of Total Score  Total Score Depression Severity:  1-4 = Minimal depression, 5-9 = Mild  depression, 10-14 = Moderate depression, 15-19 = Moderately severe depression, 20-27 = Severe depression   Psychosocial Evaluation and Intervention:   Psychosocial Re-Evaluation:  Psychosocial Re-Evaluation    [place] Name 11/14/20 1307 12/01/20 0930 01/02/21 1335         Psychosocial Re-Evaluation   Current issues with None Identified None Identified None Identified     Comments No psychosocial concerns identified at this time. No barriers or psychosocial concerns identified at this time. No psychosocial concerns identified at this time.     Expected Outcomes For Burris to be free of psychosocial concerns while participating in pulmonary rehab. For Cleavon to  continue to be free of psychosocial concerns while participating in pulmonary rehab. For Heron to continue mental well-being while participating in pulmonary rehab.     Interventions Encouraged to attend Pulmonary Rehabilitation for the exercise - Stress management education;Encouraged to attend Pulmonary Rehabilitation for the exercise     Continue Psychosocial Services  No Follow up required - No Follow up required            Psychosocial Discharge (Final Psychosocial Re-Evaluation):  Psychosocial Re-Evaluation - 01/02/21 1335      Psychosocial Re-Evaluation   Current issues with None Identified    Comments No psychosocial concerns identified at this time.    Expected Outcomes For Hewitt to continue mental well-being while participating in pulmonary rehab.    Interventions Stress management education;Encouraged to attend Pulmonary Rehabilitation for the exercise    Continue Psychosocial Services  No Follow up required           Education: Education Goals: Education classes will be provided on a weekly basis, covering required topics. Participant will state understanding/return demonstration of topics presented.  Learning Barriers/Preferences:  Learning Barriers/Preferences - 11/04/20 1014      Learning Barriers/Preferences   Learning Barriers None    Learning Preferences Verbal Instruction;Group Instruction;Written Material           Education Topics: Risk Factor Reduction:  -Group instruction that is supported by a PowerPoint presentation. Instructor discusses the definition of a risk factor, different risk factors for pulmonary disease, and how the heart and lungs work together.   Flowsheet Row PULMONARY REHAB OTHER RESPIRATORY from 12/29/2020 in [place]  Date 12/29/20  Educator Handout      Nutrition for Pulmonary Patient:  -Group instruction provided by PowerPoint slides, verbal discussion, and written materials to support subject  matter. The instructor gives an explanation and review of healthy diet recommendations, which includes a discussion on weight management, recommendations for fruit and vegetable consumption, as well as protein, fluid, caffeine, fiber, sodium, sugar, and alcohol. Tips for eating when patients are short of breath are discussed.   Pursed Lip Breathing:  -Group instruction that is supported by demonstration and informational handouts. Instructor discusses the benefits of pursed lip and diaphragmatic breathing and detailed demonstration on how to preform both.     Oxygen Safety:  -Group instruction provided by PowerPoint, verbal discussion, and written material to support subject matter. There is an overview of "What is Oxygen" and "Why do we need it".  Instructor also reviews how to create a safe environment for oxygen use, the importance of using oxygen as prescribed, and the risks of noncompliance. There is a brief discussion on traveling with oxygen and resources the patient may utilize.   Oxygen Equipment:  -Group instruction provided by [facility] Staff utilizing handouts, written materials, and equipment demonstrations.   Signs and Symptoms:  -  Group instruction provided by written material and verbal discussion to support subject matter. Warning signs and symptoms of infection, stroke, and heart attack are reviewed and when to call the physician/911 reinforced. Tips for preventing the spread of infection discussed.   Advanced Directives:  -Group instruction provided by verbal instruction and written material to support subject matter. Instructor reviews Advanced Directive laws and proper instruction for filling out document.   Pulmonary Video:  -Group video education that reviews the importance of medication and oxygen compliance, exercise, good nutrition, pulmonary hygiene, and pursed lip and diaphragmatic breathing for the pulmonary patient.   Exercise for the Pulmonary Patient:   -Group instruction that is supported by a PowerPoint presentation. Instructor discusses benefits of exercise, core components of exercise, frequency, duration, and intensity of an exercise routine, importance of utilizing pulse oximetry during exercise, safety while exercising, and options of places to exercise outside of rehab.     Pulmonary Medications:  -Verbally interactive group education provided by instructor with focus on inhaled medications and proper administration. Flowsheet Row PULMONARY REHAB OTHER RESPIRATORY from 12/29/2020 in [place]  Date 12/01/20  Educator Handout      Anatomy and Physiology of the Respiratory System and Intimacy:  -Group instruction provided by PowerPoint, verbal discussion, and written material to support subject matter. Instructor reviews respiratory cycle and anatomical components of the respiratory system and their functions. Instructor also reviews differences in obstructive and restrictive respiratory diseases with examples of each. Intimacy, Sex, and Sexuality differences are reviewed with a discussion on how relationships can change when diagnosed with pulmonary disease. Common sexual concerns are reviewed. Flowsheet Row PULMONARY REHAB OTHER RESPIRATORY from 12/29/2020 in [place]  Date 12/22/20  Educator Handout  Instruction Review Code 1- Verbalizes Understanding      MD DAY -A group question and answer session with a medical doctor that allows participants to ask questions that relate to their pulmonary disease state.   OTHER EDUCATION -Group or individual verbal, written, or video instructions that support the educational goals of the pulmonary rehab program. [place] from 12/29/2020 in [place]  Date 11/10/20  Educator --  Willodean Rosenthal reading handout]      Holiday Eating Survival Tips:  -Group  instruction provided by PowerPoint slides, verbal discussion, and written materials to support subject matter. The instructor gives patients tips, tricks, and techniques to help them not only survive but enjoy the holidays despite the onslaught of food that accompanies the holidays. Flowsheet Row PULMONARY REHAB OTHER RESPIRATORY from 12/29/2020 in [place]  Date 12/08/20  Educator 12/08/20  [Handout]      Knowledge Questionnaire Score:  Knowledge Questionnaire Score - 11/04/20 1157      Knowledge Questionnaire Score   Pre Score 6/18           Core Components/Risk Factors/Patient Goals at Admission:  Personal Goals and Risk Factors at Admission - 11/04/20 1015      Core Components/Risk Factors/Patient Goals on Admission    Weight Management Weight Loss    Improve shortness of breath with ADL's Yes    Intervention Provide education, individualized exercise plan and daily activity instruction to help decrease symptoms of SOB with activities of daily living.    Expected Outcomes Short Term: Improve cardiorespiratory fitness to achieve a reduction of symptoms when performing ADLs;Long Term: Be able to perform more ADLs without symptoms or delay the  onset of symptoms           Core Components/Risk Factors/Patient Goals Review:   Goals and Risk Factor Review    Row Name 11/14/20 1309 12/01/20 0934 01/02/21 1336         Core Components/Risk Factors/Patient Goals Review   Personal Goals Review Improve shortness of breath with ADL's;Increase knowledge of respiratory medications and ability to use respiratory devices properly.;Develop more efficient breathing techniques such as purse lipped breathing and diaphragmatic breathing and practicing self-pacing with activity. Improve shortness of breath with ADL's;Increase knowledge of respiratory medications and ability to use respiratory devices properly.;Develop more efficient breathing techniques such as purse  lipped breathing and diaphragmatic breathing and practicing self-pacing with activity. Develop more efficient breathing techniques such as purse lipped breathing and diaphragmatic breathing and practicing self-pacing with activity.;Increase knowledge of respiratory medications and ability to use respiratory devices properly.;Improve shortness of breath with ADL's;Heart Failure     Review Cosmo just started the program, has attended 2 exercise sessions, too early to have made progress towards program goals. Usbaldo has attended 5 exercise sessions and has increased to level 3 on the nustep and level 1 on the bike.  He is very fragile and weak and slow to progress due to his pulmonary hypertension and heart failure. Jewett has been diligent in attending the exercise sessions, he is limited to increase his workloads d/t his heart failure and pulmonary hypertension.  He can only tolerate exercising on the nustep.     Expected Outcomes Asante will feel stronger in strength and stamina in the next full 30 days due to consistently exercising in pulmonary reha. For Sephiroth to at least maintain his level of fitness and hopefully continue to progress. Theon graduates this week, I hope he will continue exercising on his own.            Core Components/Risk Factors/Patient Goals at Discharge (Final Review):   Goals and Risk Factor Review - 01/02/21 1336      Core Components/Risk Factors/Patient Goals Review   Personal Goals Review Develop more efficient breathing techniques such as purse lipped breathing and diaphragmatic breathing and practicing self-pacing with activity.;Increase knowledge of respiratory medications and ability to use respiratory devices properly.;Improve shortness of breath with ADL's;Heart Failure    Review Lakin has been diligent in attending the exercise sessions, he is limited to increase his workloads d/t his heart failure and pulmonary hypertension.  He can only tolerate exercising on the  nustep.    Expected Outcomes Sonny graduates this week, I hope he will continue exercising on his own.           ITP Comments:   Comments: ITP REVIEW Pt is making expected progress toward pulmonary rehab goals after completing 13 sessions. Recommend continued exercise, life style modification, education, and utilization of breathing techniques to increase stamina and strength and decrease shortness of breath with exertion.

## 2021-01-05 ENCOUNTER — Other Ambulatory Visit: Payer: Self-pay

## 2021-01-05 ENCOUNTER — Encounter (HOSPITAL_COMMUNITY)
Admission: RE | Admit: 2021-01-05 | Discharge: 2021-01-05 | Disposition: A | Discharge status: Home / Self Care | Payer: Medicare Other | Source: Ambulatory Visit | Attending: Cardiology | Admitting: Cardiology

## 2021-01-05 ENCOUNTER — Encounter (HOSPITAL_COMMUNITY): Payer: Medicare Other

## 2021-01-05 DIAGNOSIS — I272 Pulmonary hypertension, unspecified: Secondary | ICD-10-CM | POA: Diagnosis not present

## 2021-01-05 NOTE — Progress Notes (Signed)
Daily Session Note  Patient Details  Name: Jesus Russell MRN: [id number] Date of Birth: May 17, 1951 Referring Provider:   April Manson Pulmonary Rehab Walk Test from 11/04/2020 in [place]  Referring Provider Dr. Aundra Dubin      Encounter Date: 01/05/2021  Check In:  Session Check In - 01/05/21 1323      Check-In   Supervising physician immediately available to respond to emergencies [facility] immediately available    Physician(s) Dr. Starla Link    Location [facility]    Staff Present Rosebud Poles, RN, Isaac Laud, MS, ACSM-CEP, Exercise Physiologist    Virtual Visit No    Medication changes reported     No    Fall or balance concerns reported    No    Tobacco Cessation No Change    Warm-up and Cool-down Performed on first and last piece of equipment    Resistance Training Performed Yes    VAD Patient? No    PAD/SET Patient? No      Pain Assessment   Currently in Pain? No/denies    Multiple Pain Sites No           Capillary Blood Glucose: No results found for this or any previous visit (from the past 24 hour(s)).    Social History   Tobacco Use  Smoking Status Former Smoker  . Packs/day: 1.50  . Years: 23.00  . Pack years: 34.50  . Types: Cigarettes  . Quit date: 35  . Years since quitting: 27.0  Smokeless Tobacco Never Used    Goals Met:  Proper associated with RPD/PD & O2 Sat Exercise tolerated well No report of cardiac concerns or symptoms Strength training completed today  Goals Unmet:  Not Applicable  Comments: Service time is from 1300 to 1345 Patient completed and graduated from pulmonary rehab today and made improvements with 6 minute walk test.     Dr. Fransico Him is Medical Director for Cardiac Rehab at [facility].

## 2021-01-10 ENCOUNTER — Other Ambulatory Visit: Payer: Self-pay | Admitting: Internal Medicine

## 2021-01-10 ENCOUNTER — Encounter (HOSPITAL_COMMUNITY): Payer: Medicare Other

## 2021-01-11 ENCOUNTER — Ambulatory Visit (INDEPENDENT_AMBULATORY_CARE_PROVIDER_SITE_OTHER): Payer: Medicare Other | Admitting: Internal Medicine

## 2021-01-11 ENCOUNTER — Encounter: Payer: Self-pay | Admitting: Internal Medicine

## 2021-01-11 ENCOUNTER — Other Ambulatory Visit: Payer: Self-pay

## 2021-01-11 VITALS — BP 130/74 | HR 85 | Temp 97.6°F | Resp 20 | Ht 65.0 in | Wt 189.6 lb

## 2021-01-11 DIAGNOSIS — I2782 Chronic pulmonary embolism: Secondary | ICD-10-CM

## 2021-01-11 DIAGNOSIS — I2609 Other pulmonary embolism with acute cor pulmonale: Secondary | ICD-10-CM

## 2021-01-11 DIAGNOSIS — I272 Pulmonary hypertension, unspecified: Secondary | ICD-10-CM

## 2021-01-11 DIAGNOSIS — Z9989 Dependence on other enabling machines and devices: Secondary | ICD-10-CM

## 2021-01-11 DIAGNOSIS — D860 Sarcoidosis of lung: Secondary | ICD-10-CM | POA: Diagnosis not present

## 2021-01-11 DIAGNOSIS — G4733 Obstructive sleep apnea (adult) (pediatric): Secondary | ICD-10-CM | POA: Diagnosis not present

## 2021-01-11 DIAGNOSIS — J449 Chronic obstructive pulmonary disease, unspecified: Secondary | ICD-10-CM

## 2021-01-11 MED ORDER — ALBUTEROL SULFATE HFA 108 (90 BASE) MCG/ACT IN AERS: 2.0000 | INHALATION_SPRAY | Freq: Four times a day (QID) | RESPIRATORY_TRACT | 5 refills | Status: DC | PRN

## 2021-01-11 MED ORDER — TRELEGY ELLIPTA 200-62.5-25 MCG/INH IN AEPB: 1.0000 | INHALATION_SPRAY | Freq: Every day | RESPIRATORY_TRACT | 5 refills | Status: DC

## 2021-01-11 NOTE — Patient Instructions (Signed)
The patient should have follow up scheduled with myself in 4 months.   Continue trelegy.   Take the albuterol rescue inhaler every 4 to 6 hours as needed for wheezing or shortness of breath. You can also take it 15 minutes before exercise or exertional activity. Side effects include heart racing or pounding, jitters or anxiety. If you have a history of an irregular heart rhythm, it can make this worse. Can also give some patients a hard time sleeping.

## 2021-01-11 NOTE — Progress Notes (Signed)
Jesus Russell    [id number]    Sep 02, 1951  Primary Care Physician:Steen, Jeneen Rinks, MD Date of Appointment: 01/11/2021 Established Patient Visit  Chief complaint:   Chief Complaint  Patient presents with  . Follow-up     HPI: Jesus Russell is a 70 y.o. 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 He maintains on [facility]. Has seen Dr. Marigene Ehlers in cardiology and started on pulmonary vasodilators - Tyvaaso.  He takes it 2-3 times/day instead of 4 because he feels better when he takes it less. He is also on [place] TID. Has been wearing CPAP since Nov 2021.   CPAP download reviewed - excellent compliance. He feels much better when he wears CPAP. Getting more sleep and feeling less fatigued.   He just finished a course of cardiopulmonary rehabilitation and is continuing the stretching at home.   He has mmrc 4 dyspnea.   He is taking trelegy and albuterol. Takes it less than once/week.   I have reviewed the patient's family social and past medical history and updated as appropriate.   Past Medical History:  Diagnosis Date  . Acute on chronic respiratory failure with hypoxia ([place])   . COVID-19 virus infection   . DVT of lower extremity, bilateral ([place])   . Epistaxis   . Hypertension   . Incarcerated umbilical hernia [id number]/2174   Status post repair  . Lupus anticoagulant positive    Per records from [facility]  . 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 Pulmonary artery hypertension ([place]) 04/2011   Mildly elevated pulmonary artery pressure in setting of PE (from [facility])  . Pulmonary embolism associated with COVID-19 ([place])   . Subdural hematoma ([facility])    Remote episode, occurred when taking excedrin and warfarin.     Past  Surgical History:  Procedure Laterality Date  . BRAIN SURGERY    . HERNIA REPAIR    . IR KYPHO EA ADDL LEVEL THORACIC OR LUMBAR  05/04/2020  . IR KYPHO EA ADDL LEVEL THORACIC OR LUMBAR  05/04/2020  . IR KYPHO THORACIC WITH BONE BIOPSY  05/04/2020  . IR RADIOLOGIST EVAL & MGMT  04/07/2020  . IR RADIOLOGIST EVAL & MGMT  06/08/2020  . NASAL ENDOSCOPY WITH EPISTAXIS CONTROL N/A 02/24/2020   Procedure: NASAL ENDOSCOPY WITH EPISTAXIS CONTROL;  Surgeon: [name], MD;  Location: [facility] OR;  Service: ENT;  Laterality: N/A;  . RIGHT HEART CATH N/A 07/06/2020   Procedure: RIGHT HEART CATH;  Surgeon: [name], MD;  Location: [place] [facility] LAB;  Service: Cardiovascular;  Laterality: N/A;    Family History  Adopted: Yes    Social History   Occupational History  . Occupation: retired Pharmacist, hospital  Tobacco Use  . Smoking status: Former Smoker    Packs/day: 1.50    Years: 23.00    Pack years: 34.50    Types: Cigarettes    Quit date: 1995    Years since quitting: 27.0  . Smokeless tobacco: Never Used  Vaping Use  . Vaping Use: Never used  Substance and Sexual Activity  . Alcohol use: Never  . Drug use: Not Currently    Types: "Crack" cocaine    Comment: quit 2008  .  Sexual activity: Not on file     Physical Exam: Blood pressure 130/74, pulse 85, temperature 97.6 F (36.4 C), temperature source Skin, resp. rate 20, height _0  (1.651 m), weight 189 lb 9.6 oz (86 kg), SpO2 93 %.  Gen:      No acute distress Lungs:    No increased respiratory effort, symmetric chest wall excursion,diminished bilaterally,no wheezes or crackles CV:         Regular rate and rhythm; no murmurs, rubs, or gallops.  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 After  ministration of albuterol, the FVC is 1.97 L, 43% predicted *Reduced FEV1/FVC of 66%. *Reduced FEV1 1.43 L, 45% predicted. Impression:There is moderate combined obstructive/restrictive ventilatory defect. There is no clear improvement with administration of a bronchodilator. There is relative residual hyperinflation. The reduced ERV is likely due to body habitus. The diffusing capacity is mildly reduced, but normalizes after correcting for alveolar volume.  PFTs (04/02/2018): Findings (summarized): *FVC is moderately reduced, 2.01 L, 49% predicted *FEV1 moderately reduced, 1.41 L, 49% predicted *FEV1/FVC is normal, 70% *After bronchodilator the FVC is 58% predicted,19% increase, FEV1 is 51% predicted, FEV1/FVC mildly reduced, 61%.  Labs:  Immunization status: Immunization History  Administered Date(s) Administered  . Fluad Quad(high Dose 65+) 09/21/2020  . Influenza,inj,Quad PF,6+ Mos 09/09/2019  . 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 Continue coumadin life long For COPD which is controlled for now can continue trelegy and albuterol prn. Start taking albuterol prior to exercise to see if it helps with exercise tolerance. Keep doing rehab exercises and walking at home when possible.  CPAP download reviewed he has excellent adherence with suppression of AHI. He is having still sone AHI elevation on average 9.2 events/hour which may be related to some leak. He may need to go for mask fit testing if this doesn't improve.    Return to Care: Return in about 4  months (around 05/11/2021).   Lenice Llamas, MD Pulmonary and [place]

## 2021-01-12 ENCOUNTER — Encounter (HOSPITAL_COMMUNITY): Payer: Medicare Other

## 2021-01-17 ENCOUNTER — Encounter (HOSPITAL_COMMUNITY): Payer: Medicare Other

## 2021-01-19 ENCOUNTER — Encounter (HOSPITAL_COMMUNITY): Payer: Medicare Other

## 2021-01-23 ENCOUNTER — Ambulatory Visit (INDEPENDENT_AMBULATORY_CARE_PROVIDER_SITE_OTHER): Payer: Medicare Other | Admitting: Pharmacist

## 2021-01-23 ENCOUNTER — Telehealth (HOSPITAL_COMMUNITY): Payer: Self-pay

## 2021-01-23 ENCOUNTER — Other Ambulatory Visit: Payer: Self-pay

## 2021-01-23 ENCOUNTER — Encounter (HOSPITAL_COMMUNITY): Payer: Self-pay | Admitting: Cardiology

## 2021-01-23 ENCOUNTER — Ambulatory Visit (HOSPITAL_COMMUNITY)
Admission: RE | Admit: 2021-01-23 | Discharge: 2021-01-23 | Disposition: A | Discharge status: Home / Self Care | Payer: Medicare Other | Source: Ambulatory Visit | Attending: Cardiology | Admitting: Cardiology

## 2021-01-23 VITALS — BP 120/70 | HR 72 | Wt 188.4 lb

## 2021-01-23 DIAGNOSIS — G4733 Obstructive sleep apnea (adult) (pediatric): Secondary | ICD-10-CM | POA: Diagnosis not present

## 2021-01-23 DIAGNOSIS — Z7901 Long term (current) use of anticoagulants: Secondary | ICD-10-CM | POA: Insufficient documentation

## 2021-01-23 DIAGNOSIS — I50812 Chronic right heart failure: Secondary | ICD-10-CM | POA: Diagnosis present

## 2021-01-23 DIAGNOSIS — D869 Sarcoidosis, unspecified: Secondary | ICD-10-CM | POA: Diagnosis not present

## 2021-01-23 DIAGNOSIS — I110 Hypertensive heart disease with heart failure: Secondary | ICD-10-CM | POA: Diagnosis not present

## 2021-01-23 DIAGNOSIS — Z87891 Personal history of nicotine dependence: Secondary | ICD-10-CM | POA: Insufficient documentation

## 2021-01-23 DIAGNOSIS — I272 Pulmonary hypertension, unspecified: Secondary | ICD-10-CM | POA: Insufficient documentation

## 2021-01-23 DIAGNOSIS — Z86718 Personal history of other venous thrombosis and embolism: Secondary | ICD-10-CM | POA: Diagnosis not present

## 2021-01-23 DIAGNOSIS — D6862 Lupus anticoagulant syndrome: Secondary | ICD-10-CM | POA: Diagnosis not present

## 2021-01-23 DIAGNOSIS — I4510 Unspecified right bundle-branch block: Secondary | ICD-10-CM | POA: Diagnosis not present

## 2021-01-23 DIAGNOSIS — Z8616 Personal history of COVID-19: Secondary | ICD-10-CM | POA: Diagnosis not present

## 2021-01-23 DIAGNOSIS — Z79899 Other long term (current) drug therapy: Secondary | ICD-10-CM | POA: Insufficient documentation

## 2021-01-23 DIAGNOSIS — T45515A Adverse effect of anticoagulants, initial encounter: Secondary | ICD-10-CM

## 2021-01-23 DIAGNOSIS — I2699 Other pulmonary embolism without acute cor pulmonale: Secondary | ICD-10-CM

## 2021-01-23 DIAGNOSIS — Z86711 Personal history of pulmonary embolism: Secondary | ICD-10-CM

## 2021-01-23 LAB — BRAIN NATRIURETIC PEPTIDE: B Natriuretic Peptide: 36.5 pg/mL (ref 0.0–100.0)

## 2021-01-23 LAB — BASIC METABOLIC PANEL
Anion gap: 8 (ref 5–15)
BUN: 15 mg/dL (ref 8–23)
CO2: 37 mmol/L — ABNORMAL HIGH (ref 22–32)
Calcium: 8.8 mg/dL — ABNORMAL LOW (ref 8.9–10.3)
Chloride: 94 mmol/L — ABNORMAL LOW (ref 98–111)
Creatinine, Ser: 1.13 mg/dL (ref 0.61–1.24)
GFR, Estimated: >60 mL/min (ref >60)
Glucose, Bld: 83 mg/dL (ref 70–99)
Potassium: 3.4 mmol/L — ABNORMAL LOW (ref 3.5–5.1)
Sodium: 139 mmol/L (ref 135–145)

## 2021-01-23 LAB — POCT INR: INR: 2.1 (ref 2.0–3.0)

## 2021-01-23 MED ORDER — POTASSIUM CHLORIDE CRYS ER 20 MEQ PO TBCR
60.0000 meq | EXTENDED_RELEASE_TABLET | Freq: Every day | ORAL | 3 refills | Status: DC
Start: 2021-01-23 — End: 2021-03-14

## 2021-01-23 NOTE — Progress Notes (Signed)
PCP: Madalyn Rob, MD HF Cardiology: Dr. Aundra Dubin  70 y.o. with history of sarcoidosis, prior PE, RV failure was referred by Dr. Shearon Stalls for evaluation of CHF/pulmonary hypertension.  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  Patient was on prednisone for a period of time, this was stopped earlier this year.  He is on 2 L home oxygen.  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 He has had recurrent PEs/DVTs.  He has an IVC filter.  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  Most recent PE was in 2/21 when he had been off warfarin transiently.  Last echo in 5/21 showed EF 55-60% with D-shaped septum and severely dilated/severely dysfunctional RV.  No estimation of PA pressure was available.    RHC in 7/21 showed normal PCWP and moderate PAH with PVR 5 WU. V/Q showed perfusion defects but thought to be due to sarcoidosis.  CTA chest in 7/21 showed no acute or chronic PE, signs of chronic sarcoidosis were present.   Patient presents for followup of pulmonary hypertension/RV failure.  He is now using CPAP.  Weight is down 11 lbs.  He continues to use home oxygen.  He has completed pulmonary rehab.  He has started on Tyvaso and titrated it up, felt like he felt worse on Tyvaso and has decreased it back down.  Now feeling better.  6 minute walk much improved today.  Breathing "pretty good."  Short of breath walking about 100-200 feet.  No chest pain.  No lightheadedness or palpitations.    Labs (6/21): pro-BNP 43, K 4.6, creatinine 0.89 Labs (8/21): hgb 10.5, K 3.8, creatinine 1.28 Labs (10/21): K 5.4, creatinine 1.1 Labs (11/21): K 3.3, creatinine 1.14  6 minute walk (11/21): 76 m 6 minute walk (1/22): 67 m  PMH: 1. Sarcoidosis: "Burned out."  Diagnosed in 1990 with bronchoscopy/biopsy.  Treated with prednisone, now off.  - On 2 L home oxygen chronically.  2. 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 H/o DVT and PE.   Has lupus anticoagulant.   - Most recent PE was in 2/21, he had been off warfarin transiently.  - V/Q scan in 7/21 with perfusion defects, CTA in 7/21 showed no acute or chronic thrombi, signs of sarcoidosis were present.  4. COVID-19 infection 1/21.  5. H/o compression fractures in 2/21.  - Had kyphoplasty 6. RV failure/pulmonary hypertension:  - Echo (5/21) with EF 55-60%, D-shaped interventricular septum, severely dilated RV with severe systolic dysfunction, dilated IVC.  - RHC (7/21) with mean RA 9, PA 62/21 mean 39, mean PCWP 11, CI 2.64, PVR 5 WU.  7. HTN 8. History of subdural hematoma remotely.   Social History   Socioeconomic History  . Marital status: Divorced    Spouse name: Not on file  . Number of children: Not on file  . Years of education: Not on file  . Highest education level: Not on file  Occupational History  . Occupation: retired Pharmacist, hospital  Tobacco Use  . Smoking status: Former Smoker    Packs/day: 1.50    Years: 23.00    Pack years: 34.50    Types: Cigarettes    Quit date: 1995    Years since quitting: 27.1  . Smokeless tobacco: Never Used  Vaping Use  . Vaping Use: Never used  Substance and Sexual Activity  . Alcohol use: Never  . Drug use: Not Currently    Types: "Crack" cocaine    Comment: quit 2008  .  Sexual activity: Not on file  Other Topics Concern  . Not on file  Social History Narrative  . Not on file   Social Determinants of Health   Financial Resource Strain: Not on file  Food Insecurity: Not on file  Transportation Needs: Not on file  Physical Activity: Not on file  Stress: Not on file  Social Connections: Not on file  Intimate Partner Violence: Not on file   Family History  Adopted: Yes   ROS: All systems reviewed and negative except as per HPI.   Current Outpatient Medications  Medication Sig Dispense Refill  . albuterol (VENTOLIN HFA) 108 (90 Base) MCG/ACT inhaler Inhale 2 puffs into the lungs every 6 (six) hours as  needed for wheezing or shortness of breath. 18 g 5  . alendronate (FOSAMAX) 70 MG tablet Take 1 tablet (70 mg total) by mouth every 7 (seven) days. Take with a full glass of water on an empty stomach. 12 tablet 3  . Ascorbic Acid (VITAMIN C) 500 MG CAPS Take 500 mg by mouth daily.    . cholecalciferol (VITAMIN D3) 25 MCG (1000 UT) tablet Take 1,000 Units by mouth daily.    . Fluticasone-Umeclidin-Vilant (TRELEGY ELLIPTA) 200-62.5-25 MCG/INH AEPB Inhale 1 puff into the lungs daily. 60 each 5  . omega-3 acid ethyl esters (LOVAZA) 1 g capsule TAKE 1 CAPSULE(1 GRAM TOTAL) BY MOUTH TWICE DAILY 180 capsule 0  . potassium chloride SA (KLOR-CON) 20 MEQ tablet Take 40 mEq by mouth once.    . Riociguat (ADEMPAS) 2.5 MG TABS Take 2.5 mg by mouth in the morning, at noon, and at bedtime. 90 tablet 11  . torsemide (DEMADEX) 20 MG tablet Take 40 mg by mouth every other day.    . torsemide (DEMADEX) 20 MG tablet Take 30 mg by mouth every other day.    . Treprostinil (TYVASO) 0.6 MG/ML SOLN Inhale 18 mcg into the lungs as directed.    . warfarin (COUMADIN) 4 MG tablet TAKE 1  TABLET BY MOUTH ONCE DAILY, EXCEPT ON MONDAYS, TAKE ONLY 1/2 TABLET ON MONDAYS. 84 tablet 1  . zinc gluconate 50 MG tablet Take 50 mg by mouth daily.     No current facility-administered medications for this encounter.   BP 120/70   Pulse 72   Wt 85.5 kg (188 lb 6.4 oz)   SpO2 99% Comment: 2 liters n/c  BMI 31.35 kg/m  General: NAD Neck: JVP 8 cm, no thyromegaly or thyroid nodule.  Lungs: Dry crackles at bases.  CV: Nondisplaced PMI.  Heart regular S1/S2, no S3/S4, no murmur. Trace ankle edema.  No carotid bruit.  Normal pedal pulses.  Abdomen: Soft, nontender, no hepatosplenomegaly, no distention.  Skin: Intact without lesions or rashes.  Neurologic: Alert and oriented x 3.  Psych: Normal affect. Extremities: No clubbing or cyanosis.  HEENT: Normal.   Assessment/Plan: 1. RV failure, pulmonary hypertension: Echo in 5/21 showed  EF 55-60%, D-shaped septum with severe RV dilation/severe RV dysfunction.  [place] in 7/21 showed mildly elevated RA pressure, moderate PAH with PVR 5 WU.  V/Q scan showed perfusion defects, but CTA chest in 7/21 showed no acute or chronic PE, V/Q findings were likely due to chronic sarcoidosis changes in lungs.  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  6 minute walk today significant better.  Had to decrease Tyvaso, felt like it made him worse at high dose.  No more than mild volume overload on  exam.  - Continue torsemide 80 mg daily alternating with 60 mg daily, BMET/BNP today.    - Continue riociguat 2.5 mg tid.  - Continue current Tyvaso, seems to be doing ok on lower dose.  If he cannot tolerate Tyvaso, would have him stop it and start on Opsumit 10 mg daily.  - Continue home oxygen and CPAP.  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  Has positive lupus anticoagulant.  - Continue warfarin, should have Lovenox bridge if stops warfarin (had PE when off warfarin transiently in 2/21).  3. Sarcoidosis: History of "burned out" pulmonary sarcoidosis.  This may be the cause of his pulmonary hypertension.  No definitive evidence for cardiac sarcoidosis, but could arrange for cardiac MRI in the future to investigate. He is no longer on prednisone.  - Continue 2 L home oxygen.  4. HTN: Continue current regimen,  BP controlled.  5. OSA: Use CPAP.   Followup in 6 wks  Loralie Champagne 01/23/2021

## 2021-01-23 NOTE — Progress Notes (Signed)
INTERNAL MEDICINE TEACHING ATTENDING ADDENDUM ° °I agree with pharmacy recommendations as outlined in their note.  ° °-Duncan [facility] MD ° °

## 2021-01-23 NOTE — Patient Instructions (Signed)
Patient instructed to take medications as defined in the Anti-coagulation Track section of this encounter.  Patient instructed to take today's dose.  Patient instructed to take one of your 28m strength blue warfarin tablets, by mouth, once-daily at 6PM-EXCEPT on Mondays, Wednesdays and Fridays, take 1&1/2 tablets on these days.  Patient verbalized understanding of these instructions.

## 2021-01-23 NOTE — Progress Notes (Signed)
6 Min Walk Test Completed  Pt ambulated 182.88 meters O2 Sat ranged 91%-100 %  on 2L oxygen HR ranged 86-116

## 2021-01-23 NOTE — Progress Notes (Signed)
Anticoagulation Management Jesus Russell is a 70 y.o. male who reports to the clinic for monitoring of warfarin treatment.    Indication: DVT, history of (resolved); PE, history of (resolved); long term current use of anticoagulant warfarin for goal INR 2.0 - 3.0.   Duration: indefinite Supervising physician: Lalla Brothers  Anticoagulation Clinic Visit History: Patient does not report signs/symptoms of bleeding or thromboembolism  Other recent changes: No diet, medications, lifestyle changes reported by the patient at this visit.  Anticoagulation Episode Summary    Current INR goal:  2.0-3.0  TTR:  67.0 % (1.2 y)  Next INR check:  02/17/2021  INR from last check:  2.1 (01/23/2021)  Weekly max warfarin dose:    Target end date:  Indefinite  INR check location:    Preferred lab:    Send INR reminders to:     Indications   Long term (current) use of anticoagulants [Z79.01] History of DVT (deep vein thrombosis) [Z86.718] Pulmonary embolism on long-term anticoagulation therapy (HCC) [I26.99 T45.515A] History of pulmonary embolism [Z86.711]       Comments:          No Known Allergies  Current Outpatient Medications:  .  albuterol (VENTOLIN HFA) 108 (90 Base) MCG/ACT inhaler, Inhale 2 puffs into the lungs every 6 (six) hours as needed for wheezing or shortness of breath., Disp: 18 g, Rfl: 5 .  alendronate (FOSAMAX) 70 MG tablet, Take 1 tablet (70 mg total) by mouth every 7 (seven) days. Take with a full glass of water on an empty stomach., Disp: 12 tablet, Rfl: 3 .  Ascorbic Acid (VITAMIN C) 500 MG CAPS, Take 500 mg by mouth daily., Disp: , Rfl:  .  cholecalciferol (VITAMIN D3) 25 MCG (1000 UT) tablet, Take 1,000 Units by mouth daily., Disp: , Rfl:  .  Fluticasone-Umeclidin-Vilant (TRELEGY ELLIPTA) 200-62.5-25 MCG/INH AEPB, Inhale 1 puff into the lungs daily., Disp: 60 each, Rfl: 5 .  omega-3 acid ethyl esters (LOVAZA) 1 g capsule, TAKE 1 CAPSULE(1 GRAM TOTAL) BY MOUTH TWICE  DAILY, Disp: 180 capsule, Rfl: 0 .  potassium chloride SA (KLOR-CON) 20 MEQ tablet, Take 40 mEq by mouth once., Disp: , Rfl:  .  Riociguat (ADEMPAS) 2.5 MG TABS, Take 2.5 mg by mouth in the morning, at noon, and at bedtime., Disp: 90 tablet, Rfl: 11 .  torsemide (DEMADEX) 20 MG tablet, Take 40 mg by mouth every other day., Disp: , Rfl:  .  torsemide (DEMADEX) 20 MG tablet, Take 30 mg by mouth every other day., Disp: , Rfl:  .  Treprostinil (TYVASO) 0.6 MG/ML SOLN, Inhale 18 mcg into the lungs as directed., Disp: , Rfl:  .  warfarin (COUMADIN) 4 MG tablet, TAKE 1  TABLET BY MOUTH ONCE DAILY, EXCEPT ON MONDAYS, TAKE ONLY 1/2 TABLET ON MONDAYS., Disp: 84 tablet, Rfl: 1 .  zinc gluconate 50 MG tablet, Take 50 mg by mouth daily., Disp: , Rfl:  Past Medical History:  Diagnosis Date  . Acute on chronic respiratory failure with hypoxia ([place])   . COVID-19 virus infection   . DVT of lower extremity, bilateral ([place])   . Epistaxis   . Hypertension   . Incarcerated umbilical hernia [id number]/7062   Status post repair  . Lupus anticoagulant positive    Per records from [facility]  . 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 Pulmonary artery hypertension ([place]) 04/2011   Mildly elevated pulmonary  artery pressure in setting of PE (from [facility])  . Pulmonary embolism associated with COVID-19 ([place])   . Subdural hematoma (H[facility])    Remote episode, occurred when taking excedrin and warfarin.    Social History   Socioeconomic History  . Marital status: Divorced    Spouse name: Not on file  . Number of children: Not on file  . Years of education: Not on file  . Highest education level: Not on file  Occupational History  . Occupation: retired Pharmacist, hospital  Tobacco Use  . Smoking status: Former Smoker    Packs/day: 1.50    Years: 23.00    Pack years: 34.50    Types: Cigarettes    Quit date: 1995    Years since quitting: 27.1  . Smokeless  tobacco: Never Used  Vaping Use  . Vaping Use: Never used  Substance and Sexual Activity  . Alcohol use: Never  . Drug use: Not Currently    Types: "Crack" cocaine    Comment: quit 2008  . Sexual activity: Not on file  Other Topics Concern  . Not on file  Social History Narrative  . Not on file   Social Determinants of Health   Financial Resource Strain: Not on file  Food Insecurity: Not on file  Transportation Needs: Not on file  Physical Activity: Not on file  Stress: Not on file  Social Connections: Not on file   Family History  Adopted: Yes    ASSESSMENT Recent Results: The most recent result is correlated with 42.5 mg per week: Lab Results  Component Value Date   INR 2.1 01/23/2021   INR 2.7 12/12/2020   INR 2.1 11/14/2020    Anticoagulation Dosing: Description   Take one of your 59m strength blue warfarin tablets, by mouth, once-daily at 6PM-EXCEPT on Mondays, Wednesdays and Fridays, take 1&1/2 tablets on these days.      INR today: Therapeutic  PLAN Weekly dose was increased by 6% to 45 mg per week  Patient Instructions  Patient instructed to take medications as defined in the Anti-coagulation Track section of this encounter.  Patient instructed to take today's dose.  Patient instructed to take one of your 443mstrength blue warfarin tablets, by mouth, once-daily at 6PM-EXCEPT on Mondays, Wednesdays and Fridays, take 1&1/2 tablets on these days.  Patient verbalized understanding of these instructions.    Patient advised to contact clinic or seek medical attention if signs/symptoms of bleeding or thromboembolism occur.  Patient verbalized understanding by repeating back information and was advised to contact me if further medication-related questions arise. Patient was also provided an information handout.  Follow-up Return in 25 days (on 02/17/2021) for Follow up INR.  JaPennie BanterPharmD, CPP  15 minutes spent face-to-face with the patient during  the encounter. 50% of time spent on education, including signs/sx bleeding and clotting, as well as food and drug interactions with warfarin. 50% of time was spent on fingerprick POC INR sample collection,processing, results determination, and documentation in [email or web address]

## 2021-01-23 NOTE — Patient Instructions (Signed)
Labs done today. We will contact you only if your labs are abnormal.  No medication changes were made. Please continue all current medications as prescribed.  Your physician recommends that you schedule a follow-up appointment in: 6 weeks  If you have any questions or concerns before your next appointment please send [place] a message through [place] or call our office at [phone].    TO LEAVE A MESSAGE FOR THE NURSE SELECT OPTION 2, PLEASE LEAVE A MESSAGE INCLUDING: . YOUR NAME . DATE OF BIRTH . CALL BACK NUMBER . REASON FOR CALL**this is important as we prioritize the call backs  YOU WILL RECEIVE A CALL BACK THE SAME DAY AS LONG AS YOU CALL BEFORE 4:00 PM   Do the following things EVERYDAY: 1) Weigh yourself in the morning before breakfast. Write it down and keep it in a log. 2) Take your medicines as prescribed 3) Eat low salt foods--Limit salt (sodium) to 2000 mg per day.  4) Stay as active as you can everyday 5) Limit all fluids for the day to less than 2 liters   At the [facility], you and your health needs are our priority. As part of our continuing mission to provide you with exceptional heart care, we have created designated Provider Care Teams. These Care Teams include your primary Cardiologist (physician) and Advanced Practice Providers (APPs- Physician Assistants and Nurse Practitioners) who all work together to provide you with the care you need, when you need it.   You may see any of the following providers on your designated Care Team at your next follow up: Marland Kitchen Dr Glori Bickers . Dr Loralie Champagne . Darrick Grinder, NP . Lyda Jester, PA . Audry Riles, PharmD   Please be sure to bring in all your medications bottles to every appointment. '

## 2021-01-23 NOTE — Telephone Encounter (Signed)
Patient advised and verbalized understanding. Lab appt scheduled,lab order entered.  Orders Placed This Encounter  Procedures  . Basic Metabolic Panel (BMET)    Standing Status:   Future    Standing Expiration Date:   01/23/2022    Order Specific Question:   Release to patient    Answer:   Immediate     Meds ordered this encounter  Medications  . potassium chloride SA (KLOR-CON) 20 MEQ tablet    Sig: Take 3 tablets (60 mEq total) by mouth daily. Please cancel all previous orders for current medication. Change in dosage or pill size.    Dispense:  90 tablet    Refill:  3

## 2021-01-23 NOTE — Telephone Encounter (Signed)
-----  Message from [name], MD sent at 01/23/2021 12:51 PM EST ----- Increase total daily KCl by 20 mEq.  BMET 1 week.

## 2021-01-24 ENCOUNTER — Encounter (HOSPITAL_COMMUNITY): Payer: Medicare Other

## 2021-01-24 ENCOUNTER — Telehealth (HOSPITAL_COMMUNITY): Payer: Self-pay | Admitting: Pharmacist

## 2021-01-24 NOTE — Telephone Encounter (Signed)
Received message from [facility] Nurse regarding Tyvaso titration:  Marcos Eke Tyvaso was decreased to 6 breaths QID as he has continued to experience the SOB after his Tyvaso treatment. Please see note below:  Patient seen for F/U visit via phone for reports of increased SOB after Tyvaso treatment. Patient alert and engaged during education. Patient on Tyvaso- 9 breaths QID. Vitals today were BP 120/72, HR 96, SpO2 94% on 2L via [place]. Weight has been 186lbs. Patient reports that he has significantly increased SOB after completing his Tyvaso treatment for approximately an hour. After about an hour, he starts to feel better and have less SOB. Patient reports this has improved since decreasing to 9 breaths QID of Tyvaso, but is still occurring. Patient decreased to 6 breaths QID to see if this helps his SOB. Patient states he would like to come off the Tyvaso if this does not improve. Patient independent with set-up, management, programming, cleaning and charging of device. Explained to patient potential side effects and when to notify CNSS/MDO/Pharmacist.  Follow-up visit scheduled for one week.    Audry Riles, PharmD, BCPS, BCCP, CPP Heart Failure Clinic Pharmacist [phone]

## 2021-01-24 NOTE — Progress Notes (Signed)
Discharge Progress Report  Patient Details  Name: Jesus Russell MRN: [id number] Date of Birth: [birth year] Referring Provider:   April Manson [facility] Walk Test from 11/04/2020 in [place]  Referring Provider Dr. Aundra Dubin       Number of Visits: 14  Reason for Discharge:  Patient reached a stable level of exercise. Patient independent in their exercise. Patient has met program and personal goals.  Smoking History:  Social History   Tobacco Use  Smoking Status Former Smoker  . Packs/day: 1.50  . Years: 23.00  . Pack years: 34.50  . Types: Cigarettes  . Quit date: 57  . Years since quitting: 27.1  Smokeless Tobacco Never Used    Diagnosis:  Pulmonary HTN ([place])  ADL UCSD:  Pulmonary Assessment Scores    Row Name 11/04/20 1155 01/05/21 1430       ADL UCSD   ADL Phase Entry Exit    SOB Score total 79 92         CAT Score   CAT Score 24 23         mMRC Score   mMRC Score 4 4           Initial Exercise Prescription:  Initial Exercise Prescription - 11/04/20 1200      Date of Initial Exercise RX and Referring Provider   Date 11/04/20    Referring Provider Dr. Aundra Dubin    Expected Discharge Date 01/05/21      Oxygen   Oxygen Continuous    Liters 2      Recumbant Bike   Level 1.5    Minutes 15      NuStep   Level 2    Minutes 15      Prescription Details   Frequency (times per week) 2    Duration Progress to 30 minutes of continuous aerobic without signs/symptoms of physical distress      Intensity   THRR 40-80% of Max Heartrate 61-122    Ratings of Perceived Exertion 11-13    Perceived Dyspnea 0-4      Progression   Progression Continue to progress workloads to maintain intensity without signs/symptoms of physical distress.      Resistance Training   Training Prescription Yes    Weight Blue bands    Reps 10-15           Discharge Exercise Prescription (Final Exercise Prescription  Changes):  Exercise Prescription Changes - 01/03/21 1400      Response to Exercise   Blood Pressure (Admit) 110/60    Blood Pressure (Exercise) 118/70    Blood Pressure (Exit) 104/60    Heart Rate (Admit) 84 bpm    Heart Rate (Exercise) 103 bpm    Heart Rate (Exit) 91 bpm    Oxygen Saturation (Admit) 94 %    Oxygen Saturation (Exercise) 88 %    Oxygen Saturation (Exit) 97 %    Rating of Perceived Exertion (Exercise) 13    Perceived Dyspnea (Exercise) 1    Duration Continue with 30 min of aerobic exercise without signs/symptoms of physical distress.    Intensity THRR unchanged      Progression   Progression Continue to progress workloads to maintain intensity without signs/symptoms of physical distress.      Resistance Training   Training Prescription Yes    Weight blue bands    Reps 10-15    Time 10 Minutes      Oxygen   Oxygen Continuous  Liters 4      NuStep   Level 3   level 4 for 5 minutes   SPM 80    Minutes 30    METs 2           Functional Capacity:  6 Minute Walk    Row Name 11/04/20 1130 01/05/21 1423       6 Minute Walk   Phase Initial Discharge    Distance 740 feet 853 feet    Distance % Change -- 15.27 %    Distance Feet Change -- 113 ft    Walk Time 6 minutes 6 minutes    # of Rest Breaks 0 0    MPH 1.4 1.62    METS 1.86 2.21    RPE 13 13    Perceived Dyspnea  2 2    VO2 Peak 6.49 7.74    Symptoms Yes (comment) No    Comments shortness of breath --    Resting HR 85 bpm 91 bpm    Resting BP 112/64 104/56    Resting Oxygen Saturation  97 % 93 %    Exercise Oxygen Saturation  during 6 min walk 91 % 87 %    Max Ex. HR 118 bpm 122 bpm    Max Ex. BP 120/66 130/64    2 Minute Post BP 102/68 104/66         Interval HR   1 Minute HR 101 107    2 Minute HR 100 111    3 Minute HR 107 114    4 Minute HR 114 122    5 Minute HR 116 115    6 Minute HR 118 115    2 Minute Post HR 103 96    Interval Heart Rate? Yes Yes         Interval  Oxygen   Interval Oxygen? Yes Yes    Baseline Oxygen Saturation % 97 % 93 %    1 Minute Oxygen Saturation % 99 % 92 %    1 Minute Liters of Oxygen 2 L 3 L    2 Minute Oxygen Saturation % 97 % 89 %    2 Minute Liters of Oxygen 2 L 3 L    3 Minute Oxygen Saturation % 93 % 88 %    3 Minute Liters of Oxygen 2 L 3 L    4 Minute Oxygen Saturation % 91 % 87 %    4 Minute Liters of Oxygen 2 L 3 L    5 Minute Oxygen Saturation % 91 % 88 %    5 Minute Liters of Oxygen 2 L 4 L  SaO2 dropped to 87% increased to 4L    6 Minute Oxygen Saturation % 91 % 89 %    6 Minute Liters of Oxygen 2 L 4 L    2 Minute Post Oxygen Saturation % 96 % 98 %    2 Minute Post Liters of Oxygen 2 L 4 L           Psychological, QOL, Others - Outcomes: PHQ 2/9: Depression screen [facility] 2/9 01/05/2021 11/04/2020 11/04/2020 08/03/2020 03/31/2020  Decreased Interest 0 0 0 1 1  Down, Depressed, Hopeless 0 0 0 1 0  PHQ - 2 Score 0 0 0 2 1  Altered sleeping 0 1 - 3 0  Tired, decreased energy 3 3 - 2 3  Change in appetite 0 0 - 0 0  Feeling bad or failure about yourself  0 0 - 0 0  Trouble concentrating 0 0 - 0 0  Moving slowly or fidgety/restless 0 0 - 0 0  Suicidal thoughts 0 0 - 0 0  PHQ-9 Score 3 4 - 7 4  Difficult doing work/chores Somewhat difficult Somewhat difficult - Not difficult at all Somewhat difficult    Quality of Life:   Personal Goals: Goals established at orientation with interventions provided to work toward goal.  Personal Goals and Risk Factors at Admission - 11/04/20 1015      Core Components/Risk Factors/Patient Goals on Admission    Weight Management Weight Loss    Improve shortness of breath with ADL's Yes    Intervention Provide education, individualized exercise plan and daily activity instruction to help decrease symptoms of SOB with activities of daily living.    Expected Outcomes Short Term: Improve cardiorespiratory fitness to achieve a reduction of symptoms when performing ADLs;Long  Term: Be able to perform more ADLs without symptoms or delay the onset of symptoms            Personal Goals Discharge:  Goals and Risk Factor Review    Row Name 11/14/20 1309 12/01/20 0934 01/02/21 1336         Core Components/Risk Factors/Patient Goals Review   Personal Goals Review Improve shortness of breath with ADL's;Increase knowledge of respiratory medications and ability to use respiratory devices properly.;Develop more efficient breathing techniques such as purse lipped breathing and diaphragmatic breathing and practicing self-pacing with activity. Improve shortness of breath with ADL's;Increase knowledge of respiratory medications and ability to use respiratory devices properly.;Develop more efficient breathing techniques such as purse lipped breathing and diaphragmatic breathing and practicing self-pacing with activity. Develop more efficient breathing techniques such as purse lipped breathing and diaphragmatic breathing and practicing self-pacing with activity.;Increase knowledge of respiratory medications and ability to use respiratory devices properly.;Improve shortness of breath with ADL's;Heart Failure     Review Meng just started the program, has attended 2 exercise sessions, too early to have made progress towards program goals. Kipton has attended 5 exercise sessions and has increased to level 3 on the nustep and level 1 on the bike.  He is very fragile and weak and slow to progress due to his pulmonary hypertension and heart failure. Tinsley has been diligent in attending the exercise sessions, he is limited to increase his workloads d/t his heart failure and pulmonary hypertension.  He can only tolerate exercising on the nustep.     Expected Outcomes Allex will feel stronger in strength and stamina in the next full 30 days due to consistently exercising in pulmonary reha. For Quadir to at least maintain his level of fitness and hopefully continue to progress. Giovonnie graduates  this week, I hope he will continue exercising on his own.            Exercise Goals and Review:  Exercise Goals    Row Name 11/04/20 1054             Exercise Goals   Increase Physical Activity Yes       Intervention Provide advice, education, support and counseling about physical activity/exercise needs.;Develop an individualized exercise prescription for aerobic and resistive training based on initial evaluation findings, risk stratification, comorbidities and participant's personal goals.       Expected Outcomes Short Term: Attend rehab on a regular basis to increase amount of physical activity.;Long Term: Add in home exercise to make exercise part of routine and to increase amount of physical activity.;Long  Term: Exercising regularly at least 3-5 days a week.       Increase Strength and Stamina Yes       Intervention Provide advice, education, support and counseling about physical activity/exercise needs.;Develop an individualized exercise prescription for aerobic and resistive training based on initial evaluation findings, risk stratification, comorbidities and participant's personal goals.       Expected Outcomes Short Term: Increase workloads from initial exercise prescription for resistance, speed, and METs.;Short Term: Perform resistance training exercises routinely during rehab and add in resistance training at home;Long Term: Improve cardiorespiratory fitness, muscular endurance and strength as measured by increased METs and functional capacity (6MWT)       Able to understand and use rate of perceived exertion (RPE) scale Yes       Intervention Provide education and explanation on how to use RPE scale       Expected Outcomes Short Term: Able to use RPE daily in rehab to express subjective intensity level;Long Term:  Able to use RPE to guide intensity level when exercising independently       Able to understand and use Dyspnea scale Yes       Intervention Provide education and  explanation on how to use Dyspnea scale       Expected Outcomes Short Term: Able to use Dyspnea scale daily in rehab to express subjective sense of shortness of breath during exertion;Long Term: Able to use Dyspnea scale to guide intensity level when exercising independently       Knowledge and understanding of Target Heart Rate Range (THRR) Yes       Intervention Provide education and explanation of THRR including how the numbers were predicted and where they are located for reference       Expected Outcomes Short Term: Able to state/look up THRR;Long Term: Able to use THRR to govern intensity when exercising independently;Short Term: Able to use daily as guideline for intensity in rehab       Understanding of Exercise Prescription Yes       Intervention Provide education, explanation, and written materials on patient's individual exercise prescription       Expected Outcomes Short Term: Able to explain program exercise prescription;Long Term: Able to explain home exercise prescription to exercise independently              Exercise Goals Re-Evaluation:  Exercise Goals Re-Evaluation    Row Name 11/10/20 0733 12/06/20 0807 01/03/21 0755         Exercise Goal Re-Evaluation   Exercise Goals Review Increase Physical Activity;Increase Strength and Stamina;Able to understand and use rate of perceived exertion (RPE) scale;Able to understand and use Dyspnea scale;Knowledge and understanding of Target Heart Rate Range (THRR);Understanding of Exercise Prescription Increase Physical Activity;Increase Strength and Stamina;Able to understand and use rate of perceived exertion (RPE) scale;Able to understand and use Dyspnea scale;Knowledge and understanding of Target Heart Rate Range (THRR);Understanding of Exercise Prescription Increase Physical Activity;Increase Strength and Stamina;Able to understand and use rate of perceived exertion (RPE) scale;Able to understand and use Dyspnea scale;Knowledge and  understanding of Target Heart Rate Range (THRR);Understanding of Exercise Prescription     Comments Pt has completed 1 exercise session and tolerated well with no complaints or concerns. He is exercising at 1.7 METS on the Nustep. Will continue to monitor and progress as he is able. Sevag has completed 6 exercise sessions. He experiences mild SOB with exertion and has to take a couple of breaks in between exercise equipment. He also struggles  on the recumbent bike due to his stomach getting in the way of his range of motion. Otherwise he has tolerated well. He has been a little slow with progressions due to complaints listed above. He is exercising at 1.9 METS on the Nustep and 1.6 METS on the bike. Will continue to monitor and progress as he is able. Juanya has completed 13 exercise sessions and has made improvements with workload increases and METS. Lonzell is not able to do 30 minutes straight of exercise on the NuStep. He was having to take a couple of breaks during the 30 minutes due to shortness of breath. He states he can tell a slight decrease of shortness of breath with activities. He is exercising at 2.0 METS on the Nustep and is completely independent with resistance bands and cool down stretches. Will continue to monitor and progress as he is able.     Expected Outcomes Through exercise at rehab and home the patient will decrease shortness of breath with daily activities and feel confident in carrying out an exercise regimn at home. Through exercise at rehab and home the patient will decrease shortness of breath with daily activities and feel confident in carrying out an exercise regimn at home. Through exercise at rehab and home the patient will decrease shortness of breath with daily activities and feel confident in carrying out an exercise regimn at home.            Nutrition & Weight - Outcomes:  Pre Biometrics - 11/04/20 1130      Pre Biometrics   Grip Strength 17 kg           Post  Biometrics - 01/05/21 1428       Post  Biometrics   Grip Strength 17.5 kg           Nutrition:  Nutrition Therapy & Goals - 12/08/20 1313      Nutrition Therapy   Diet TLC;low sodium           Nutrition Discharge:  Nutrition Assessments - 01/02/21 0844      Rate Your Plate Scores   Pre Score 55           Education Questionnaire Score:  Knowledge Questionnaire Score - 01/05/21 1430      Knowledge Questionnaire Score   Post Score 18/18           Goals reviewed with patient; copy given to patient.

## 2021-01-26 ENCOUNTER — Encounter (HOSPITAL_COMMUNITY): Payer: Medicare Other

## 2021-01-30 ENCOUNTER — Other Ambulatory Visit: Payer: Self-pay

## 2021-01-30 ENCOUNTER — Ambulatory Visit (HOSPITAL_COMMUNITY)
Admission: RE | Admit: 2021-01-30 | Discharge: 2021-01-30 | Disposition: A | Discharge status: Home / Self Care | Payer: Medicare Other | Source: Ambulatory Visit | Attending: Cardiology | Admitting: Cardiology

## 2021-01-30 DIAGNOSIS — I50810 Right heart failure, unspecified: Secondary | ICD-10-CM | POA: Diagnosis not present

## 2021-01-30 LAB — BASIC METABOLIC PANEL
Anion gap: 10 (ref 5–15)
BUN: 16 mg/dL (ref 8–23)
CO2: 34 mmol/L — ABNORMAL HIGH (ref 22–32)
Calcium: 9.3 mg/dL (ref 8.9–10.3)
Chloride: 92 mmol/L — ABNORMAL LOW (ref 98–111)
Creatinine, Ser: 1.21 mg/dL (ref 0.61–1.24)
GFR, Estimated: >60 mL/min (ref >60)
Glucose, Bld: 82 mg/dL (ref 70–99)
Potassium: 4 mmol/L (ref 3.5–5.1)
Sodium: 136 mmol/L (ref 135–145)

## 2021-01-31 ENCOUNTER — Encounter (HOSPITAL_COMMUNITY): Payer: Medicare Other

## 2021-02-02 ENCOUNTER — Encounter (HOSPITAL_COMMUNITY): Payer: Medicare Other

## 2021-02-06 ENCOUNTER — Ambulatory Visit (INDEPENDENT_AMBULATORY_CARE_PROVIDER_SITE_OTHER): Payer: Medicare Other | Admitting: Internal Medicine

## 2021-02-06 VITALS — BP 94/59 | HR 74 | Temp 98.1°F | Ht 65.0 in | Wt 190.0 lb

## 2021-02-06 DIAGNOSIS — Z23 Encounter for immunization: Secondary | ICD-10-CM

## 2021-02-06 DIAGNOSIS — H259 Unspecified age-related cataract: Secondary | ICD-10-CM | POA: Diagnosis not present

## 2021-02-06 DIAGNOSIS — Z1211 Encounter for screening for malignant neoplasm of colon: Secondary | ICD-10-CM

## 2021-02-06 NOTE — Patient Instructions (Signed)
Thank you, [name] for allowing [place] to provide your care today. Today we discussed opthalmology referral , screening for colon cancer, and need for pneumonia vaccine .    I have ordered the following labs for you:   Lab Orders     Fecal occult blood, imunochemical   Tests ordered today:   Referrals ordered today:    Referral Orders     Ambulatory referral to Ophthalmology   I have ordered the following medication/changed the following medications:   Stop the following medications: There are no discontinued medications.   Start the following medications: No orders of the defined types were placed in this encounter.    Follow up: 3 months  Should you have any questions or concerns please call the internal medicine clinic at [phone].      Tamsen Snider, M.D. [place]

## 2021-02-06 NOTE — Progress Notes (Unsigned)
   CC: Screening for colon cancer, referral to ophthalmologist for cataracts, and pneumonia vaccine  HPI:[name] is a 70 y.o. male who presents for screening for colon cancer, referral to ophthalmologist, and pneumonia vaccine. Please see individual problem based A/P for details.   Past Medical History:  Diagnosis Date  . Acute on chronic respiratory failure with hypoxia ([place])   . COVID-19 virus infection   . DVT of lower extremity, bilateral ([place])   . Epistaxis   . Hypertension   . Incarcerated umbilical hernia 73/2202   Status post repair  . Lupus anticoagulant positive    Per records from [facility]  . 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 Pulmonary artery hypertension ([place]) 04/2011   Mildly elevated pulmonary artery pressure in setting of PE (from [facility])  . Pulmonary embolism associated with COVID-19 ([place])   . Subdural hematoma ([facility])    Remote episode, occurred when taking excedrin and warfarin.    Review of Systems:   Review of Systems  Constitutional: Negative for chills and fever.  Musculoskeletal: Negative for falls and joint pain.     Physical Exam: Vitals:   02/06/21 1512  BP: (!) 94/59  Pulse: 74  Temp: 98.1 F (36.7 C)  TempSrc: Oral  SpO2: 98%  Weight: 190 lb (86.2 kg)  Height: _0  (1.651 m)   Physical Exam Constitutional:      General: He is not in acute distress.    Comments: Well kept, wearing nasal canula   Cardiovascular:     Rate and Rhythm: Normal rate and regular rhythm.  Pulmonary:     Effort: Pulmonary effort is normal.     Breath sounds: No wheezing, rhonchi or rales.  Abdominal:     General: Bowel sounds are normal. There is distension.     Palpations: Abdomen is soft.     Tenderness: There is no guarding.  Musculoskeletal:     Right lower leg: No edema.     Left lower leg: No edema.  Skin:    General: Skin is warm and dry.  Psychiatric:         Mood and Affect: Mood normal.        Behavior: Behavior normal.       Assessment & Plan:   See Encounters Tab for problem based charting.  Patient discussed with Dr. Angelia Mould

## 2021-02-07 ENCOUNTER — Encounter: Payer: Self-pay | Admitting: Internal Medicine

## 2021-02-07 DIAGNOSIS — H269 Unspecified cataract: Secondary | ICD-10-CM | POA: Insufficient documentation

## 2021-02-07 DIAGNOSIS — Z1211 Encounter for screening for malignant neoplasm of colon: Secondary | ICD-10-CM | POA: Insufficient documentation

## 2021-02-07 DIAGNOSIS — Z23 Encounter for immunization: Secondary | ICD-10-CM | POA: Insufficient documentation

## 2021-02-07 NOTE — Assessment & Plan Note (Signed)
-  First of 2 part series for PCV-13 given today.

## 2021-02-07 NOTE — Assessment & Plan Note (Signed)
Recently seen by optometrist and needs a referral to opthalmology to evaluate cataracts.   - Ambulatory referral to Ophthalmology

## 2021-02-07 NOTE — Assessment & Plan Note (Signed)
Discussed screening for colon cancer with patient.  Given his other comorbidities I recommended FIT test. We also discussed it is likely he would pass away from his heart disease or lung disease before colon cancer would be an issue for him. Not likely he would need further screening unless he request it.

## 2021-02-08 ENCOUNTER — Other Ambulatory Visit: Payer: Medicare Other

## 2021-02-08 DIAGNOSIS — Z1211 Encounter for screening for malignant neoplasm of colon: Secondary | ICD-10-CM

## 2021-02-09 LAB — FECAL OCCULT BLOOD, IMMUNOCHEMICAL: Fecal Occult Bld: POSITIVE — AB

## 2021-02-09 NOTE — Progress Notes (Signed)
Internal Medicine Clinic Attending  Case discussed with Dr. Court Joy  At the time of the visit.  We reviewed the resident's history and exam and pertinent patient test results.  I agree with the assessment, diagnosis, and plan of care documented in the resident's note.

## 2021-02-10 ENCOUNTER — Telehealth: Payer: Self-pay | Admitting: Internal Medicine

## 2021-02-10 NOTE — Telephone Encounter (Signed)
Called and discussed positive fecal occult blood test. He is currently on warfarin and has no gross bleeding.  His bowel movements have been normal.  My recommendation is to keep an eye out for changes in bowel movements or any gross blood. Jesus Russell and I agree he has chronic medical conditions which would make a colonoscopy a high risk procedure for him. We will defer further work-up at this time due to his high risk.

## 2021-02-12 ENCOUNTER — Other Ambulatory Visit: Payer: Self-pay | Admitting: Internal Medicine

## 2021-02-14 ENCOUNTER — Telehealth (HOSPITAL_COMMUNITY): Payer: Self-pay | Admitting: *Deleted

## 2021-02-14 NOTE — Telephone Encounter (Signed)
Stop Tyvaso and start on Opsumit 10 mg daily.

## 2021-02-14 NOTE — Telephone Encounter (Signed)
[facility] specialty nurse contacted Lauren kemp about pts Tyvaso. Please see below   I wanted to let you know that I spoke with Jesus Russell (Feb 06, 1951) who continues to report SOB after decreasing to 3 breaths QID of his Tyvaso. Please see note below: Patient seen for F/U visit for reports of increased SOB after Tyvaso treatment. Patient alert and engaged during education. Patient on Tyvaso- 3 breaths QID. Vitals today were BP 99/63, HR 96, SpO2 96% on 2L via [place]. Weight has been 182lbs. Patient reports still having SOB after treatment for approx. an hour and feels his breathing is significantly worse. Patient reports no other side effects at this time. He is requesting to stop the Tyvaso as he feels this SOB is causing him issues. Patient independent with set-up, management, programming, cleaning and charging of device. Explained to patient potential side effects and when to notify CNSS/MDO/Pharmacist. Follow-up visit scheduled for one week. 1. I wanted to confirm you and Dr. Aundra Dubin are in agreement with having him discontinue the Tyvaso? He did mention that Dr. Aundra Dubin had talked with him about starting another medication instead of the Tyvaso  1. I wanted to confirm you and Dr. Aundra Dubin are in agreement with having him discontinue the Tyvaso? He did mention that Dr. Aundra Dubin had talked with him about starting another medication instead of the Tyvaso.    Routed to [place]

## 2021-02-15 ENCOUNTER — Telehealth (HOSPITAL_COMMUNITY): Payer: Self-pay | Admitting: Pharmacy Technician

## 2021-02-15 NOTE — Telephone Encounter (Signed)
Patient Advocate Encounter   Received notification from [facility] that prior authorization for Opsumit is required.   PA submitted on CoverMyMeds Key [id number] Status is pending   Will continue to follow.

## 2021-02-16 ENCOUNTER — Telehealth (HOSPITAL_COMMUNITY): Payer: Self-pay | Admitting: Pharmacy Technician

## 2021-02-16 NOTE — Telephone Encounter (Signed)
Patient advised and verbalized understanding. New Rx sent into patients pharmacy, med list updated to reflect changes.

## 2021-02-16 NOTE — Telephone Encounter (Signed)
Advanced Heart Failure Patient Advocate Encounter  Prior Authorization for Opsumit has been approved.    PA# [id number] Effective dates: 02/15/21 through 12/23/21  Charlann Boxer, CPhT

## 2021-02-16 NOTE — Telephone Encounter (Signed)
Spoke with patient regarding Opsumit referral. He is going to come by the office sometime tomorrow to sign the paperwork.

## 2021-02-17 ENCOUNTER — Ambulatory Visit (INDEPENDENT_AMBULATORY_CARE_PROVIDER_SITE_OTHER): Payer: Medicare Other | Admitting: Pharmacist

## 2021-02-17 ENCOUNTER — Encounter: Payer: Medicare Other | Admitting: Internal Medicine

## 2021-02-17 DIAGNOSIS — I2699 Other pulmonary embolism without acute cor pulmonale: Secondary | ICD-10-CM | POA: Diagnosis not present

## 2021-02-17 DIAGNOSIS — Z7901 Long term (current) use of anticoagulants: Secondary | ICD-10-CM | POA: Diagnosis present

## 2021-02-17 DIAGNOSIS — Z86711 Personal history of pulmonary embolism: Secondary | ICD-10-CM

## 2021-02-17 DIAGNOSIS — Z86718 Personal history of other venous thrombosis and embolism: Secondary | ICD-10-CM

## 2021-02-17 DIAGNOSIS — T45515A Adverse effect of anticoagulants, initial encounter: Secondary | ICD-10-CM | POA: Diagnosis not present

## 2021-02-17 LAB — POCT INR: INR: 1.5 — AB (ref 2.0–3.0)

## 2021-02-17 MED ORDER — OPSUMIT 10 MG PO TABS: 10.0000 mg | ORAL_TABLET | Freq: Every day | ORAL | Status: DC

## 2021-02-17 NOTE — Patient Instructions (Signed)
Patient instructed to take medications as defined in the Anti-coagulation Track section of this encounter.  Patient instructed to take today's dose.  Patient instructed to take  one-and-one-half (1 & 1/2) of your 51m blue colored warfarin tablets all days of the week--EXCEPT on [facility] take only one (1) tablet on Wednesdays. Patient verbalized understanding of these instructions.

## 2021-02-17 NOTE — Telephone Encounter (Signed)
Med List updated to reflect changes. Pharmacy team is working on authorization for [employer]

## 2021-02-17 NOTE — Progress Notes (Signed)
Anticoagulation Management Jesus Russell is a 70 y.o. male who reports to the clinic for monitoring of warfarin treatment.    Indication: DVT, history of; PE, history of; Long term current use of warfarin anticoagulant. Duration: indefinite Supervising physician: Aldine Contes  Anticoagulation Clinic Visit History: Patient does not report signs/symptoms of bleeding or thromboembolism  Other recent changes: No diet, medications, lifestyle changes endorsed.  Anticoagulation Episode Summary    Current INR goal:  2.0-3.0  TTR:  64.3 % (1.3 y)  Next INR check:  03/13/2021  INR from last check:  1.5 (02/17/2021)  Weekly max warfarin dose:    Target end date:  Indefinite  INR check location:    Preferred lab:    Send INR reminders to:     Indications   Long term (current) use of anticoagulants [Z79.01] History of DVT (deep vein thrombosis) [Z86.718] Pulmonary embolism on long-term anticoagulation therapy (HCC) [I26.99 T45.515A] History of pulmonary embolism [Z86.711]       Comments:          No Known Allergies  Current Outpatient Medications:  .  albuterol (VENTOLIN HFA) 108 (90 Base) MCG/ACT inhaler, Inhale 2 puffs into the lungs every 6 (six) hours as needed for wheezing or shortness of breath., Disp: 18 g, Rfl: 5 .  alendronate (FOSAMAX) 70 MG tablet, Take 1 tablet (70 mg total) by mouth once a week., Disp: 4 tablet, Rfl: 5 .  Ascorbic Acid (VITAMIN C) 500 MG CAPS, Take 500 mg by mouth daily., Disp: , Rfl:  .  cholecalciferol (VITAMIN D3) 25 MCG (1000 UT) tablet, Take 1,000 Units by mouth daily., Disp: , Rfl:  .  Fluticasone-Umeclidin-Vilant (TRELEGY ELLIPTA) 200-62.5-25 MCG/INH AEPB, Inhale 1 puff into the lungs daily., Disp: 60 each, Rfl: 5 .  omega-3 acid ethyl esters (LOVAZA) 1 g capsule, TAKE 1 CAPSULE(1 GRAM TOTAL) BY MOUTH TWICE DAILY, Disp: 180 capsule, Rfl: 0 .  potassium chloride SA (KLOR-CON) 20 MEQ tablet, Take 3 tablets (60 mEq total) by mouth daily. Please  cancel all previous orders for current medication. Change in dosage or pill size., Disp: 90 tablet, Rfl: 3 .  Riociguat (ADEMPAS) 2.5 MG TABS, Take 2.5 mg by mouth in the morning, at noon, and at bedtime., Disp: 90 tablet, Rfl: 11 .  torsemide (DEMADEX) 20 MG tablet, Take 40 mg by mouth every other day., Disp: , Rfl:  .  torsemide (DEMADEX) 20 MG tablet, Take 30 mg by mouth every other day., Disp: , Rfl:  .  Treprostinil (TYVASO) 0.6 MG/ML SOLN, Inhale 18 mcg into the lungs as directed., Disp: , Rfl:  .  warfarin (COUMADIN) 4 MG tablet, TAKE 1  TABLET BY MOUTH ONCE DAILY, EXCEPT ON MONDAYS, TAKE ONLY 1/2 TABLET ON MONDAYS., Disp: 84 tablet, Rfl: 1 .  zinc gluconate 50 MG tablet, Take 50 mg by mouth daily., Disp: , Rfl:  Past Medical History:  Diagnosis Date  . Acute on chronic respiratory failure with hypoxia ([place])   . COVID-19 virus infection   . DVT of lower extremity, bilateral ([place])   . Epistaxis   . Hypertension   . Incarcerated umbilical hernia 97/0263   Status post repair  . Lupus anticoagulant positive    Per records from [facility]  . 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 Pulmonary artery hypertension ([place]) 04/2011   Mildly elevated pulmonary artery pressure in setting of PE (from [facility])  .  Pulmonary embolism associated with COVID-19 ([place])   . Subdural hematoma (HCC)    Remote episode, occurred when taking excedrin and warfarin.    Social History   Socioeconomic History  . Marital status: Divorced    Spouse name: Not on file  . Number of children: Not on file  . Years of education: Not on file  . Highest education level: Not on file  Occupational History  . Occupation: retired Pharmacist, hospital  Tobacco Use  . Smoking status: Former Smoker    Packs/day: 1.50    Years: 23.00    Pack years: 34.50    Types: Cigarettes    Quit date: 1995    Years since quitting: 27.1  . Smokeless tobacco: Never Used   Vaping Use  . Vaping Use: Never used  Substance and Sexual Activity  . Alcohol use: Never  . Drug use: Not Currently    Types: "Crack" cocaine    Comment: quit 2008  . Sexual activity: Not on file  Other Topics Concern  . Not on file  Social History Narrative  . Not on file   Social Determinants of Health   Financial Resource Strain: Not on file  Food Insecurity: Not on file  Transportation Needs: Not on file  Physical Activity: Not on file  Stress: Not on file  Social Connections: Not on file   Family History  Adopted: Yes    ASSESSMENT Recent Results: The most recent result is correlated with 34 mg per week: Lab Results  Component Value Date   INR 1.5 (A) 02/17/2021   INR 2.1 01/23/2021   INR 2.7 12/12/2020    Anticoagulation Dosing: Description   Take one-and-one-half (1 & 1/2) of your 21m blue colored warfarin tablets all days of the week--EXCEPT on [facility] take only one (1) tablet on Wednesdays.     INR today: Subtherapeutic  PLAN Weekly dose was increased by 17% to 40 mg per week  Patient Instructions  Patient instructed to take medications as defined in the Anti-coagulation Track section of this encounter.  Patient instructed to take today's dose.  Patient instructed to take  one-and-one-half (1 & 1/2) of your 480mblue colored warfarin tablets all days of the week--EXCEPT on [facility] only one (1) tablet on Wednesdays. Patient verbalized understanding of these instructions.    Patient advised to contact clinic or seek medical attention if signs/symptoms of bleeding or thromboembolism occur.  Patient verbalized understanding by repeating back information and was advised to contact me if further medication-related questions arise. Patient was also provided an information handout.  Follow-up Return in 24 days (on 03/13/2021) for Follow up INR.  JaPennie BanterPharmD, CPP  15 minutes spent face-to-face with the patient during the encounter.  50% of time spent on education, including signs/sx bleeding and clotting, as well as food and drug interactions with warfarin. 50% of time was spent on fingerprick POC INR sample collection,processing, results determination, and documentation in [email or web address]

## 2021-02-20 NOTE — Telephone Encounter (Signed)
Sent in [place] forms via fax.  Will follow up.

## 2021-03-09 NOTE — Telephone Encounter (Signed)
Patient is getting Opsumit from [facility]. Called and confirmed the patient's co-pay is $0.  Charlann Boxer, CPhT

## 2021-03-13 ENCOUNTER — Ambulatory Visit: Payer: Medicare Other

## 2021-03-14 ENCOUNTER — Other Ambulatory Visit: Payer: Self-pay

## 2021-03-14 ENCOUNTER — Encounter (HOSPITAL_COMMUNITY): Payer: Self-pay | Admitting: Cardiology

## 2021-03-14 ENCOUNTER — Ambulatory Visit (HOSPITAL_COMMUNITY)
Admission: RE | Admit: 2021-03-14 | Discharge: 2021-03-14 | Disposition: A | Discharge status: Home / Self Care | Payer: Medicare Other | Source: Ambulatory Visit | Attending: Cardiology | Admitting: Cardiology

## 2021-03-14 ENCOUNTER — Telehealth (HOSPITAL_COMMUNITY): Payer: Self-pay | Admitting: Pharmacy Technician

## 2021-03-14 ENCOUNTER — Ambulatory Visit (INDEPENDENT_AMBULATORY_CARE_PROVIDER_SITE_OTHER): Payer: Medicare Other | Admitting: Pharmacist

## 2021-03-14 VITALS — BP 116/70 | HR 86 | Wt 191.6 lb

## 2021-03-14 DIAGNOSIS — I272 Pulmonary hypertension, unspecified: Secondary | ICD-10-CM | POA: Diagnosis not present

## 2021-03-14 DIAGNOSIS — Z86718 Personal history of other venous thrombosis and embolism: Secondary | ICD-10-CM | POA: Diagnosis not present

## 2021-03-14 DIAGNOSIS — T45515A Adverse effect of anticoagulants, initial encounter: Secondary | ICD-10-CM

## 2021-03-14 DIAGNOSIS — Z87891 Personal history of nicotine dependence: Secondary | ICD-10-CM | POA: Insufficient documentation

## 2021-03-14 DIAGNOSIS — R0602 Shortness of breath: Secondary | ICD-10-CM | POA: Insufficient documentation

## 2021-03-14 DIAGNOSIS — Z86711 Personal history of pulmonary embolism: Secondary | ICD-10-CM | POA: Insufficient documentation

## 2021-03-14 DIAGNOSIS — D6862 Lupus anticoagulant syndrome: Secondary | ICD-10-CM | POA: Diagnosis not present

## 2021-03-14 DIAGNOSIS — Z79899 Other long term (current) drug therapy: Secondary | ICD-10-CM | POA: Diagnosis not present

## 2021-03-14 DIAGNOSIS — G4733 Obstructive sleep apnea (adult) (pediatric): Secondary | ICD-10-CM | POA: Diagnosis not present

## 2021-03-14 DIAGNOSIS — Z7901 Long term (current) use of anticoagulants: Secondary | ICD-10-CM | POA: Insufficient documentation

## 2021-03-14 DIAGNOSIS — D869 Sarcoidosis, unspecified: Secondary | ICD-10-CM | POA: Insufficient documentation

## 2021-03-14 DIAGNOSIS — I110 Hypertensive heart disease with heart failure: Secondary | ICD-10-CM | POA: Diagnosis not present

## 2021-03-14 DIAGNOSIS — Z8616 Personal history of COVID-19: Secondary | ICD-10-CM | POA: Insufficient documentation

## 2021-03-14 DIAGNOSIS — I50812 Chronic right heart failure: Secondary | ICD-10-CM | POA: Insufficient documentation

## 2021-03-14 LAB — BASIC METABOLIC PANEL
Anion gap: 7 (ref 5–15)
BUN: 19 mg/dL (ref 8–23)
CO2: 37 mmol/L — ABNORMAL HIGH (ref 22–32)
Calcium: 9.3 mg/dL (ref 8.9–10.3)
Chloride: 94 mmol/L — ABNORMAL LOW (ref 98–111)
Creatinine, Ser: 1.4 mg/dL — ABNORMAL HIGH (ref 0.61–1.24)
GFR, Estimated: 55 mL/min — ABNORMAL LOW (ref >60)
Glucose, Bld: 88 mg/dL (ref 70–99)
Potassium: 4.3 mmol/L (ref 3.5–5.1)
Sodium: 138 mmol/L (ref 135–145)

## 2021-03-14 LAB — BRAIN NATRIURETIC PEPTIDE: B Natriuretic Peptide: 21 pg/mL (ref 0.0–100.0)

## 2021-03-14 LAB — POCT INR: INR: 3 (ref 2.0–3.0)

## 2021-03-14 MED ORDER — TORSEMIDE 20 MG PO TABS: ORAL_TABLET | ORAL | 3 refills | Status: DC

## 2021-03-14 NOTE — Telephone Encounter (Signed)
Patient was seen in clinic today and started on Uptravi. Sent referral to [facility] [employer]. Patient will need to fill the medication at [facility]

## 2021-03-14 NOTE — Progress Notes (Signed)
6 Min Walk Test Completed  Pt ambulated 198.1 meters  O2 Sat ranged 87%-97% on 2L oxygen HR ranged 77-107

## 2021-03-14 NOTE — Progress Notes (Signed)
Anticoagulation Management Jesus Russell is a 70 y.o. male who reports to the clinic for monitoring of warfarin treatment.    Indication: DVT, History of; PE, History of; Pulmonary Hypertension; Long term current anticoagulation with warfarin to maintain INR 2.0 - 3.0.  Duration: indefinite Supervising physician: Lalla Brothers  Anticoagulation Clinic Visit History: Patient does not report signs/symptoms of bleeding or thromboembolism  Other recent changes: No diet, medications, lifestyle changes endorsed by the patient at this visit.  Anticoagulation Episode Summary    Current INR goal:  2.0-3.0  TTR:  64.4 % (1.3 y)  Next INR check:  04/10/2021  INR from last check:  3.0 (03/14/2021)  Weekly max warfarin dose:    Target end date:  Indefinite  INR check location:    Preferred lab:    Send INR reminders to:     Indications   Long term (current) use of anticoagulants [Z79.01] History of DVT (deep vein thrombosis) [Z86.718] Pulmonary embolism on long-term anticoagulation therapy (HCC) [I26.99 T45.515A] History of pulmonary embolism [Z86.711]       Comments:          No Known Allergies  Current Outpatient Medications:  .  albuterol (VENTOLIN HFA) 108 (90 Base) MCG/ACT inhaler, Inhale 2 puffs into the lungs every 6 (six) hours as needed for wheezing or shortness of breath., Disp: 18 g, Rfl: 5 .  alendronate (FOSAMAX) 70 MG tablet, Take 1 tablet (70 mg total) by mouth once a week., Disp: 4 tablet, Rfl: 5 .  Ascorbic Acid (VITAMIN C) 500 MG CAPS, Take 500 mg by mouth daily., Disp: , Rfl:  .  cholecalciferol (VITAMIN D3) 25 MCG (1000 UT) tablet, Take 1,000 Units by mouth daily., Disp: , Rfl:  .  Fluticasone-Umeclidin-Vilant (TRELEGY ELLIPTA) 200-62.5-25 MCG/INH AEPB, Inhale 1 puff into the lungs daily., Disp: 60 each, Rfl: 5 .  macitentan (OPSUMIT) 10 MG tablet, Take 1 tablet (10 mg total) by mouth daily., Disp: 30 tablet, Rfl:  .  omega-3 acid ethyl esters (LOVAZA) 1 g capsule,  TAKE 1 CAPSULE(1 GRAM TOTAL) BY MOUTH TWICE DAILY, Disp: 180 capsule, Rfl: 0 .  potassium chloride (KLOR-CON) 20 MEQ packet, Take 40 mEq by mouth daily., Disp: , Rfl:  .  Riociguat (ADEMPAS) 2.5 MG TABS, Take 2.5 mg by mouth in the morning, at noon, and at bedtime., Disp: 90 tablet, Rfl: 11 .  torsemide (DEMADEX) 20 MG tablet, Take 80 mg in AM every other day ALTERNATING with 60 mg in AM every other day, take 40 mg every PM, Disp: 180 tablet, Rfl: 3 .  warfarin (COUMADIN) 4 MG tablet, TAKE 1  TABLET BY MOUTH ONCE DAILY, EXCEPT ON MONDAYS, TAKE ONLY 1/2 TABLET ON MONDAYS., Disp: 84 tablet, Rfl: 1 .  zinc gluconate 50 MG tablet, Take 50 mg by mouth daily., Disp: , Rfl:  Past Medical History:  Diagnosis Date  . Acute on chronic respiratory failure with hypoxia ([place])   . COVID-19 virus infection   . DVT of lower extremity, bilateral ([place])   . Epistaxis   . Hypertension   . Incarcerated umbilical hernia 72/5366   Status post repair  . Lupus anticoagulant positive    Per records from [facility]  . 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 Pulmonary artery hypertension ([place]) 04/2011   Mildly elevated pulmonary artery pressure in setting of PE (from [facility])  . Pulmonary embolism associated with COVID-19 ([place])   .  Subdural hematoma (HCC)    Remote episode, occurred when taking excedrin and warfarin.    Social History   Socioeconomic History  . Marital status: Divorced    Spouse name: Not on file  . Number of children: Not on file  . Years of education: Not on file  . Highest education level: Not on file  Occupational History  . Occupation: retired Pharmacist, hospital  Tobacco Use  . Smoking status: Former Smoker    Packs/day: 1.50    Years: 23.00    Pack years: 34.50    Types: Cigarettes    Quit date: 1995    Years since quitting: 27.2  . Smokeless tobacco: Never Used  Vaping Use  . Vaping Use: Never used  Substance and  Sexual Activity  . Alcohol use: Never  . Drug use: Not Currently    Types: "Crack" cocaine    Comment: quit 2008  . Sexual activity: Not on file  Other Topics Concern  . Not on file  Social History Narrative  . Not on file   Social Determinants of Health   Financial Resource Strain: Not on file  Food Insecurity: Not on file  Transportation Needs: Not on file  Physical Activity: Not on file  Stress: Not on file  Social Connections: Not on file   Family History  Adopted: Yes    ASSESSMENT Recent Results: The most recent result is correlated with 40 mg per week: Lab Results  Component Value Date   INR 3.0 03/14/2021   INR 1.5 (A) 02/17/2021   INR 2.1 01/23/2021    Anticoagulation Dosing: Description   Take one-and-one-half (1 & 1/2) of your 22m blue colored warfarin tablets all days of the week--EXCEPT on TUESDAYS and FRIDAYS, take only one (1) tablet on Tuesdays and Fridays.      INR today: Therapeutic  PLAN Weekly dose was decreased by 5% to 38 mg per week  Patient Instructions  Patient instructed to take medications as defined in the Anti-coagulation Track section of this encounter.  Patient instructed to take today's dose.  Patient instructed to take one-and-one-half (1 & 1/2) of your 415mblue colored warfarin tablets all days of the week--EXCEPT on TUESDAYS and FRIDAYS, take only one (1) tablet on Tuesdays and Fridays. Patient verbalized understanding of these instructions.    Patient advised to contact clinic or seek medical attention if signs/symptoms of bleeding or thromboembolism occur.  Patient verbalized understanding by repeating back information and was advised to contact me if further medication-related questions arise. Patient was also provided an information handout.  Follow-up Return in 27 days (on 04/10/2021) for Follow up INR.  JaPennie BanterPharmD, CPP  15 minutes spent face-to-face with the patient during the encounter. 50% of time spent  on education, including signs/sx bleeding and clotting, as well as food and drug interactions with warfarin. 50% of time was spent on fingerprick POC INR sample collection,processing, results determination, and documentation in [email or web address]

## 2021-03-14 NOTE — Patient Instructions (Signed)
Start taking Torsemide 40 mg (2 tabs) every PM, this is in addition to your AM dose  Start Malvin Johns, this is a specialty medication and must come from a specialty pharmacy. Once approved by your insurance company you will be contacted and a nurse with the specialty pharmacy will work with you on the dose and increasing the dose  Labs done today, your results will be available in [place], we will contact you for abnormal readings.  Your physician recommends that you schedule a follow-up appointment in: 1 week  Your physician recommends that you schedule a follow-up appointment in: 6 weeks with echocardiogram  If you have any questions or concerns before your next appointment please send [place] a message through [place] or call our office at [phone].    TO LEAVE A MESSAGE FOR THE NURSE SELECT OPTION 2, PLEASE LEAVE A MESSAGE INCLUDING: . YOUR NAME . DATE OF BIRTH . CALL BACK NUMBER . REASON FOR CALL**this is important as we prioritize the call backs  [place] AS LONG AS YOU CALL BEFORE 4:00 PM  At the [facility], you and your health needs are our priority. As part of our continuing mission to provide you with exceptional heart care, we have created designated Provider Care Teams. These Care Teams include your primary Cardiologist (physician) and Advanced Practice Providers (APPs- Physician Assistants and Nurse Practitioners) who all work together to provide you with the care you need, when you need it.   You may see any of the following providers on your designated Care Team at your next follow up: Marland Kitchen Dr Glori Bickers . Dr Loralie Champagne . Dr Vickki Muff . Darrick Grinder, NP . Lyda Jester, [place] . Audry Riles, PharmD   Please be sure to bring in all your medications bottles to every appointment.

## 2021-03-14 NOTE — Patient Instructions (Signed)
Patient instructed to take medications as defined in the Anti-coagulation Track section of this encounter.  Patient instructed to take today's dose.  Patient instructed to take one-and-one-half (1 & 1/2) of your 28m blue colored warfarin tablets all days of the week--EXCEPT on TUESDAYS and FRIDAYS, take only one (1) tablet on Tuesdays and Fridays. Patient verbalized understanding of these instructions.

## 2021-03-15 NOTE — Progress Notes (Signed)
INTERNAL MEDICINE TEACHING ATTENDING ADDENDUM  I agree with pharmacy recommendations as outlined in their note.   -Lalla Brothers MD

## 2021-03-15 NOTE — Progress Notes (Signed)
PCP: Madalyn Rob, MD HF Cardiology: Dr. Aundra Dubin  70 y.o. with history of sarcoidosis, prior PE, RV failure was referred by Dr. Shearon Stalls for evaluation of CHF/pulmonary hypertension.  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  Patient was on prednisone for a period of time, this was stopped earlier this year.  He is on 2 L home oxygen.  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 He has had recurrent PEs/DVTs.  He has an IVC filter.  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  Most recent PE was in 2/21 when he had been off warfarin transiently.  Last echo in 5/21 showed EF 55-60% with D-shaped septum and severely dilated/severely dysfunctional RV.  No estimation of PA pressure was available.    RHC in 7/21 showed normal PCWP and moderate PAH with PVR 5 WU. V/Q showed perfusion defects but thought to be due to sarcoidosis.  CTA chest in 7/21 showed no acute or chronic PE, signs of chronic sarcoidosis were present.   Patient presents for followup of pulmonary hypertension/RV failure.  He is now using CPAP.  Weight is up 3 lbs.  He continues to use home oxygen.  Tyvaso made him feel worse so he stopped it.  He started on Opsumit and has tolerated it.  Still with abdominal and ankle swelling.  He is doing better symptomatically, able to do chores around the house and able to shower without dyspnea.  He does get short of breath after walking 100-200 feet.  No chest pain.  No lightheadedness/syncope.     Labs (6/21): pro-BNP 43, K 4.6, creatinine 0.89 Labs (8/21): hgb 10.5, K 3.8, creatinine 1.28 Labs (10/21): K 5.4, creatinine 1.1 Labs (11/21): K 3.3, creatinine 1.14 Labs (1/22): BNP 365 Labs (2/22): K 4, creatinine 1.2  6 minute walk (11/21): 76 m 6 minute walk (1/22): 183 m 6 minute walk (3/22): 62 m  PMH: 1. Sarcoidosis: "Burned out."  Diagnosed in 1990 with bronchoscopy/biopsy.  Treated with prednisone, now off.  - On 2 L  home oxygen chronically.  2. 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 H/o DVT and PE.  Has lupus anticoagulant.   - Most recent PE was in 2/21, he had been off warfarin transiently.  - V/Q scan in 7/21 with perfusion defects, CTA in 7/21 showed no acute or chronic thrombi, signs of sarcoidosis were present.  4. COVID-19 infection 1/21.  5. H/o compression fractures in 2/21.  - Had kyphoplasty 6. RV failure/pulmonary hypertension:  - Echo (5/21) with EF 55-60%, D-shaped interventricular septum, severely dilated RV with severe systolic dysfunction, dilated IVC.  - RHC (7/21) with mean RA 9, PA 62/21 mean 39, mean PCWP 11, CI 2.64, PVR 5 WU.  - Did not tolerate Tyvaso.  7. HTN 8. History of subdural hematoma remotely.   Social History   Socioeconomic History  . Marital status: Divorced    Spouse name: Not on file  . Number of children: Not on file  . Years of education: Not on file  . Highest education level: Not on file  Occupational History  . Occupation: retired Pharmacist, hospital  Tobacco Use  . Smoking status: Former Smoker    Packs/day: 1.50    Years: 23.00    Pack years: 34.50    Types: Cigarettes    Quit date: 1995    Years since quitting: 27.2  . Smokeless tobacco: Never Used  Vaping Use  . Vaping Use: Never used  Substance and  Sexual Activity  . Alcohol use: Never  . Drug use: Not Currently    Types: "Crack" cocaine    Comment: quit 2008  . Sexual activity: Not on file  Other Topics Concern  . Not on file  Social History Narrative  . Not on file   Social Determinants of Health   Financial Resource Strain: Not on file  Food Insecurity: Not on file  Transportation Needs: Not on file  Physical Activity: Not on file  Stress: Not on file  Social Connections: Not on file  Intimate Partner Violence: Not on file   Family History  Adopted: Yes   ROS: All systems reviewed and negative except as per HPI.   Current Outpatient Medications  Medication Sig  Dispense Refill  . albuterol (VENTOLIN HFA) 108 (90 Base) MCG/ACT inhaler Inhale 2 puffs into the lungs every 6 (six) hours as needed for wheezing or shortness of breath. 18 g 5  . alendronate (FOSAMAX) 70 MG tablet Take 1 tablet (70 mg total) by mouth once a week. 4 tablet 5  . Ascorbic Acid (VITAMIN C) 500 MG CAPS Take 500 mg by mouth daily.    . cholecalciferol (VITAMIN D3) 25 MCG (1000 UT) tablet Take 1,000 Units by mouth daily.    . Fluticasone-Umeclidin-Vilant (TRELEGY ELLIPTA) 200-62.5-25 MCG/INH AEPB Inhale 1 puff into the lungs daily. 60 each 5  . macitentan (OPSUMIT) 10 MG tablet Take 1 tablet (10 mg total) by mouth daily. 30 tablet   . omega-3 acid ethyl esters (LOVAZA) 1 g capsule TAKE 1 CAPSULE(1 GRAM TOTAL) BY MOUTH TWICE DAILY 180 capsule 0  . potassium chloride (KLOR-CON) 20 MEQ packet Take 40 mEq by mouth daily.    . Riociguat (ADEMPAS) 2.5 MG TABS Take 2.5 mg by mouth in the morning, at noon, and at bedtime. 90 tablet 11  . warfarin (COUMADIN) 4 MG tablet TAKE 1  TABLET BY MOUTH ONCE DAILY, EXCEPT ON MONDAYS, TAKE ONLY 1/2 TABLET ON MONDAYS. 84 tablet 1  . zinc gluconate 50 MG tablet Take 50 mg by mouth daily.    Marland Kitchen torsemide (DEMADEX) 20 MG tablet Take 80 mg in AM every other day ALTERNATING with 60 mg in AM every other day, take 40 mg every PM 180 tablet 3   No current facility-administered medications for this encounter.   BP 116/70   Pulse 86   Wt 86.9 kg (191 lb 9.6 oz)   SpO2 96% Comment: 2 l n/c  BMI 31.88 kg/m  General: NAD Neck: JVP 9-10 cm with HJR, no thyromegaly or thyroid nodule.  Lungs: Clear to auscultation bilaterally with normal respiratory effort. CV: Nondisplaced PMI.  Heart regular S1/S2, no S3/S4, no murmur.  1+ edema 1/2 to knees bilaterally.  No carotid bruit.  Normal pedal pulses.  Abdomen: Soft, nontender, no hepatosplenomegaly, mild abdominal distention.  Skin: Intact without lesions or rashes.  Neurologic: Alert and oriented x 3.  Psych: Normal  affect. Extremities: No clubbing or cyanosis.  HEENT: Normal.   Assessment/Plan: 1. RV failure, pulmonary hypertension: Echo in 5/21 showed EF 55-60%, D-shaped septum with severe RV dilation/severe RV dysfunction.  [place] in 7/21 showed mildly elevated RA pressure, moderate PAH with PVR 5 WU.  V/Q scan showed perfusion defects, but CTA chest in 7/21 showed no acute or chronic PE, V/Q findings were likely due to chronic sarcoidosis changes in lungs.  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  6 minute walk today  slightly better.  He did not tolerate Tyvaso. He is volume overloaded on exam today.  NYHA class III symptoms chronically.  - Continue torsemide 80 mg qam alternating with 60 mg qam.  I will add torsemide 40 mg qpm.  Add KCl 20 daily. BMET/BNP today, BMET in 10 days.    - Continue Opsumit 10 mg daily.  - Continue Adempas 2.5 mg tid.   - He has improved since starting [place] meds.  I will see if he can tolerate Uptravi as the next step.  - Continue home oxygen and CPAP.  - Repeat echo at followup in 6 wks.  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  Has positive lupus anticoagulant.  - Continue warfarin, should have Lovenox bridge if stops warfarin (had PE when off warfarin transiently in 2/21).  3. Sarcoidosis: History of "burned out" pulmonary sarcoidosis.  This may be the cause of his pulmonary hypertension.  No definitive evidence for cardiac sarcoidosis, but could arrange for cardiac MRI in the future to investigate. He is no longer on prednisone.  - Continue 2 L home oxygen.  4. HTN: Continue current regimen,  BP controlled.  5. OSA: Use CPAP.   Followup in 6 wks with echo.   Loralie Champagne 03/15/2021

## 2021-03-20 NOTE — Telephone Encounter (Signed)
PA for Uptravi sent in via fax at [phone].  [employer] called in requesting more information, provided that information over the phone. Representative stated that we would receive a determination via fax once a determination is made.

## 2021-03-21 NOTE — Telephone Encounter (Signed)
Advanced Heart Failure Patient Advocate Encounter  Prior Authorization for Jesus Russell has been approved.    Effective dates: 03/21/21 through 12/23/21  Approval sent to [facility]

## 2021-03-22 ENCOUNTER — Ambulatory Visit (HOSPITAL_COMMUNITY)
Admission: RE | Admit: 2021-03-22 | Discharge: 2021-03-22 | Disposition: A | Discharge status: Home / Self Care | Payer: Medicare Other | Source: Ambulatory Visit | Attending: Cardiology | Admitting: Cardiology

## 2021-03-22 ENCOUNTER — Other Ambulatory Visit: Payer: Self-pay

## 2021-03-22 DIAGNOSIS — I272 Pulmonary hypertension, unspecified: Secondary | ICD-10-CM | POA: Insufficient documentation

## 2021-03-22 DIAGNOSIS — I50812 Chronic right heart failure: Secondary | ICD-10-CM | POA: Diagnosis present

## 2021-03-22 LAB — BASIC METABOLIC PANEL
Anion gap: 9 (ref 5–15)
BUN: 20 mg/dL (ref 8–23)
CO2: 35 mmol/L — ABNORMAL HIGH (ref 22–32)
Calcium: 9.2 mg/dL (ref 8.9–10.3)
Chloride: 94 mmol/L — ABNORMAL LOW (ref 98–111)
Creatinine, Ser: 1.49 mg/dL — ABNORMAL HIGH (ref 0.61–1.24)
GFR, Estimated: 51 mL/min — ABNORMAL LOW (ref >60)
Glucose, Bld: 96 mg/dL (ref 70–99)
Potassium: 3.9 mmol/L (ref 3.5–5.1)
Sodium: 138 mmol/L (ref 135–145)

## 2021-03-27 ENCOUNTER — Telehealth (HOSPITAL_COMMUNITY): Payer: Self-pay | Admitting: Pharmacy Technician

## 2021-03-27 NOTE — Telephone Encounter (Signed)
Received an application from [employer] for Uptravi assistance.  Called and spoke with the patient who is aware that we need his signature and income information in order to fax in the assistance application.  Will fax in once all signatures are obtained.

## 2021-04-03 ENCOUNTER — Telehealth (HOSPITAL_COMMUNITY): Payer: Self-pay | Admitting: Pharmacist

## 2021-04-03 NOTE — Telephone Encounter (Signed)
Received message from [facility]. Patient was started on Uptravi and has tolerated the medication well.   Audry Riles, PharmD, BCPS, BCCP, CPP Heart Failure Clinic Pharmacist [phone]

## 2021-04-10 ENCOUNTER — Ambulatory Visit (INDEPENDENT_AMBULATORY_CARE_PROVIDER_SITE_OTHER): Payer: Medicare Other | Admitting: Pharmacist

## 2021-04-10 ENCOUNTER — Other Ambulatory Visit: Payer: Self-pay | Admitting: Internal Medicine

## 2021-04-10 DIAGNOSIS — Z86718 Personal history of other venous thrombosis and embolism: Secondary | ICD-10-CM

## 2021-04-10 DIAGNOSIS — Z7901 Long term (current) use of anticoagulants: Secondary | ICD-10-CM | POA: Diagnosis present

## 2021-04-10 DIAGNOSIS — I2699 Other pulmonary embolism without acute cor pulmonale: Secondary | ICD-10-CM

## 2021-04-10 DIAGNOSIS — Z86711 Personal history of pulmonary embolism: Secondary | ICD-10-CM

## 2021-04-10 LAB — POCT INR: INR: 2.7 (ref 2.0–3.0)

## 2021-04-10 NOTE — Patient Instructions (Signed)
Patient instructed to take medications as defined in the Anti-coagulation Track section of this encounter.  Patient instructed to take today's dose.  Patient instructed to take  one-and-one-half (1 & 1/2) of your 92m blue colored warfarin tablets all days of the week--EXCEPT on TUESDAYS, [facility] FR[facility], take only one (1) tablet on Tuesdays, Wednesdays and Fridays.  Patient verbalized understanding of these instructions.

## 2021-04-10 NOTE — Progress Notes (Signed)
Anticoagulation Management Jesus Russell is a 70 y.o. male who reports to the clinic for monitoring of warfarin treatment.    Indication: DVT, History of; PE, History of; Long term current use of oral anticoagulation warfarin with target INR 2.0 - 3.0.  Duration: indefinite Supervising physician: Aldine Contes  Anticoagulation Clinic Visit History: Patient does not report signs/symptoms of bleeding or thromboembolism  Other recent changes: No diet, medications, lifestyle changes.  Anticoagulation Episode Summary    Current INR goal:  2.0-3.0  TTR:  66.3 % (1.4 y)  Next INR check:  05/08/2021  INR from last check:  2.7 (04/10/2021)  Weekly max warfarin dose:    Target end date:  Indefinite  INR check location:    Preferred lab:    Send INR reminders to:     Indications   Long term (current) use of anticoagulants [Z79.01] History of DVT (deep vein thrombosis) [Z86.718] Pulmonary embolism on long-term anticoagulation therapy (HCC) [I26.99 T45.515A] History of pulmonary embolism [Z86.711]       Comments:          No Known Allergies  Current Outpatient Medications:  .  albuterol (VENTOLIN HFA) 108 (90 Base) MCG/ACT inhaler, Inhale 2 puffs into the lungs every 6 (six) hours as needed for wheezing or shortness of breath., Disp: 18 g, Rfl: 5 .  alendronate (FOSAMAX) 70 MG tablet, Take 1 tablet (70 mg total) by mouth once a week., Disp: 4 tablet, Rfl: 5 .  Ascorbic Acid (VITAMIN C) 500 MG CAPS, Take 500 mg by mouth daily., Disp: , Rfl:  .  cholecalciferol (VITAMIN D3) 25 MCG (1000 UT) tablet, Take 1,000 Units by mouth daily., Disp: , Rfl:  .  Fluticasone-Umeclidin-Vilant (TRELEGY ELLIPTA) 200-62.5-25 MCG/INH AEPB, Inhale 1 puff into the lungs daily., Disp: 60 each, Rfl: 5 .  macitentan (OPSUMIT) 10 MG tablet, Take 1 tablet (10 mg total) by mouth daily., Disp: 30 tablet, Rfl:  .  omega-3 acid ethyl esters (LOVAZA) 1 g capsule, TAKE 1 CAPSULE(1 GRAM TOTAL) BY MOUTH TWICE DAILY,  Disp: 180 capsule, Rfl: 0 .  potassium chloride (KLOR-CON) 20 MEQ packet, Take 40 mEq by mouth daily., Disp: , Rfl:  .  Riociguat (ADEMPAS) 2.5 MG TABS, Take 2.5 mg by mouth in the morning, at noon, and at bedtime., Disp: 90 tablet, Rfl: 11 .  Selexipag 200 & 800 MCG TBPK, Start 200 mcg BID. Increase by 200 mcg BID every 1-2 weeks as tolerated to maximum dose of 1600 mcg BID., Disp: , Rfl:  .  torsemide (DEMADEX) 20 MG tablet, Take 80 mg in AM every other day ALTERNATING with 60 mg in AM every other day, take 40 mg every PM, Disp: 180 tablet, Rfl: 3 .  warfarin (COUMADIN) 4 MG tablet, TAKE 1  TABLET BY MOUTH ONCE DAILY, EXCEPT ON MONDAYS, TAKE ONLY 1/2 TABLET ON MONDAYS., Disp: 84 tablet, Rfl: 1 .  zinc gluconate 50 MG tablet, Take 50 mg by mouth daily., Disp: , Rfl:  Past Medical History:  Diagnosis Date  . Acute on chronic respiratory failure with hypoxia ([place])   . COVID-19 virus infection   . DVT of lower extremity, bilateral ([place])   . Epistaxis   . Hypertension   . Incarcerated umbilical hernia 28/4132   Status post repair  . Lupus anticoagulant positive    Per records from [facility]  . 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 Pulmonary embolism associated with COVID-19 ([place])   . Subdural hematoma (HCC)    Remote episode, occurred when taking excedrin and warfarin.    Social History   Socioeconomic History  . Marital status: Divorced    Spouse name: Not on file  . Number of children: Not on file  . Years of education: Not on file  . Highest education level: Not on file  Occupational History  . Occupation: retired Pharmacist, hospital  Tobacco Use  . Smoking status: Former Smoker    Packs/day: 1.50    Years: 23.00    Pack years: 34.50    Types: Cigarettes    Quit date: 1995    Years since  quitting: 27.3  . Smokeless tobacco: Never Used  Vaping Use  . Vaping Use: Never used  Substance and Sexual Activity  . Alcohol use: Never  . Drug use: Not Currently    Types: "Crack" cocaine    Comment: quit 2008  . Sexual activity: Not on file  Other Topics Concern  . Not on file  Social History Narrative  . Not on file   Social Determinants of Health   Financial Resource Strain: Not on file  Food Insecurity: Not on file  Transportation Needs: Not on file  Physical Activity: Not on file  Stress: Not on file  Social Connections: Not on file   Family History  Adopted: Yes    ASSESSMENT Recent Results: The most recent result is correlated with 38 mg per week: Lab Results  Component Value Date   INR 2.7 04/10/2021   INR 3.0 03/14/2021   INR 1.5 (A) 02/17/2021    Anticoagulation Dosing: Description   Take one-and-one-half (1 & 1/2) of your 47m blue colored warfarin tablets all days of the week--EXCEPT on TUESDAYS, [facility] FR[facility], take only one (1) tablet on Tuesdays, Wednesdays and Fridays.      INR today: Therapeutic  PLAN Weekly dose was decreased by 11% to 34 mg per week  Patient Instructions  Patient instructed to take medications as defined in the Anti-coagulation Track section of this encounter.  Patient instructed to take today's dose.  Patient instructed to take  one-and-one-half (1 & 1/2) of your 430mblue colored warfarin tablets all days of the week--EXCEPT on T[facility]DAYS, [facility] FRIDAYS, take only one (1) tablet on Tuesdays, Wednesdays and Fridays.  Patient verbalized understanding of these instructions.    Patient advised to contact clinic or seek medical attention if signs/symptoms of bleeding or thromboembolism occur.  Patient verbalized understanding by repeating back information and was advised to contact me if further medication-related questions arise. Patient was also provided an information handout.  Follow-up Return in 4 weeks  (on 05/08/2021) for Follow up INR.  JaPennie BanterPharmD, CPP  15 minutes spent face-to-face with the patient during the encounter. 50% of time spent on education, including signs/sx bleeding and clotting, as well as food and drug interactions with warfarin. 50% of time was spent on fingerprick POC INR sample collection,processing, results determination, and documentation in [email or web address]

## 2021-04-10 NOTE — Progress Notes (Signed)
INTERNAL MEDICINE TEACHING ATTENDING ADDENDUM - Aleaha Fickling M.D  Duration- indefinite, Indication- PE, DVT, INR- therapeutic. Agree with pharmacy recommendations as outlined in their note.      

## 2021-04-12 NOTE — Telephone Encounter (Signed)
Next appt scheduled 5/16 with PCP.

## 2021-04-17 ENCOUNTER — Other Ambulatory Visit: Payer: Self-pay

## 2021-04-17 ENCOUNTER — Ambulatory Visit (HOSPITAL_COMMUNITY)
Admission: RE | Admit: 2021-04-17 | Discharge: 2021-04-17 | Disposition: A | Discharge status: Home / Self Care | Payer: Medicare Other | Source: Ambulatory Visit | Attending: Cardiology | Admitting: Cardiology

## 2021-04-17 DIAGNOSIS — I50812 Chronic right heart failure: Secondary | ICD-10-CM | POA: Insufficient documentation

## 2021-04-17 LAB — SEDIMENTATION RATE: Sed Rate: 67 mm/hr — ABNORMAL HIGH (ref 0–16)

## 2021-04-17 NOTE — Progress Notes (Addendum)
Jesus Russell Clinic Note  04/18/2021     CHIEF COMPLAINT Patient presents for Retina Evaluation   HISTORY OF PRESENT ILLNESS: Jesus Russell is a 70 y.o. male who presents to the clinic today for:   HPI    Retina Evaluation    In both eyes.  This started weeks ago.  Duration of weeks.  Context:  distance vision and near vision.          Comments    Pt states he had very successful CE OS with Dr. Herbert Russell and plan was to have CE OD.  Pt states vision is cloudy OD, secondary to cataract.  Pt states he sleeps with CPAP and believes pressure on face and eye from CPAP may have caused heme.  Pt denies eye pain.  Pt denies floaters or fol OU.  Pt has history of pulmonary hypertension, COPD, pulmonary sarcoidosis.   Using drop with yellow cap QD OS--did not use this morning.  Was using BID OS and now QD OS, following CE.       Last edited by Jesus Russell on 04/18/2021  8:41 AM. (History)    pt is here on the referral of Dr. Herbert Russell for retinal eval, pt had cataract sx OS with Dr. Herbert Russell and states it was "very successful", pt states Dr. Herbert Russell noticed bleeding in the back of his eyes, he states he takes 33m Warfarin, but no aspiring for blood clots, pt denies being diabetic or hypertensive  Referring physician: [name] [address]  [place] [id number] HISTORICAL INFORMATION:   Selected notes from the MEDICAL RECORD NUMBER Referred by Dr. HHerbert Deanerfor ret eval   CURRENT MEDICATIONS: No current outpatient medications on file. (Ophthalmic Drugs)   No current facility-administered medications for this visit. (Ophthalmic Drugs)   Current Outpatient Medications (Other)  Medication Sig  . albuterol (VENTOLIN HFA) 108 (90 Base) MCG/ACT inhaler Inhale 2 puffs into the lungs every 6 (six) hours as needed for wheezing or shortness of breath.  .Marland Kitchenalendronate (FOSAMAX) 70 MG tablet Take 1 tablet (70 mg total) by mouth once a week.  . Ascorbic  Acid (VITAMIN C) 500 MG CAPS Take 500 mg by mouth daily.  . cholecalciferol (VITAMIN D3) 25 MCG (1000 UT) tablet Take 1,000 Units by mouth daily.  . Fluticasone-Umeclidin-Vilant (TRELEGY ELLIPTA) 200-62.5-25 MCG/INH AEPB Inhale 1 puff into the lungs daily.  . macitentan (OPSUMIT) 10 MG tablet Take 1 tablet (10 mg total) by mouth daily.  .Marland Kitchenomega-3 acid ethyl esters (LOVAZA) 1 g capsule TAKE 1 CAPSULE BY MOUTH TWICE DAILY  . potassium chloride (KLOR-CON) 20 MEQ packet Take 40 mEq by mouth daily.  . Riociguat (ADEMPAS) 2.5 MG TABS Take 2.5 mg by mouth in the morning, at noon, and at bedtime.  . Selexipag 200 & 800 MCG TBPK Start 200 mcg BID. Increase by 200 mcg BID every 1-2 weeks as tolerated to maximum dose of 1600 mcg BID.  .Marland Kitchentorsemide (DEMADEX) 20 MG tablet Take 80 mg in AM every other day ALTERNATING with 60 mg in AM every other day, take 40 mg every PM  . warfarin (COUMADIN) 4 MG tablet TAKE 1  TABLET BY MOUTH ONCE DAILY, EXCEPT ON MONDAYS, TAKE ONLY 1/2 TABLET ON MONDAYS.  .Marland Kitchenzinc gluconate 50 MG tablet Take 50 mg by mouth daily.   No current facility-administered medications for this visit. (Other)      REVIEW OF SYSTEMS: ROS  Positive for: Musculoskeletal, Cardiovascular, Eyes, Respiratory   Negative for: Constitutional, Gastrointestinal, Neurological, Skin, Genitourinary, HENT, Endocrine, Psychiatric, Allergic/Imm, Heme/Lymph   Last edited by Jesus Russell on 04/18/2021  8:16 AM. (History)       ALLERGIES No Known Allergies  PAST MEDICAL HISTORY Past Medical History:  Diagnosis Date  . Acute on chronic respiratory failure with hypoxia ([place])   . COVID-19 virus infection   . DVT of lower extremity, bilateral ([place])   . Epistaxis   . Hypertension   . Incarcerated umbilical hernia 43/8887   Status post repair  . Lupus anticoagulant positive    Per records from [facility]  . 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 Pulmonary artery hypertension ([place]) 04/2011   Mildly elevated pulmonary artery pressure in setting of PE (from [facility])  . Pulmonary embolism associated with COVID-19 ([place])   . Subdural hematoma (HCC)    Remote episode, occurred when taking excedrin and warfarin.    Past Surgical History:  Procedure Laterality Date  . BRAIN SURGERY    . HERNIA REPAIR    . IR KYPHO EA ADDL LEVEL THORACIC OR LUMBAR  05/04/2020  . IR KYPHO EA ADDL LEVEL THORACIC OR LUMBAR  05/04/2020  . IR KYPHO THORACIC WITH BONE BIOPSY  05/04/2020  . IR RADIOLOGIST EVAL & MGMT  04/07/2020  . IR RADIOLOGIST EVAL & MGMT  06/08/2020  . NASAL ENDOSCOPY WITH EPISTAXIS CONTROL N/A 02/24/2020   Procedure: NASAL ENDOSCOPY WITH EPISTAXIS CONTROL;  Surgeon: Jesus Quitter, MD;  Location: [facility] OR;  Service: ENT;  Laterality: N/A;  . RIGHT HEART CATH N/A 07/06/2020   Procedure: RIGHT HEART CATH;  Surgeon: Jesus Dresser, MD;  Location: [place] [facility];  Service: Cardiovascular;  Laterality: N/A;    FAMILY HISTORY Family History  Adopted: Yes    SOCIAL HISTORY Social History   Tobacco Use  . Smoking status: Former Smoker    Packs/day: 1.50    Years: 23.00    Pack years: 34.50    Types: Cigarettes    Quit date: 1995    Years since quitting: 27.3  . Smokeless tobacco: Never Used  Vaping Use  . Vaping Use: Never used  Substance Use Topics  . Alcohol use: Never  . Drug use: Not Currently    Types: "Crack" cocaine    Comment: quit 2008         OPHTHALMIC EXAM:  Base Eye Exam    Visual Acuity (Snellen - Linear)      Right Left   Dist [place]  20/40 -1   Dist cc 20/25 -2    Dist ph [place]  NI   Dist ph cc NI        Pupils      Dark Light Shape React APD   Right 3 2 Round Brisk 0   Left 3 2 Round Brisk 0       Visual Fields      Left Right    Full Full       Extraocular Movement      Right Left    Full Full       Neuro/Psych    Oriented x3: Yes   Mood/Affect: Normal        Slit Lamp and  Fundus Exam    Slit Lamp Exam      Right Left   Lids/Lashes Dermatochalasis - upper lid  Dermatochalasis - upper lid, mild Ptosis   Conjunctiva/Sclera mild melanosis mild melanosis   Cornea arcus, trace PEE arcus, trace PEE, well healed cataract wounds   Anterior Chamber Deep and quiet Deep and quiet   Iris Round and dilated Round and dilated   Lens 2-3+ Nuclear sclerosis, 2+ Cortical cataract PC IOL in good position   Vitreous Vitreous syneresis, Posterior vitreous detachment Vitreous syneresis, Posterior vitreous detachment       Fundus Exam      Right Left   Disc Pink and Sharp, Compact, disc heme at 0730, CWS inferior to disc mild Pallor, Sharp rim, focal temporal PPP   C/D Ratio 0.2 0.2   Macula Flat, Blunted foveal reflex, CWS/IRH IN mac Flat, Good foveal reflex, mild RPE mottling and clumping, No heme or edema   Vessels attenuated, Copper wiring, AV crossing changes attenuated, Copper wiring, AV crossing changes, mild tortuousity   Periphery Attached, focal SRH and CWS superior to disc    Attached, focal blot heme nasal to disc           Refraction    Manifest Refraction      Sphere Cylinder Axis Dist VA   Right +1.00 +1.00 055 20/25-2   Left -1.00 Sphere  20/25          IMAGING AND PROCEDURES  Imaging and Procedures for 04/18/2021  OCT, Retina - OU - Both Eyes       Right Eye Quality was good. Central Foveal Thickness: 229. Progression has no prior data. Findings include normal foveal contour, no IRF, no SRF, intraretinal hyper-reflective material (Mild IRHM).   Left Eye Quality was good. Central Foveal Thickness: 234. Progression has no prior data. Findings include normal foveal contour, no IRF, no SRF, intraretinal hyper-reflective material (Mild IRHM).   Notes *Images captured and stored on drive  Diagnosis / Impression:  NFP; no IRF/SRF OU Mild IRHM OU  Clinical management:  See below  Abbreviations: NFP - Normal foveal profile. CME - cystoid macular  edema. PED - pigment epithelial detachment. IRF - intraretinal fluid. SRF - subretinal fluid. EZ - ellipsoid zone. ERM - epiretinal membrane. ORA - outer retinal atrophy. ORT - outer retinal tubulation. SRHM - subretinal hyper-reflective material. IRHM - intraretinal hyper-reflective material        Fluorescein Angiography Optos (Transit OD)       Right Eye   Progression has no prior data. Early phase findings include blockage (Delayed venous return). Mid/Late phase findings include leakage (Focal peripapillary leakage inferior disc).   Left Eye   Progression has no prior data. Early phase findings include normal observations. Mid/Late phase findings include normal observations.   Notes **Images stored on drive**  Impression: OD: Focal peripapillary late leakage inferior disc OS: normal study                  ASSESSMENT/PLAN:    ICD-10-CM   1. Retinal hemorrhage, bilateral  H35.63   2. Retinal edema  H35.81 OCT, Retina - OU - Both Eyes  3. Essential hypertension  I10   4. Hypertensive retinopathy of both eyes  H35.033 Fluorescein Angiography Optos (Transit OD)  5. Combined forms of age-related cataract of right eye  H25.811   6. Pseudophakia  Z96.1     1,2. Peripapillary retinal hemorrhage OU (OD>OS)  - OD with peripapillary blot hemes and CWS  - OS w/ mild focal peripapillary blot heme nasal to disc   - no significant macular edema and no immediate threat to  vision  - FA 4.26.22 shows mild, late peripapillary leakage inferior to disc OD, OS normal study  - broad differential but appearance suggestive of hypertensive retinopathy  - pt is also on coumadin for h/o DVT and PE -- likely contributing to hemorrhages  - discussed findings, prognosis  - no treatment indicated or recommended at this time  - recommend monitoring for now  - clear from a retina standpoint to proceed with cataract surgery when pt and surgeon are ready  - f/u here in 6 wks, sooner prn --  DFE/OCT  3,4. Hypertensive retinopathy OU  - BP well controlled -- 103/66 in office today - discussed importance of tight BP control and possible contribution to retinal hemes - monitor  5. Mixed Cataract OD - The symptoms of cataract, surgical options, and treatments and risks were discussed with patient. - discussed diagnosis and progression - clear from a retina standpoint to proceed with cataract surgery when pt and surgeon are ready - surgery scheduled for Tuesday, 05.03.21 with Dr. Herbert Russell  6. Pseudophakia OS  - s/p CE/IOL OS (Dr. Herbert Russell)  - IOL in good position, doing well  - monitor  Ophthalmic Meds Ordered this visit:  No orders of the defined types were placed in this encounter.      Return in about 6 weeks (around 05/30/2021).  There are no Patient Instructions on file for this visit.   Explained the diagnoses, plan, and follow up with the patient and they expressed understanding.  Patient expressed understanding of the importance of proper follow up care.   This document serves as a record of services personally performed by Gardiner Sleeper, MD, PhD. It was created on their behalf by Estill Bakes, COT an ophthalmic technician. The creation of this record is the provider's dictation and/or activities during the visit.    Electronically signed by: Estill Bakes, COT 4.25.22 @ 11:06 PM   This document serves as a record of services personally performed by Gardiner Sleeper, MD, PhD. It was created on their behalf by [place] Jet[place]. Owens Shark, OA an ophthalmic technician. The creation of this record is the provider's dictation and/or activities during the visit.    Electronically signed by: San Jetty. Owens Shark, [place] 04.26.2022 11:06 PM   Gardiner Sleeper, M.D., Ph.D. Diseases & Surgery of the Retina and Vitreous [employer]  I have reviewed the above documentation for accuracy and completeness, and I agree with the above. Gardiner Sleeper, M.D., Ph.D. 04/18/21  11:06 PM  Abbreviations: M myopia (nearsighted); A astigmatism; H hyperopia (farsighted); P presbyopia; Mrx spectacle prescription;  CTL contact lenses; OD right eye; OS left eye; OU both eyes  XT exotropia; ET esotropia; PEK punctate epithelial keratitis; PEE punctate epithelial erosions; DES dry eye syndrome; MGD meibomian gland dysfunction; ATs artificial tears; PFAT's preservative free artificial tears; [place] nuclear sclerotic cataract; PSC posterior subcapsular cataract; ERM epi-retinal membrane; PVD posterior vitreous detachment; RD retinal detachment; DM diabetes mellitus; DR diabetic retinopathy; NPDR non-proliferative diabetic retinopathy; PDR proliferative diabetic retinopathy; CSME clinically significant macular edema; DME diabetic macular edema; dbh dot blot hemorrhages; CWS cotton wool spot; POAG primary open angle glaucoma; C/D cup-to-disc ratio; HVF humphrey visual field; GVF goldmann visual field; OCT optical coherence tomography; IOP intraocular pressure; BRVO Branch retinal vein occlusion; CRVO central retinal vein occlusion; CRAO central retinal artery occlusion; BRAO branch retinal artery occlusion; RT retinal tear; SB scleral buckle; PPV pars plana vitrectomy; VH Vitreous hemorrhage; PRP panretinal laser photocoagulation; IVK intravitreal kenalog;  VMT vitreomacular traction; MH Macular hole;  NVD neovascularization of the disc; NVE neovascularization elsewhere; AREDS age related eye disease study; ARMD age related macular degeneration; POAG primary open angle glaucoma; EBMD epithelial/anterior basement membrane dystrophy; ACIOL anterior chamber intraocular lens; IOL intraocular lens; PCIOL posterior chamber intraocular lens; Phaco/IOL phacoemulsification with intraocular lens placement; PRK photorefractive keratectomy; LASIK laser assisted in situ keratomileusis; HTN hypertension; DM diabetes mellitus; COPD chronic obstructive pulmonary disease 

## 2021-04-18 ENCOUNTER — Encounter (INDEPENDENT_AMBULATORY_CARE_PROVIDER_SITE_OTHER): Payer: Self-pay | Admitting: Ophthalmology

## 2021-04-18 ENCOUNTER — Ambulatory Visit (INDEPENDENT_AMBULATORY_CARE_PROVIDER_SITE_OTHER): Payer: Medicare Other | Admitting: Ophthalmology

## 2021-04-18 VITALS — BP 103/66 | HR 80

## 2021-04-18 DIAGNOSIS — H35033 Hypertensive retinopathy, bilateral: Secondary | ICD-10-CM | POA: Diagnosis not present

## 2021-04-18 DIAGNOSIS — H3581 Retinal edema: Secondary | ICD-10-CM

## 2021-04-18 DIAGNOSIS — I10 Essential (primary) hypertension: Secondary | ICD-10-CM | POA: Diagnosis not present

## 2021-04-18 DIAGNOSIS — H3563 Retinal hemorrhage, bilateral: Secondary | ICD-10-CM

## 2021-04-18 DIAGNOSIS — Z961 Presence of intraocular lens: Secondary | ICD-10-CM

## 2021-04-18 DIAGNOSIS — H25811 Combined forms of age-related cataract, right eye: Secondary | ICD-10-CM

## 2021-04-20 ENCOUNTER — Telehealth (HOSPITAL_COMMUNITY): Payer: Self-pay | Admitting: Pharmacy Technician

## 2021-04-20 NOTE — Telephone Encounter (Signed)
Received a call from [facility]. The patient is tolerating Uptravi well and not having any significant side effects or issues. Being up titrated to 820mg bid. He has not noticed any improvement in his breathing yet but hopeful as we continue to titrate up he will.  ECharlann Boxer CPhT

## 2021-05-01 ENCOUNTER — Other Ambulatory Visit: Payer: Self-pay

## 2021-05-01 ENCOUNTER — Encounter (HOSPITAL_COMMUNITY): Payer: Medicare Other | Admitting: Cardiology

## 2021-05-01 ENCOUNTER — Ambulatory Visit (HOSPITAL_COMMUNITY)
Admission: RE | Admit: 2021-05-01 | Discharge: 2021-05-01 | Disposition: A | Discharge status: Home / Self Care | Payer: Medicare Other | Source: Ambulatory Visit | Attending: Cardiology | Admitting: Cardiology

## 2021-05-01 DIAGNOSIS — D860 Sarcoidosis of lung: Secondary | ICD-10-CM | POA: Insufficient documentation

## 2021-05-01 DIAGNOSIS — G4730 Sleep apnea, unspecified: Secondary | ICD-10-CM | POA: Diagnosis not present

## 2021-05-01 DIAGNOSIS — J9621 Acute and chronic respiratory failure with hypoxia: Secondary | ICD-10-CM | POA: Insufficient documentation

## 2021-05-01 DIAGNOSIS — I50812 Chronic right heart failure: Secondary | ICD-10-CM | POA: Diagnosis not present

## 2021-05-01 DIAGNOSIS — I272 Pulmonary hypertension, unspecified: Secondary | ICD-10-CM | POA: Insufficient documentation

## 2021-05-01 DIAGNOSIS — E785 Hyperlipidemia, unspecified: Secondary | ICD-10-CM | POA: Insufficient documentation

## 2021-05-01 DIAGNOSIS — Z86711 Personal history of pulmonary embolism: Secondary | ICD-10-CM | POA: Insufficient documentation

## 2021-05-01 LAB — ECHOCARDIOGRAM COMPLETE
Area-P 1/2: 2.17 cm2
S' Lateral: 2.9 cm

## 2021-05-01 NOTE — Progress Notes (Signed)
  Echocardiogram 2D Echocardiogram has been performed.  Darlina Sicilian M 05/01/2021, 2:39 PM

## 2021-05-04 ENCOUNTER — Other Ambulatory Visit: Payer: Self-pay

## 2021-05-04 ENCOUNTER — Ambulatory Visit (HOSPITAL_COMMUNITY)
Admission: RE | Admit: 2021-05-04 | Discharge: 2021-05-04 | Disposition: A | Discharge status: Home / Self Care | Payer: Medicare Other | Source: Ambulatory Visit | Attending: Cardiology | Admitting: Cardiology

## 2021-05-04 ENCOUNTER — Encounter (HOSPITAL_COMMUNITY): Payer: Self-pay | Admitting: Cardiology

## 2021-05-04 VITALS — BP 100/60 | HR 85 | Wt 187.2 lb

## 2021-05-04 DIAGNOSIS — I50812 Chronic right heart failure: Secondary | ICD-10-CM | POA: Diagnosis not present

## 2021-05-04 DIAGNOSIS — D862 Sarcoidosis of lung with sarcoidosis of lymph nodes: Secondary | ICD-10-CM | POA: Insufficient documentation

## 2021-05-04 DIAGNOSIS — Z7983 Long term (current) use of bisphosphonates: Secondary | ICD-10-CM | POA: Insufficient documentation

## 2021-05-04 DIAGNOSIS — Z86711 Personal history of pulmonary embolism: Secondary | ICD-10-CM | POA: Insufficient documentation

## 2021-05-04 DIAGNOSIS — Z9989 Dependence on other enabling machines and devices: Secondary | ICD-10-CM | POA: Diagnosis not present

## 2021-05-04 DIAGNOSIS — Z87891 Personal history of nicotine dependence: Secondary | ICD-10-CM | POA: Insufficient documentation

## 2021-05-04 DIAGNOSIS — I2699 Other pulmonary embolism without acute cor pulmonale: Secondary | ICD-10-CM

## 2021-05-04 DIAGNOSIS — G4733 Obstructive sleep apnea (adult) (pediatric): Secondary | ICD-10-CM | POA: Diagnosis not present

## 2021-05-04 DIAGNOSIS — Z8616 Personal history of COVID-19: Secondary | ICD-10-CM | POA: Diagnosis not present

## 2021-05-04 DIAGNOSIS — Z7901 Long term (current) use of anticoagulants: Secondary | ICD-10-CM

## 2021-05-04 DIAGNOSIS — I272 Pulmonary hypertension, unspecified: Secondary | ICD-10-CM | POA: Diagnosis present

## 2021-05-04 DIAGNOSIS — Z79899 Other long term (current) drug therapy: Secondary | ICD-10-CM | POA: Diagnosis not present

## 2021-05-04 DIAGNOSIS — D6862 Lupus anticoagulant syndrome: Secondary | ICD-10-CM | POA: Diagnosis not present

## 2021-05-04 DIAGNOSIS — Z86718 Personal history of other venous thrombosis and embolism: Secondary | ICD-10-CM | POA: Diagnosis not present

## 2021-05-04 DIAGNOSIS — I110 Hypertensive heart disease with heart failure: Secondary | ICD-10-CM | POA: Insufficient documentation

## 2021-05-04 DIAGNOSIS — T45515A Adverse effect of anticoagulants, initial encounter: Secondary | ICD-10-CM | POA: Diagnosis not present

## 2021-05-04 LAB — BASIC METABOLIC PANEL
Anion gap: 6 (ref 5–15)
BUN: 15 mg/dL (ref 8–23)
CO2: 34 mmol/L — ABNORMAL HIGH (ref 22–32)
Calcium: 8.5 mg/dL — ABNORMAL LOW (ref 8.9–10.3)
Chloride: 94 mmol/L — ABNORMAL LOW (ref 98–111)
Creatinine, Ser: 1.17 mg/dL (ref 0.61–1.24)
GFR, Estimated: >60 mL/min (ref >60)
Glucose, Bld: 91 mg/dL (ref 70–99)
Potassium: 3.2 mmol/L — ABNORMAL LOW (ref 3.5–5.1)
Sodium: 134 mmol/L — ABNORMAL LOW (ref 135–145)

## 2021-05-04 LAB — BRAIN NATRIURETIC PEPTIDE: B Natriuretic Peptide: 26.4 pg/mL (ref 0.0–100.0)

## 2021-05-04 MED ORDER — TORSEMIDE 20 MG PO TABS: ORAL_TABLET | ORAL | 3 refills | Status: DC

## 2021-05-04 NOTE — Patient Instructions (Signed)
6 minute walk.  Labs done today. We will contact you only if your labs are abnormal.  INCREASE Torsemide to 78m (4 tablets) by mouth every morning and 450m(2 tablets) by mouth every evening.  No other medication changes were made. Please continue all current medications as prescribed.  Your physician recommends that you schedule a follow-up appointment in: 1 month with our [facility] here in our office.   If you have any questions or concerns before your next appointment please send usKorea message through myDuncansviller call our office at [phone]   TO LEAVE A MESSAGE FOR THE NURSE SELECT OPTION 2, PLEASE LEAVE A MESSAGE INCLUDING: . YOUR NAME . DATE OF BIRTH . CALL BACK NUMBER . REASON FOR CALL**this is important as we prioritize the call backs  YOU WILL RECEIVE A CALL BACK THE SAME DAY AS LONG AS YOU CALL BEFORE 4:00 PM   Do the following things EVERYDAY: 1) Weigh yourself in the morning before breakfast. Write it down and keep it in a log. 2) Take your medicines as prescribed 3) Eat low salt foods--Limit salt (sodium) to 2000 mg per day.  4) Stay as active as you can everyday 5) Limit all fluids for the day to less than 2 liters   At the [facility] and your health needs are our priority. As part of our continuing mission to provide you with exceptional heart care, we have created designated Provider Care Teams. These Care Teams include your primary Cardiologist (physician) and Advanced Practice Providers (APPs- Physician Assistants and Nurse Practitioners) who all work together to provide you with the care you need, when you need it.   You may see any of the following providers on your designated Care Team at your next follow up: . Marland Kitchenr DaGlori Bickers Dr DaLoralie Champagne AmDarrick GrinderNP . BrLyda JesterPA . LaAudry RilesPharmD   Please be sure to bring in all your medications bottles to every appointment.

## 2021-05-04 NOTE — Progress Notes (Signed)
PCP: Madalyn Rob, MD HF Cardiology: Dr. Aundra Dubin  70 y.o. with history of sarcoidosis, prior PE, RV failure was referred by Dr. Shearon Stalls for evaluation of CHF/pulmonary hypertension.  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  Patient was on prednisone for a period of time, this was stopped earlier this year.  He is on 2 L home oxygen.  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 He has had recurrent PEs/DVTs.  He has an IVC filter.  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  Most recent PE was in 2/21 when he had been off warfarin transiently.  Last echo in 5/21 showed EF 55-60% with D-shaped septum and severely dilated/severely dysfunctional RV.  No estimation of PA pressure was available.    RHC in 7/21 showed normal PCWP and moderate PAH with PVR 5 WU. V/Q showed perfusion defects but thought to be due to sarcoidosis.  CTA chest in 7/21 showed no acute or chronic PE, signs of chronic sarcoidosis were present.   Echo (5/22) with EF 55-60%, moderate focal basal septal hypertrophy, severe RV enlargement with moderately decreased systolic function.   Patient presents for followup of pulmonary hypertension/RV failure.  He is now using CPAP.  Weight down 4 lbs.  Overall, has more stamina and getting around better.  He was able to go to [place] when it came to the [facility].  Still short of breath walking about 50 feet and uses rollator when going outside the house.  Able to walk up stairs with mild dyspnea.  No chest pain.  No orthopnea/PND.  No lightheadedness. Using 2 L home oxygen chronically.   Labs (6/21): pro-BNP 43, K 4.6, creatinine 0.89 Labs (8/21): hgb 10.5, K 3.8, creatinine 1.28 Labs (10/21): K 5.4, creatinine 1.1 Labs (11/21): K 3.3, creatinine 1.14 Labs (1/22): BNP 365 Labs (2/22): K 4, creatinine 1.2 Labs (3/22): K 3.9, creatinine 1.49, BNP 21  6 minute walk (11/21): 76 m 6 minute walk (1/22): 183 m 6 minute  walk (3/22): 198 m 6 minute walk (5/22): 22 m  PMH: 1. Sarcoidosis: "Burned out."  Diagnosed in 1990 with bronchoscopy/biopsy.  Treated with prednisone, now off.  - On 2 L home oxygen chronically.  2. 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 H/o DVT and PE.  Has lupus anticoagulant.   - Most recent PE was in 2/21, he had been off warfarin transiently.  - V/Q scan in 7/21 with perfusion defects, CTA in 7/21 showed no acute or chronic thrombi, signs of sarcoidosis were present.  4. COVID-19 infection 1/21.  5. H/o compression fractures in 2/21.  - Had kyphoplasty 6. RV failure/pulmonary hypertension:  - Echo (5/21) with EF 55-60%, D-shaped interventricular septum, severely dilated RV with severe systolic dysfunction, dilated IVC.  - RHC (7/21) with mean RA 9, PA 62/21 mean 39, mean PCWP 11, CI 2.64, PVR 5 WU.  - Did not tolerate Tyvaso.  - Echo (5/22): EF 55-60%, moderate focal basal septal hypertrophy, severe RV enlargement with moderately decreased systolic function.  7. HTN 8. History of subdural hematoma remotely.   Social History   Socioeconomic History  . Marital status: Divorced    Spouse name: Not on file  . Number of children: Not on file  . Years of education: Not on file  . Highest education level: Not on file  Occupational History  . Occupation: retired Pharmacist, hospital  Tobacco Use  . Smoking status: Former Smoker    Packs/day: 1.50  Years: 23.00    Pack years: 34.50    Types: Cigarettes    Quit date: 79    Years since quitting: 27.3  . Smokeless tobacco: Never Used  Vaping Use  . Vaping Use: Never used  Substance and Sexual Activity  . Alcohol use: Never  . Drug use: Not Currently    Types: "Crack" cocaine    Comment: quit 2008  . Sexual activity: Not on file  Other Topics Concern  . Not on file  Social History Narrative  . Not on file   Social Determinants of Health   Financial Resource Strain: Not on file  Food Insecurity: Not on file   Transportation Needs: Not on file  Physical Activity: Not on file  Stress: Not on file  Social Connections: Not on file  Intimate Partner Violence: Not on file   Family History  Adopted: Yes   ROS: All systems reviewed and negative except as per HPI.   Current Outpatient Medications  Medication Sig Dispense Refill  . albuterol (VENTOLIN HFA) 108 (90 Base) MCG/ACT inhaler Inhale 2 puffs into the lungs every 6 (six) hours as needed for wheezing or shortness of breath. 18 g 5  . alendronate (FOSAMAX) 70 MG tablet Take 1 tablet (70 mg total) by mouth once a week. 4 tablet 5  . Ascorbic Acid (VITAMIN C) 500 MG CAPS Take 500 mg by mouth daily.    Marland Kitchen atropine 1 % ophthalmic solution Place 1 drop into the right eye daily at 6 (six) AM.    . brimonidine (ALPHAGAN) 0.2 % ophthalmic solution Place 1 drop into the right eye daily at 6 (six) AM.    . cholecalciferol (VITAMIN D3) 25 MCG (1000 UT) tablet Take 1,000 Units by mouth daily.    . Fluticasone-Umeclidin-Vilant (TRELEGY ELLIPTA) 200-62.5-25 MCG/INH AEPB Inhale 1 puff into the lungs daily. 60 each 5  . macitentan (OPSUMIT) 10 MG tablet Take 1 tablet (10 mg total) by mouth daily. 30 tablet   . omega-3 acid ethyl esters (LOVAZA) 1 g capsule TAKE 1 CAPSULE BY MOUTH TWICE DAILY 180 capsule 0  . potassium chloride (KLOR-CON) 20 MEQ packet Take 40 mEq by mouth daily.    . Riociguat (ADEMPAS) 2.5 MG TABS Take 2.5 mg by mouth in the morning, at noon, and at bedtime. 90 tablet 11  . Selexipag 200 & 800 MCG TBPK Start 200 mcg BID. Increase by 200 mcg BID every 1-2 weeks as tolerated to maximum dose of 1600 mcg BID.    Marland Kitchen warfarin (COUMADIN) 4 MG tablet TAKE 1  TABLET BY MOUTH ONCE DAILY, EXCEPT ON MONDAYS, TAKE ONLY 1/2 TABLET ON MONDAYS. 84 tablet 1  . zinc gluconate 50 MG tablet Take 50 mg by mouth daily.    Marland Kitchen torsemide (DEMADEX) 20 MG tablet Take 4 tablets (80 mg total) by mouth every morning AND 2 tablets (40 mg total) every evening. 540 tablet 3    No current facility-administered medications for this encounter.   BP 100/60   Pulse 85   Wt 84.9 kg (187 lb 3.2 oz)   SpO2 96% Comment: 2L of Oxygen  BMI 31.15 kg/m  General: NAD Neck: JVP 8-9 cm with HJR, no thyromegaly or thyroid nodule.  Lungs: Clear to auscultation bilaterally with normal respiratory effort. CV: Nondisplaced PMI.  Heart regular S1/S2, no S3/S4, no murmur.  1+ ankle edema.  No carotid bruit.  Normal pedal pulses.  Abdomen: Soft, nontender, no hepatosplenomegaly, no distention.  Skin: Intact without lesions  or rashes.  Neurologic: Alert and oriented x 3.  Psych: Normal affect. Extremities: No clubbing or cyanosis.  HEENT: Normal.   Assessment/Plan: 1. RV failure, pulmonary hypertension: Echo in 5/21 showed EF 55-60%, D-shaped septum with severe RV dilation/severe RV dysfunction.  [place] in 7/21 showed mildly elevated RA pressure, moderate PAH with PVR 5 WU.  V/Q scan showed perfusion defects, but CTA chest in 7/21 showed no acute or chronic PE, V/Q findings were likely due to chronic sarcoidosis changes in lungs.  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  Echo in 5/22 showed mildly improved RV function compared to 5/21. 6 minute walk today stable.  He did not tolerate Tyvaso. He is at least mildly volume overloaded on exam today.  NYHA class III symptoms chronically.  - Increase torsemide to 80 qam/40 qpm.  BMET/BNP today and BMET in 10 days.  - Continue Opsumit 10 mg daily.  - Continue Adempas 2.5 mg tid.   - Continue to uptitrate selexipag, aiming for 1600 mcg bid to start.  - Continue home oxygen and CPAP.  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  Has positive lupus anticoagulant.  - Continue warfarin, should have Lovenox bridge if stops warfarin (had PE when off warfarin transiently in 2/21).  3. Sarcoidosis: History of "burned out" pulmonary sarcoidosis.  This may be the cause of his pulmonary hypertension.  No  definitive evidence for cardiac sarcoidosis, but could arrange for cardiac MRI in the future to investigate. He is no longer on prednisone.  - Continue 2 L home oxygen.  4. HTN: BP controlled.  5. OSA: Use CPAP.   Followup 1 month with APP.    Loralie Champagne 05/04/2021

## 2021-05-04 NOTE — Progress Notes (Signed)
6 Min Walk Test Completed  Pt ambulated 182.8 meters O2 Sat ranged 90%-96%  on 3L oxygen HR ranged 82-106

## 2021-05-05 ENCOUNTER — Telehealth (HOSPITAL_COMMUNITY): Payer: Self-pay

## 2021-05-05 MED ORDER — POTASSIUM CHLORIDE 20 MEQ PO PACK: 60.0000 meq | PACK | Freq: Every day | ORAL | 11 refills | Status: DC

## 2021-05-05 NOTE — Telephone Encounter (Signed)
-----  Message from [name], MD sent at 05/04/2021  9:57 PM EDT ----- Add an addition 20 mEq KCl to his daily K regimen.

## 2021-05-05 NOTE — Telephone Encounter (Signed)
Patient advised and verbalized understanding. Med list updated to reflect changes.  Meds ordered this encounter  Medications  . potassium chloride (KLOR-CON) 20 MEQ packet    Sig: Take 60 mEq by mouth daily.    Dispense:  90 packet    Refill:  11    Please cancel all previous orders for current medication. Change in dosage or pill size.

## 2021-05-08 ENCOUNTER — Encounter: Payer: Self-pay | Admitting: Internal Medicine

## 2021-05-08 ENCOUNTER — Ambulatory Visit (INDEPENDENT_AMBULATORY_CARE_PROVIDER_SITE_OTHER): Payer: Medicare Other | Admitting: Pharmacist

## 2021-05-08 ENCOUNTER — Ambulatory Visit (INDEPENDENT_AMBULATORY_CARE_PROVIDER_SITE_OTHER): Payer: Medicare Other | Admitting: Internal Medicine

## 2021-05-08 DIAGNOSIS — I2699 Other pulmonary embolism without acute cor pulmonale: Secondary | ICD-10-CM | POA: Diagnosis not present

## 2021-05-08 DIAGNOSIS — Z86718 Personal history of other venous thrombosis and embolism: Secondary | ICD-10-CM | POA: Diagnosis not present

## 2021-05-08 DIAGNOSIS — I50813 Acute on chronic right heart failure: Secondary | ICD-10-CM

## 2021-05-08 DIAGNOSIS — M8080XA Other osteoporosis with current pathological fracture, unspecified site, initial encounter for fracture: Secondary | ICD-10-CM | POA: Diagnosis not present

## 2021-05-08 DIAGNOSIS — Z86711 Personal history of pulmonary embolism: Secondary | ICD-10-CM | POA: Diagnosis not present

## 2021-05-08 DIAGNOSIS — T45515A Adverse effect of anticoagulants, initial encounter: Secondary | ICD-10-CM

## 2021-05-08 DIAGNOSIS — G4733 Obstructive sleep apnea (adult) (pediatric): Secondary | ICD-10-CM | POA: Diagnosis not present

## 2021-05-08 DIAGNOSIS — Z7901 Long term (current) use of anticoagulants: Secondary | ICD-10-CM | POA: Diagnosis not present

## 2021-05-08 LAB — POCT INR: INR: 2.1 (ref 2.0–3.0)

## 2021-05-08 MED ORDER — ALENDRONATE SODIUM 70 MG PO TABS: 70.0000 mg | ORAL_TABLET | ORAL | 5 refills | Status: DC

## 2021-05-08 NOTE — Patient Instructions (Addendum)
It was a pleasure to see you today!   We have refilled your alendronate.  For your COVID booster, you can get it at any pharmacy but we have put your name on our list and someone should call you from our clinic with more information.

## 2021-05-08 NOTE — Assessment & Plan Note (Addendum)
Echo from 05/01/21 notable for moderately reduced RV systolic function and severely enlarged right ventricular size. Recently increased torsemide to 36m in the morning and 40 mg in the evening. BMP from 5/12 notable for hypokalemia and started increased potassium supplementation ( 60 mg daily) on 5/14.  A&P: Chronic compensated right heart failure - Continue torsemide 80 mg in the morning and 40 mg in the evening - Continue potassium 60 mg daily - Recheck BMP at next visit

## 2021-05-08 NOTE — Progress Notes (Signed)
Attestation for Student Documentation:  I personally was present and performed or re-performed the history, physical exam and medical decision-making activities of this service and have verified that the service and findings are accurately documented in the student's note.  Madalyn Rob, MD 05/08/2021, 4:17 PM

## 2021-05-08 NOTE — Assessment & Plan Note (Addendum)
Has been wearing CPAP and is on 2 L oxygen at home.   A&P: Well controlled with CPAP use - Continue use of CPAP

## 2021-05-08 NOTE — Assessment & Plan Note (Addendum)
Patient has been taking alendronate weekly for the past few months and is tolerating it well.   A&P: Osteoporosis in patient with history of prednisone use and compression fractures, at high risk of future fractures.  - Alendronate started 04/10/20 - give high fracture risk recommend patient will benefit for up to 10 years of use.

## 2021-05-08 NOTE — Progress Notes (Addendum)
Subjective:   Patient ID: Jesus Russell male   DOB: 02-Jan-1951 70 y.o.   MRN: [id number]  HPI: [id number] Jesus Russell is a 70 y.o. male with a past medical history of sarcoidosis, OSA, pulmonary hypertension, right heart failure, and history of DVTs who presents today for a routine follow up. He has been able to perform his activities of daily living and has plenty of help at home. He has not had any falls.   Please see problem based charting for more details.   Past Medical History:  Diagnosis Date  . Acute on chronic respiratory failure with hypoxia ([place])   . COVID-19 virus infection   . DVT of lower extremity, bilateral ([place])   . Epistaxis   . Hypertension   . Incarcerated umbilical hernia [id number]   Status post repair  . Lupus anticoagulant positive    Per records from [facility]  . 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 Pulmonary artery hypertension ([place]) 04/2011   Mildly elevated pulmonary artery pressure in setting of PE (from [facility])  . Pulmonary embolism associated with COVID-19 ([place])   . Subdural hematoma (HCC)    Remote episode, occurred when taking excedrin and warfarin.    Current Outpatient Medications  Medication Sig Dispense Refill  . albuterol (VENTOLIN HFA) 108 (90 Base) MCG/ACT inhaler Inhale 2 puffs into the lungs every 6 (six) hours as needed for wheezing or shortness of breath. 18 g 5  . alendronate (FOSAMAX) 70 MG tablet Take 1 tablet (70 mg total) by mouth once a week. 4 tablet 5  . Ascorbic Acid (VITAMIN C) 500 MG CAPS Take 500 mg by mouth daily.    Marland Kitchen atropine 1 % ophthalmic solution Place 1 drop into the right eye daily at 6 (six) AM.    . cholecalciferol (VITAMIN D3) 25 MCG (1000 UT) tablet Take 1,000 Units by mouth daily.    . Fluticasone-Umeclidin-Vilant (TRELEGY ELLIPTA) 200-62.5-25 MCG/INH AEPB Inhale 1 puff into the lungs daily. 60 each 5  . macitentan (OPSUMIT) 10 MG tablet  Take 1 tablet (10 mg total) by mouth daily. 30 tablet   . omega-3 acid ethyl esters (LOVAZA) 1 g capsule TAKE 1 CAPSULE BY MOUTH TWICE DAILY 180 capsule 0  . potassium chloride (KLOR-CON) 20 MEQ packet Take 60 mEq by mouth daily. 90 packet 11  . Riociguat (ADEMPAS) 2.5 MG TABS Take 2.5 mg by mouth in the morning, at noon, and at bedtime. 90 tablet 11  . Selexipag 200 & 800 MCG TBPK Start 200 mcg BID. Increase by 200 mcg BID every 1-2 weeks as tolerated to maximum dose of 1600 mcg BID.    Marland Kitchen torsemide (DEMADEX) 20 MG tablet Take 4 tablets (80 mg total) by mouth every morning AND 2 tablets (40 mg total) every evening. 540 tablet 3  . warfarin (COUMADIN) 4 MG tablet TAKE 1  TABLET BY MOUTH ONCE DAILY, EXCEPT ON MONDAYS, TAKE ONLY 1/2 TABLET ON MONDAYS. 84 tablet 1  . zinc gluconate 50 MG tablet Take 50 mg by mouth daily.     No current facility-administered medications for this visit.   Family History  Adopted: Yes   Social History   Socioeconomic History  . Marital status: Divorced    Spouse name: Not on file  . Number of children: Not on file  . Years of education: Not on file  . Highest education level: Not  on file  Occupational History  . Occupation: retired Pharmacist, hospital  Tobacco Use  . Smoking status: Former Smoker    Packs/day: 1.50    Years: 23.00    Pack years: 34.50    Types: Cigarettes    Quit date: 1995    Years since quitting: 27.3  . Smokeless tobacco: Never Used  Vaping Use  . Vaping Use: Never used  Substance and Sexual Activity  . Alcohol use: Never  . Drug use: Not Currently    Types: "Crack" cocaine    Comment: quit 2008  . Sexual activity: Not on file  Other Topics Concern  . Not on file  Social History Narrative  . Not on file   Social Determinants of Health   Financial Resource Strain: Not on file  Food Insecurity: Not on file  Transportation Needs: Not on file  Physical Activity: Not on file  Stress: Not on file  Social Connections: Not on file    Review of Systems: Pertinent items noted in HPI and remainder of comprehensive ROS otherwise negative. Objective:  Physical Exam: Vitals:   05/08/21 1345  BP: (!) 90/52  Pulse: 88  Temp: 98.1 F (36.7 C)  TempSrc: Oral  SpO2: 95%  Weight: 183 lb 1.6 oz (83.1 kg)    General: Well appearing and in no acute distress, appears stated age Neuro: A&O x4, normal affect Cardiovascular: RRR, no m/r/g. Normal S1 and S2 without S3 or S4. Pulses 2+ in bilateral UE and LE. 1+ pitting edema Pulmonary: CTAB, no wheezes, rhonchi or rales. Normal WOB, no clubbing  Abdominal: Abdomen soft and distended. Hypoactive bowel sounds in all four quadrants. No tenderness to palpation, guarding, or rebound tenderness  Skin: Warm and dry with no rashes, cuts, or bruises MSK: Normal ROM of all extremities. Strength 5/5 in bilateral UE and LE  Assessment & Plan:  Please see problem based charting for more details.

## 2021-05-09 NOTE — Progress Notes (Signed)
Internal Medicine Clinic Attending  Case discussed with Dr. Steen  At the time of the visit.  We reviewed the resident's history and exam and pertinent patient test results.  I agree with the assessment, diagnosis, and plan of care documented in the resident's note.  

## 2021-05-11 ENCOUNTER — Other Ambulatory Visit: Payer: Self-pay | Admitting: Pharmacist

## 2021-05-11 MED ORDER — WARFARIN SODIUM 4 MG PO TABS: ORAL_TABLET | ORAL | 2 refills | Status: DC

## 2021-05-11 NOTE — Patient Instructions (Signed)
Patient instructed to take medications as defined in the Anti-coagulation Track section of this encounter.  Patient instructed to  take today's dose.  Patient instructed to take 1 and 1/2 of your warfarin tablets all days of the week--EXCEPT on Fridays and Saturdays, take only 1 tablet.  Patient verbalized understanding of these instructions.

## 2021-05-11 NOTE — Progress Notes (Signed)
Anticoagulation Management Jesus Russell is a 69 y.o. male who reports to the clinic for monitoring of warfarin treatment.    Indication: DVT, history of; PE, history of; long term current use of warfarin.  Duration: indefinite Supervising physician: Joni Reining  Anticoagulation Clinic Visit History: Patient does not report signs/symptoms of bleeding or thromboembolism  Other recent changes: No diet, medications, lifestyle changes cited by the patient at this visit.  Anticoagulation Episode Summary    Current INR goal:  2.0-3.0  TTR:  68.0 % (1.5 y)  Next INR check:  06/05/2021  INR from last check:  2.1 (05/08/2021)  Weekly max warfarin dose:    Target end date:  Indefinite  INR check location:    Preferred lab:    Send INR reminders to:     Indications   Long term (current) use of anticoagulants [Z79.01] History of DVT (deep vein thrombosis) [Z86.718] Pulmonary embolism on long-term anticoagulation therapy (HCC) [I26.99 T45.515A] History of pulmonary embolism [Z86.711]       Comments:          No Known Allergies  Current Outpatient Medications:  .  albuterol (VENTOLIN HFA) 108 (90 Base) MCG/ACT inhaler, Inhale 2 puffs into the lungs every 6 (six) hours as needed for wheezing or shortness of breath., Disp: 18 g, Rfl: 5 .  alendronate (FOSAMAX) 70 MG tablet, Take 1 tablet (70 mg total) by mouth once a week., Disp: 4 tablet, Rfl: 5 .  Ascorbic Acid (VITAMIN C) 500 MG CAPS, Take 500 mg by mouth daily., Disp: , Rfl:  .  atropine 1 % ophthalmic solution, Place 1 drop into the right eye daily at 6 (six) AM., Disp: , Rfl:  .  cholecalciferol (VITAMIN D3) 25 MCG (1000 UT) tablet, Take 1,000 Units by mouth daily., Disp: , Rfl:  .  Fluticasone-Umeclidin-Vilant (TRELEGY ELLIPTA) 200-62.5-25 MCG/INH AEPB, Inhale 1 puff into the lungs daily., Disp: 60 each, Rfl: 5 .  macitentan (OPSUMIT) 10 MG tablet, Take 1 tablet (10 mg total) by mouth daily., Disp: 30 tablet, Rfl:  .  omega-3 acid  ethyl esters (LOVAZA) 1 g capsule, TAKE 1 CAPSULE BY MOUTH TWICE DAILY, Disp: 180 capsule, Rfl: 0 .  potassium chloride (KLOR-CON) 20 MEQ packet, Take 60 mEq by mouth daily., Disp: 90 packet, Rfl: 11 .  Riociguat (ADEMPAS) 2.5 MG TABS, Take 2.5 mg by mouth in the morning, at noon, and at bedtime., Disp: 90 tablet, Rfl: 11 .  Selexipag 200 & 800 MCG TBPK, Start 200 mcg BID. Increase by 200 mcg BID every 1-2 weeks as tolerated to maximum dose of 1600 mcg BID., Disp: , Rfl:  .  torsemide (DEMADEX) 20 MG tablet, Take 4 tablets (80 mg total) by mouth every morning AND 2 tablets (40 mg total) every evening., Disp: 540 tablet, Rfl: 3 .  zinc gluconate 50 MG tablet, Take 50 mg by mouth daily., Disp: , Rfl:  .  warfarin (COUMADIN) 4 MG tablet, TAKE 1  TABLET BY MOUTH ONCE DAILY ON FRIDAYS AND SATURDAYS.  TAKE 1 AND 1/2 TABLETS ON ALL OTHER DAYS., Disp: 40 tablet, Rfl: 2 Past Medical History:  Diagnosis Date  . Acute on chronic respiratory failure with hypoxia ([place])   . COVID-19 virus infection   . DVT of lower extremity, bilateral ([place])   . Epistaxis   . Hypertension   . Incarcerated umbilical hernia [id number]   Status post repair  . Lupus anticoagulant positive    Per records from [facility]  .  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 Pulmonary artery hypertension ([place]) 04/2011   Mildly elevated pulmonary artery pressure in setting of PE (from [facility])  . Pulmonary embolism associated with COVID-19 ([place])   . Subdural hematoma (HCC)    Remote episode, occurred when taking excedrin and warfarin.    Social History   Socioeconomic History  . Marital status: Divorced    Spouse name: Not on file  . Number of children: Not on file  . Years of education: Not on file  . Highest education level: Not on file  Occupational History  . Occupation: retired Pharmacist, hospital  Tobacco Use  . Smoking status: Former Smoker    Packs/day: 1.50     Years: 23.00    Pack years: 34.50    Types: Cigarettes    Quit date: 1995    Years since quitting: 27.3  . Smokeless tobacco: Never Used  Vaping Use  . Vaping Use: Never used  Substance and Sexual Activity  . Alcohol use: Never  . Drug use: Not Currently    Types: "Crack" cocaine    Comment: quit 2008  . Sexual activity: Not on file  Other Topics Concern  . Not on file  Social History Narrative  . Not on file   Social Determinants of Health   Financial Resource Strain: Not on file  Food Insecurity: Not on file  Transportation Needs: Not on file  Physical Activity: Not on file  Stress: Not on file  Social Connections: Not on file   Family History  Adopted: Yes    ASSESSMENT Recent Results: The most recent result is correlated with 34 mg per week: Lab Results  Component Value Date   INR 2.1 05/08/2021   INR 2.7 04/10/2021   INR 3.0 03/14/2021    Anticoagulation Dosing: Description   Take one-and-one-half (1 & 1/2) of your 23m blue colored warfarin tablets all days of the week--EXCEPT on FRIDAYS,  and SATURDAYS, take only one (1) tablet on Fridays and Saturdays.      INR today: Therapeutic  PLAN Weekly dose was increased by 12% to 38 mg per week  Patient Instructions  Patient instructed to take medications as defined in the Anti-coagulation Track section of this encounter.  Patient instructed to  take today's dose.  Patient instructed to take 1 and 1/2 of your warfarin tablets all days of the week--EXCEPT on Fridays and Saturdays, take only 1 tablet.  Patient verbalized understanding of these instructions.    Patient advised to contact clinic or seek medical attention if signs/symptoms of bleeding or thromboembolism occur.  Patient verbalized understanding by repeating back information and was advised to contact me if further medication-related questions arise. Patient was also provided an information handout.  Follow-up Return in 4 weeks (on 06/05/2021)  for Follow up INR.  JPennie Banter PharmD, CPP  15 minutes spent face-to-face with the patient during the encounter. 50% of time spent on education, including signs/sx bleeding and clotting, as well as food and drug interactions with warfarin. 50% of time was spent on fingerprick POC INR sample collection,processing, results determination, and documentation in [email or web address]

## 2021-05-12 ENCOUNTER — Other Ambulatory Visit: Payer: Self-pay | Admitting: Pharmacist

## 2021-05-12 NOTE — Telephone Encounter (Signed)
Refill authorization now amended for 90 day supply dispensing.

## 2021-05-30 ENCOUNTER — Encounter (INDEPENDENT_AMBULATORY_CARE_PROVIDER_SITE_OTHER): Payer: Self-pay | Admitting: Ophthalmology

## 2021-05-30 ENCOUNTER — Other Ambulatory Visit: Payer: Self-pay

## 2021-05-30 ENCOUNTER — Ambulatory Visit (INDEPENDENT_AMBULATORY_CARE_PROVIDER_SITE_OTHER): Payer: Medicare Other | Admitting: Ophthalmology

## 2021-05-30 DIAGNOSIS — H3563 Retinal hemorrhage, bilateral: Secondary | ICD-10-CM | POA: Diagnosis not present

## 2021-05-30 DIAGNOSIS — H35033 Hypertensive retinopathy, bilateral: Secondary | ICD-10-CM | POA: Diagnosis not present

## 2021-05-30 DIAGNOSIS — H3581 Retinal edema: Secondary | ICD-10-CM

## 2021-05-30 DIAGNOSIS — I10 Essential (primary) hypertension: Secondary | ICD-10-CM

## 2021-05-30 DIAGNOSIS — Z961 Presence of intraocular lens: Secondary | ICD-10-CM

## 2021-05-30 NOTE — Progress Notes (Signed)
[facility] Note  05/30/2021     CHIEF COMPLAINT Patient presents for Retina Follow Up   HISTORY OF PRESENT ILLNESS: Jesus Russell is a 70 y.o. male who presents to the clinic today for:  HPI    Retina Follow Up    Patient presents with  Other.  In both eyes.  This started 6 weeks ago.  I, the attending physician,  performed the HPI with the patient and updated documentation appropriately.          Comments    Patient here for 6 weeks retina follow up for retinal hemorrhage OU. Patient states vision doing good. No eye pain.       Last edited by Bernarda Caffey, MD on 05/30/2021 10:33 PM. (History)    Pt had cataract sx OD and states the sx went well   Referring physician: Monna Fam, MD [place],  [place] [address]  HISTORICAL INFORMATION:   Selected notes from the MEDICAL RECORD NUMBER Referred by Dr. [name] for ret eval   CURRENT MEDICATIONS: Current Outpatient Medications (Ophthalmic Drugs)  Medication Sig  . atropine 1 % ophthalmic solution Place 1 drop into the right eye daily at 6 (six) AM.   No current facility-administered medications for this visit. (Ophthalmic Drugs)   Current Outpatient Medications (Other)  Medication Sig  . albuterol (VENTOLIN HFA) 108 (90 Base) MCG/ACT inhaler Inhale 2 puffs into the lungs every 6 (six) hours as needed for wheezing or shortness of breath.  Marland Kitchen alendronate (FOSAMAX) 70 MG tablet Take 1 tablet (70 mg total) by mouth once a week.  . Ascorbic Acid (VITAMIN C) 500 MG CAPS Take 500 mg by mouth daily.  . cholecalciferol (VITAMIN D3) 25 MCG (1000 UT) tablet Take 1,000 Units by mouth daily.  . Fluticasone-Umeclidin-Vilant (TRELEGY ELLIPTA) 200-62.5-25 MCG/INH AEPB Inhale 1 puff into the lungs daily.  . macitentan (OPSUMIT) 10 MG tablet Take 1 tablet (10 mg total) by mouth daily.  Marland Kitchen omega-3 acid ethyl esters (LOVAZA) 1 g capsule TAKE 1 CAPSULE BY MOUTH TWICE DAILY  . potassium  chloride (KLOR-CON) 20 MEQ packet Take 60 mEq by mouth daily.  . Riociguat (ADEMPAS) 2.5 MG TABS Take 2.5 mg by mouth in the morning, at noon, and at bedtime.  . Selexipag 200 & 800 MCG TBPK Start 200 mcg BID. Increase by 200 mcg BID every 1-2 weeks as tolerated to maximum dose of 1600 mcg BID.  Marland Kitchen torsemide (DEMADEX) 20 MG tablet Take 4 tablets (80 mg total) by mouth every morning AND 2 tablets (40 mg total) every evening.  . warfarin (COUMADIN) 4 MG tablet TAKE 1 TABLET BY MOUTH ONCE DAILY ON FRIDAY AND SATURDAY, AND TAKE 1 AND 1/2 TABLETS ON ALL OTHER DAYS  . zinc gluconate 50 MG tablet Take 50 mg by mouth daily.   No current facility-administered medications for this visit. (Other)      REVIEW OF SYSTEMS: ROS    Positive for: Musculoskeletal, Cardiovascular, Eyes, Respiratory   Negative for: Constitutional, Gastrointestinal, Neurological, Skin, Genitourinary, HENT, Endocrine, Psychiatric, Allergic/Imm, Heme/Lymph   Last edited by Theodore Demark, COA on 05/30/2021  1:05 PM. (History)       ALLERGIES No Known Allergies  PAST MEDICAL HISTORY Past Medical History:  Diagnosis Date  . Acute on chronic respiratory failure with hypoxia ([place])   . COVID-19 virus infection   . DVT of lower extremity, bilateral ([place])   . Epistaxis   . Hypertension   .  Incarcerated umbilical hernia [id number]/8250   Status post repair  . Lupus anticoagulant positive    Per records from [facility]  . 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 Pulmonary artery hypertension ([place]) 04/2011   Mildly elevated pulmonary artery pressure in setting of PE (from [facility])  . Pulmonary embolism associated with COVID-19 ([place])   . Subdural hematoma ([facility])    Remote episode, occurred when taking excedrin and warfarin.    Past Surgical History:  Procedure Laterality Date  . BRAIN SURGERY    . HERNIA REPAIR    . IR KYPHO EA ADDL LEVEL THORACIC OR LUMBAR   05/04/2020  . IR KYPHO EA ADDL LEVEL THORACIC OR LUMBAR  05/04/2020  . IR KYPHO THORACIC WITH BONE BIOPSY  05/04/2020  . IR RADIOLOGIST EVAL & MGMT  04/07/2020  . IR RADIOLOGIST EVAL & MGMT  06/08/2020  . NASAL ENDOSCOPY WITH EPISTAXIS CONTROL N/A 02/24/2020   Procedure: NASAL ENDOSCOPY WITH EPISTAXIS CONTROL;  Surgeon: [name], MD;  Location: [facility];  Service: ENT;  Laterality: N/A;  . RIGHT HEART CATH N/A 07/06/2020   Procedure: RIGHT HEART CATH;  Surgeon: [name], MD;  Location: [facility];  Service: Cardiovascular;  Laterality: N/A;    FAMILY HISTORY Family History  Adopted: Yes    SOCIAL HISTORY Social History   Tobacco Use  . Smoking status: Former Smoker    Packs/day: 1.50    Years: 23.00    Pack years: 34.50    Types: Cigarettes    Quit date: 1995    Years since quitting: 27.4  . Smokeless tobacco: Never Used  Vaping Use  . Vaping Use: Never used  Substance Use Topics  . Alcohol use: Never  . Drug use: Not Currently    Types: "Crack" cocaine    Comment: quit 2008         OPHTHALMIC EXAM:  Base Eye Exam    Visual Acuity (Snellen - Linear)      Right Left   Dist cc 20/25 20/40   Dist ph cc NI 20/30   Correction: Glasses       Tonometry (Tonopen, 1:03 PM)      Right Left   Pressure 16 19       Pupils      Dark Light Shape React APD   Right 3 2 Round Brisk None   Left 3 2 Round Brisk None       Visual Fields (Counting fingers)      Left Right    Full Full       Extraocular Movement      Right Left    Full Full       Neuro/Psych    Oriented x3: Yes   Mood/Affect: Normal       Dilation    Both eyes: 1.0% Mydriacyl, 2.5% Phenylephrine @ 1:03 PM        Slit Lamp and Fundus Exam    Slit Lamp Exam      Right Left   Lids/Lashes Dermatochalasis - upper lid Dermatochalasis - upper lid, mild Ptosis   Conjunctiva/Sclera mild melanosis mild melanosis   Cornea arcus, trace PEE, well healed cataract wound arcus, trace PEE, well  healed cataract wounds   Anterior Chamber Deep and quiet Deep and quiet   Iris Round and dilated Round and dilated   Lens PC IOL in good position PC  IOL in good position   Vitreous Vitreous syneresis, Posterior vitreous detachment, vitreous condensations Vitreous syneresis, Posterior vitreous detachment, Weiss ring       Fundus Exam      Right Left   Disc Pink and Sharp, Compact, disc heme at 0730, CWS inferior to disc -- both resolved mild Pallor, Sharp rim, focal temporal PPP   C/D Ratio 0.2 0.2   Macula Flat, good foveal reflex, CWS/IRH IN mac -- slightly improved Flat, Good foveal reflex, mild RPE mottling and clumping, No heme or edema   Vessels attenuated, Copper wiring, AV crossing changes attenuated, mild tortuousity   Periphery Attached, focal SRH and CWS superior and nasal to disc -- improving Attached, focal blot heme nasal to disc -- improving, turning white          IMAGING AND PROCEDURES  Imaging and Procedures for 05/30/2021  OCT, Retina - OU - Both Eyes       Right Eye Quality was good. Central Foveal Thickness: 237. Progression has been stable. Findings include normal foveal contour, no IRF, no SRF.   Left Eye Quality was good. Central Foveal Thickness: 239. Progression has no prior data. Findings include normal foveal contour, no IRF, no SRF.   Notes *Images captured and stored on drive  Diagnosis / Impression:  NFP; no IRF/SRF OU  Clinical management:  See below  Abbreviations: NFP - Normal foveal profile. CME - cystoid macular edema. PED - pigment epithelial detachment. IRF - intraretinal fluid. SRF - subretinal fluid. EZ - ellipsoid zone. ERM - epiretinal membrane. ORA - outer retinal atrophy. ORT - outer retinal tubulation. SRHM - subretinal hyper-reflective material. IRHM - intraretinal hyper-reflective material                 ASSESSMENT/PLAN:    ICD-10-CM   1. Retinal hemorrhage, bilateral  H35.63   2. Retinal edema  H35.81 OCT, Retina -  OU - Both Eyes  3. Essential hypertension  I10   4. Hypertensive retinopathy of both eyes  H35.033   5. Pseudophakia, both eyes  Z96.1     1,2. Peripapillary retinal hemorrhage OU (OD>OS) -- improving  - OD with peripapillary blot hemes and CWS  - OS w/ mild focal peripapillary blot heme nasal to disc   - no macular edema and hemes improving  - FA 4.26.22 shows mild, late peripapillary leakage inferior to disc OD, OS normal study  - broad differential but appearance suggestive of hypertensive retinopathy  - pt is also on coumadin for h/o DVT and PE -- likely contributing to hemorrhages  - discussed findings, prognosis  - no treatment indicated or recommended at this time  - recommend continued monitoring  - f/u 3 months, sooner prn -- DFE/OCT  3,4. Hypertensive retinopathy OU  - BP well controlled -- 103/66 in office, 04.26.22 - discussed importance of tight BP control and possible contribution to retinal hemes - monitor  5. Pseudophakia OU  - s/p CE/IOL OS (Dr. [name])  - IOLs in good position, doing well  - monitor  Ophthalmic Meds Ordered this visit:  No orders of the defined types were placed in this encounter.      Return in about 3 months (around 08/30/2021) for f/u retinal hemorrhages OU, DFE, OCT.  There are no Patient Instructions on file for this visit.   Explained the diagnoses, plan, and follow up with the patient and they expressed understanding.  Patient expressed understanding of the importance of proper follow up care.   This  document serves as a record of services personally performed by Gardiner Sleeper, MD, PhD. It was created on their behalf by Estill Bakes, COT an ophthalmic technician. The creation of this record is the provider's dictation and/or activities during the visit.    Electronically signed by: Estill Bakes, COT 6.7.22 @ 10:36 PM   This document serves as a record of services personally performed by Gardiner Sleeper, MD, PhD. It was created  on their behalf by San Jetty. Owens Shark, OA an ophthalmic technician. The creation of this record is the provider's dictation and/or activities during the visit.    Electronically signed by: San Jetty. Marguerita Merles 06.07.2022 10:36 PM   Gardiner Sleeper, M.D., Ph.D. Diseases & Surgery of the Retina and Vitreous [facility]  I have reviewed the above documentation for accuracy and completeness, and I agree with the above. Gardiner Sleeper, M.D., Ph.D. 05/30/21 10:36 PM  Abbreviations: M myopia (nearsighted); A astigmatism; H hyperopia (farsighted); P presbyopia; Mrx spectacle prescription;  CTL contact lenses; OD right eye; OS left eye; OU both eyes  XT exotropia; ET esotropia; PEK punctate epithelial keratitis; PEE punctate epithelial erosions; DES dry eye syndrome; MGD meibomian gland dysfunction; ATs artificial tears; PFAT's preservative free artificial tears; [place] nuclear sclerotic cataract; PSC posterior subcapsular cataract; ERM epi-retinal membrane; PVD posterior vitreous detachment; RD retinal detachment; DM diabetes mellitus; DR diabetic retinopathy; NPDR non-proliferative diabetic retinopathy; PDR proliferative diabetic retinopathy; CSME clinically significant macular edema; DME diabetic macular edema; dbh dot blot hemorrhages; CWS cotton wool spot; POAG primary open angle glaucoma; C/D cup-to-disc ratio; HVF humphrey visual field; GVF goldmann visual field; OCT optical coherence tomography; IOP intraocular pressure; BRVO Branch retinal vein occlusion; CRVO central retinal vein occlusion; CRAO central retinal artery occlusion; BRAO branch retinal artery occlusion; RT retinal tear; SB scleral buckle; PPV pars plana vitrectomy; VH Vitreous hemorrhage; PRP panretinal laser photocoagulation; IVK intravitreal kenalog; VMT vitreomacular traction; MH Macular hole;  NVD neovascularization of the disc; NVE neovascularization elsewhere; AREDS age related eye disease study; ARMD age related  macular degeneration; POAG primary open angle glaucoma; EBMD epithelial/anterior basement membrane dystrophy; ACIOL anterior chamber intraocular lens; IOL intraocular lens; PCIOL posterior chamber intraocular lens; Phaco/IOL phacoemulsification with intraocular lens placement; [place] photorefractive keratectomy; LASIK laser assisted in situ keratomileusis; HTN hypertension; DM diabetes mellitus; COPD chronic obstructive pulmonary disease

## 2021-05-31 ENCOUNTER — Encounter (HOSPITAL_COMMUNITY): Payer: Self-pay

## 2021-06-05 ENCOUNTER — Ambulatory Visit: Payer: Medicare Other

## 2021-06-07 ENCOUNTER — Encounter (HOSPITAL_COMMUNITY): Payer: Self-pay

## 2021-06-07 ENCOUNTER — Ambulatory Visit (INDEPENDENT_AMBULATORY_CARE_PROVIDER_SITE_OTHER): Payer: Medicare Other | Admitting: Pharmacist

## 2021-06-07 ENCOUNTER — Other Ambulatory Visit: Payer: Self-pay

## 2021-06-07 ENCOUNTER — Ambulatory Visit (HOSPITAL_COMMUNITY)
Admission: RE | Admit: 2021-06-07 | Discharge: 2021-06-07 | Disposition: A | Discharge status: Home / Self Care | Payer: Medicare Other | Source: Ambulatory Visit | Attending: Family Medicine | Admitting: Family Medicine

## 2021-06-07 VITALS — BP 102/60 | HR 72 | Wt 188.6 lb

## 2021-06-07 DIAGNOSIS — I2782 Chronic pulmonary embolism: Secondary | ICD-10-CM | POA: Diagnosis not present

## 2021-06-07 DIAGNOSIS — Z86718 Personal history of other venous thrombosis and embolism: Secondary | ICD-10-CM | POA: Diagnosis not present

## 2021-06-07 DIAGNOSIS — D860 Sarcoidosis of lung: Secondary | ICD-10-CM | POA: Diagnosis not present

## 2021-06-07 DIAGNOSIS — Z7901 Long term (current) use of anticoagulants: Secondary | ICD-10-CM | POA: Diagnosis present

## 2021-06-07 DIAGNOSIS — I10 Essential (primary) hypertension: Secondary | ICD-10-CM | POA: Diagnosis not present

## 2021-06-07 DIAGNOSIS — T45515A Adverse effect of anticoagulants, initial encounter: Secondary | ICD-10-CM

## 2021-06-07 DIAGNOSIS — G4733 Obstructive sleep apnea (adult) (pediatric): Secondary | ICD-10-CM | POA: Diagnosis not present

## 2021-06-07 DIAGNOSIS — I272 Pulmonary hypertension, unspecified: Secondary | ICD-10-CM | POA: Diagnosis present

## 2021-06-07 DIAGNOSIS — I509 Heart failure, unspecified: Secondary | ICD-10-CM | POA: Diagnosis not present

## 2021-06-07 DIAGNOSIS — D869 Sarcoidosis, unspecified: Secondary | ICD-10-CM | POA: Diagnosis not present

## 2021-06-07 DIAGNOSIS — I2699 Other pulmonary embolism without acute cor pulmonale: Secondary | ICD-10-CM | POA: Diagnosis not present

## 2021-06-07 DIAGNOSIS — Z87891 Personal history of nicotine dependence: Secondary | ICD-10-CM | POA: Diagnosis not present

## 2021-06-07 DIAGNOSIS — Z9989 Dependence on other enabling machines and devices: Secondary | ICD-10-CM | POA: Diagnosis not present

## 2021-06-07 DIAGNOSIS — I82509 Chronic embolism and thrombosis of unspecified deep veins of unspecified lower extremity: Secondary | ICD-10-CM | POA: Diagnosis not present

## 2021-06-07 DIAGNOSIS — Z86711 Personal history of pulmonary embolism: Secondary | ICD-10-CM

## 2021-06-07 DIAGNOSIS — Z8616 Personal history of COVID-19: Secondary | ICD-10-CM | POA: Diagnosis not present

## 2021-06-07 DIAGNOSIS — D6862 Lupus anticoagulant syndrome: Secondary | ICD-10-CM | POA: Insufficient documentation

## 2021-06-07 DIAGNOSIS — I5089 Other heart failure: Secondary | ICD-10-CM | POA: Diagnosis not present

## 2021-06-07 LAB — BASIC METABOLIC PANEL
Anion gap: 4 — ABNORMAL LOW (ref 5–15)
BUN: 14 mg/dL (ref 8–23)
CO2: 33 mmol/L — ABNORMAL HIGH (ref 22–32)
Calcium: 8.5 mg/dL — ABNORMAL LOW (ref 8.9–10.3)
Chloride: 98 mmol/L (ref 98–111)
Creatinine, Ser: 1.33 mg/dL — ABNORMAL HIGH (ref 0.61–1.24)
GFR, Estimated: 59 mL/min — ABNORMAL LOW (ref >60)
Glucose, Bld: 85 mg/dL (ref 70–99)
Potassium: 5 mmol/L (ref 3.5–5.1)
Sodium: 135 mmol/L (ref 135–145)

## 2021-06-07 LAB — POCT INR: INR: 3.5 — AB (ref 2.0–3.0)

## 2021-06-07 LAB — BRAIN NATRIURETIC PEPTIDE: B Natriuretic Peptide: 22.6 pg/mL (ref 0.0–100.0)

## 2021-06-07 NOTE — Progress Notes (Signed)
INTERNAL MEDICINE TEACHING ATTENDING ADDENDUM ° °I agree with pharmacy recommendations as outlined in their note.  ° °-Alanzo Lamb MD ° °

## 2021-06-07 NOTE — Patient Instructions (Signed)
It was great to see you today! No medication changes are needed at this time.   Labs today We will only contact you if something comes back abnormal or we need to make some changes. Otherwise no news is good news!  Your physician recommends that you schedule a follow-up appointment in: 2-3 [place]  Do the following things EVERYDAY: Weigh yourself in the morning before breakfast. Write it down and keep it in a log. Take your medicines as prescribed Eat low salt foods--Limit salt (sodium) to 2000 mg per day.  Stay as active as you can everyday Limit all fluids for the day to less than 2 liters  At the [facility], you and your health needs are our priority. As part of our continuing mission to provide you with exceptional heart care, we have created designated Provider Care Teams. These Care Teams include your primary Cardiologist (physician) and Advanced Practice Providers (APPs- Physician Assistants and Nurse Practitioners) who all work together to provide you with the care you need, when you need it.   You may see any of the following providers on your designated Care Team at your next follow up: Dr Glori Bickers Dr Loralie Champagne Dr Patrice Paradise, NP Lyda Jester, [place] Ginnie Smart Audry Riles, PharmD   Please be sure to bring in all your medications bottles to every appointment.

## 2021-06-07 NOTE — Patient Instructions (Signed)
Patient instructed to take medications as defined in the Anti-coagulation Track section of this encounter.  Patient instructed to take today's dose.  Patient instructed to take one-and-one-half (1 & 1/2) of your 44m blue colored warfarin tablets on SUNDAYS, TUESDAYS and THURSDAYS. All OTHER days of the week, only one (1) tablet.  Patient verbalized understanding of these instructions.

## 2021-06-07 NOTE — Progress Notes (Signed)
PCP: Madalyn Rob, MD HF Cardiology: Dr. Aundra Dubin  70 y.o. with history of sarcoidosis, prior PE, RV failure was referred by Dr. Shearon Stalls for evaluation of CHF/pulmonary hypertension.  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  Patient was on prednisone for a period of time, this was stopped earlier this year.  He is on 2 L home oxygen.  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 He has had recurrent PEs/DVTs.  He has an IVC filter.  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  Most recent PE was in 2/21 when he had been off warfarin transiently.  Last echo in 5/21 showed EF 55-60% with D-shaped septum and severely dilated/severely dysfunctional RV.  No estimation of PA pressure was available.    RHC in 7/21 showed normal PCWP and moderate PAH with PVR 5 WU. V/Q showed perfusion defects but thought to be due to sarcoidosis.  CTA chest in 7/21 showed no acute or chronic PE, signs of chronic sarcoidosis were present.   Echo (5/22) with EF 55-60%, moderate focal basal septal hypertrophy, severe RV enlargement with moderately decreased systolic function.   Today he returns for HF follow up. Last visit (5/22) he was mildly volume overloaded and torsemide was increased. Overall feeling fine, gets around the house OK wearing oxygen 2 L. SOB with any physical exertion, this is his baseline. Still has some swelling in legs.  Denies increasing SOB, CP, dizziness, or PND/Orthopnea. Appetite ok. No fever or chills. Weight at home 184 pounds. Taking all medications. Wears CPAP nightly.   Labs (6/21): pro-BNP 43, K 4.6, creatinine 0.89 Labs (8/21): hgb 10.5, K 3.8, creatinine 1.28 Labs (10/21): K 5.4, creatinine 1.1 Labs (11/21): K 3.3, creatinine 1.14 Labs (1/22): BNP 365 Labs (2/22): K 4, creatinine 1.2 Labs (3/22): K 3.9, creatinine 1.49, BNP 21 Labs (5/22): K 3.2, creatinine 1.17  6 minute walk (11/21): 76 m 6 minute walk (1/22): 183  m 6 minute walk (3/22): 198 m 6 minute walk (5/22): 47 m  PMH: 1. Sarcoidosis: "Burned out."  Diagnosed in 1990 with bronchoscopy/biopsy.  Treated with prednisone, now off.  - On 2 L home oxygen chronically.  2. 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 H/o DVT and PE.  Has lupus anticoagulant.   - Most recent PE was in 2/21, he had been off warfarin transiently.  - V/Q scan in 7/21 with perfusion defects, CTA in 7/21 showed no acute or chronic thrombi, signs of sarcoidosis were present.  4. COVID-19 infection 1/21.  5. H/o compression fractures in 2/21.  - Had kyphoplasty 6. RV failure/pulmonary hypertension:  - Echo (5/21) with EF 55-60%, D-shaped interventricular septum, severely dilated RV with severe systolic dysfunction, dilated IVC.  - RHC (7/21) with mean RA 9, PA 62/21 mean 39, mean PCWP 11, CI 2.64, PVR 5 WU.  - Did not tolerate Tyvaso.  - Echo (5/22): EF 55-60%, moderate focal basal septal hypertrophy, severe RV enlargement with moderately decreased systolic function.  7. HTN 8. History of subdural hematoma remotely.   Social History   Socioeconomic History   Marital status: Divorced    Spouse name: Not on file   Number of children: Not on file   Years of education: Not on file   Highest education level: Not on file  Occupational History   Occupation: retired Pharmacist, hospital  Tobacco Use   Smoking status: Former    Packs/day: 1.50    Years: 23.00    Pack years:  34.50    Types: Cigarettes    Quit date: 1995    Years since quitting: 27.4   Smokeless tobacco: Never  Vaping Use   Vaping Use: Never used  Substance and Sexual Activity   Alcohol use: Never   Drug use: Not Currently    Types: "Crack" cocaine    Comment: quit 2008   Sexual activity: Not on file  Other Topics Concern   Not on file  Social History Narrative   Not on file   Social Determinants of Health   Financial Resource Strain: Not on file  Food Insecurity: Not on file  Transportation  Needs: Not on file  Physical Activity: Not on file  Stress: Not on file  Social Connections: Not on file  Intimate Partner Violence: Not on file   Family History  Adopted: Yes   ROS: All systems reviewed and negative except as per HPI.   Current Outpatient Medications  Medication Sig Dispense Refill   albuterol (VENTOLIN HFA) 108 (90 Base) MCG/ACT inhaler Inhale 2 puffs into the lungs every 6 (six) hours as needed for wheezing or shortness of breath. 18 g 5   alendronate (FOSAMAX) 70 MG tablet Take 1 tablet (70 mg total) by mouth once a week. 4 tablet 5   Ascorbic Acid (VITAMIN C) 500 MG CAPS Take 500 mg by mouth daily.     cholecalciferol (VITAMIN D3) 25 MCG (1000 UT) tablet Take 1,000 Units by mouth daily.     Fluticasone-Umeclidin-Vilant (TRELEGY ELLIPTA) 200-62.5-25 MCG/INH AEPB Inhale 1 puff into the lungs daily. 60 each 5   macitentan (OPSUMIT) 10 MG tablet Take 1 tablet (10 mg total) by mouth daily. 30 tablet    omega-3 acid ethyl esters (LOVAZA) 1 g capsule TAKE 1 CAPSULE BY MOUTH TWICE DAILY 180 capsule 0   potassium chloride (KLOR-CON) 20 MEQ packet Take 60 mEq by mouth daily. 90 packet 11   Riociguat (ADEMPAS) 2.5 MG TABS Take 2.5 mg by mouth in the morning, at noon, and at bedtime. 90 tablet 11   Selexipag 200 & 800 MCG TBPK Start 200 mcg BID. Increase by 200 mcg BID every 1-2 weeks as tolerated to maximum dose of 1600 mcg BID.     torsemide (DEMADEX) 20 MG tablet Take 4 tablets (80 mg total) by mouth every morning AND 2 tablets (40 mg total) every evening. 540 tablet 3   warfarin (COUMADIN) 4 MG tablet TAKE 1 TABLET BY MOUTH ONCE DAILY ON FRIDAY AND SATURDAY, AND TAKE 1 AND 1/2 TABLETS ON ALL OTHER DAYS 124 tablet 1   zinc gluconate 50 MG tablet Take 50 mg by mouth daily.     No current facility-administered medications for this encounter.   Wt Readings from Last 3 Encounters:  06/07/21 85.5 kg (188 lb 9.6 oz)  05/08/21 83.1 kg (183 lb 1.6 oz)  05/04/21 84.9 kg (187 lb  3.2 oz)    BP 102/60   Pulse 72   Wt 85.5 kg (188 lb 9.6 oz)   SpO2 95% Comment: on 2L  BMI 31.38 kg/m  Physical Exam: General:  NAD. No resp difficulty, arrived in wheelchair on oxygen, frail-appearing. HEENT: Normal Neck: Supple. JVP~6-7. Carotids 2+ bilat; no bruits. No lymphadenopathy or thryomegaly appreciated. Cor: PMI nondisplaced. Regular rate & rhythm. No rubs, gallops or murmurs. Lungs: Clear Abdomen: Obese, soft, nontender, nondistended. No hepatosplenomegaly. No bruits or masses. Good bowel sounds. Extremities: No cyanosis, clubbing, rash, 1+ LE edema R>L Neuro: alert & oriented x 3, cranial  nerves grossly intact. Moves all 4 extremities w/o difficulty. Affect pleasant.  Assessment/Plan: 1. RV failure, pulmonary hypertension: Echo in 5/21 showed EF 55-60%, D-shaped septum with severe RV dilation/severe RV dysfunction.  [place] in 7/21 showed mildly elevated RA pressure, moderate PAH with PVR 5 WU.  V/Q scan showed perfusion defects, but CTA chest in 7/21 showed no acute or chronic PE, V/Q findings were likely due to chronic sarcoidosis changes in lungs.  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  Echo in 5/22 showed mildly improved RV function compared to 5/21. 6 minute walk today stable.  He did not tolerate Tyvaso. NYHA class III symptoms chronically. His volume looks good today, some mild swelling in legs. - Continue torsemide to 80 qam/40 qpm.  BMET/BNP today. - Continue Opsumit 10 mg daily.  - Continue Adempas 2.5 mg tid.   - Continue to uptitrate selexipag, aiming for 1600 mcg bid. Had jaw claudication and leg pain on 1400 mcg, backed off to 800 mcg with resolution of symptoms. Now slowly increasing back to 1000 mcg. - Continue home oxygen and CPAP.  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  Has positive lupus anticoagulant.  - Continue warfarin, should have Lovenox bridge if stops warfarin (had PE when off warfarin transiently in  2/21). INR checked by PCP. 3. Sarcoidosis: History of "burned out" pulmonary sarcoidosis.  This may be the cause of his pulmonary hypertension.  No definitive evidence for cardiac sarcoidosis, but could arrange for cardiac MRI in the future to investigate. He is no longer on prednisone.  - Continue 2 L home oxygen.  4. HTN: BP controlled. Continue current. 5. OSA: Use CPAP.   Followup 2-3 months with Dr. Newman Nip, FNP-BC 06/07/2021

## 2021-06-07 NOTE — Progress Notes (Signed)
Anticoagulation Management Jesus Russell is a 70 y.o. male who reports to the clinic for monitoring of warfarin treatment.    Indication: DVT , history of; PE, history of; Long term current use of oral anticoagulant warfarin to maintain INR 2.0 - 3.0.  Duration: indefinite Supervising physician: Lalla Brothers  Anticoagulation Clinic Visit History: Patient does not report signs/symptoms of bleeding or thromboembolism  Other recent changes: No diet, medications, lifestyle changes endorsed by the patient at this visit.  Anticoagulation Episode Summary     Current INR goal:  2.0-3.0  TTR:  67.9 % (1.6 y)  Next INR check:  07/03/2021  INR from last check:  3.5 (06/07/2021)  Weekly max warfarin dose:    Target end date:  Indefinite  INR check location:    Preferred lab:    Send INR reminders to:     Indications   Long term (current) use of anticoagulants [Z79.01] History of DVT (deep vein thrombosis) [Z86.718] Pulmonary embolism on long-term anticoagulation therapy (HCC) [I26.99 T45.515A] History of pulmonary embolism [Z86.711]        Comments:           No Known Allergies  Current Outpatient Medications:    albuterol (VENTOLIN HFA) 108 (90 Base) MCG/ACT inhaler, Inhale 2 puffs into the lungs every 6 (six) hours as needed for wheezing or shortness of breath., Disp: 18 g, Rfl: 5   alendronate (FOSAMAX) 70 MG tablet, Take 1 tablet (70 mg total) by mouth once a week., Disp: 4 tablet, Rfl: 5   Ascorbic Acid (VITAMIN C) 500 MG CAPS, Take 500 mg by mouth daily., Disp: , Rfl:    cholecalciferol (VITAMIN D3) 25 MCG (1000 UT) tablet, Take 1,000 Units by mouth daily., Disp: , Rfl:    Fluticasone-Umeclidin-Vilant (TRELEGY ELLIPTA) 200-62.5-25 MCG/INH AEPB, Inhale 1 puff into the lungs daily., Disp: 60 each, Rfl: 5   macitentan (OPSUMIT) 10 MG tablet, Take 1 tablet (10 mg total) by mouth daily., Disp: 30 tablet, Rfl:    omega-3 acid ethyl esters (LOVAZA) 1 g capsule, TAKE 1 CAPSULE BY  MOUTH TWICE DAILY, Disp: 180 capsule, Rfl: 0   potassium chloride (KLOR-CON) 20 MEQ packet, Take 60 mEq by mouth daily., Disp: 90 packet, Rfl: 11   Riociguat (ADEMPAS) 2.5 MG TABS, Take 2.5 mg by mouth in the morning, at noon, and at bedtime., Disp: 90 tablet, Rfl: 11   Selexipag 200 & 800 MCG TBPK, Start 200 mcg BID. Increase by 200 mcg BID every 1-2 weeks as tolerated to maximum dose of 1600 mcg BID., Disp: , Rfl:    torsemide (DEMADEX) 20 MG tablet, Take 4 tablets (80 mg total) by mouth every morning AND 2 tablets (40 mg total) every evening., Disp: 540 tablet, Rfl: 3   warfarin (COUMADIN) 4 MG tablet, TAKE 1 TABLET BY MOUTH ONCE DAILY ON FRIDAY AND SATURDAY, AND TAKE 1 AND 1/2 TABLETS ON ALL OTHER DAYS, Disp: 124 tablet, Rfl: 1   zinc gluconate 50 MG tablet, Take 50 mg by mouth daily., Disp: , Rfl:    atropine 1 % ophthalmic solution, Place 1 drop into the right eye daily at 6 (six) AM. 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    Social History   Socioeconomic History   Marital status: Divorced    Spouse name: Not on file   Number of children: Not on file   Years of education: Not on file   Highest education level: Not on file  Occupational History   Occupation: retired Pharmacist, hospital   Tobacco Use   Smoking status: Former    Packs/day: 1.50    Years: 23.00    Pack years: 34.50    Types: Cigarettes    Quit date: 1995    Years since quitting: 27.4   Smokeless tobacco: Never  Vaping Use   Vaping Use: Never used  Substance and Sexual Activity   Alcohol use: Never   Drug use: Not Currently    Types: "Crack" cocaine    Comment: quit 2008   Sexual activity: Not on file  Other Topics Concern   Not on file  Social History Narrative   Not on file   Social Determinants of Health   Financial Resource Strain: Not on file  Food Insecurity: Not on file  Transportation Needs: Not on file  Physical Activity: Not on file  Stress: Not on file  Social Connections: Not on file   Family History  Adopted: Yes    ASSESSMENT Recent Results: The most recent result is correlated with 38 mg per week: Lab Results  Component Value Date   INR 3.5 (A) 06/07/2021   INR 2.1 05/08/2021   INR 2.7 04/10/2021    Anticoagulation Dosing: Description   Take one-and-one-half (1 & 1/2) of your 34m blue colored warfarin tablets on SUNDAYS, TUESDAYS and THURSDAYS. All OTHER days of the week, only one (1) tablet.      INR today: Supratherapeutic  PLAN Weekly dose was decreased by 11% to 34 mg per week  Patient Instructions  Patient instructed to take medications as defined in the Anti-coagulation Track section of this encounter.  Patient instructed to take today's dose.  Patient instructed to take one-and-one-half (1 & 1/2) of your 429mblue colored warfarin tablets on SUNDAYS, TUESDAYS and THURSDAYS. All OTHER days of the week, only one (1) tablet.  Patient verbalized understanding of these instructions.   Patient advised to contact clinic or seek medical attention if signs/symptoms of bleeding or thromboembolism occur.  Patient verbalized understanding by repeating back information and was advised to contact me if further medication-related questions arise. Patient was also  provided an information handout.  Follow-up Return in 26 days (on 07/03/2021) for Follow up INR.  JaPennie BanterPharmD, CPP  15 minutes spent face-to-face with the patient during the encounter. 50% of time spent on education, including signs/sx bleeding and clotting, as well as food and drug interactions with warfarin. 50% of time was spent on fingerprick POC INR sample collection,processing, results determination, and documentation in [email or web address]

## 2021-06-21 ENCOUNTER — Other Ambulatory Visit: Payer: Self-pay | Admitting: Pharmacist

## 2021-06-21 MED ORDER — WARFARIN SODIUM 4 MG PO TABS: ORAL_TABLET | ORAL | 1 refills | Status: DC

## 2021-06-21 NOTE — Telephone Encounter (Signed)
Patient called requesting refill of his warfarin. Will send e-script to his pharmacy.

## 2021-06-27 ENCOUNTER — Encounter: Payer: Self-pay | Admitting: *Deleted

## 2021-06-30 ENCOUNTER — Other Ambulatory Visit (HOSPITAL_COMMUNITY): Payer: Self-pay | Admitting: *Deleted

## 2021-06-30 MED ORDER — POTASSIUM CHLORIDE 20 MEQ PO PACK: 60.0000 meq | PACK | Freq: Every day | ORAL | 11 refills | Status: DC

## 2021-07-03 ENCOUNTER — Ambulatory Visit: Payer: Medicare Other

## 2021-07-04 ENCOUNTER — Other Ambulatory Visit (HOSPITAL_COMMUNITY): Payer: Self-pay | Admitting: Unknown Physician Specialty

## 2021-07-04 MED ORDER — SELEXIPAG 1000 MCG PO TABS: 1000.0000 ug | ORAL_TABLET | Freq: Two times a day (BID) | ORAL | 6 refills | Status: DC

## 2021-07-05 ENCOUNTER — Encounter: Payer: Self-pay | Admitting: *Deleted

## 2021-07-05 ENCOUNTER — Ambulatory Visit (INDEPENDENT_AMBULATORY_CARE_PROVIDER_SITE_OTHER): Payer: Medicare Other | Admitting: Pharmacist

## 2021-07-05 DIAGNOSIS — Z7901 Long term (current) use of anticoagulants: Secondary | ICD-10-CM | POA: Diagnosis present

## 2021-07-05 DIAGNOSIS — Z86711 Personal history of pulmonary embolism: Secondary | ICD-10-CM

## 2021-07-05 DIAGNOSIS — Z86718 Personal history of other venous thrombosis and embolism: Secondary | ICD-10-CM

## 2021-07-05 DIAGNOSIS — I2699 Other pulmonary embolism without acute cor pulmonale: Secondary | ICD-10-CM | POA: Diagnosis not present

## 2021-07-05 LAB — POCT INR: INR: 2.9 (ref 2.0–3.0)

## 2021-07-05 NOTE — Patient Instructions (Signed)
Patient instructed to take medications as defined in the Anti-coagulation Track section of this encounter.  Patient instructed to take today's dose.  Patient instructed to take one-and-one-half (1 & 1/2) of your 33m blue colored warfarin tablets on  TUESDAYS. All OTHER days of the week, only one (1) tablet.  Patient verbalized understanding of these instructions.

## 2021-07-05 NOTE — Progress Notes (Unsigned)
Dear PCP Your patient is scheduled for a Medicare annual wellness visit with the RN.  Please create a new encounter, use the smart phrase [place], fill out the "Personalized health plan", "Things that may be affecting your health", and "Medicare covered preventative screenings and services".  Please do not address every item in the preventative screenings and services but the ones that will be most meaningful and relevant to your patient. Please sign and route the form within the next 7 days to [facility].

## 2021-07-05 NOTE — Progress Notes (Signed)
Anticoagulation Management Jesus Russell is a 70 y.o. male who reports to the clinic for monitoring of warfarin treatment.    Indication: PE , History of; DVT, History of; Long term current use of warfarin to maintain INR 2.0 - 3.0.  Duration: indefinite Supervising physician: Gilles Chiquito  Anticoagulation Clinic Visit History: Patient does not report signs/symptoms of bleeding or thromboembolism.  Other recent changes: No diet, medications, lifestyle changes endorsed by the patient at this visit.  Anticoagulation Episode Summary     Current INR goal:  2.0-3.0  TTR:  65.5 % (1.6 y)  Next INR check:  07/31/2021  INR from last check:  2.9 (07/05/2021)  Weekly max warfarin dose:    Target end date:  Indefinite  INR check location:    Preferred lab:    Send INR reminders to:     Indications   Long term (current) use of anticoagulants [Z79.01] History of DVT (deep vein thrombosis) [Z86.718] Pulmonary embolism on long-term anticoagulation therapy (HCC) [I26.99 T45.515A] History of pulmonary embolism [Z86.711]        Comments:           No Known Allergies  Current Outpatient Medications:    albuterol (VENTOLIN HFA) 108 (90 Base) MCG/ACT inhaler, Inhale 2 puffs into the lungs every 6 (six) hours as needed for wheezing or shortness of breath., Disp: 18 g, Rfl: 5   alendronate (FOSAMAX) 70 MG tablet, Take 1 tablet (70 mg total) by mouth once a week., Disp: 4 tablet, Rfl: 5   Ascorbic Acid (VITAMIN C) 500 MG CAPS, Take 500 mg by mouth daily., Disp: , Rfl:    cholecalciferol (VITAMIN D3) 25 MCG (1000 UT) tablet, Take 1,000 Units by mouth daily., Disp: , Rfl:    Fluticasone-Umeclidin-Vilant (TRELEGY ELLIPTA) 200-62.5-25 MCG/INH AEPB, Inhale 1 puff into the lungs daily., Disp: 60 each, Rfl: 5   macitentan (OPSUMIT) 10 MG tablet, Take 1 tablet (10 mg total) by mouth daily., Disp: 30 tablet, Rfl:    omega-3 acid ethyl esters (LOVAZA) 1 g capsule, TAKE 1 CAPSULE BY MOUTH TWICE DAILY, Disp:  180 capsule, Rfl: 0   potassium chloride (KLOR-CON) 20 MEQ packet, Take 60 mEq by mouth daily., Disp: 90 packet, Rfl: 11   Riociguat (ADEMPAS) 2.5 MG TABS, Take 2.5 mg by mouth in the morning, at noon, and at bedtime., Disp: 90 tablet, Rfl: 11   Selexipag 1000 MCG TABS, Take 1,000 mcg by mouth in the morning and at bedtime., Disp: 60 tablet, Rfl: 6   torsemide (DEMADEX) 20 MG tablet, Take 4 tablets (80 mg total) by mouth every morning AND 2 tablets (40 mg total) every evening., Disp: 540 tablet, Rfl: 3   warfarin (COUMADIN) 4 MG tablet, TAKE 1 TABLET BY MOUTH ON MONDAYS, WEDNESDAYS, FRIDAYS AND SATURDAYS. 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    Social History   Socioeconomic History   Marital status: Divorced    Spouse name: Not on file   Number of children: Not on file   Years of education: Not on file   Highest education level: Not on file  Occupational History   Occupation: retired Pharmacist, hospital  Tobacco Use   Smoking status: Former    Packs/day: 1.50    Years: 23.00    Pack years: 34.50    Types: Cigarettes    Quit date: 1995    Years since quitting: 27.5   Smokeless tobacco:  Never  Vaping Use   Vaping Use: Never used  Substance and Sexual Activity   Alcohol use: Never   Drug use: Not Currently    Types: "Crack" cocaine    Comment: quit 2008   Sexual activity: Not on file  Other Topics Concern   Not on file  Social History Narrative   Not on file   Social Determinants of Health   Financial Resource Strain: Not on file  Food Insecurity: Not on file  Transportation Needs: Not on file  Physical Activity: Not on file  Stress: Not on file  Social Connections: Not on file   Family History  Adopted: Yes    ASSESSMENT Recent Results: The most recent result is correlated with 34 mg per week: Lab Results  Component Value Date   INR 2.9 07/05/2021   INR 3.5 (A) 06/07/2021   INR 2.1 05/08/2021    Anticoagulation Dosing: Description   Take one-and-one-half (1 & 1/2) of your 30m blue colored warfarin tablets on  TUESDAYS. All OTHER days of the week, only one (1) tablet.      INR today: Therapeutic  PLAN Weekly dose was decreased by 12% to 30 mg per week  Patient Instructions  Patient instructed to take medications as defined in the Anti-coagulation Track section of this encounter.  Patient instructed to take today's dose.  Patient instructed to take one-and-one-half (1 & 1/2) of your 475mblue colored warfarin tablets on  TUESDAYS. All OTHER days of the week, only one (1) tablet.  Patient verbalized understanding of these instructions.   Patient advised to contact clinic or seek medical attention if signs/symptoms of bleeding or thromboembolism occur.  Patient verbalized understanding by repeating back information and was advised to contact me if further medication-related questions arise. Patient was also provided an information handout.  Follow-up Return in 26 days (on 07/31/2021) for Follow up INR.  JaPennie BanterPharmD, CPP  15 minutes spent face-to-face with the patient during the encounter. 50% of time spent on education, including  signs/sx bleeding and clotting, as well as food and drug interactions with warfarin. 50% of time was spent on fingerprick POC INR sample collection,processing, results determination, and documentation in [email or web address]

## 2021-07-06 ENCOUNTER — Telehealth (HOSPITAL_COMMUNITY): Payer: Self-pay | Admitting: Pharmacy Technician

## 2021-07-06 NOTE — Telephone Encounter (Signed)
Patient Advocate Encounter   Received notification from [employer] that prior authorization for Jesus Russell is required.   PA submitted on CoverMyMeds Key [id number] Status is pending   Will continue to follow.

## 2021-07-09 NOTE — Progress Notes (Signed)
Evaluation and management procedures were performed by the Clinical Pharmacy Practitioner under my supervision and collaboration. I have reviewed the Practitioner's note and chart, and I agree with the management and plan as documented above. ° °

## 2021-07-13 ENCOUNTER — Other Ambulatory Visit (HOSPITAL_COMMUNITY): Payer: Self-pay | Admitting: *Deleted

## 2021-07-13 ENCOUNTER — Encounter: Payer: Self-pay | Admitting: Internal Medicine

## 2021-07-14 ENCOUNTER — Other Ambulatory Visit: Payer: Self-pay | Admitting: Internal Medicine

## 2021-07-14 MED ORDER — OMEGA-3-ACID ETHYL ESTERS 1 G PO CAPS: 1.0000 | ORAL_CAPSULE | Freq: Two times a day (BID) | ORAL | 1 refills | Status: DC

## 2021-07-19 NOTE — Telephone Encounter (Signed)
Advanced Heart Failure Patient Advocate Encounter  Prior Authorization for Jesus Russell has been approved.    PA#  [id number] Effective dates: 07/06/21 through 12/23/21  Charlann Boxer, CPhT

## 2021-07-31 ENCOUNTER — Ambulatory Visit: Payer: Medicare Other

## 2021-08-04 ENCOUNTER — Encounter: Payer: Self-pay | Admitting: *Deleted

## 2021-08-04 NOTE — Progress Notes (Unsigned)
Dear PCP Your patient is scheduled for a Medicare annual wellness visit with the RN.  Please create a new encounter, use the smart phrase [place], fill out the "Personalized health plan", "Things that may be affecting your health", and "Medicare covered preventative screenings and services".  Please do not address every item in the preventative screenings and services but the ones that will be most meaningful and relevant to your patient. Please sign and route the form within the next 7 days to [facility].

## 2021-08-07 ENCOUNTER — Ambulatory Visit (INDEPENDENT_AMBULATORY_CARE_PROVIDER_SITE_OTHER): Payer: Medicare Other | Admitting: Pharmacist

## 2021-08-07 DIAGNOSIS — Z86718 Personal history of other venous thrombosis and embolism: Secondary | ICD-10-CM

## 2021-08-07 DIAGNOSIS — Z7901 Long term (current) use of anticoagulants: Secondary | ICD-10-CM

## 2021-08-07 DIAGNOSIS — Z5181 Encounter for therapeutic drug level monitoring: Secondary | ICD-10-CM | POA: Diagnosis not present

## 2021-08-07 DIAGNOSIS — T45515A Adverse effect of anticoagulants, initial encounter: Secondary | ICD-10-CM | POA: Diagnosis not present

## 2021-08-07 DIAGNOSIS — Z86711 Personal history of pulmonary embolism: Secondary | ICD-10-CM | POA: Diagnosis not present

## 2021-08-07 DIAGNOSIS — I2699 Other pulmonary embolism without acute cor pulmonale: Secondary | ICD-10-CM | POA: Diagnosis not present

## 2021-08-07 LAB — POCT INR: INR: 2.1 (ref 2.0–3.0)

## 2021-08-07 NOTE — Progress Notes (Signed)
Anticoagulation Management Jesus Russell is a 70 y.o. male who reports to the clinic for monitoring of warfarin treatment.    Indication: DVT and PE, as index event and then a subsequent PE while anticoagulated with warfarin, to maintain INR 2.0 - 3.0.  Duration: indefinite Supervising physician: Aldine Contes  Anticoagulation Clinic Visit History: Patient does not report signs/symptoms of bleeding or thromboembolism  Other recent changes: No diet, medications, lifestyle changes endorsed by the patient at this visit.  Anticoagulation Episode Summary     Current INR goal:  2.0-3.0  TTR:  67.3 % (1.7 y)  Next INR check:  09/04/2021  INR from last check:  2.1 (08/07/2021)  Weekly max warfarin dose:    Target end date:  Indefinite  INR check location:    Preferred lab:    Send INR reminders to:     Indications   Long term (current) use of anticoagulants [Z79.01] History of DVT (deep vein thrombosis) [Z86.718] Pulmonary embolism on long-term anticoagulation therapy (HCC) [I26.99 T45.515A] History of pulmonary embolism [Z86.711]        Comments:           No Known Allergies  Current Outpatient Medications:    albuterol (VENTOLIN HFA) 108 (90 Base) MCG/ACT inhaler, Inhale 2 puffs into the lungs every 6 (six) hours as needed for wheezing or shortness of breath., Disp: 18 g, Rfl: 5   alendronate (FOSAMAX) 70 MG tablet, Take 1 tablet (70 mg total) by mouth once a week., Disp: 4 tablet, Rfl: 5   Ascorbic Acid (VITAMIN C) 500 MG CAPS, Take 500 mg by mouth daily., Disp: , Rfl:    cholecalciferol (VITAMIN D3) 25 MCG (1000 UT) tablet, Take 1,000 Units by mouth daily., Disp: , Rfl:    Fluticasone-Umeclidin-Vilant (TRELEGY ELLIPTA) 200-62.5-25 MCG/INH AEPB, Inhale 1 puff into the lungs daily., Disp: 60 each, Rfl: 5   macitentan (OPSUMIT) 10 MG tablet, Take 1 tablet (10 mg total) by mouth daily., Disp: 30 tablet, Rfl:    omega-3 acid ethyl esters (LOVAZA) 1 g capsule, Take 1 capsule (1  g total) by mouth 2 (two) times daily., Disp: 180 capsule, Rfl: 1   potassium chloride (KLOR-CON) 20 MEQ packet, Take 60 mEq by mouth daily., Disp: 90 packet, Rfl: 11   Riociguat (ADEMPAS) 2.5 MG TABS, Take 2.5 mg by mouth in the morning, at noon, and at bedtime., Disp: 90 tablet, Rfl: 11   Selexipag 1000 MCG TABS, Take 1,000 mcg by mouth in the morning and at bedtime., Disp: 60 tablet, Rfl: 6   torsemide (DEMADEX) 20 MG tablet, Take 4 tablets (80 mg total) by mouth every morning AND 2 tablets (40 mg total) every evening., Disp: 540 tablet, Rfl: 3   warfarin (COUMADIN) 4 MG tablet, TAKE 1 TABLET BY MOUTH ON MONDAYS, WEDNESDAYS, FRIDAYS AND SATURDAYS. 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) on  05/27/2018   Pulmonary artery hypertension ([place]) 04/2011   Mildly elevated pulmonary artery pressure in setting of PE (from [facility])   Pulmonary embolism associated with COVID-19 ([place])    Subdural hematoma (HCC)    Remote episode, occurred when taking excedrin and warfarin.    Social History   Socioeconomic History   Marital status: Divorced    Spouse name: Not on file   Number of children: Not on file   Years of education: Not on file   Highest education level: Not on file  Occupational History   Occupation: retired Pharmacist, hospital  Tobacco Use   Smoking status: Former    Packs/day: 1.50    Years: 23.00    Pack years: 34.50    Types: Cigarettes    Quit date: 1995     Years since quitting: 27.6   Smokeless tobacco: Never  Vaping Use   Vaping Use: Never used  Substance and Sexual Activity   Alcohol use: Never   Drug use: Not Currently    Types: "Crack" cocaine    Comment: quit 2008   Sexual activity: Not on file  Other Topics Concern   Not on file  Social History Narrative   Not on file   Social Determinants of Health   Financial Resource Strain: Not on file  Food Insecurity: Not on file  Transportation Needs: Not on file  Physical Activity: Not on file  Stress: Not on file  Social Connections: Not on file   Family History  Adopted: Yes    ASSESSMENT Recent Results: The most recent result is correlated with 30 mg per week: Lab Results  Component Value Date   INR 2.1 08/07/2021   INR 2.9 07/05/2021   INR 3.5 (A) 06/07/2021    Anticoagulation Dosing: Description   Take one-and-one-half (1 & 1/2) of your 9m blue colored warfarin tablets on  Mondays, Wednesdays and Fridays. All OTHER days of the week, only one (1) tablet.      INR today: Therapeutic  PLAN Weekly dose was increased by 13% to 34 mg per week  Patient Instructions  Patient instructed to take medications as defined in the Anti-coagulation Track section of this encounter.  Patient instructed to take today's dose.  Patient instructed to take one-and-one-half (1 & 1/2) of your 421mblue colored warfarin tablets on  Mondays, Wednesdays and Fridays. All OTHER days of the week, only one (1) tablet.  Patient verbalized understanding of these instructions.   Patient advised to contact clinic or seek medical attention if signs/symptoms of bleeding or thromboembolism occur.  Patient verbalized understanding by repeating back information and was advised to contact me if further medication-related questions arise. Patient was also provided an information handout.  Follow-up Return in 4 weeks (on 09/04/2021) for Follow up INR.  JaPennie BanterPharmD, CPP  15 minutes spent  face-to-face with the patient during the encounter. 50% of time spent on education, including signs/sx bleeding and clotting, as well as food and drug interactions with warfarin. 50% of time was spent on fingerprick POC INR sample collection,processing, results determination, and documentation in [email or web address]

## 2021-08-07 NOTE — Patient Instructions (Signed)
Patient instructed to take medications as defined in the Anti-coagulation Track section of this encounter.  Patient instructed to take today's dose.  Patient instructed to take one-and-one-half (1 & 1/2) of your 70m blue colored warfarin tablets on  Mondays, Wednesdays and Fridays. All OTHER days of the week, only one (1) tablet.  Patient verbalized understanding of these instructions.

## 2021-08-09 NOTE — Progress Notes (Signed)
INTERNAL MEDICINE TEACHING ATTENDING ADDENDUM - Aldine Contes M.D  Duration- indefinite, Indication- PE, INR- therapeutic. Agree with pharmacy recommendations as outlined in their note.

## 2021-08-14 ENCOUNTER — Encounter: Payer: Self-pay | Admitting: Internal Medicine

## 2021-08-14 NOTE — Progress Notes (Unsigned)
Things That May Be Affecting Your Health:  Alcohol  Hearing loss  Pain    Depression  Home Safety  Sexual Health   Diabetes x Lack of physical activity  Stress  x Difficulty with daily activities  Loneliness  Tiredness   Drug use  Medicines  Tobacco use   Falls  Motor Vehicle Safety  Weight  x Food choices  Oral Health  Other    YOUR PERSONALIZED HEALTH PLAN : 1. Schedule your next subsequent Medicare Wellness visit in one year 2. Attend all of your regular appointments to address your medical issues 3. Complete the preventative screenings and services   Annual Wellness Visit   Medicare Covered Preventative Screenings and [place] Men and Women Who How Often Need? Date of Last Service Action  Abdominal Aortic Aneurysm Adults with AAA risk factors Once      Alcohol Misuse and Counseling All Adults Screening once a year if no alcohol misuse. Counseling up to 4 face to face sessions.     Bone Density Measurement  Adults at risk for osteoporosis Once every 2 yrs      Lipid Panel Z13.6 All adults without CV disease Once every 5 yrs       Colorectal Cancer  Stool sample or Colonoscopy All adults 64 and older  Once every year Every 10 years        Depression All Adults Once a year  Today   Diabetes Screening Blood glucose, post glucose load, or GTT Z13.1 All adults at risk Pre-diabetics Once per year Twice per year      Diabetes  Self-Management Training All adults Diabetics 10 hrs first year; 2 hours subsequent years. Requires Copay     Glaucoma Diabetics Family history of glaucoma African Americans 56 yrs + Hispanic Americans 73 yrs + Annually - requires coppay      Hepatitis C Z72.89 or F19.20 High Risk for HCV Born between 1945 and 1965 Annually Once      HIV Z11.4 All adults based on risk Annually btw ages 72 & 68 regardless of risk Annually > 65 yrs if at increased risk      Lung Cancer Screening Asymptomatic adults aged 35-77 with 30  pack yr history and current smoker OR quit within the last 15 yrs Annually Must have counseling and shared decision making documentation before first screen      Medical Nutrition Therapy Adults with  Diabetes Renal disease Kidney transplant within past 3 yrs 3 hours first year; 2 hours subsequent years     Obesity and Counseling All adults Screening once a year Counseling if BMI 30 or higher  Today   Tobacco Use Counseling Adults who use tobacco  Up to 8 visits in one year     Vaccines Z23 Hepatitis B Influenza  Pneumonia  Adults  Once Once every flu season Two different vaccines separated by one year     Next Annual Wellness Visit People with Medicare Every year  Today     Services & Screenings Women Who How Often Need  Date of Last Service Action  Mammogram  Z12.31 Women over 83 One baseline ages 82-39. Annually ager 40 yrs+      Pap tests All women Annually if high risk. Every 2 yrs for normal risk women      Screening for cervical cancer with  Pap (Z01.419 nl or Z01.411abnl) & HPV Z11.51 Women aged 76 to 72 Once every 5 yrs  Screening pelvic and breast exams All women Annually if high risk. Every 2 yrs for normal risk women     Sexually Transmitted Diseases Chlamydia Gonorrhea Syphilis All at risk adults Annually for non pregnant females at increased risk         [place] Men Who How Ofter Need  Date of Last Service Action  Prostate Cancer - DRE & PSA Men over 50 Annually.  DRE might require a copay.        Sexually Transmitted Diseases Syphilis All at risk adults Annually for men at increased risk      Health Maintenance List Health Maintenance  Topic Date Due   Zoster Vaccines- Shingrix (1 of 2) Never done   COVID-19 Vaccine (4 - Booster for Pfizer series) 02/23/2021   INFLUENZA VACCINE  07/24/2021   TETANUS/TDAP  05/08/2022 (Originally 07/16/1970)   PNA vac Low Risk Adult (2 of 2 - PPSV23) 02/06/2022   HPV VACCINES  Aged Out    Hepatitis C Screening  Discontinued

## 2021-08-30 ENCOUNTER — Encounter (INDEPENDENT_AMBULATORY_CARE_PROVIDER_SITE_OTHER): Payer: Medicare Other | Admitting: Ophthalmology

## 2021-09-04 ENCOUNTER — Ambulatory Visit (INDEPENDENT_AMBULATORY_CARE_PROVIDER_SITE_OTHER): Payer: Medicare Other | Admitting: Pharmacist

## 2021-09-04 DIAGNOSIS — Z23 Encounter for immunization: Secondary | ICD-10-CM

## 2021-09-04 DIAGNOSIS — I2699 Other pulmonary embolism without acute cor pulmonale: Secondary | ICD-10-CM | POA: Diagnosis not present

## 2021-09-04 DIAGNOSIS — Z86711 Personal history of pulmonary embolism: Secondary | ICD-10-CM | POA: Diagnosis not present

## 2021-09-04 DIAGNOSIS — Z86718 Personal history of other venous thrombosis and embolism: Secondary | ICD-10-CM | POA: Diagnosis not present

## 2021-09-04 DIAGNOSIS — Z7901 Long term (current) use of anticoagulants: Secondary | ICD-10-CM | POA: Diagnosis present

## 2021-09-04 DIAGNOSIS — T45515A Adverse effect of anticoagulants, initial encounter: Secondary | ICD-10-CM | POA: Diagnosis not present

## 2021-09-04 DIAGNOSIS — Z5181 Encounter for therapeutic drug level monitoring: Secondary | ICD-10-CM | POA: Diagnosis not present

## 2021-09-04 LAB — POCT INR: INR: 2.3 (ref 2.0–3.0)

## 2021-09-04 NOTE — Patient Instructions (Signed)
Patient instructed to take medications as defined in the Anti-coagulation Track section of this encounter.  Patient instructed to take today's dose.  Patient instructed to take one-and-one-half (1 & 1/2) of your 70m blue colored warfarin tablets on  Mondays, Wednesdays and Fridays. All OTHER days of the week, only one (1) tablet.  Patient verbalized understanding of these instructions.

## 2021-09-04 NOTE — Progress Notes (Signed)
Anticoagulation Management Jesus Russell is a 70 y.o. male who reports to the clinic for monitoring of warfarin treatment.    Indication: PE , history of; DVT, History of; Long term current use of oral anticoagulant warfarin to maintain INR 2.0 - 3.0. Fluad flu vaccine administered today during this visit.  Duration: indefinite Supervising physician: Joni Reining  Anticoagulation Clinic Visit History: Patient does not report signs/symptoms of bleeding or thromboembolism  Other recent changes: No diet, medications, lifestyle changes endorsed by the patient at this visit to me. Anticoagulation Episode Summary     Current INR goal:  2.0-3.0  TTR:  68.6 % (1.8 y)  Next INR check:  10/02/2021  INR from last check:  2.3 (09/04/2021)  Weekly max warfarin dose:    Target end date:  Indefinite  INR check location:    Preferred lab:    Send INR reminders to:     Indications   Long term (current) use of anticoagulants [Z79.01] History of DVT (deep vein thrombosis) [Z86.718] Pulmonary embolism on long-term anticoagulation therapy (HCC) [I26.99 T45.515A] History of pulmonary embolism [Z86.711]        Comments:           No Known Allergies  Current Outpatient Medications:    albuterol (VENTOLIN HFA) 108 (90 Base) MCG/ACT inhaler, Inhale 2 puffs into the lungs every 6 (six) hours as needed for wheezing or shortness of breath., Disp: 18 g, Rfl: 5   alendronate (FOSAMAX) 70 MG tablet, Take 1 tablet (70 mg total) by mouth once a week., Disp: 4 tablet, Rfl: 5   Ascorbic Acid (VITAMIN C) 500 MG CAPS, Take 500 mg by mouth daily., Disp: , Rfl:    cholecalciferol (VITAMIN D3) 25 MCG (1000 UT) tablet, Take 1,000 Units by mouth daily., Disp: , Rfl:    Fluticasone-Umeclidin-Vilant (TRELEGY ELLIPTA) 200-62.5-25 MCG/INH AEPB, Inhale 1 puff into the lungs daily., Disp: 60 each, Rfl: 5   macitentan (OPSUMIT) 10 MG tablet, Take 1 tablet (10 mg total) by mouth daily., Disp: 30 tablet, Rfl:    omega-3  acid ethyl esters (LOVAZA) 1 g capsule, Take 1 capsule (1 g total) by mouth 2 (two) times daily., Disp: 180 capsule, Rfl: 1   potassium chloride (KLOR-CON) 20 MEQ packet, Take 60 mEq by mouth daily., Disp: 90 packet, Rfl: 11   Riociguat (ADEMPAS) 2.5 MG TABS, Take 2.5 mg by mouth in the morning, at noon, and at bedtime., Disp: 90 tablet, Rfl: 11   Selexipag 1000 MCG TABS, Take 1,000 mcg by mouth in the morning and at bedtime., Disp: 60 tablet, Rfl: 6   torsemide (DEMADEX) 20 MG tablet, Take 4 tablets (80 mg total) by mouth every morning AND 2 tablets (40 mg total) every evening., Disp: 540 tablet, Rfl: 3   warfarin (COUMADIN) 4 MG tablet, TAKE 1 TABLET BY MOUTH ON MONDAYS, WEDNESDAYS, FRIDAYS AND SATURDAYS. 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    Social History   Socioeconomic History   Marital status: Divorced    Spouse name: Not on file   Number of children: Not on file   Years of education: Not on file   Highest education level: Not on file  Occupational History   Occupation: retired Pharmacist, hospital  Tobacco Use   Smoking status: Former    Packs/day: 1.50    Years: 23.00    Pack  years: 34.50    Types: Cigarettes    Quit date: 1995    Years since quitting: 27.7   Smokeless tobacco: Never  Vaping Use   Vaping Use: Never used  Substance and Sexual Activity   Alcohol use: Never   Drug use: Not Currently    Types: "Crack" cocaine    Comment: quit 2008   Sexual activity: Not on file  Other Topics Concern   Not on file  Social History Narrative   Not on file   Social Determinants of Health   Financial Resource Strain: Not on file  Food Insecurity: Not on file  Transportation Needs: Not on file  Physical Activity: Not on file  Stress: Not on file  Social Connections: Not on file   Family History  Adopted: Yes    ASSESSMENT Recent Results: The most recent result is correlated with 34 mg per week: Lab Results  Component Value Date   INR 2.3 09/04/2021   INR 2.1 08/07/2021   INR 2.9 07/05/2021    Anticoagulation Dosing: Description   Take one-and-one-half (1 & 1/2) of your 33m blue colored warfarin tablets on  Mondays, Wednesdays and Fridays. All OTHER days of the week, only one (1) tablet.      INR today: Therapeutic  PLAN Weekly dose was unchanged.   Patient Instructions  Patient instructed to take medications as defined in the Anti-coagulation Track section of this encounter.  Patient instructed to take today's dose.  Patient instructed to take one-and-one-half (1 & 1/2) of your 44mblue colored warfarin tablets on  Mondays, Wednesdays and Fridays. All OTHER days of the week, only one (1) tablet.  Patient verbalized understanding of these instructions.   Patient advised to contact clinic or seek medical attention if signs/symptoms of bleeding or thromboembolism occur.  Patient verbalized understanding by repeating back information and was advised to contact me if further medication-related questions arise. Patient was also provided an information handout.  Follow-up Return in 4 weeks (on 10/02/2021) for Follow up INR.  [name] PharmD, CPP  15 minutes spent face-to-face with the patient during the encounter. 50% of time spent on education, including signs/sx bleeding and clotting, as well as food and drug interactions with warfarin. 50% of time was spent on fingerprick POC INR sample collection,processing, results determination, and documentation in [email or web address] flu vaccine administered at this visit.

## 2021-09-18 ENCOUNTER — Encounter (HOSPITAL_COMMUNITY): Payer: Medicare Other | Admitting: Cardiology

## 2021-09-26 IMAGING — MR MR LUMBAR SPINE W/O CM
4 of 5 series · 19 of 48 positions shown · non-contrast
Comparison: None.

CLINICAL DATA: Low back pain. History of thoracic compression
fracture. Sarcoidosis.

EXAM:
MRI LUMBAR SPINE WITHOUT CONTRAST
TECHNIQUE: Multiplanar, multisequence MR imaging of the lumbar spine was
performed. No intravenous contrast was administered.

[Series 2: T2 · sagittal · 4.0mm · 0.55mm/px · 6 of 15 slices shown (1 of 2)]
[im 1/15]
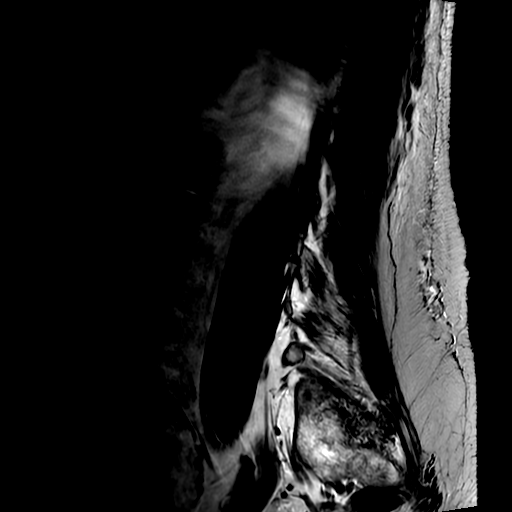
[im 3/15]
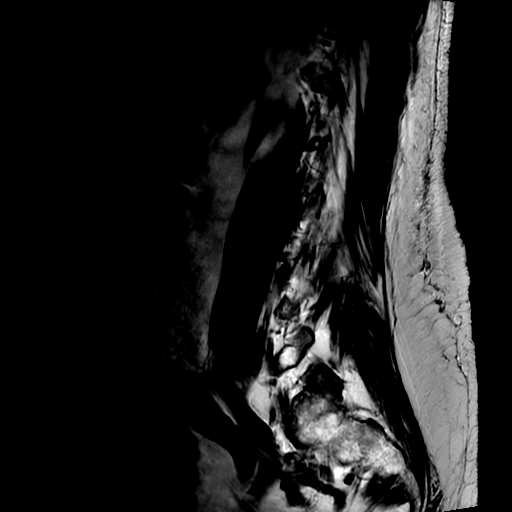
[im 6/15]
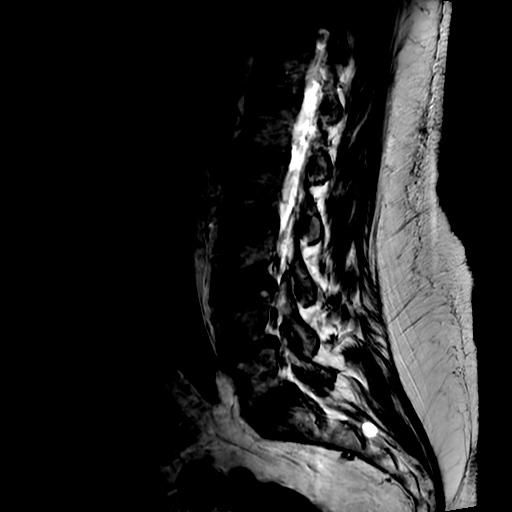
[im 9/15]
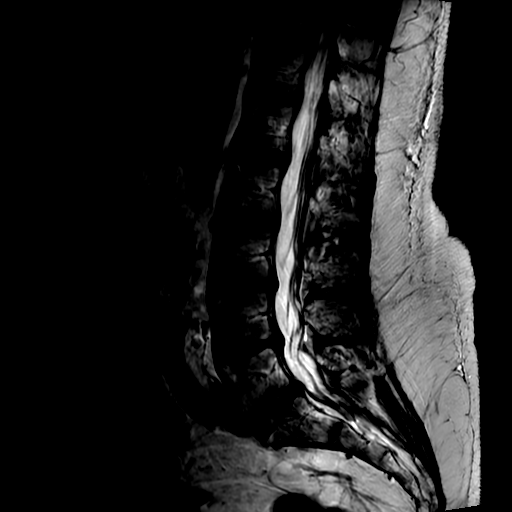
[im 12/15]
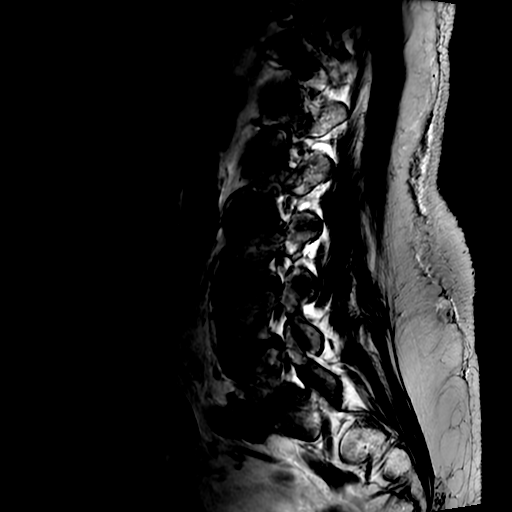
[im 15/15]
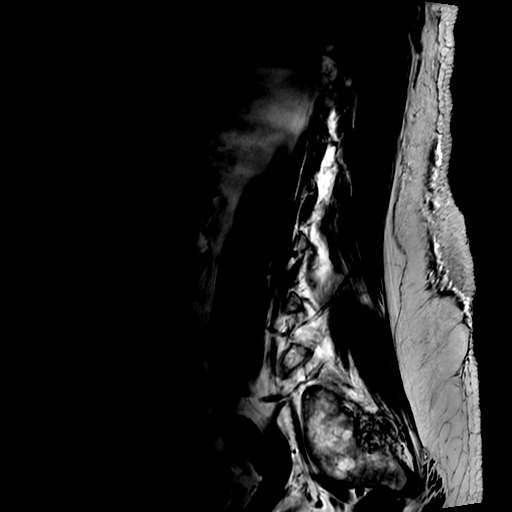

[Series 4: T1 · sagittal · 4.0mm · 0.51mm/px · 3 of 15 slices shown (1 of 2)]
[im 3/15]
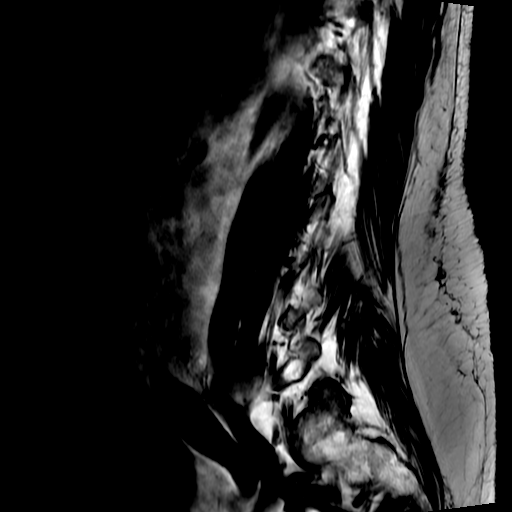
[im 9/15]
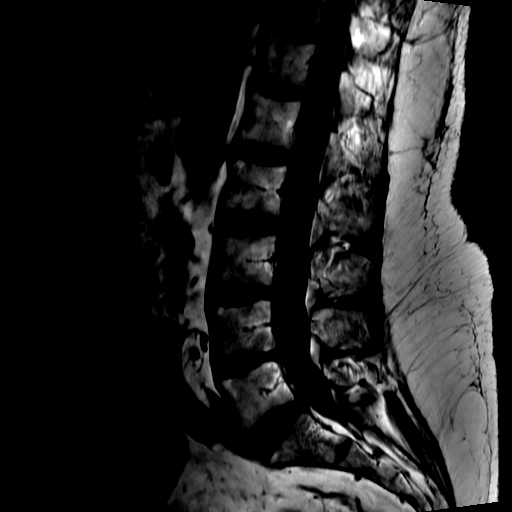
[im 15/15]
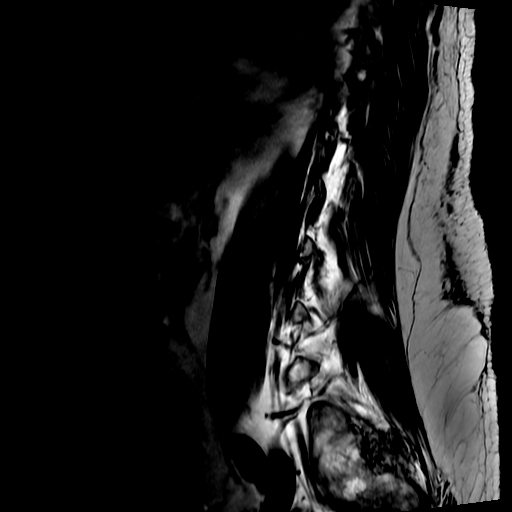

[Series 5: T2 · axial · 4.0mm · 0.39mm/px · z∈[-170,-1]mm · 7 of 35 slices shown (2 of 2)]
[im 1/35]
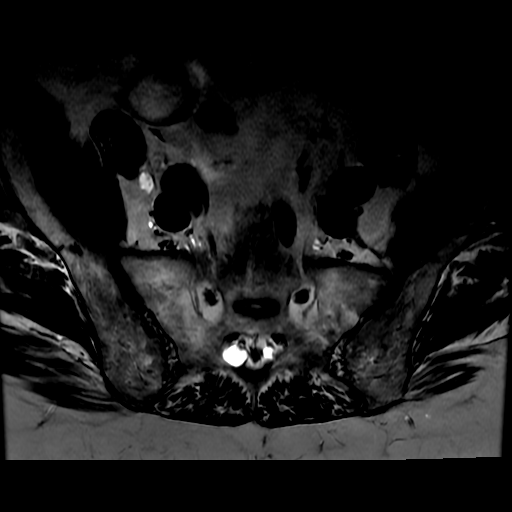
[im 5/35]
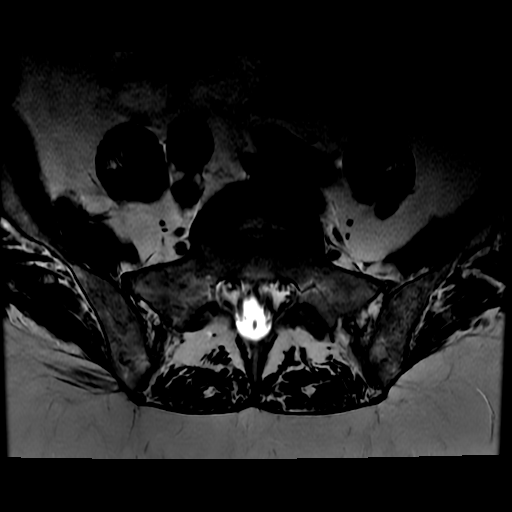
[im 10/35]
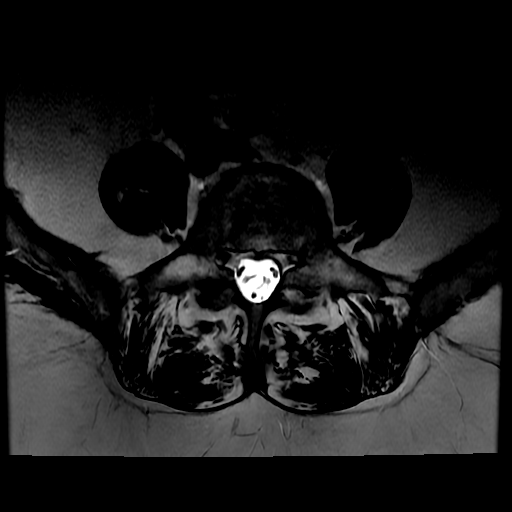
[im 15/35]
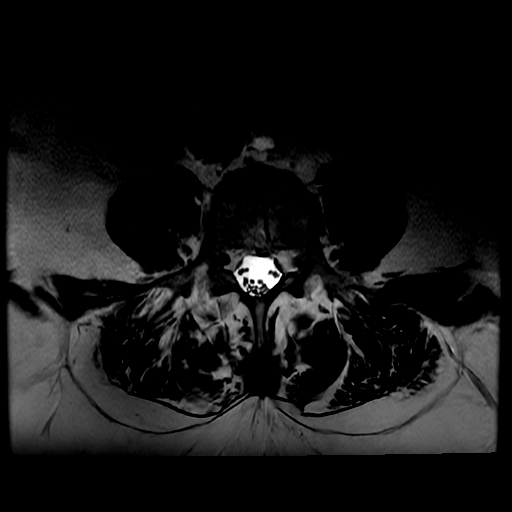
[im 18/35]
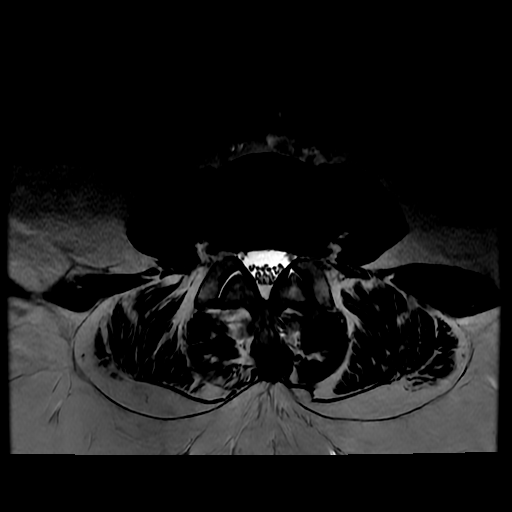
[im 20/35]
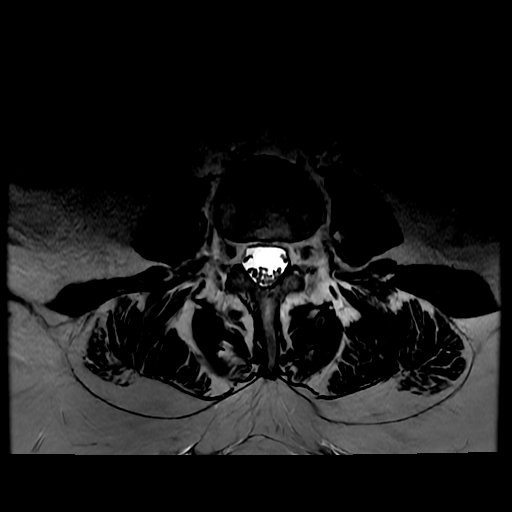
[im 30/35]
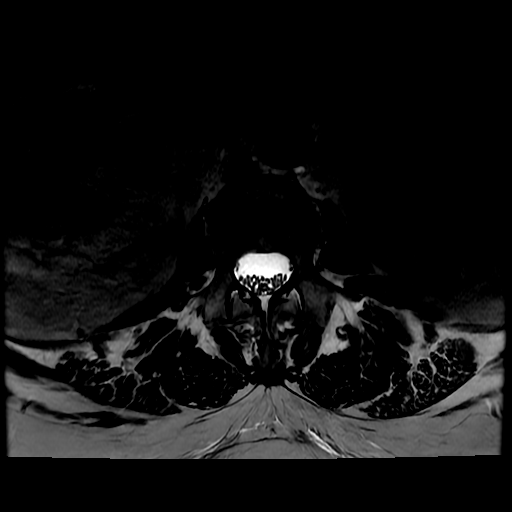

[Series 6: T1 · axial · 4.0mm · 0.39mm/px · z∈[-151,-1]mm · 3 of 35 slices shown (2 of 2)]
[im 5/35]
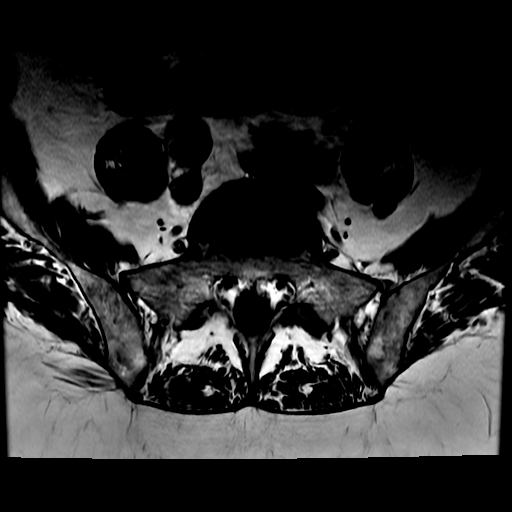
[im 18/35]
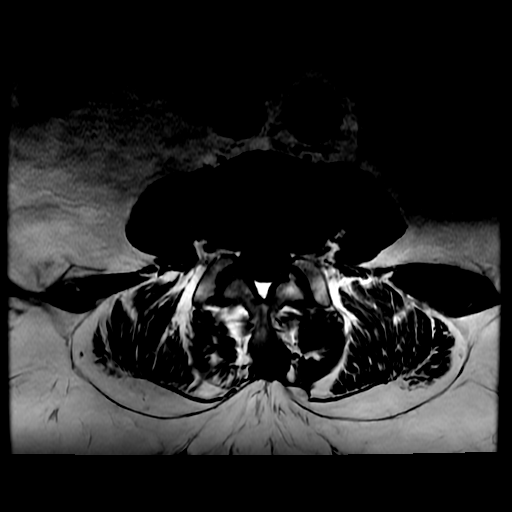
[im 30/35]
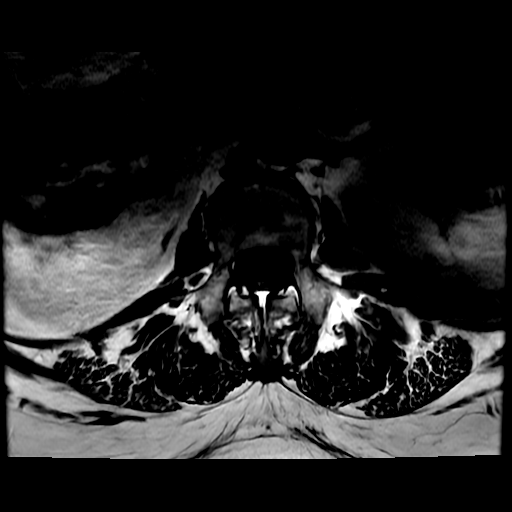

[19 of 48 positions shown; findings below may reference images not displayed]

FINDINGS: Segmentation:  Standard.

Alignment:  Physiologic.

Vertebrae:  No fracture, evidence of discitis, or bone lesion.

Conus medullaris and cauda equina: Conus extends to the L2 level.
Conus and cauda equina appear normal.

Paraspinal and other soft tissues: Negative.

Disc levels:

T12-L1: Normal disc space and facet joints. There is no spinal canal
stenosis. No neural foraminal stenosis.

L1-L2: Normal disc space and facet joints. There is no spinal canal
stenosis. No neural foraminal stenosis.

L2-L3: Normal disc space and facet joints. There is no spinal canal
stenosis. No neural foraminal stenosis.

L3-L4: Disc desiccation with small bulge. Normal facets. There is no
spinal canal stenosis. Mild left neural foraminal stenosis.

L4-L5: Small circumferential disc bulge. Normal facets. There is no
spinal canal stenosis. Mild right neural foraminal stenosis.

L5-S1: Normal disc space and facet joints. There is no spinal canal
stenosis. No neural foraminal stenosis.

Visualized sacrum: Normal.
IMPRESSION: 1. No acute abnormality of the lumbar spine.
2. Mild lower lumbar degenerative disc disease without spinal canal
stenosis.
3. Mild left L3-L4 and right L4-L5 neural foraminal stenosis.

## 2021-09-27 NOTE — Progress Notes (Signed)
[facility] Note  09/29/2021     CHIEF COMPLAINT Patient presents for Retina Follow Up   HISTORY OF PRESENT ILLNESS: Jesus Russell is a 70 y.o. male who presents to the clinic today for:  HPI     Retina Follow Up   Patient presents with  Other.  In both eyes.  This started 4 months ago.  I, the attending physician,  performed the HPI with the patient and updated documentation appropriately.        Comments   Patient here for 4 months retina follow up for ret heme OU. Patient states vision doing better. Glasses not strong enough. Can see better without glasses. No eye pain.       Last edited by Bernarda Caffey, MD on 10/01/2021  5:02 PM.     Referring physician: Monna Fam, MD [place],  [place] [address]  HISTORICAL INFORMATION:  Selected notes from the MEDICAL RECORD NUMBER Referred by Dr. [name] for ret eval   CURRENT MEDICATIONS: No current outpatient medications on file. (Ophthalmic Drugs)   No current facility-administered medications for this visit. (Ophthalmic Drugs)   Current Outpatient Medications (Other)  Medication Sig   albuterol (VENTOLIN HFA) 108 (90 Base) MCG/ACT inhaler Inhale 2 puffs into the lungs every 6 (six) hours as needed for wheezing or shortness of breath.   alendronate (FOSAMAX) 70 MG tablet Take 1 tablet (70 mg total) by mouth once a week.   Ascorbic Acid (VITAMIN C) 500 MG CAPS Take 500 mg by mouth daily.   cholecalciferol (VITAMIN D3) 25 MCG (1000 UT) tablet Take 1,000 Units by mouth daily.   Fluticasone-Umeclidin-Vilant (TRELEGY ELLIPTA) 200-62.5-25 MCG/INH AEPB Inhale 1 puff into the lungs daily.   macitentan (OPSUMIT) 10 MG tablet Take 1 tablet (10 mg total) by mouth daily.   omega-3 acid ethyl esters (LOVAZA) 1 g capsule Take 1 capsule (1 g total) by mouth 2 (two) times daily.   potassium chloride (KLOR-CON) 20 MEQ packet Take 60 mEq by mouth daily.   Riociguat (ADEMPAS) 2.5 MG TABS  Take 2.5 mg by mouth in the morning, at noon, and at bedtime.   Selexipag 1000 MCG TABS Take 1,000 mcg by mouth in the morning and at bedtime.   torsemide (DEMADEX) 20 MG tablet Take 4 tablets (80 mg total) by mouth every morning AND 2 tablets (40 mg total) every evening.   warfarin (COUMADIN) 4 MG tablet TAKE 1 TABLET BY MOUTH ON MONDAYS, WEDNESDAYS, FRIDAYS AND SATURDAYS. ON SUNDAYS, TUESDAYS AND THURSDAYS, TAKE 1 AND 1/2 TABLETS.   zinc gluconate 50 MG tablet Take 50 mg by mouth daily.   No current facility-administered medications for this visit. (Other)   REVIEW OF SYSTEMS: ROS   Positive for: Musculoskeletal, Cardiovascular, Eyes, Respiratory Negative for: Constitutional, Gastrointestinal, Neurological, Skin, Genitourinary, HENT, Endocrine, Psychiatric, Allergic/Imm, Heme/Lymph Last edited by Theodore Demark, COA on 09/29/2021  1:52 PM.     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    Past Surgical History:  Procedure Laterality Date   BRAIN SURGERY     HERNIA REPAIR     IR KYPHO EA ADDL LEVEL THORACIC OR LUMBAR  05/04/2020   IR KYPHO EA ADDL LEVEL THORACIC OR LUMBAR  05/04/2020   IR KYPHO THORACIC WITH BONE BIOPSY  05/04/2020   IR RADIOLOGIST EVAL & MGMT  04/07/2020   IR RADIOLOGIST  EVAL & MGMT  06/08/2020   NASAL ENDOSCOPY WITH EPISTAXIS CONTROL N/A 02/24/2020   Procedure: NASAL ENDOSCOPY WITH EPISTAXIS CONTROL;  Surgeon: [name], MD;  Location: [facility];  Service: ENT;  Laterality: N/A;   RIGHT HEART CATH N/A 07/06/2020   Procedure: RIGHT HEART CATH;  Surgeon: [name], MD;  Location: [facility];  Service: Cardiovascular;  Laterality: N/A;    FAMILY HISTORY Family History  Adopted: Yes    SOCIAL HISTORY Social History   Tobacco Use   Smoking status: Former    Packs/day: 1.50    Years: 23.00    Pack years: 34.50    Types: Cigarettes    Quit date: 1995    Years since quitting: 27.7   Smokeless tobacco: Never  Vaping Use   Vaping Use: Never used  Substance Use Topics   Alcohol use: Never   Drug use: Not Currently    Types: "Crack" cocaine    Comment: quit 2008       OPHTHALMIC EXAM:  Base Eye Exam     Visual Acuity (Snellen - Linear)       Right Left   Dist [place] 20/20 20/40 -1   Dist ph [place]  20/20 -1         Tonometry (Tonopen, 1:49 PM)       Right Left   Pressure 14 13         Pupils       Dark Light Shape React APD   Right 3 2 Round Brisk None   Left 3 2 Round Brisk None         Visual Fields (Counting fingers)       Left Right    Full Full         Extraocular Movement       Right Left    Full Full         Neuro/Psych     Oriented x3: Yes   Mood/Affect: Normal         Dilation     Both eyes: 1.0% Mydriacyl, 2.5% Phenylephrine @ 1:49 PM           Slit Lamp and Fundus Exam     Slit Lamp Exam       Right Left   Lids/Lashes Dermatochalasis - upper lid Dermatochalasis - upper lid, mild Ptosis   Conjunctiva/Sclera mild melanosis mild melanosis   Cornea arcus, trace PEE, well healed cataract wound arcus, trace PEE, well healed cataract wounds, mild tear film debris   Anterior Chamber Deep and quiet Deep and quiet   Iris Round and dilated Round and dilated   Lens PC IOL in good  position PC IOL in good position, 1+ PCO   Vitreous Vitreous syneresis, Posterior vitreous detachment, vitreous condensations Vitreous syneresis, Posterior  vitreous detachment, Weiss ring         Fundus Exam       Right Left   Disc Mild pallor, Sharp, Compact mild Pallor, Sharp rim, focal temporal PPP, trace fibrosis superiorly   C/D Ratio 0.2 0.2   Macula Flat, good foveal reflex, CWS/IRH IN mac -- resolved Flat, Good foveal reflex, mild RPE mottling and clumping, No heme or edema   Vessels attenuated, Copper wiring, AV crossing changes attenuated, mild tortuousity, mild copper wiring   Periphery Attached, focal SRH and CWS superior and nasal to disc -- resolved Attached            IMAGING AND PROCEDURES  Imaging and Procedures for 09/29/2021  OCT, Retina - OU - Both Eyes       Right Eye Quality was good. Central Foveal Thickness: 235. Progression has been stable. Findings include normal foveal contour, no IRF, no SRF.   Left Eye Quality was good. Central Foveal Thickness: 240. Progression has been stable. Findings include normal foveal contour, no IRF, no SRF.   Notes *Images captured and stored on drive  Diagnosis / Impression:  NFP; no IRF/SRF OU  Clinical management:  See below  Abbreviations: NFP - Normal foveal profile. CME - cystoid macular edema. PED - pigment epithelial detachment. IRF - intraretinal fluid. SRF - subretinal fluid. EZ - ellipsoid zone. ERM - epiretinal membrane. ORA - outer retinal atrophy. ORT - outer retinal tubulation. SRHM - subretinal hyper-reflective material. IRHM - intraretinal hyper-reflective material            ASSESSMENT/PLAN:    ICD-10-CM   1. Retinal hemorrhage, bilateral  H35.63     2. Retinal edema  H35.81 OCT, Retina - OU - Both Eyes    3. Essential hypertension  I10     4. Hypertensive retinopathy of both eyes  H35.033     5. Pseudophakia, both eyes  Z96.1     1,2. Peripapillary retinal hemorrhage OU (OD>OS) --  resolved  - OD with peripapillary blot hemes and CWS -- rresolved  - OS w/ mild focal peripapillary blot heme nasal to disc -- resolved  - no macular edema  - FA 4.26.22 shows mild, late peripapillary leakage inferior to disc OD, OS normal study  - broad differential but appearance suggestive of hypertensive retinopathy  - pt is also on coumadin for h/o DVT and PE -- likely contributing to hemorrhages  - discussed findings, prognosis  - no treatment indicated or recommended at this time  - pt is cleared from a retina standpoint for release to Dr. [name] and resumption of primary eye care  3,4. Hypertensive retinopathy OU  - BP well controlled -- 103/66 in office, 04.26.22 - discussed importance of tight BP control and possible contribution to retinal hemes - monitor   5. Pseudophakia OU  - s/p CE/IOL OS (Dr. [name])  - IOLs in good position, doing well  - monitor   Ophthalmic Meds Ordered this visit:  No orders of the defined types were placed in this encounter.     Return if symptoms worsen or fail to improve.  There are no Patient Instructions on file for this visit.  Explained the diagnoses, plan, and follow up with the patient and they expressed understanding.  Patient expressed understanding of the importance of proper follow up care.   This document serves as a record of services personally performed by Gardiner Sleeper, MD, PhD. It was created on their behalf by Leonie Douglas, an ophthalmic  technician. The creation of this record is the provider's dictation and/or activities during the visit.    Electronically signed by: Leonie Douglas COA, 10/01/21  5:03 PM  This document serves as a record of services personally performed by Gardiner Sleeper, MD, PhD. It was created on their behalf by Estill Bakes, COT an ophthalmic technician. The creation of this record is the provider's dictation and/or activities during the visit.    Electronically signed by: Estill Bakes, COT 10.7.22  @ 5:03 PM   Gardiner Sleeper, M.D., Ph.D. Diseases & Surgery of the Retina and [place] 09/29/2021  I have reviewed the above documentation for accuracy and completeness, and I agree with the above. Gardiner Sleeper, M.D., Ph.D. 10/01/21 5:04 PM   Abbreviations: M myopia (nearsighted); A astigmatism; H hyperopia (farsighted); P presbyopia; Mrx spectacle prescription;  CTL contact lenses; OD right eye; OS left eye; OU both eyes  XT exotropia; ET esotropia; PEK punctate epithelial keratitis; PEE punctate epithelial erosions; DES dry eye syndrome; MGD meibomian gland dysfunction; ATs artificial tears; PFAT's preservative free artificial tears; Elim nuclear sclerotic cataract; PSC posterior subcapsular cataract; ERM epi-retinal membrane; PVD posterior vitreous detachment; RD retinal detachment; DM diabetes mellitus; DR diabetic retinopathy; NPDR non-proliferative diabetic retinopathy; PDR proliferative diabetic retinopathy; CSME clinically significant macular edema; DME diabetic macular edema; dbh dot blot hemorrhages; CWS cotton wool spot; POAG primary open angle glaucoma; C/D cup-to-disc ratio; HVF humphrey visual field; GVF goldmann visual field; OCT optical coherence tomography; IOP intraocular pressure; BRVO Branch retinal vein occlusion; CRVO central retinal vein occlusion; CRAO central retinal artery occlusion; BRAO branch retinal artery occlusion; RT retinal tear; SB scleral buckle; PPV pars plana vitrectomy; VH Vitreous hemorrhage; PRP panretinal laser photocoagulation; IVK intravitreal kenalog; VMT vitreomacular traction; MH Macular hole;  NVD neovascularization of the disc; NVE neovascularization elsewhere; AREDS age related eye disease study; ARMD age related macular degeneration; POAG primary open angle glaucoma; EBMD epithelial/anterior basement membrane dystrophy; ACIOL anterior chamber intraocular lens; IOL intraocular lens; PCIOL posterior chamber intraocular  lens; Phaco/IOL phacoemulsification with intraocular lens placement; [place] photorefractive keratectomy; LASIK laser assisted in situ keratomileusis; HTN hypertension; DM diabetes mellitus; COPD chronic obstructive pulmonary disease

## 2021-09-29 ENCOUNTER — Other Ambulatory Visit: Payer: Self-pay

## 2021-09-29 ENCOUNTER — Ambulatory Visit (INDEPENDENT_AMBULATORY_CARE_PROVIDER_SITE_OTHER): Payer: Medicare Other | Admitting: Ophthalmology

## 2021-09-29 ENCOUNTER — Encounter (INDEPENDENT_AMBULATORY_CARE_PROVIDER_SITE_OTHER): Payer: Self-pay | Admitting: Ophthalmology

## 2021-09-29 DIAGNOSIS — H3563 Retinal hemorrhage, bilateral: Secondary | ICD-10-CM

## 2021-09-29 DIAGNOSIS — I10 Essential (primary) hypertension: Secondary | ICD-10-CM

## 2021-09-29 DIAGNOSIS — H35033 Hypertensive retinopathy, bilateral: Secondary | ICD-10-CM

## 2021-09-29 DIAGNOSIS — H3581 Retinal edema: Secondary | ICD-10-CM

## 2021-09-29 DIAGNOSIS — Z961 Presence of intraocular lens: Secondary | ICD-10-CM

## 2021-10-01 ENCOUNTER — Encounter (INDEPENDENT_AMBULATORY_CARE_PROVIDER_SITE_OTHER): Payer: Self-pay | Admitting: Ophthalmology

## 2021-10-02 ENCOUNTER — Ambulatory Visit (INDEPENDENT_AMBULATORY_CARE_PROVIDER_SITE_OTHER): Payer: Medicare Other | Admitting: Pharmacist

## 2021-10-02 DIAGNOSIS — Z86718 Personal history of other venous thrombosis and embolism: Secondary | ICD-10-CM

## 2021-10-02 DIAGNOSIS — I2699 Other pulmonary embolism without acute cor pulmonale: Secondary | ICD-10-CM | POA: Diagnosis not present

## 2021-10-02 DIAGNOSIS — Z7901 Long term (current) use of anticoagulants: Secondary | ICD-10-CM

## 2021-10-02 DIAGNOSIS — Z86711 Personal history of pulmonary embolism: Secondary | ICD-10-CM

## 2021-10-02 LAB — POCT INR: INR: 2 (ref 2.0–3.0)

## 2021-10-02 NOTE — Patient Instructions (Signed)
Patient instructed to take medications as defined in the Anti-coagulation Track section of this encounter.  Patient instructed to take today's dose.  Patient instructed to take one-and-one-half (1 & 1/2) of your 51m blue colored warfarin tablets on  Mondays, Tuesdays, Wednesdays, Thursdays and Fridays. On Saturdays and Sundays, take only one (1) tablet.   Patient verbalized understanding of these instructions.

## 2021-10-02 NOTE — Progress Notes (Signed)
Anticoagulation Management Jesus Russell is a 70 y.o. male who reports to the clinic for monitoring of warfarin treatment.    Indication: DVT and PE , History of; Long term current use of oral anticoagulant warfarin to maintain INR 2.0 - 3.0.  Duration: indefinite Supervising physician: Aldine Contes  Anticoagulation Clinic Visit History: Patient does not report signs/symptoms of bleeding or thromboembolism  Other recent changes: No diet, medications, lifestyle changes endorsed by the patient at this visit. Anticoagulation Episode Summary     Current INR goal:  2.0-3.0  TTR:  69.9 % (1.9 y)  Next INR check:  10/30/2021  INR from last check:  2.0 (10/02/2021)  Weekly max warfarin dose:    Target end date:  Indefinite  INR check location:    Preferred lab:    Send INR reminders to:     Indications   Long term (current) use of anticoagulants [Z79.01] History of DVT (deep vein thrombosis) [Z86.718] Pulmonary embolism on long-term anticoagulation therapy (HCC) [I26.99 Z79.01] History of pulmonary embolism [Z86.711]        Comments:           No Known Allergies  Current Outpatient Medications:    albuterol (VENTOLIN HFA) 108 (90 Base) MCG/ACT inhaler, Inhale 2 puffs into the lungs every 6 (six) hours as needed for wheezing or shortness of breath., Disp: 18 g, Rfl: 5   alendronate (FOSAMAX) 70 MG tablet, Take 1 tablet (70 mg total) by mouth once a week., Disp: 4 tablet, Rfl: 5   Ascorbic Acid (VITAMIN C) 500 MG CAPS, Take 500 mg by mouth daily., Disp: , Rfl:    cholecalciferol (VITAMIN D3) 25 MCG (1000 UT) tablet, Take 1,000 Units by mouth daily., Disp: , Rfl:    Fluticasone-Umeclidin-Vilant (TRELEGY ELLIPTA) 200-62.5-25 MCG/INH AEPB, Inhale 1 puff into the lungs daily., Disp: 60 each, Rfl: 5   macitentan (OPSUMIT) 10 MG tablet, Take 1 tablet (10 mg total) by mouth daily., Disp: 30 tablet, Rfl:    omega-3 acid ethyl esters (LOVAZA) 1 g capsule, Take 1 capsule (1 g total) by  mouth 2 (two) times daily., Disp: 180 capsule, Rfl: 1   potassium chloride (KLOR-CON) 20 MEQ packet, Take 60 mEq by mouth daily., Disp: 90 packet, Rfl: 11   Riociguat (ADEMPAS) 2.5 MG TABS, Take 2.5 mg by mouth in the morning, at noon, and at bedtime., Disp: 90 tablet, Rfl: 11   Selexipag 1000 MCG TABS, Take 1,000 mcg by mouth in the morning and at bedtime., Disp: 60 tablet, Rfl: 6   torsemide (DEMADEX) 20 MG tablet, Take 4 tablets (80 mg total) by mouth every morning AND 2 tablets (40 mg total) every evening., Disp: 540 tablet, Rfl: 3   warfarin (COUMADIN) 4 MG tablet, TAKE 1 TABLET BY MOUTH ON MONDAYS, WEDNESDAYS, FRIDAYS AND SATURDAYS. 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    Social History   Socioeconomic History   Marital status: Divorced    Spouse name: Not on file   Number of children: Not on file   Years of education: Not on file   Highest education level: Not on file  Occupational History   Occupation: retired Pharmacist, hospital  Tobacco Use   Smoking status: Former    Packs/day: 1.50    Years: 23.00    Pack years: 34.50    Types: Cigarettes    Quit date: 1995    Years since  quitting: 27.7   Smokeless tobacco: Never  Vaping Use   Vaping Use: Never used  Substance and Sexual Activity   Alcohol use: Never   Drug use: Not Currently    Types: "Crack" cocaine    Comment: quit 2008   Sexual activity: Not on file  Other Topics Concern   Not on file  Social History Narrative   Not on file   Social Determinants of Health   Financial Resource Strain: Not on file  Food Insecurity: Not on file  Transportation Needs: Not on file  Physical Activity: Not on file  Stress: Not on file  Social Connections: Not on file   Family History  Adopted: Yes    ASSESSMENT Recent Results: The most recent result is correlated with 34 mg per week: Lab Results  Component Value Date   INR 2.0 10/02/2021   INR 2.3 09/04/2021   INR 2.1 08/07/2021    Anticoagulation Dosing: Description   Take one-and-one-half (1 & 1/2) of your 52m blue colored warfarin tablets on  Mondays, Tuesdays, Wednesdays, Thursdays and Fridays. On Saturdays and Sundays, take only one (1) tablet.       INR today: Therapeutic  PLAN Weekly dose was increased by 12% to 38 mg per week  Patient Instructions  Patient instructed to take medications as defined in the Anti-coagulation Track section of this encounter.  Patient instructed to take today's dose.  Patient instructed to take one-and-one-half (1 & 1/2) of your 453mblue colored warfarin tablets on  Mondays, Tuesdays, Wednesdays, Thursdays and Fridays. On Saturdays and Sundays, take only one (1) tablet.   Patient verbalized understanding of these instructions.   Patient advised to contact clinic or seek medical attention if signs/symptoms of bleeding or thromboembolism occur.  Patient verbalized understanding by repeating back information and was advised to contact me if further medication-related questions arise. Patient was also provided an information handout.  Follow-up Return in 4 weeks (on 10/30/2021) for Follow up INR.  [name] PharmD, CPP  15 minutes spent face-to-face with the patient during the encounter. 50% of time spent on education, including signs/sx bleeding and clotting, as well as food and drug interactions with warfarin. 50% of time was spent on fingerprick POC INR sample collection,processing, results determination, and documentation in [email or web address]

## 2021-10-02 NOTE — Progress Notes (Signed)
INTERNAL MEDICINE TEACHING ATTENDING ADDENDUM - Aldine Contes M.D  Duration- indefinite, Indication- recurrent VTE, INR- therapeutic. Agree with pharmacy recommendations as outlined in their note.

## 2021-10-05 ENCOUNTER — Other Ambulatory Visit: Payer: Self-pay | Admitting: Internal Medicine

## 2021-10-05 DIAGNOSIS — J449 Chronic obstructive pulmonary disease, unspecified: Secondary | ICD-10-CM

## 2021-10-20 ENCOUNTER — Other Ambulatory Visit: Payer: Self-pay

## 2021-10-20 ENCOUNTER — Encounter (HOSPITAL_COMMUNITY): Payer: Self-pay | Admitting: Cardiology

## 2021-10-20 ENCOUNTER — Ambulatory Visit (HOSPITAL_COMMUNITY)
Admission: RE | Admit: 2021-10-20 | Discharge: 2021-10-20 | Disposition: A | Discharge status: Home / Self Care | Payer: Medicare Other | Source: Ambulatory Visit | Attending: Cardiology | Admitting: Cardiology

## 2021-10-20 VITALS — BP 110/72 | HR 76 | Wt 188.8 lb

## 2021-10-20 DIAGNOSIS — I50810 Right heart failure, unspecified: Secondary | ICD-10-CM

## 2021-10-20 DIAGNOSIS — I50812 Chronic right heart failure: Secondary | ICD-10-CM | POA: Insufficient documentation

## 2021-10-20 DIAGNOSIS — M2550 Pain in unspecified joint: Secondary | ICD-10-CM | POA: Diagnosis not present

## 2021-10-20 DIAGNOSIS — Z8616 Personal history of COVID-19: Secondary | ICD-10-CM | POA: Diagnosis not present

## 2021-10-20 DIAGNOSIS — G4733 Obstructive sleep apnea (adult) (pediatric): Secondary | ICD-10-CM | POA: Insufficient documentation

## 2021-10-20 DIAGNOSIS — Z86711 Personal history of pulmonary embolism: Secondary | ICD-10-CM | POA: Insufficient documentation

## 2021-10-20 DIAGNOSIS — D869 Sarcoidosis, unspecified: Secondary | ICD-10-CM

## 2021-10-20 DIAGNOSIS — Z86718 Personal history of other venous thrombosis and embolism: Secondary | ICD-10-CM | POA: Diagnosis not present

## 2021-10-20 DIAGNOSIS — D6862 Lupus anticoagulant syndrome: Secondary | ICD-10-CM | POA: Diagnosis not present

## 2021-10-20 DIAGNOSIS — Z7901 Long term (current) use of anticoagulants: Secondary | ICD-10-CM | POA: Insufficient documentation

## 2021-10-20 DIAGNOSIS — I272 Pulmonary hypertension, unspecified: Secondary | ICD-10-CM | POA: Insufficient documentation

## 2021-10-20 DIAGNOSIS — I10 Essential (primary) hypertension: Secondary | ICD-10-CM | POA: Insufficient documentation

## 2021-10-20 LAB — BASIC METABOLIC PANEL
Anion gap: 8 (ref 5–15)
BUN: 17 mg/dL (ref 8–23)
CO2: 35 mmol/L — ABNORMAL HIGH (ref 22–32)
Calcium: 9.2 mg/dL (ref 8.9–10.3)
Chloride: 94 mmol/L — ABNORMAL LOW (ref 98–111)
Creatinine, Ser: 1.25 mg/dL — ABNORMAL HIGH (ref 0.61–1.24)
GFR, Estimated: >60 mL/min (ref >60)
Glucose, Bld: 84 mg/dL (ref 70–99)
Potassium: 4 mmol/L (ref 3.5–5.1)
Sodium: 137 mmol/L (ref 135–145)

## 2021-10-20 LAB — BRAIN NATRIURETIC PEPTIDE: B Natriuretic Peptide: 48 pg/mL (ref 0.0–100.0)

## 2021-10-20 MED ORDER — UPTRAVI 1000 MCG PO TABS: 600.0000 ug | ORAL_TABLET | Freq: Two times a day (BID) | ORAL | Status: DC

## 2021-10-20 NOTE — Patient Instructions (Signed)
Increase Selexipeg to 600 mcg Twice daily   Labs done today, your results will be available in MyChart, we will contact you for abnormal readings.  Your physician recommends that you schedule a follow-up appointment in: 3 months  If you have any questions or concerns before your next appointment please send [place] a message through [place] or call our office at [phone].    TO LEAVE A MESSAGE FOR THE NURSE SELECT OPTION 2, PLEASE LEAVE A MESSAGE INCLUDING: YOUR NAME DATE OF BIRTH CALL BACK NUMBER REASON FOR CALL**this is important as we prioritize the call backs  YOU WILL RECEIVE A CALL BACK THE SAME DAY AS LONG AS YOU CALL BEFORE 4:00 PM  At the [facility], you and your health needs are our priority. As part of our continuing mission to provide you with exceptional heart care, we have created designated Provider Care Teams. These Care Teams include your primary Cardiologist (physician) and Advanced Practice Providers (APPs- Physician Assistants and Nurse Practitioners) who all work together to provide you with the care you need, when you need it.   You may see any of the following providers on your designated Care Team at your next follow up: Dr Glori Bickers Dr Haynes Kerns, NP Lyda Jester, [place] [facility] [place], [place] Audry Riles, PharmD   Please be sure to bring in all your medications bottles to every appointment.

## 2021-10-20 NOTE — Progress Notes (Signed)
6 minute walk test performed.  Patient ambulated 500 feet during 6 mins with 2 rest breaks.  He was SOB.  His pulse ox ranged from 99-96% on 3 L oxygen.  His HR ranged from 94 BPM to 144 BPM.

## 2021-10-21 NOTE — Progress Notes (Signed)
PCP: Madalyn Rob, MD HF Cardiology: Dr. Aundra Dubin  70 y.o. with history of sarcoidosis, prior PE, RV failure was referred by Dr. Shearon Stalls for evaluation of CHF/pulmonary hypertension.  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  Patient was on prednisone for a period of time, this was stopped earlier this year.  He is on 2 L home oxygen.  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 He has had recurrent PEs/DVTs.  He has an IVC filter.  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  Most recent PE was in 2/21 when he had been off warfarin transiently.  Last echo in 5/21 showed EF 55-60% with D-shaped septum and severely dilated/severely dysfunctional RV.  No estimation of PA pressure was available.    RHC in 7/21 showed normal PCWP and moderate PAH with PVR 5 WU. V/Q showed perfusion defects but thought to be due to sarcoidosis.  CTA chest in 7/21 showed no acute or chronic PE, signs of chronic sarcoidosis were present.   Echo (5/22) with EF 55-60%, moderate focal basal septal hypertrophy, severe RV enlargement with moderately decreased systolic function.   Patient has had to cut back on Uptravi due to joint pain and fatigue.  He is currently taking 500 mcg bid.  He stopped Opsumit due to severe peripheral edema. He tried restarting it, and the edema came back, so he stopped it for good.   Patient presents for followup of pulmonary hypertension/RV failure.  He is now using CPAP.  He uses 2 L home oxygen.  No chest pain.  He has stable exertional dyspnea with moderate activity.  He is able to walk with his walker.  No lightheadedness/syncope. Weight is stable.   ECG (personally reviewed): NSR, RBBB, LAFB  Labs (6/21): pro-BNP 43, K 4.6, creatinine 0.89 Labs (8/21): hgb 10.5, K 3.8, creatinine 1.28 Labs (10/21): K 5.4, creatinine 1.1 Labs (11/21): K 3.3, creatinine 1.14 Labs (1/22): BNP 365 Labs (2/22): K 4, creatinine 1.2 Labs  (3/22): K 3.9, creatinine 1.49, BNP 21 Labs (6/22): BNP 23, K 5, creatinine 1.33  6 minute walk (11/21): 76 m 6 minute walk (1/22): 183 m 6 minute walk (3/22): 198 m 6 minute walk (5/22): 183 m 6 minute walk (10/22): 26 m  PMH: 1. Sarcoidosis: "Burned out."  Diagnosed in 1990 with bronchoscopy/biopsy.  Treated with prednisone, now off.  - On 2 L home oxygen chronically.  2. 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 H/o DVT and PE.  Has lupus anticoagulant.   - Most recent PE was in 2/21, he had been off warfarin transiently.  - V/Q scan in 7/21 with perfusion defects, CTA in 7/21 showed no acute or chronic thrombi, signs of sarcoidosis were present.  4. COVID-19 infection 1/21.  5. H/o compression fractures in 2/21.  - Had kyphoplasty 6. RV failure/pulmonary hypertension:  - Echo (5/21) with EF 55-60%, D-shaped interventricular septum, severely dilated RV with severe systolic dysfunction, dilated IVC.  - RHC (7/21) with mean RA 9, PA 62/21 mean 39, mean PCWP 11, CI 2.64, PVR 5 WU.  - Did not tolerate Tyvaso.  - Echo (5/22): EF 55-60%, moderate focal basal septal hypertrophy, severe RV enlargement with moderately decreased systolic function.  - Unable to tolerate Opsumit.  7. HTN 8. History of subdural hematoma remotely.   Social History   Socioeconomic History   Marital status: Divorced    Spouse name: Not on file   Number of children: Not on file  Years of education: Not on file   Highest education level: Not on file  Occupational History   Occupation: retired Pharmacist, hospital  Tobacco Use   Smoking status: Former    Packs/day: 1.50    Years: 23.00    Pack years: 34.50    Types: Cigarettes    Quit date: 1995    Years since quitting: 27.8   Smokeless tobacco: Never  Vaping Use   Vaping Use: Never used  Substance and Sexual Activity   Alcohol use: Never   Drug use: Not Currently    Types: "Crack" cocaine    Comment: quit 2008   Sexual activity: Not on file   Other Topics Concern   Not on file  Social History Narrative   Not on file   Social Determinants of Health   Financial Resource Strain: Not on file  Food Insecurity: Not on file  Transportation Needs: Not on file  Physical Activity: Not on file  Stress: Not on file  Social Connections: Not on file  Intimate Partner Violence: Not on file   Family History  Adopted: Yes   ROS: All systems reviewed and negative except as per HPI.   Current Outpatient Medications  Medication Sig Dispense Refill   albuterol (VENTOLIN HFA) 108 (90 Base) MCG/ACT inhaler Inhale 2 puffs into the lungs every 6 (six) hours as needed for wheezing or shortness of breath. 18 g 5   alendronate (FOSAMAX) 70 MG tablet Take 1 tablet (70 mg total) by mouth once a week. 4 tablet 5   Ascorbic Acid (VITAMIN C) 500 MG CAPS Take 500 mg by mouth daily.     cholecalciferol (VITAMIN D3) 25 MCG (1000 UT) tablet Take 1,000 Units by mouth daily.     omega-3 acid ethyl esters (LOVAZA) 1 g capsule Take 1 capsule (1 g total) by mouth 2 (two) times daily. 180 capsule 1   potassium chloride (KLOR-CON) 20 MEQ packet Take 60 mEq by mouth daily. 90 packet 11   Riociguat (ADEMPAS) 2.5 MG TABS Take 2.5 mg by mouth in the morning, at noon, and at bedtime. 90 tablet 11   torsemide (DEMADEX) 20 MG tablet Take 4 tablets (80 mg total) by mouth every morning AND 2 tablets (40 mg total) every evening. 540 tablet 3   TRELEGY ELLIPTA 200-62.5-25 MCG/INH AEPB INHALE 1 PUFF INTO THE LUNGS DAILY 60 each 3   warfarin (COUMADIN) 4 MG tablet TAKE 1 TABLET BY MOUTH ON MONDAYS, WEDNESDAYS, FRIDAYS AND SATURDAYS. ON SUNDAYS, TUESDAYS AND THURSDAYS, TAKE 1 AND 1/2 TABLETS. 108 tablet 1   zinc gluconate 50 MG tablet Take 50 mg by mouth daily.     Selexipag (UPTRAVI) 1000 MCG TABS Take 600 mcg by mouth 2 (two) times daily. 60 tablet    No current facility-administered medications for this encounter.   BP 110/72   Pulse 76   Wt 85.6 kg (188 lb 12.8 oz)    SpO2 100% Comment: 2.5L of O2  BMI 31.42 kg/m  General: NAD Neck: No JVD, no thyromegaly or thyroid nodule.  Lungs: Dependent crackles at bases.  CV: Nondisplaced PMI.  Heart regular S1/S2, no S3/S4, no murmur.  No peripheral edema.  No carotid bruit.  Normal pedal pulses.  Abdomen: Soft, nontender, no hepatosplenomegaly, no distention.  Skin: Intact without lesions or rashes.  Neurologic: Alert and oriented x 3.  Psych: Normal affect. Extremities: No clubbing or cyanosis.  HEENT: Normal.   Assessment/Plan: 1. RV failure, pulmonary hypertension: Echo in 5/21 showed EF  55-60%, D-shaped septum with severe RV dilation/severe RV dysfunction.  Gray in 7/21 showed mildly elevated RA pressure, moderate PAH with PVR 5 WU.  V/Q scan showed perfusion defects, but CTA chest in 7/21 showed no acute or chronic PE, V/Q findings were likely due to chronic sarcoidosis changes in lungs.  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  Echo in 5/22 showed mildly improved RV function compared to 5/21. 6 minute walk today mildly lower.  He did not tolerate Tyvaso. He is now off Opsumit (cannot tolerate due to swelling).  He has cut back on Tyvaso due to joint pain.  He is not volume overloaded on exam.  NYHA class III symptoms chronically.  - Continue torsemide 80 qam/40 qpm.  BMET/BNP today.  - Off Opsumit, unable to tolerate.  - Continue Adempas 2.5 mg tid.   - I will try to go a little higher with selexipag, increase to 600 mcg bid. Probably will not be able to increase further due to joint pain side effects.  - Continue home oxygen and CPAP.  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  Has positive lupus anticoagulant.  - Continue warfarin, should have Lovenox bridge if stops warfarin (had PE when off warfarin transiently in 2/21).  3. Sarcoidosis: History of "burned out" pulmonary sarcoidosis.  This may be the cause of his pulmonary hypertension.  No definitive  evidence for cardiac sarcoidosis, but could arrange for cardiac MRI in the future to investigate. He is no longer on prednisone.  - Continue 2 L home oxygen.  4. HTN: BP controlled.  5. OSA: Use CPAP.   Followup 3 months.    Loralie Champagne 10/21/2021

## 2021-10-30 ENCOUNTER — Ambulatory Visit (INDEPENDENT_AMBULATORY_CARE_PROVIDER_SITE_OTHER): Payer: Medicare Other | Admitting: Pharmacist

## 2021-10-30 DIAGNOSIS — Z86718 Personal history of other venous thrombosis and embolism: Secondary | ICD-10-CM | POA: Diagnosis not present

## 2021-10-30 DIAGNOSIS — Z7901 Long term (current) use of anticoagulants: Secondary | ICD-10-CM

## 2021-10-30 DIAGNOSIS — Z86711 Personal history of pulmonary embolism: Secondary | ICD-10-CM | POA: Diagnosis not present

## 2021-10-30 DIAGNOSIS — I2699 Other pulmonary embolism without acute cor pulmonale: Secondary | ICD-10-CM

## 2021-10-30 LAB — POCT INR: INR: 2.1 (ref 2.0–3.0)

## 2021-10-30 NOTE — Patient Instructions (Signed)
Patient instructed to take medications as defined in the Anti-coagulation Track section of this encounter.  Patient instructed to take today's dose.  Patient instructed to take one-and-one-half (1 & 1/2) of your 50m blue colored warfarin tablets by mouth, once-daily, every day.   Patient verbalized understanding of these instructions.

## 2021-10-30 NOTE — Progress Notes (Signed)
Anticoagulation Management Jesus Russell is a 70 y.o. male who reports to the clinic for monitoring of warfarin treatment.    Indication: DVT and PE , long term current use of warfarin to maintain INR 2.0 - 3.0.  Duration: indefinite Supervising physician: Joni Reining  Anticoagulation Clinic Visit History: Patient does not report signs/symptoms of bleeding or thromboembolism  Other recent changes: No diet, medications, lifestyle changes.  Anticoagulation Episode Summary     Current INR goal:  2.0-3.0  TTR:  71.1 % (2 y)  Next INR check:  12/04/2021  INR from last check:  2.1 (10/30/2021)  Weekly max warfarin dose:    Target end date:  Indefinite  INR check location:    Preferred lab:    Send INR reminders to:     Indications   Long term (current) use of anticoagulants [Z79.01] History of DVT (deep vein thrombosis) [Z86.718] Pulmonary embolism on long-term anticoagulation therapy (HCC) [I26.99 Z79.01] History of pulmonary embolism [Z86.711]        Comments:           No Known Allergies  Current Outpatient Medications:    albuterol (VENTOLIN HFA) 108 (90 Base) MCG/ACT inhaler, Inhale 2 puffs into the lungs every 6 (six) hours as needed for wheezing or shortness of breath., Disp: 18 g, Rfl: 5   alendronate (FOSAMAX) 70 MG tablet, Take 1 tablet (70 mg total) by mouth once a week., Disp: 4 tablet, Rfl: 5   Ascorbic Acid (VITAMIN C) 500 MG CAPS, Take 500 mg by mouth daily., Disp: , Rfl:    cholecalciferol (VITAMIN D3) 25 MCG (1000 UT) tablet, Take 1,000 Units by mouth daily., Disp: , Rfl:    omega-3 acid ethyl esters (LOVAZA) 1 g capsule, Take 1 capsule (1 g total) by mouth 2 (two) times daily., Disp: 180 capsule, Rfl: 1   potassium chloride (KLOR-CON) 20 MEQ packet, Take 60 mEq by mouth daily., Disp: 90 packet, Rfl: 11   Riociguat (ADEMPAS) 2.5 MG TABS, Take 2.5 mg by mouth in the morning, at noon, and at bedtime., Disp: 90 tablet, Rfl: 11   Selexipag (UPTRAVI) 1000 MCG  TABS, Take 600 mcg by mouth 2 (two) times daily., Disp: 60 tablet, Rfl:    torsemide (DEMADEX) 20 MG tablet, Take 4 tablets (80 mg total) by mouth every morning AND 2 tablets (40 mg total) every evening., Disp: 540 tablet, Rfl: 3   TRELEGY ELLIPTA 200-62.5-25 MCG/INH AEPB, INHALE 1 PUFF INTO THE LUNGS DAILY, Disp: 60 each, Rfl: 3   warfarin (COUMADIN) 4 MG tablet, TAKE 1 TABLET BY MOUTH ON MONDAYS, WEDNESDAYS, FRIDAYS AND SATURDAYS. 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    Social History   Socioeconomic History   Marital status: Divorced    Spouse name: Not on file   Number of children: Not on file   Years of education: Not on file   Highest education level: Not on file  Occupational History   Occupation: retired Pharmacist, hospital  Tobacco Use   Smoking status: Former    Packs/day: 1.50    Years: 23.00    Pack years: 34.50    Types: Cigarettes    Quit date: 1995    Years since quitting: 27.8   Smokeless tobacco: Never  Vaping Use   Vaping Use: Never used  Substance and Sexual Activity   Alcohol use: Never   Drug use: Not Currently    Types: "Crack" cocaine    Comment: quit 2008    Sexual activity: Not on file  Other Topics Concern   Not on file  Social History Narrative   Not on file   Social Determinants of Health   Financial Resource Strain: Not on file  Food Insecurity: Not on file  Transportation Needs: Not on file  Physical Activity: Not on file  Stress: Not on file  Social Connections: Not on file   Family History  Adopted: Yes    ASSESSMENT Recent Results: The most recent result is correlated with 38 mg per week: Lab Results  Component Value Date   INR 2.1 10/30/2021   INR 2.0 10/02/2021   INR 2.3 09/04/2021    Anticoagulation Dosing: Description   Take one-and-one-half (1 & 1/2) of your 40m blue colored warfarin tablets by mouth, once-daily.        INR today: Therapeutic  PLAN Weekly dose was increased by 10% to 42 mg per week  Patient Instructions  Patient instructed to take medications as defined in the Anti-coagulation Track section of this encounter.  Patient instructed to take today's dose.  Patient instructed to take one-and-one-half (1 & 1/2) of your 426mblue colored warfarin tablets by mouth, once-daily, every day.   Patient verbalized understanding of these instructions.   Patient advised to contact clinic or seek medical attention if signs/symptoms of bleeding or thromboembolism occur.  Patient verbalized understanding by repeating back information and was advised to contact me if further medication-related questions arise. Patient was also provided an information handout.  Follow-up Return in 5 weeks (on 12/04/2021) for Follow up INR.  JaPennie BanterPharmD, CPP  15 minutes spent face-to-face with the patient during the encounter. 50% of time spent on education, including signs/sx bleeding and clotting, as well as food and drug interactions with warfarin. 50% of time was spent on fingerprick POC INR sample collection,processing, results determination, and documentation in [email or web address]

## 2021-10-31 ENCOUNTER — Other Ambulatory Visit: Payer: Self-pay | Admitting: *Deleted

## 2021-10-31 DIAGNOSIS — M8080XA Other osteoporosis with current pathological fracture, unspecified site, initial encounter for fracture: Secondary | ICD-10-CM

## 2021-10-31 MED ORDER — ALENDRONATE SODIUM 70 MG PO TABS: 70.0000 mg | ORAL_TABLET | ORAL | 5 refills | Status: DC

## 2021-11-20 ENCOUNTER — Other Ambulatory Visit (HOSPITAL_COMMUNITY): Payer: Self-pay

## 2021-11-20 MED ORDER — SELEXIPAG 600 MCG PO TABS: 600.0000 ug | ORAL_TABLET | Freq: Two times a day (BID) | ORAL | 1 refills | Status: DC

## 2021-11-20 MED ORDER — SELEXIPAG 600 MCG PO TABS: 600.0000 ug | ORAL_TABLET | Freq: Two times a day (BID) | ORAL | 11 refills | Status: DC

## 2021-12-04 ENCOUNTER — Ambulatory Visit (INDEPENDENT_AMBULATORY_CARE_PROVIDER_SITE_OTHER): Payer: Medicare Other | Admitting: Pharmacist

## 2021-12-04 DIAGNOSIS — Z86718 Personal history of other venous thrombosis and embolism: Secondary | ICD-10-CM | POA: Diagnosis not present

## 2021-12-04 DIAGNOSIS — Z86711 Personal history of pulmonary embolism: Secondary | ICD-10-CM | POA: Diagnosis not present

## 2021-12-04 DIAGNOSIS — I2699 Other pulmonary embolism without acute cor pulmonale: Secondary | ICD-10-CM | POA: Diagnosis not present

## 2021-12-04 DIAGNOSIS — Z7901 Long term (current) use of anticoagulants: Secondary | ICD-10-CM | POA: Diagnosis not present

## 2021-12-04 LAB — POCT INR: INR: 2.7 (ref 2.0–3.0)

## 2021-12-04 NOTE — Patient Instructions (Signed)
Patient instructed to take medications as defined in the Anti-coagulation Track section of this encounter.  Patient instructed to take today's dose.  Patient instructed to take one-and-one-half (1 & 1/2) of your 47m blue colored warfarin tablets by mouth, once-daily.   Patient verbalized understanding of these instructions.

## 2021-12-04 NOTE — Progress Notes (Signed)
Anticoagulation Management Jesus Russell is a 70 y.o. male who reports to the clinic for monitoring of warfarin treatment.    Indication: DVT and PE, history of; long term current use of warfarin oral anticoagulant, target INR 2.0 - 3.0.  Duration: indefinite Supervising physician: Gilles Chiquito  Anticoagulation Clinic Visit History: Patient does not report signs/symptoms of bleeding or thromboembolism  Other recent changes: No diet, medications, lifestyle changes endorsed by the patient at this visit.  Anticoagulation Episode Summary     Current INR goal:  2.0-3.0  TTR:  72.4 % (2.1 y)  Next INR check:  01/01/2022  INR from last check:  2.7 (12/04/2021)  Weekly max warfarin dose:    Target end date:  Indefinite  INR check location:    Preferred lab:    Send INR reminders to:     Indications   Long term (current) use of anticoagulants [Z79.01] History of DVT (deep vein thrombosis) [Z86.718] Pulmonary embolism on long-term anticoagulation therapy (HCC) [I26.99 Z79.01] History of pulmonary embolism [Z86.711]        Comments:           No Known Allergies  Current Outpatient Medications:    albuterol (VENTOLIN HFA) 108 (90 Base) MCG/ACT inhaler, Inhale 2 puffs into the lungs every 6 (six) hours as needed for wheezing or shortness of breath., Disp: 18 g, Rfl: 5   alendronate (FOSAMAX) 70 MG tablet, Take 1 tablet (70 mg total) by mouth once a week., Disp: 4 tablet, Rfl: 5   Ascorbic Acid (VITAMIN C) 500 MG CAPS, Take 500 mg by mouth daily., Disp: , Rfl:    cholecalciferol (VITAMIN D3) 25 MCG (1000 UT) tablet, Take 1,000 Units by mouth daily., Disp: , Rfl:    omega-3 acid ethyl esters (LOVAZA) 1 g capsule, Take 1 capsule (1 g total) by mouth 2 (two) times daily., Disp: 180 capsule, Rfl: 1   potassium chloride (KLOR-CON) 20 MEQ packet, Take 60 mEq by mouth daily., Disp: 90 packet, Rfl: 11   Riociguat (ADEMPAS) 2.5 MG TABS, Take 2.5 mg by mouth in the morning, at noon, and at  bedtime., Disp: 90 tablet, Rfl: 11   Selexipag 600 MCG TABS, Take 600 mcg by mouth 2 (two) times daily., Disp: 30 tablet, Rfl: 11   torsemide (DEMADEX) 20 MG tablet, Take 4 tablets (80 mg total) by mouth every morning AND 2 tablets (40 mg total) every evening., Disp: 540 tablet, Rfl: 3   TRELEGY ELLIPTA 200-62.5-25 MCG/INH AEPB, INHALE 1 PUFF INTO THE LUNGS DAILY, Disp: 60 each, Rfl: 3   warfarin (COUMADIN) 4 MG tablet, TAKE 1 TABLET BY MOUTH ON MONDAYS, WEDNESDAYS, FRIDAYS AND SATURDAYS. 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    Social History   Socioeconomic History   Marital status: Divorced    Spouse name: Not on file   Number of children: Not on file   Years of education: Not on file   Highest education level: Not on file  Occupational History   Occupation: retired Pharmacist, hospital  Tobacco Use   Smoking status: Former    Packs/day: 1.50    Years: 23.00    Pack years: 34.50    Types: Cigarettes    Quit date: 1995    Years since quitting: 27.9   Smokeless tobacco: Never  Vaping Use   Vaping Use: Never used  Substance and Sexual Activity   Alcohol use: Never   Drug use: Not Currently     Types: "Crack" cocaine    Comment: quit 2008   Sexual activity: Not on file  Other Topics Concern   Not on file  Social History Narrative   Not on file   Social Determinants of Health   Financial Resource Strain: Not on file  Food Insecurity: Not on file  Transportation Needs: Not on file  Physical Activity: Not on file  Stress: Not on file  Social Connections: Not on file   Family History  Adopted: Yes    ASSESSMENT Recent Results: The most recent result is correlated with 42 mg per week: Lab Results  Component Value Date   INR 2.7 12/04/2021   INR 2.1 10/30/2021   INR 2.0 10/02/2021    Anticoagulation Dosing: Description   Take one-and-one-half (1 & 1/2) of your 87m blue colored warfarin tablets by mouth, once-daily.        INR today: Therapeutic  PLAN Weekly dose was unchanged. Continue to take one-and-one half (1&1/2) of your 446mstrength blue warfarin tablets by mouth, once-daily for a total weekly dose of 42 mg per week.   Patient Instructions  Patient instructed to take medications as defined in the Anti-coagulation Track section of this encounter.  Patient instructed to take today's dose.  Patient instructed to take one-and-one-half (1 & 1/2) of your 33m77mlue colored warfarin tablets by mouth, once-daily.   Patient verbalized understanding of these instructions.   Patient advised to contact clinic or seek medical attention if signs/symptoms of bleeding or thromboembolism occur.  Patient verbalized understanding by repeating back information and was advised to contact me if further medication-related questions arise. Patient was also provided an information handout.  Follow-up Return in 4 weeks (on 01/01/2022) for Follow up INR.  JamPennie BanterharmD, CPP  15 minutes spent face-to-face with the patient during the encounter. 50% of time spent on education, including signs/sx bleeding and clotting, as well as food and drug interactions with warfarin. 50%  of time was spent on fingerprick POC INR sample collection,processing, results determination, and documentation in [email or web address]

## 2021-12-07 ENCOUNTER — Telehealth (HOSPITAL_COMMUNITY): Payer: Self-pay | Admitting: Pharmacist

## 2021-12-07 ENCOUNTER — Other Ambulatory Visit (HOSPITAL_COMMUNITY): Payer: Self-pay

## 2021-12-07 NOTE — Telephone Encounter (Signed)
Patient Advocate Encounter   Received notification from [employer] that prior authorizations for Uptravi and Adempas are required.   PA submitted on CoverMyMeds Status is pending   Will continue to follow.   Audry Riles, PharmD, BCPS, BCCP, CPP Heart Failure Clinic Pharmacist [phone]

## 2021-12-11 NOTE — Progress Notes (Signed)
Evaluation and management procedures were performed by the Clinical Pharmacy Practitioner under my supervision and collaboration. I have reviewed the Practitioner's note and chart, and I agree with the management and plan as documented above.

## 2021-12-19 ENCOUNTER — Telehealth (HOSPITAL_COMMUNITY): Payer: Self-pay | Admitting: Pharmacist

## 2021-12-19 NOTE — Telephone Encounter (Signed)
Advanced Heart Failure Patient Advocate Encounter  Prior Authorizatiosn for Adempas and Malvin Johns have been approved.    Effective dates: 12/24/21 through 12/23/22  Audry Riles, PharmD, BCPS, BCCP, CPP Heart Failure Clinic Pharmacist [phone]

## 2022-01-01 ENCOUNTER — Ambulatory Visit: Payer: Medicare Other

## 2022-01-05 ENCOUNTER — Other Ambulatory Visit (HOSPITAL_COMMUNITY): Payer: Self-pay

## 2022-01-05 MED ORDER — ADEMPAS 2.5 MG PO TABS: 2.5000 mg | ORAL_TABLET | Freq: Three times a day (TID) | ORAL | 3 refills | Status: DC

## 2022-01-17 ENCOUNTER — Ambulatory Visit (INDEPENDENT_AMBULATORY_CARE_PROVIDER_SITE_OTHER): Payer: Medicare Other | Admitting: Pharmacist

## 2022-01-17 DIAGNOSIS — Z7901 Long term (current) use of anticoagulants: Secondary | ICD-10-CM

## 2022-01-17 DIAGNOSIS — Z86711 Personal history of pulmonary embolism: Secondary | ICD-10-CM

## 2022-01-17 DIAGNOSIS — Z86718 Personal history of other venous thrombosis and embolism: Secondary | ICD-10-CM

## 2022-01-17 LAB — POCT INR: INR: 2.7 (ref 2.0–3.0)

## 2022-01-17 NOTE — Patient Instructions (Signed)
Patient instructed to take medications as defined in the Anti-coagulation Track section of this encounter.  Patient instructed to take today's dose.  Patient instructed to take one-and-one-half (1 & 1/2) of your 4mg blue colored warfarin tablets by mouth, once-daily.   Patient verbalized understanding of these instructions.   

## 2022-01-17 NOTE — Progress Notes (Signed)
Anticoagulation Management Jesus Russell is a 71 y.o. male who reports to the clinic for monitoring of warfarin treatment.    Indication: DVT and PE , history of; history of PE while on warfarin.; long term current use of warfarin oral anticoagulant to target INR 2.0 - 3.0.  Duration: indefinite Supervising physician: Aldine Contes  Anticoagulation Clinic Visit History: Patient does not report signs/symptoms of bleeding or thromboembolism  Other recent changes: No diet, medications, lifestyle changes endorsed by the patient at this time. Anticoagulation Episode Summary     Current INR goal:  2.0-3.0  TTR:  74.0 % (2.2 y)  Next INR check:  02/12/2022  INR from last check:  2.7 (01/17/2022)  Weekly max warfarin dose:    Target end date:  Indefinite  INR check location:    Preferred lab:    Send INR reminders to:     Indications   Long term (current) use of anticoagulants [Z79.01] History of DVT (deep vein thrombosis) [Z86.718] Pulmonary embolism on long-term anticoagulation therapy (HCC) [I26.99 Z79.01] History of pulmonary embolism [Z86.711]        Comments:           No Known Allergies  Current Outpatient Medications:    albuterol (VENTOLIN HFA) 108 (90 Base) MCG/ACT inhaler, Inhale 2 puffs into the lungs every 6 (six) hours as needed for wheezing or shortness of breath., Disp: 18 g, Rfl: 5   alendronate (FOSAMAX) 70 MG tablet, Take 1 tablet (70 mg total) by mouth once a week., Disp: 4 tablet, Rfl: 5   Ascorbic Acid (VITAMIN C) 500 MG CAPS, Take 500 mg by mouth daily., Disp: , Rfl:    cholecalciferol (VITAMIN D3) 25 MCG (1000 UT) tablet, Take 1,000 Units by mouth daily., Disp: , Rfl:    omega-3 acid ethyl esters (LOVAZA) 1 g capsule, Take 1 capsule (1 g total) by mouth 2 (two) times daily., Disp: 180 capsule, Rfl: 1   potassium chloride (KLOR-CON) 20 MEQ packet, Take 60 mEq by mouth daily., Disp: 90 packet, Rfl: 11   Riociguat (ADEMPAS) 2.5 MG TABS, Take 2.5 mg by  mouth in the morning, at noon, and at bedtime., Disp: 270 tablet, Rfl: 3   Selexipag 600 MCG TABS, Take 600 mcg by mouth 2 (two) times daily., Disp: 30 tablet, Rfl: 11   torsemide (DEMADEX) 20 MG tablet, Take 4 tablets (80 mg total) by mouth every morning AND 2 tablets (40 mg total) every evening., Disp: 540 tablet, Rfl: 3   TRELEGY ELLIPTA 200-62.5-25 MCG/INH AEPB, INHALE 1 PUFF INTO THE LUNGS DAILY, Disp: 60 each, Rfl: 3   warfarin (COUMADIN) 4 MG tablet, TAKE 1 TABLET BY MOUTH ON MONDAYS, WEDNESDAYS, FRIDAYS AND SATURDAYS. 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    Social History   Socioeconomic History   Marital status: Divorced    Spouse name: Not on file   Number of children: Not on file   Years of education: Not on file   Highest education level: Not on file  Occupational History   Occupation: retired Pharmacist, hospital  Tobacco Use   Smoking status: Former    Packs/day: 1.50    Years: 23.00    Pack years: 34.50    Types: Cigarettes    Quit date: 1995    Years since quitting: 28.0   Smokeless tobacco: Never  Vaping Use   Vaping Use: Never used  Substance and Sexual Activity   Alcohol  use: Never   Drug use: Not Currently    Types: "Crack" cocaine    Comment: quit 2008   Sexual activity: Not on file  Other Topics Concern   Not on file  Social History Narrative   Not on file   Social Determinants of Health   Financial Resource Strain: Not on file  Food Insecurity: Not on file  Transportation Needs: Not on file  Physical Activity: Not on file  Stress: Not on file  Social Connections: Not on file   Family History  Adopted: Yes    ASSESSMENT Recent Results: The most recent result is correlated with 42 mg per week: Lab Results  Component Value Date   INR 2.7 01/17/2022   INR 2.7 12/04/2021   INR 2.1 10/30/2021    Anticoagulation Dosing: Description   Take one-and-one-half (1 & 1/2) of your 16m blue colored warfarin tablets by mouth, once-daily.        INR today: Therapeutic  PLAN Weekly dose was unchanged.  Patient Instructions  Patient instructed to take medications as defined in the Anti-coagulation Track section of this encounter.  Patient instructed to take today's dose.  Patient instructed to take one-and-one-half (1 & 1/2) of your 472mblue colored warfarin tablets by mouth, once-daily.   Patient verbalized understanding of these instructions.   Patient advised to contact clinic or seek medical attention if signs/symptoms of bleeding or thromboembolism occur.  Patient verbalized understanding by repeating back information and was advised to contact me if further medication-related questions arise. Patient was also provided an information handout.  Follow-up Return in 26 days (on 02/12/2022) for Follow up INR.  JaPennie BanterPharmD, CPP  15 minutes spent face-to-face with the patient during the encounter. 50% of time spent on education, including signs/sx bleeding and clotting, as well as food and drug interactions with warfarin. 50% of time was spent on fingerprick POC INR sample collection,processing, results determination, and  documentation in [email or web address]

## 2022-01-18 NOTE — Progress Notes (Signed)
INTERNAL MEDICINE TEACHING ATTENDING ADDENDUM - Aldine Contes M.D  Duration- indefinite, Indication- DVT, PE, INR- therapeutic. Agree with pharmacy recommendations as outlined in their note.

## 2022-01-24 ENCOUNTER — Other Ambulatory Visit: Payer: Self-pay

## 2022-01-24 ENCOUNTER — Ambulatory Visit (HOSPITAL_COMMUNITY)
Admission: RE | Admit: 2022-01-24 | Discharge: 2022-01-24 | Disposition: A | Discharge status: Home / Self Care | Payer: Medicare Other | Source: Ambulatory Visit | Attending: Cardiology | Admitting: Cardiology

## 2022-01-24 ENCOUNTER — Encounter (HOSPITAL_COMMUNITY): Payer: Self-pay | Admitting: Cardiology

## 2022-01-24 VITALS — BP 120/66 | HR 71 | Ht 65.0 in | Wt 190.0 lb

## 2022-01-24 DIAGNOSIS — I50812 Chronic right heart failure: Secondary | ICD-10-CM | POA: Diagnosis not present

## 2022-01-24 DIAGNOSIS — Z9981 Dependence on supplemental oxygen: Secondary | ICD-10-CM | POA: Diagnosis not present

## 2022-01-24 DIAGNOSIS — Z7901 Long term (current) use of anticoagulants: Secondary | ICD-10-CM | POA: Diagnosis not present

## 2022-01-24 DIAGNOSIS — D860 Sarcoidosis of lung: Secondary | ICD-10-CM | POA: Diagnosis not present

## 2022-01-24 DIAGNOSIS — I4510 Unspecified right bundle-branch block: Secondary | ICD-10-CM | POA: Diagnosis not present

## 2022-01-24 DIAGNOSIS — I110 Hypertensive heart disease with heart failure: Secondary | ICD-10-CM | POA: Diagnosis not present

## 2022-01-24 DIAGNOSIS — M2550 Pain in unspecified joint: Secondary | ICD-10-CM | POA: Insufficient documentation

## 2022-01-24 DIAGNOSIS — I272 Pulmonary hypertension, unspecified: Secondary | ICD-10-CM | POA: Diagnosis present

## 2022-01-24 DIAGNOSIS — Z79899 Other long term (current) drug therapy: Secondary | ICD-10-CM | POA: Insufficient documentation

## 2022-01-24 DIAGNOSIS — G4733 Obstructive sleep apnea (adult) (pediatric): Secondary | ICD-10-CM | POA: Insufficient documentation

## 2022-01-24 DIAGNOSIS — Z86718 Personal history of other venous thrombosis and embolism: Secondary | ICD-10-CM | POA: Insufficient documentation

## 2022-01-24 DIAGNOSIS — D6862 Lupus anticoagulant syndrome: Secondary | ICD-10-CM | POA: Insufficient documentation

## 2022-01-24 DIAGNOSIS — Z86711 Personal history of pulmonary embolism: Secondary | ICD-10-CM | POA: Diagnosis not present

## 2022-01-24 LAB — BASIC METABOLIC PANEL
Anion gap: 12 (ref 5–15)
BUN: 15 mg/dL (ref 8–23)
CO2: 32 mmol/L (ref 22–32)
Calcium: 9.3 mg/dL (ref 8.9–10.3)
Chloride: 92 mmol/L — ABNORMAL LOW (ref 98–111)
Creatinine, Ser: 1.29 mg/dL — ABNORMAL HIGH (ref 0.61–1.24)
GFR, Estimated: >60 mL/min (ref >60)
Glucose, Bld: 81 mg/dL (ref 70–99)
Potassium: 4.6 mmol/L (ref 3.5–5.1)
Sodium: 136 mmol/L (ref 135–145)

## 2022-01-24 NOTE — Progress Notes (Signed)
6 Min Walk Test Completed  Pt ambulated 228.6 meters O2 Sat ranged 93%-100% on 3L oxygen HR ranged 72-103

## 2022-01-24 NOTE — Patient Instructions (Signed)
6 minute walk test done today.   Labs done today. We will contact you only if your labs are abnormal.  No medication changes were made. Please continue all current medications as prescribed.  Your physician recommends that you schedule a follow-up appointment in: 3 months with an echo prior to your exam.  If you have any questions or concerns before your next appointment please send [place] a message through [place] or call our office at [phone].    TO LEAVE A MESSAGE FOR THE NURSE SELECT OPTION 2, PLEASE LEAVE A MESSAGE INCLUDING: YOUR NAME DATE OF BIRTH CALL BACK NUMBER REASON FOR CALL**this is important as we prioritize the call backs  YOU WILL RECEIVE A CALL BACK THE SAME DAY AS LONG AS YOU CALL BEFORE 4:00 PM   Do the following things EVERYDAY: Weigh yourself in the morning before breakfast. Write it down and keep it in a log. Take your medicines as prescribed Eat low salt foods--Limit salt (sodium) to 2000 mg per day.  Stay as active as you can everyday Limit all fluids for the day to less than 2 liters   At the [facility], you and your health needs are our priority. As part of our continuing mission to provide you with exceptional heart care, we have created designated Provider Care Teams. These Care Teams include your primary Cardiologist (physician) and Advanced Practice Providers (APPs- Physician Assistants and Nurse Practitioners) who all work together to provide you with the care you need, when you need it.   You may see any of the following providers on your designated Care Team at your next follow up: Dr Glori Bickers Dr Haynes Kerns, NP Lyda Jester, [place] Audry Riles, PharmD   Please be sure to bring in all your medications bottles to every appointment.

## 2022-01-25 NOTE — Progress Notes (Signed)
PCP: Madalyn Rob, MD HF Cardiology: Dr. Aundra Dubin  71 y.o. with history of sarcoidosis, prior PE, RV failure was referred by Dr. Shearon Stalls for evaluation of CHF/pulmonary hypertension.  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  Patient was on prednisone for a period of time, this was stopped earlier this year.  He is on 2 L home oxygen.  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 He has had recurrent PEs/DVTs.  He has an IVC filter.  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  Most recent PE was in 2/21 when he had been off warfarin transiently.  Last echo in 5/21 showed EF 55-60% with D-shaped septum and severely dilated/severely dysfunctional RV.  No estimation of PA pressure was available.    RHC in 7/21 showed normal PCWP and moderate PAH with PVR 5 WU. V/Q showed perfusion defects but thought to be due to sarcoidosis.  CTA chest in 7/21 showed no acute or chronic PE, signs of chronic sarcoidosis were present.   Echo (5/22) with EF 55-60%, moderate focal basal septal hypertrophy, severe RV enlargement with moderately decreased systolic function.   Patient has had to cut back on Uptravi due to joint pain and fatigue.  He is currently taking 600 mcg bid.  He stopped Opsumit due to severe peripheral edema. He tried restarting it, and the edema came back, so he stopped it for good.   Patient presents for followup of pulmonary hypertension/RV failure.  He is now using CPAP and 2-3 L home oxygen.  Breathing is stable, he is winded walking about 100 feet.  6 minute walk today is actually improved.  No chest pain.  No lightheadedness/syncope.    ECG (personally reviewed): NSR, RBBB, LAFB  Labs (6/21): pro-BNP 43, K 4.6, creatinine 0.89 Labs (8/21): hgb 10.5, K 3.8, creatinine 1.28 Labs (10/21): K 5.4, creatinine 1.1 Labs (11/21): K 3.3, creatinine 1.14 Labs (1/22): BNP 365 Labs (2/22): K 4, creatinine 1.2 Labs (3/22): K 3.9,  creatinine 1.49, BNP 21 Labs (6/22): BNP 23, K 5, creatinine 1.33 Labs (10/22): K 4, creatinine 1.25, BNP 48  6 minute walk (11/21): 76 m 6 minute walk (1/22): 183 m 6 minute walk (3/22): 198 m 6 minute walk (5/22): 183 m 6 minute walk (10/22): 152 m 6 minute walk (1/23): 39 m  PMH: 1. Sarcoidosis: "Burned out."  Diagnosed in 1990 with bronchoscopy/biopsy.  Treated with prednisone, now off.  - On 2 L home oxygen chronically.  2. 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 H/o DVT and PE.  Has lupus anticoagulant.   - Most recent PE was in 2/21, he had been off warfarin transiently.  - V/Q scan in 7/21 with perfusion defects, CTA in 7/21 showed no acute or chronic thrombi, signs of sarcoidosis were present.  4. COVID-19 infection 1/21.  5. H/o compression fractures in 2/21.  - Had kyphoplasty 6. RV failure/pulmonary hypertension:  - Echo (5/21) with EF 55-60%, D-shaped interventricular septum, severely dilated RV with severe systolic dysfunction, dilated IVC.  - RHC (7/21) with mean RA 9, PA 62/21 mean 39, mean PCWP 11, CI 2.64, PVR 5 WU.  - Did not tolerate Tyvaso.  - Echo (5/22): EF 55-60%, moderate focal basal septal hypertrophy, severe RV enlargement with moderately decreased systolic function.  - Unable to tolerate Opsumit.  7. HTN 8. History of subdural hematoma remotely.   Social History   Socioeconomic History   Marital status: Divorced    Spouse name:  Not on file   Number of children: Not on file   Years of education: Not on file   Highest education level: Not on file  Occupational History   Occupation: retired Pharmacist, hospital  Tobacco Use   Smoking status: Former    Packs/day: 1.50    Years: 23.00    Pack years: 34.50    Types: Cigarettes    Quit date: 1995    Years since quitting: 28.1   Smokeless tobacco: Never  Vaping Use   Vaping Use: Never used  Substance and Sexual Activity   Alcohol use: Never   Drug use: Not Currently    Types: "Crack" cocaine     Comment: quit 2008   Sexual activity: Not on file  Other Topics Concern   Not on file  Social History Narrative   Not on file   Social Determinants of Health   Financial Resource Strain: Not on file  Food Insecurity: Not on file  Transportation Needs: Not on file  Physical Activity: Not on file  Stress: Not on file  Social Connections: Not on file  Intimate Partner Violence: Not on file   Family History  Adopted: Yes   ROS: All systems reviewed and negative except as per HPI.   Current Outpatient Medications  Medication Sig Dispense Refill   albuterol (VENTOLIN HFA) 108 (90 Base) MCG/ACT inhaler Inhale 2 puffs into the lungs every 6 (six) hours as needed for wheezing or shortness of breath. 18 g 5   alendronate (FOSAMAX) 70 MG tablet Take 1 tablet (70 mg total) by mouth once a week. 4 tablet 5   Ascorbic Acid (VITAMIN C) 500 MG CAPS Take 500 mg by mouth daily.     cholecalciferol (VITAMIN D3) 25 MCG (1000 UT) tablet Take 1,000 Units by mouth daily.     omega-3 acid ethyl esters (LOVAZA) 1 g capsule Take 1 capsule (1 g total) by mouth 2 (two) times daily. 180 capsule 1   potassium chloride (KLOR-CON) 20 MEQ packet Take 60 mEq by mouth daily. 90 packet 11   Riociguat (ADEMPAS) 2.5 MG TABS Take 2.5 mg by mouth in the morning, at noon, and at bedtime. 270 tablet 3   Selexipag 600 MCG TABS Take 600 mcg by mouth 2 (two) times daily. 30 tablet 11   torsemide (DEMADEX) 20 MG tablet Take 4 tablets (80 mg total) by mouth every morning AND 2 tablets (40 mg total) every evening. 540 tablet 3   TRELEGY ELLIPTA 200-62.5-25 MCG/INH AEPB INHALE 1 PUFF INTO THE LUNGS DAILY 60 each 3   warfarin (COUMADIN) 4 MG tablet TAKE 1 TABLET BY MOUTH ON MONDAYS, WEDNESDAYS, FRIDAYS AND SATURDAYS. ON SUNDAYS, TUESDAYS AND THURSDAYS, TAKE 1 AND 1/2 TABLETS. 108 tablet 1   zinc gluconate 50 MG tablet Take 50 mg by mouth daily.     No current facility-administered medications for this encounter.   BP  120/66    Pulse 71    Ht _0  (1.651 m)    Wt 86.2 kg (190 lb)    SpO2 98%    BMI 31.62 kg/m  General: NAD, kyphotic Neck: No JVD, no thyromegaly or thyroid nodule.  Lungs: Crackles at bases.  CV: Nondisplaced PMI.  Heart regular S1/S2, no S3/S4, no murmur.  1+ ankle edema.  No carotid bruit.  Normal pedal pulses.  Abdomen: Soft, nontender, no hepatosplenomegaly, no distention.  Skin: Intact without lesions or rashes.  Neurologic: Alert and oriented x 3.  Psych: Normal affect. Extremities: No  clubbing or cyanosis.  HEENT: Normal.   Assessment/Plan: 1. RV failure, pulmonary hypertension: Echo in 5/21 showed EF 55-60%, D-shaped septum with severe RV dilation/severe RV dysfunction.  [place] in 7/21 showed mildly elevated RA pressure, moderate PAH with PVR 5 WU.  V/Q scan showed perfusion defects, but CTA chest in 7/21 showed no acute or chronic PE, V/Q findings were likely due to chronic sarcoidosis changes in lungs.  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  Echo in 5/22 showed mildly improved RV function compared to 5/21. 6 minute walk today improved.  He did not tolerate Tyvaso. He is now off Opsumit (cannot tolerate due to swelling).  He is not volume overloaded on exam.  NYHA class III symptoms chronically.  - Continue torsemide 80 qam/40 qpm.  BMET/BNP today.  - Off Opsumit, unable to tolerate.  - Continue Adempas 2.5 mg tid.   - He has been unable to increase Uptravi beyond 600 mcg bid due to joint pain.  - Continue home oxygen and CPAP.  - Repeat echo at followup in 3 months.  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  Has positive lupus anticoagulant.  - Continue warfarin, should have Lovenox bridge if stops warfarin (had PE when off warfarin transiently in 2/21).  3. Sarcoidosis: History of "burned out" pulmonary sarcoidosis.  This may be the cause of his pulmonary hypertension.  No definitive evidence for cardiac sarcoidosis, but could arrange  for cardiac MRI in the future to investigate. He is no longer on prednisone.  - Continue 2 L home oxygen.  4. HTN: BP controlled.  5. OSA: Use CPAP.   Followup 3 months with repeat echo.     Loralie Champagne 01/25/2022

## 2022-02-02 ENCOUNTER — Other Ambulatory Visit: Payer: Self-pay | Admitting: Pharmacist

## 2022-02-02 MED ORDER — WARFARIN SODIUM 4 MG PO TABS: 6.0000 mg | ORAL_TABLET | Freq: Every day | ORAL | 1 refills | Status: DC

## 2022-02-02 NOTE — Telephone Encounter (Signed)
Requests refill on warfarin. Currently taking 1&1/2 x 10m (630mdose) PO daily. [facility]

## 2022-02-12 ENCOUNTER — Ambulatory Visit: Payer: Medicare Other

## 2022-02-13 ENCOUNTER — Ambulatory Visit (INDEPENDENT_AMBULATORY_CARE_PROVIDER_SITE_OTHER): Payer: Medicare Other | Admitting: Pharmacist

## 2022-02-13 DIAGNOSIS — Z86718 Personal history of other venous thrombosis and embolism: Secondary | ICD-10-CM

## 2022-02-13 DIAGNOSIS — Z7901 Long term (current) use of anticoagulants: Secondary | ICD-10-CM

## 2022-02-13 DIAGNOSIS — Z86711 Personal history of pulmonary embolism: Secondary | ICD-10-CM | POA: Diagnosis not present

## 2022-02-13 DIAGNOSIS — I2699 Other pulmonary embolism without acute cor pulmonale: Secondary | ICD-10-CM | POA: Diagnosis not present

## 2022-02-13 LAB — POCT INR: INR: 2 (ref 2.0–3.0)

## 2022-02-13 IMAGING — DX DG CHEST 2V
2 series · 2 of 2 positions shown · non-contrast
Comparison: 05/20/2020.  CT chest 02/14/2020

CLINICAL DATA: Dyspnea.  Sarcoidosis.

EXAM:
CHEST - 2 VIEW

[chest pa]
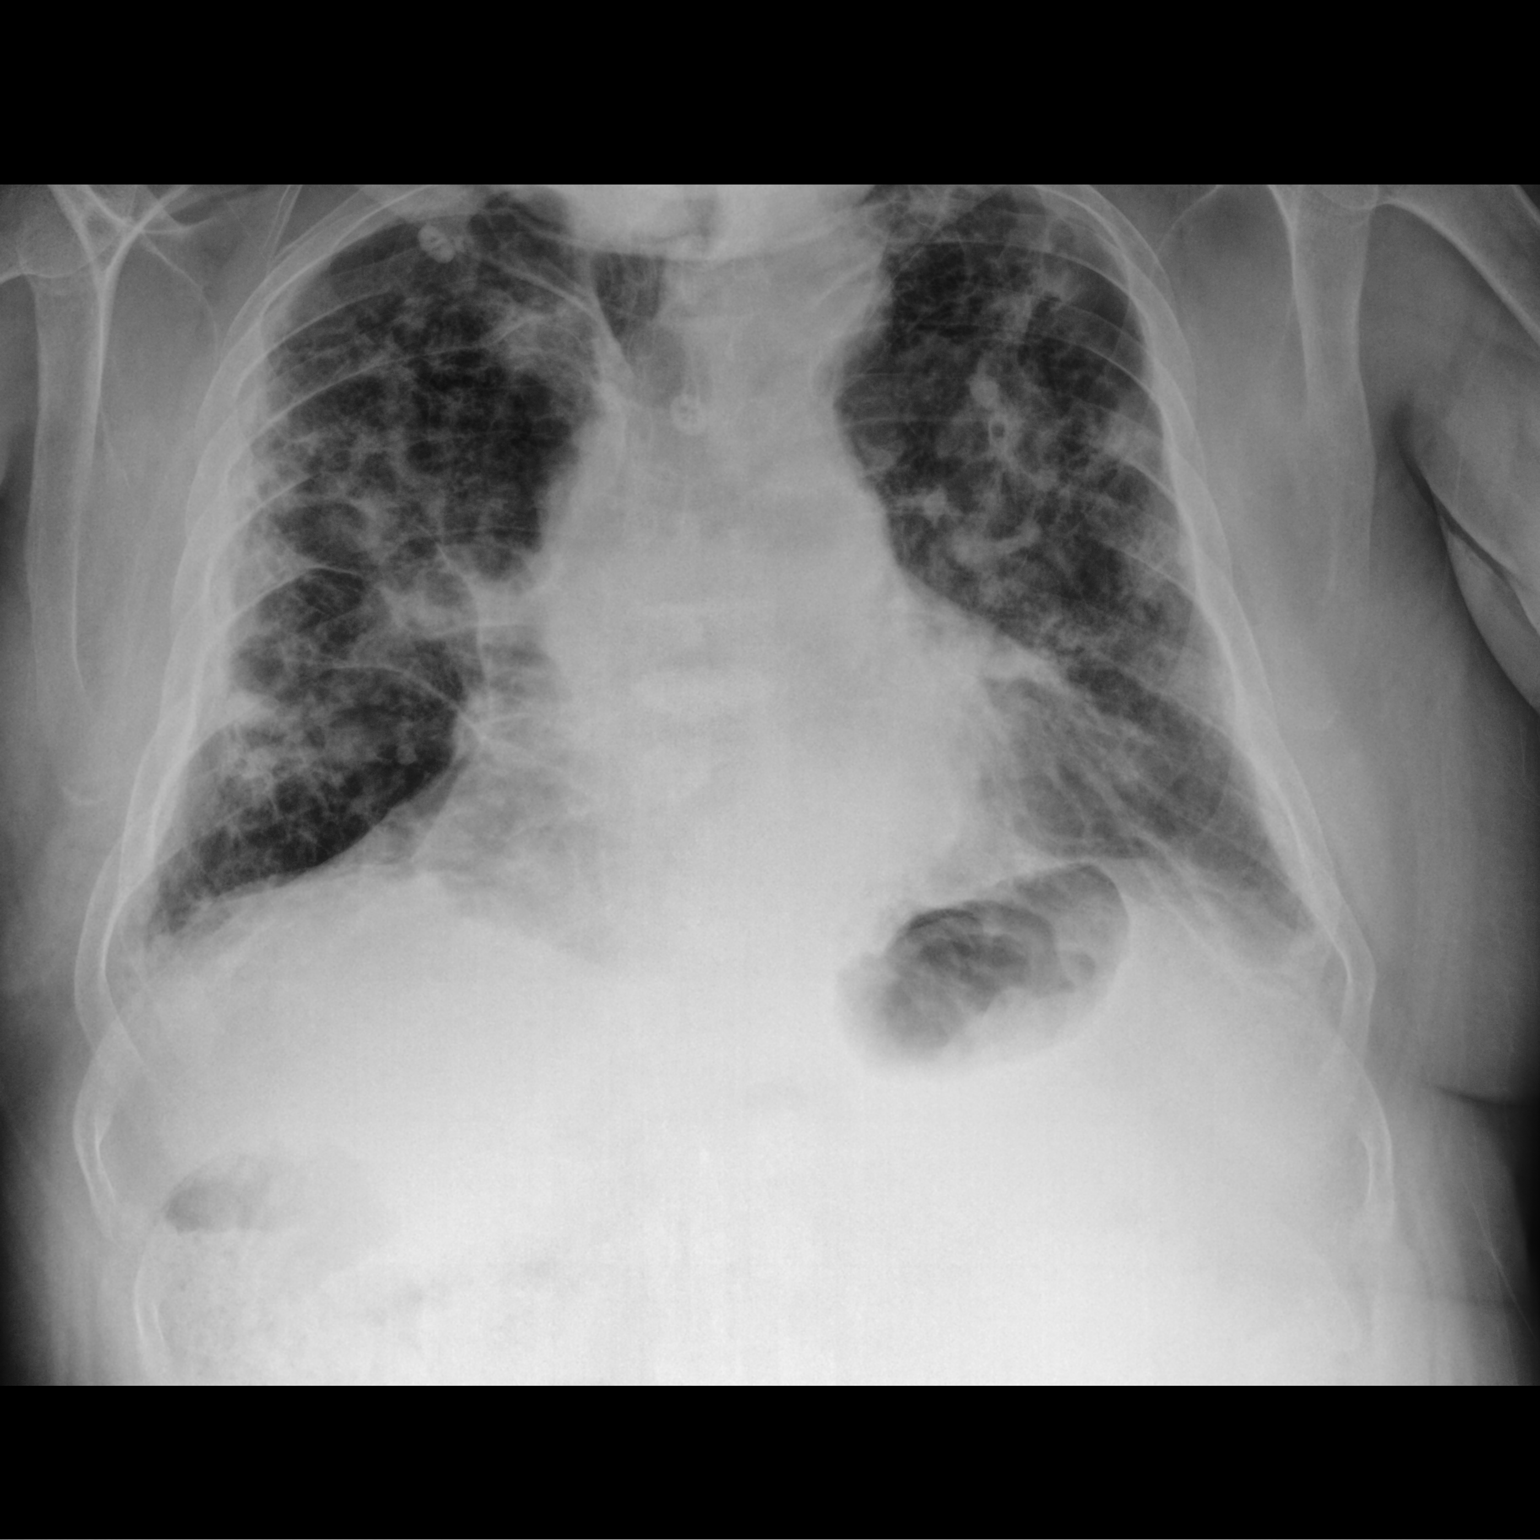

[chest lat]
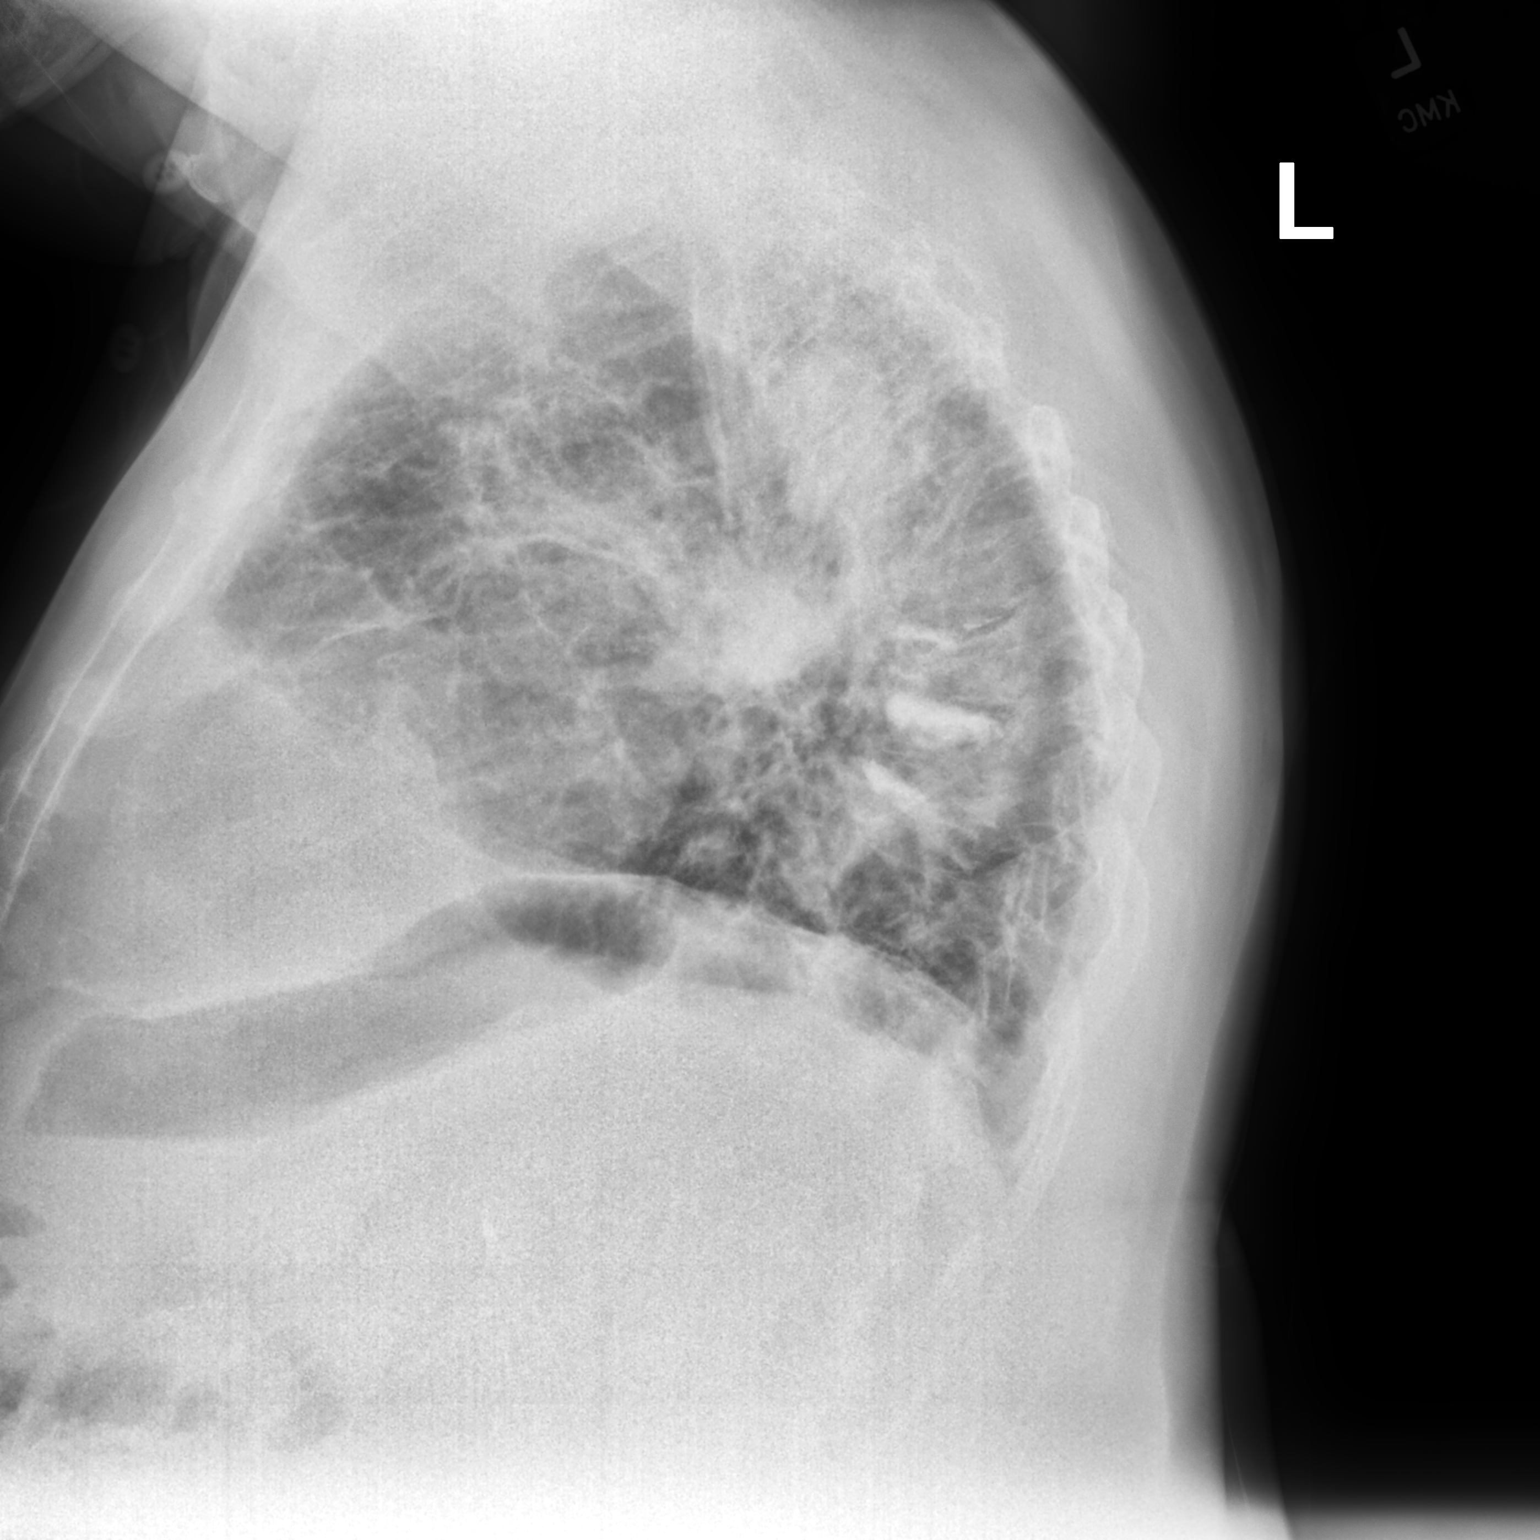

[2 of 2 positions shown; findings below may reference images not displayed]

FINDINGS: Patchy diffuse bilateral airspace disease unchanged compatible with
scarring which could be seen with sarcoidosis. No superimposed acute
infiltrate or effusion. Negative for heart failure.
IMPRESSION: Chronic lung disease and scarring stable.

## 2022-02-13 NOTE — Patient Instructions (Signed)
Patient instructed to take medications as defined in the Anti-coagulation Track section of this encounter.  Patient instructed to take today's dose.  Patient instructed to take one-and-one-half (1 & 1/2) of your 29m blue colored warfarin tablets by mouth, once-daily--EXCEPT ON WEDNESDAYS, take TWO (2) tablets on Wednesdays of each week.   Patient verbalized understanding of these instructions.

## 2022-02-13 NOTE — Progress Notes (Signed)
Anticoagulation Management Jesus Russell is a 71 y.o. male who reports to the clinic for monitoring of warfarin treatment.    Indication: DVT and PE , history of; long term current use of oral anticoagulant, warfarin--to target INR of 2.0 - 3.0.  Duration: indefinite Supervising physician: Jesus Russell  Anticoagulation Clinic Visit History: Patient does not report signs/symptoms of bleeding or thromboembolism  Other recent changes: No diet, medications, lifestyle changes endorsed.  Anticoagulation Episode Summary     Current INR goal:  2.0-3.0  TTR:  74.8 % (2.3 y)  Next INR check:  03/12/2022  INR from last check:  2.0 (02/13/2022)  Weekly max warfarin dose:    Target end date:  Indefinite  INR check location:    Preferred lab:    Send INR reminders to:     Indications   Long term (current) use of anticoagulants [Z79.01] History of DVT (deep vein thrombosis) [Z86.718] Pulmonary embolism on long-term anticoagulation therapy (HCC) [I26.99 Z79.01] History of pulmonary embolism [Z86.711]        Comments:           No Known Allergies  Current Outpatient Medications:    albuterol (VENTOLIN HFA) 108 (90 Base) MCG/ACT inhaler, Inhale 2 puffs into the lungs every 6 (six) hours as needed for wheezing or shortness of breath., Disp: 18 g, Rfl: 5   alendronate (FOSAMAX) 70 MG tablet, Take 1 tablet (70 mg total) by mouth once a week., Disp: 4 tablet, Rfl: 5   Ascorbic Acid (VITAMIN C) 500 MG CAPS, Take 500 mg by mouth daily., Disp: , Rfl:    cholecalciferol (VITAMIN D3) 25 MCG (1000 UT) tablet, Take 1,000 Units by mouth daily., Disp: , Rfl:    omega-3 acid ethyl esters (LOVAZA) 1 g capsule, Take 1 capsule (1 g total) by mouth 2 (two) times daily., Disp: 180 capsule, Rfl: 1   potassium chloride (KLOR-CON) 20 MEQ packet, Take 60 mEq by mouth daily., Disp: 90 packet, Rfl: 11   Riociguat (ADEMPAS) 2.5 MG TABS, Take 2.5 mg by mouth in the morning, at noon, and at bedtime., Disp: 270  tablet, Rfl: 3   Selexipag 600 MCG TABS, Take 600 mcg by mouth 2 (two) times daily., Disp: 30 tablet, Rfl: 11   torsemide (DEMADEX) 20 MG tablet, Take 4 tablets (80 mg total) by mouth every morning AND 2 tablets (40 mg total) every evening., Disp: 540 tablet, Rfl: 3   TRELEGY ELLIPTA 200-62.5-25 MCG/INH AEPB, INHALE 1 PUFF INTO THE LUNGS DAILY, Disp: 60 each, Rfl: 3   warfarin (COUMADIN) 4 MG tablet, Take 1.5 tablets (6 mg total) by mouth daily at 4 PM., Disp: 132 tablet, Rfl: 1   zinc gluconate 50 MG tablet, Take 50 mg by mouth daily., Disp: , Rfl:  Past Medical History:  Diagnosis Date   Acute on chronic respiratory failure with hypoxia ([place])    COVID-19 virus infection    DVT of lower extremity, bilateral (HCC)    Epistaxis    Hypertension    Incarcerated umbilical hernia 16/1096   Status post repair   Lupus anticoagulant positive    Per records from [facility]   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    Social History   Socioeconomic History   Marital status: Divorced    Spouse name: Not on file   Number of children: Not on file   Years of education: Not on file   Highest education level: Not on file  Occupational History   Occupation: retired Pharmacist, hospital  Tobacco Use   Smoking status: Former    Packs/day: 1.50    Years: 23.00    Pack years: 34.50    Types: Cigarettes    Quit date: 1995    Years since quitting: 28.1   Smokeless tobacco: Never  Vaping Use   Vaping Use: Never used  Substance and Sexual Activity   Alcohol use: Never   Drug use: Not Currently    Types: "Crack" cocaine    Comment: quit 2008   Sexual activity: Not on file  Other  Topics Concern   Not on file  Social History Narrative   Not on file   Social Determinants of Health   Financial Resource Strain: Not on file  Food Insecurity: Not on file  Transportation Needs: Not on file  Physical Activity: Not on file  Stress: Not on file  Social Connections: Not on file   Family History  Adopted: Yes    ASSESSMENT Recent Results: The most recent result is correlated with 42 mg per week: Lab Results  Component Value Date   INR 2.0 02/13/2022   INR 2.7 01/17/2022   INR 2.7 12/04/2021    Anticoagulation Dosing: Description   Take one-and-one-half (1 & 1/2) of your 43m blue colored warfarin tablets by mouth, once-daily--EXCEPT ON WEDNESDAYS, take TWO (2) tablets on Wednesdays of each week.       INR today: Therapeutic  PLAN Weekly dose was increased by 5% to 44 mg per week  Patient Instructions  Patient instructed to take medications as defined in the Anti-coagulation Track section of this encounter.  Patient instructed to take today's dose.  Patient instructed to take one-and-one-half (1 & 1/2) of your 428mblue colored warfarin tablets by mouth, once-daily--EXCEPT ON WEDNESDAYS, take TWO (2) tablets on Wednesdays of each week.   Patient verbalized understanding of these instructions.   Patient advised to contact clinic or seek medical attention if signs/symptoms of bleeding or thromboembolism occur.  Patient verbalized understanding by repeating back information and was advised to contact me if further medication-related questions arise. Patient was also provided an information handout.  Follow-up Return in 27 days (on 03/12/2022) for Follow up INR.  Jesus BanterPharmD, CPP  15 minutes spent face-to-face with the patient during the encounter. 50% of time spent on education, including signs/sx bleeding and clotting, as well as food and drug interactions with warfarin. 50% of time was spent on fingerprick POC INR sample collection,processing,  results determination, and documentation in [email or web address]

## 2022-02-13 NOTE — Progress Notes (Signed)
INTERNAL MEDICINE TEACHING ATTENDING ADDENDUM ° °I agree with pharmacy recommendations as outlined in their note.  ° °-Amal Saiki MD ° °

## 2022-03-12 ENCOUNTER — Ambulatory Visit: Payer: Medicare Other

## 2022-03-19 ENCOUNTER — Ambulatory Visit (INDEPENDENT_AMBULATORY_CARE_PROVIDER_SITE_OTHER): Payer: Medicare Other | Admitting: Pharmacist

## 2022-03-19 DIAGNOSIS — Z7901 Long term (current) use of anticoagulants: Secondary | ICD-10-CM

## 2022-03-19 DIAGNOSIS — Z86718 Personal history of other venous thrombosis and embolism: Secondary | ICD-10-CM | POA: Diagnosis not present

## 2022-03-19 DIAGNOSIS — Z86711 Personal history of pulmonary embolism: Secondary | ICD-10-CM | POA: Diagnosis not present

## 2022-03-19 DIAGNOSIS — I2699 Other pulmonary embolism without acute cor pulmonale: Secondary | ICD-10-CM | POA: Diagnosis not present

## 2022-03-19 LAB — POCT INR: INR: 2 (ref 2.0–3.0)

## 2022-03-19 NOTE — Patient Instructions (Signed)
Patient instructed to take medications as defined in the Anti-coagulation Track section of this encounter.  ?Patient instructed to take today's dose.  ?Patient instructed to take one-and-one-half (1 & 1/2) of your 73m blue colored warfarin tablets by mouth, once-daily--EXCEPT ON WEDNESDAYS, take TWO (2) tablets on Wednesdays of each week.   ?Patient verbalized understanding of these instructions.   ?

## 2022-03-19 NOTE — Progress Notes (Signed)
Anticoagulation Management ?Jesus Russell is a 71 y.o. male who reports to the clinic for monitoring of warfarin treatment.   ? ?Indication: DVT and PE , History of; Long term current use of oral anticoagulant, warfarin for target INR 2.0 - 3.0. ?Duration: indefinite ?Supervising physician: Gilles Chiquito ? ?Anticoagulation Clinic Visit History: ?Patient does not report signs/symptoms of bleeding or thromboembolism  ?Other recent changes: No diet, medications, lifestyle changes, EXCEPT AS NOTED IN PATIENT FINDINGS. ?Anticoagulation Episode Summary   ? ? Current INR goal:  2.0-3.0  ?TTR:  75.8 % (2.4 y)  ?Next INR check:  04/16/2022  ?INR from last check:  2.0 (03/19/2022)  ?Weekly max warfarin dose:    ?Target end date:  Indefinite  ?INR check location:    ?Preferred lab:    ?Send INR reminders to:    ? Indications   ?Long term (current) use of anticoagulants [Z79.01] ?History of DVT (deep vein thrombosis) [Z86.718] ?Pulmonary embolism on long-term anticoagulation therapy ([place]) [I26.99 ?Z79.01] ?History of pulmonary embolism [Z86.711] ? ?  ?  ? ? Comments:    ?  ? ?  ? ? ?No Known Allergies ? ?Current Outpatient Medications:  ?  albuterol (VENTOLIN HFA) 108 (90 Base) MCG/ACT inhaler, Inhale 2 puffs into the lungs every 6 (six) hours as needed for wheezing or shortness of breath., Disp: 18 g, Rfl: 5 ?  alendronate (FOSAMAX) 70 MG tablet, Take 1 tablet (70 mg total) by mouth once a week., Disp: 4 tablet, Rfl: 5 ?  Ascorbic Acid (VITAMIN C) 500 MG CAPS, Take 500 mg by mouth daily., Disp: , Rfl:  ?  cholecalciferol (VITAMIN D3) 25 MCG (1000 UT) tablet, Take 1,000 Units by mouth daily., Disp: , Rfl:  ?  omega-3 acid ethyl esters (LOVAZA) 1 g capsule, Take 1 capsule (1 g total) by mouth 2 (two) times daily., Disp: 180 capsule, Rfl: 1 ?  potassium chloride (KLOR-CON) 20 MEQ packet, Take 60 mEq by mouth daily., Disp: 90 packet, Rfl: 11 ?  Riociguat (ADEMPAS) 2.5 MG TABS, Take 2.5 mg by mouth in the morning, at noon, and at  bedtime., Disp: 270 tablet, Rfl: 3 ?  Selexipag 600 MCG TABS, Take 600 mcg by mouth 2 (two) times daily., Disp: 30 tablet, Rfl: 11 ?  torsemide (DEMADEX) 20 MG tablet, Take 4 tablets (80 mg total) by mouth every morning AND 2 tablets (40 mg total) every evening., Disp: 540 tablet, Rfl: 3 ?  TRELEGY ELLIPTA 200-62.5-25 MCG/INH AEPB, INHALE 1 PUFF INTO THE LUNGS DAILY, Disp: 60 each, Rfl: 3 ?  warfarin (COUMADIN) 4 MG tablet, Take 1.5 tablets (6 mg total) by mouth daily at 4 PM., Disp: 132 tablet, Rfl: 1 ?  zinc gluconate 50 MG tablet, Take 50 mg by mouth daily., Disp: , Rfl:  ?Past Medical History:  ?Diagnosis Date  ? Acute on chronic respiratory failure with hypoxia (HCC)   ? COVID-19 virus infection   ? DVT of lower extremity, bilateral ([place])   ? Epistaxis   ? Hypertension   ? Incarcerated umbilical hernia 58/5277  ? Status post repair  ? Lupus anticoagulant positive   ? Per records from [facility]  ? 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 Was present on CT scan (from [place]) on 05/27/2018  ? Pulmonary artery hypertension ([place]) 04/2011  ? Mildly elevated pulmonary artery pressure in setting of PE (from [facility])  ? Pulmonary embolism associated with COVID-19 ([place])   ? Subdural hematoma   ?  Remote episode, occurred when taking excedrin and warfarin.   ? ?Social History  ? ?Socioeconomic History  ? Marital status: Divorced  ?  Spouse name: Not on file  ? Number of children: Not on file  ? Years of education: Not on file  ? Highest education level: Not on file  ?Occupational History  ? Occupation: retired Pharmacist, hospital  ?Tobacco Use  ? Smoking status: Former  ?  Packs/day: 1.50  ?  Years: 23.00  ?  Pack years: 34.50  ?  Types: Cigarettes  ?  Quit date: 1995  ?  Years since quitting: 28.2  ? Smokeless tobacco: Never  ?Vaping Use  ? Vaping Use: Never used  ?Substance and Sexual Activity  ? Alcohol use: Never  ? Drug use: Not Currently  ?  Types: "Crack" cocaine  ?  Comment: quit 2008  ? Sexual activity: Not  on file  ?Other Topics Concern  ? Not on file  ?Social History Narrative  ? Not on file  ? ?Social Determinants of Health  ? ?Financial Resource Strain: Not on file  ?Food Insecurity: Not on file  ?Transportation Needs: Not on file  ?Physical Activity: Not on file  ?Stress: Not on file  ?Social Connections: Not on file  ? ?Family History  ?Adopted: Yes  ? ? ?ASSESSMENT ?Recent Results: ?The most recent result is correlated with 44 mg per week: ?Lab Results  ?Component Value Date  ? INR 2.0 03/19/2022  ? INR 2.0 02/13/2022  ? INR 2.7 01/17/2022  ? ? ?Anticoagulation Dosing: ?Description   ?Take one-and-one-half (1 & 1/2) of your 42m blue colored warfarin tablets by mouth, once-daily--EXCEPT ON WEDNESDAYS, take TWO (2) tablets on Wednesdays of each week.   ?  ?  ?INR today: Therapeutic ? ?PLAN ?Weekly dose was unchanged.  ? ?Patient Instructions  ?Patient instructed to take medications as defined in the Anti-coagulation Track section of this encounter.  ?Patient instructed to take today's dose.  ?Patient instructed to take one-and-one-half (1 & 1/2) of your 431mblue colored warfarin tablets by mouth, once-daily--EXCEPT ON WEDNESDAYS, take TWO (2) tablets on Wednesdays of each week.   ?Patient verbalized understanding of these instructions.   ?Patient advised to contact clinic or seek medical attention if signs/symptoms of bleeding or thromboembolism occur. ? ?Patient verbalized understanding by repeating back information and was advised to contact me if further medication-related questions arise. Patient was also provided an information handout. ? ?Follow-up ?Return in 4 weeks (on 04/16/2022) for Follow up INR. ? ?JaPennie BanterPharmD, CPP ? ?15 minutes spent face-to-face with the patient during the encounter. 50% of time spent on education, including signs/sx bleeding and clotting, as well as food and drug interactions with warfarin. 50% of time was spent on fingerprick POC INR sample collection,processing, results  determination, and documentation in [email or web address] ?

## 2022-03-22 NOTE — Progress Notes (Signed)
Evaluation and management procedures were performed by the Clinical Pharmacy Practitioner under my supervision and collaboration. I have reviewed the Practitioner's note and chart, and I agree with the management and plan as documented above. ° °

## 2022-03-27 ENCOUNTER — Ambulatory Visit (INDEPENDENT_AMBULATORY_CARE_PROVIDER_SITE_OTHER): Payer: Medicare Other | Admitting: Internal Medicine

## 2022-03-27 VITALS — BP 113/69 | HR 70 | Temp 98.0°F | Wt 194.2 lb

## 2022-03-27 DIAGNOSIS — Z23 Encounter for immunization: Secondary | ICD-10-CM

## 2022-03-27 DIAGNOSIS — D6862 Lupus anticoagulant syndrome: Secondary | ICD-10-CM

## 2022-03-27 DIAGNOSIS — J449 Chronic obstructive pulmonary disease, unspecified: Secondary | ICD-10-CM

## 2022-03-27 DIAGNOSIS — R7303 Prediabetes: Secondary | ICD-10-CM

## 2022-03-27 LAB — GLUCOSE, CAPILLARY: Glucose-Capillary: 96 mg/dL (ref 70–99)

## 2022-03-27 LAB — POCT GLYCOSYLATED HEMOGLOBIN (HGB A1C): Hemoglobin A1C: 5.1 % (ref 4.0–5.6)

## 2022-03-27 MED ORDER — ALBUTEROL SULFATE HFA 108 (90 BASE) MCG/ACT IN AERS: 2.0000 | INHALATION_SPRAY | Freq: Four times a day (QID) | RESPIRATORY_TRACT | 5 refills | Status: DC | PRN

## 2022-03-27 MED ORDER — SHINGRIX 50 MCG/0.5ML IM SUSR: 0.5000 mL | Freq: Once | INTRAMUSCULAR | 1 refills | Status: AC

## 2022-03-27 NOTE — Progress Notes (Signed)
? ?  CC: evaluation for dental procedures, COPD, need for shingles vaccine ? ?HPI:Mr.TINO RONAN is a 71 y.o. male who presents for evaluation of evaluation for dental procedures, COPD, need for shingles vaccine. Patient has dental procedures scheduled with Dr.Mohamed at [facility]. He will need filling and crowns. He is alos going to need extractions and implants at a future date. I called Dr.Mohamed and shared I believe his chronic respiratory failure puts him at risk for procedures requiring anesthesia. He plan to use local anesthetic for procedures. The second recommendation he is asking for is regarding his anticoagulation. Patient is on Warfarin, and managed in [facility]. Coordinating this with Dr.Groce. Recommendation at this time is not to stop Warfarin. If Dr.Mohamed does not believe bleeding can be controlled locally will discuss additional plans. Please see individual problem based A/P for details. ? ? ?Past Medical History:  ?Diagnosis Date  ? Acute on chronic respiratory failure with hypoxia (HCC)   ? COVID-19 virus infection   ? DVT of lower extremity, bilateral ([place])   ? Epistaxis   ? Hypertension   ? Incarcerated umbilical hernia 21/7981  ? Status post repair  ? Lupus anticoagulant positive   ? Per records from [facility]  ? 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 Was present on CT scan (from [place]) on 05/27/2018  ? Pulmonary artery hypertension ([place]) 04/2011  ? Mildly elevated pulmonary artery pressure in setting of PE (from [facility])  ? Pulmonary embolism associated with COVID-19 ([place])   ? Subdural hematoma   ? Remote episode, occurred when taking excedrin and warfarin.   ? ?Review of Systems:   ?Review of Systems  ?Constitutional:  Negative for chills and fever.  ?Respiratory:  Negative for cough, sputum production and shortness of breath (at baseline 2L supplemental oxygen).    ? ?Physical Exam: ?Vitals:  ? 03/27/22 1505  ?BP: 113/69  ?Pulse: 70  ?Temp: 98 ?F (36.7  ?C)  ?TempSrc: Oral  ?SpO2: 100%  ?Weight: 194 lb 3.2 oz (88.1 kg)  ? ?Physical Exam ?Constitutional:   ?   General: He is not in acute distress. ?   Appearance: He is obese.  ?Cardiovascular:  ?   Rate and Rhythm: Normal rate and regular rhythm.  ?Pulmonary:  ?   Effort: Pulmonary effort is normal.  ?   Breath sounds: No wheezing or rales.  ?Abdominal:  ?   General: Bowel sounds are normal. There is distension.  ?   Palpations: Abdomen is soft.  ?Skin: ?   General: Skin is warm and dry.  ?Neurological:  ?   Mental Status: He is alert.  ?Psychiatric:     ?   Mood and Affect: Mood normal.     ?   Behavior: Behavior normal.  ? ? ? ?Assessment & Plan:  ? ?See Encounters Tab for problem based charting. ? ?Patient discussed with Dr. Jimmye Norman ? ?

## 2022-03-27 NOTE — Patient Instructions (Addendum)
1. Prediabetes ?- POC Hbg A1C ? ?2. Need for shingles vaccine ?- Zoster Vaccine Adjuvanted [facility]) injection; Inject 0.5 mLs into the muscle once for 1 dose.  Dispense: 0.5 mL; Refill: 1 ? ?3. COPD with chronic bronchitis and emphysema (HCC) ?- albuterol (VENTOLIN HFA) 108 (90 Base) MCG/ACT inhaler; Inhale 2 puffs into the lungs every 6 (six) hours as needed for wheezing or shortness of breath.  Dispense: 18 g; Refill: 5 ? ? ? ?I will follow up with Dr.Groce and send recommendations to [facility]  ?

## 2022-03-28 ENCOUNTER — Encounter: Payer: Self-pay | Admitting: Internal Medicine

## 2022-03-28 DIAGNOSIS — D6862 Lupus anticoagulant syndrome: Secondary | ICD-10-CM | POA: Insufficient documentation

## 2022-03-28 DIAGNOSIS — Z23 Encounter for immunization: Secondary | ICD-10-CM | POA: Insufficient documentation

## 2022-03-28 NOTE — Assessment & Plan Note (Signed)
-  refill albuterol ?

## 2022-03-28 NOTE — Assessment & Plan Note (Signed)
Rx sent to pharmacy. ?- Zoster Vaccine Adjuvanted [facility]) injection; Inject 0.5 mLs into the muscle once for 1 dose.  Dispense: 0.5 mL; Refill: 1 ? ?

## 2022-04-06 NOTE — Progress Notes (Signed)
Internal Medicine Clinic Attending ? ?Case discussed with Dr. Court Joy  At the time of the visit.  We reviewed the resident?s history and exam and pertinent patient test results.  I agree with the assessment, diagnosis, and plan of care documented in the resident?s note.  ?

## 2022-04-16 ENCOUNTER — Ambulatory Visit (INDEPENDENT_AMBULATORY_CARE_PROVIDER_SITE_OTHER): Payer: Medicare Other | Admitting: Pharmacist

## 2022-04-16 DIAGNOSIS — I2699 Other pulmonary embolism without acute cor pulmonale: Secondary | ICD-10-CM | POA: Diagnosis not present

## 2022-04-16 DIAGNOSIS — Z86718 Personal history of other venous thrombosis and embolism: Secondary | ICD-10-CM | POA: Diagnosis not present

## 2022-04-16 DIAGNOSIS — Z86711 Personal history of pulmonary embolism: Secondary | ICD-10-CM | POA: Diagnosis not present

## 2022-04-16 DIAGNOSIS — Z7901 Long term (current) use of anticoagulants: Secondary | ICD-10-CM

## 2022-04-16 LAB — POCT INR: INR: 1.5 — AB (ref 2.0–3.0)

## 2022-04-16 NOTE — Patient Instructions (Signed)
Patient instructed to take medications as defined in the Anti-coagulation Track section of this encounter.  ?Patient instructed to OMIT today's dose.  ?Patient instructed, If authorized by your dentist after their procedures on Tuesday April 17, 2022--RESUME warfarin on Wednesday, April 18, 2022 as follows:  Take one-and-one-half (1 & 1/2) of your 57m blue colored warfarin tablets by mouth, once-daily--EXCEPT ON WEDNESDAYS, take TWO (2) tablets on Wednesdays of each week.   ?Patient verbalized understanding of these instructions.   ?

## 2022-04-16 NOTE — Progress Notes (Signed)
Anticoagulation Management [name] is a 71 y.o. male who reports to the clinic for monitoring of warfarin treatment.   ? ?Indication: PE , history of; long term current use of oral anticoagulant warfarin to target INR 2.0 - 3.0. Patient's warfarin has been on HOLD since last administered dose on Thursday April 20th, 2023 [facility] tomorrows dose (Day of dental surgery, Tuesday 25-APR-23). ?Duration: indefinite ?Supervising physician: Aldine Contes ? ?Anticoagulation Clinic Visit History: ?Patient does not report signs/symptoms of bleeding or thromboembolism  ?Other recent changes: No diet, medications, lifestyle except as noted in the patient findings as a recorded above.  ?Anticoagulation Episode Summary   ? ? Current INR goal:  2.0-3.0  ?TTR:  73.4 % (2.4 y)  ?Next INR check:  05/07/2022  ?INR from last check:  1.5 (04/16/2022)  ?Weekly max warfarin dose:    ?Target end date:  Indefinite  ?INR check location:    ?Preferred lab:    ?Send INR reminders to:    ? Indications   ?Long term (current) use of anticoagulants [Z79.01] ?History of DVT (deep vein thrombosis) [Z86.718] ?Pulmonary embolism on long-term anticoagulation therapy ([place]) [I26.99 ?Z79.01] ?History of pulmonary embolism [Z86.711] ? ?  ?  ? ? Comments:    ?  ? ?  ? ? ?No Known Allergies ? ?Current Outpatient Medications:  ?  albuterol (VENTOLIN HFA) 108 (90 Base) MCG/ACT inhaler, Inhale 2 puffs into the lungs every 6 (six) hours as needed for wheezing or shortness of breath., Disp: 18 g, Rfl: 5 ?  alendronate (FOSAMAX) 70 MG tablet, Take 1 tablet (70 mg total) by mouth once a week., Disp: 4 tablet, Rfl: 5 ?  Ascorbic Acid (VITAMIN C) 500 MG CAPS, Take 500 mg by mouth daily., Disp: , Rfl:  ?  cholecalciferol (VITAMIN D3) 25 MCG (1000 UT) tablet, Take 1,000 Units by mouth daily., Disp: , Rfl:  ?  omega-3 acid ethyl esters (LOVAZA) 1 g capsule, Take 1 capsule (1 g total) by mouth 2 (two) times daily., Disp: 180 capsule, Rfl: 1 ?  potassium chloride  (KLOR-CON) 20 MEQ packet, Take 60 mEq by mouth daily., Disp: 90 packet, Rfl: 11 ?  Riociguat (ADEMPAS) 2.5 MG TABS, Take 2.5 mg by mouth in the morning, at noon, and at bedtime., Disp: 270 tablet, Rfl: 3 ?  Selexipag 600 MCG TABS, Take 600 mcg by mouth 2 (two) times daily., Disp: 30 tablet, Rfl: 11 ?  torsemide (DEMADEX) 20 MG tablet, Take 4 tablets (80 mg total) by mouth every morning AND 2 tablets (40 mg total) every evening., Disp: 540 tablet, Rfl: 3 ?  TRELEGY ELLIPTA 200-62.5-25 MCG/INH AEPB, INHALE 1 PUFF INTO THE LUNGS DAILY, Disp: 60 each, Rfl: 3 ?  zinc gluconate 50 MG tablet, Take 50 mg by mouth daily., Disp: , Rfl:  ?  warfarin (COUMADIN) 4 MG tablet, Take 1.5 tablets (6 mg total) by mouth daily at 4 PM. (Patient not taking: Reported on 04/16/2022), Disp: 132 tablet, Rfl: 1 ?Past Medical History:  ?Diagnosis Date  ? Acute on chronic respiratory failure with hypoxia (HCC)   ? COVID-19 virus infection   ? DVT of lower extremity, bilateral ([place])   ? Epistaxis   ? Hypertension   ? Incarcerated umbilical hernia 88/3254  ? Status post repair  ? Lupus anticoagulant positive   ? Per records from [facility]  ? 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 Was present on CT scan (from [place]) on 05/27/2018  ? 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 Mildly elevated pulmonary artery pressure in setting of PE (from [facility])  ? Pulmonary embolism associated with COVID-19 ([place])   ? Subdural hematoma (HCC)   ? Remote episode, occurred when taking excedrin and warfarin.   ? ?Social History  ? ?Socioeconomic History  ? Marital status: Divorced  ?  Spouse name: Not on file  ? Number of children: Not on file  ? Years of education: Not on file  ? Highest education level: Not on file  ?Occupational History  ? Occupation: retired Pharmacist, hospital  ?Tobacco Use  ? Smoking status: Former  ?  Packs/day: 1.50  ?  Years: 23.00  ?  Pack years: 34.50  ?  Types: Cigarettes  ?  Quit date: 1995  ?  Years since quitting: 28.3  ?  Smokeless tobacco: Never  ?Vaping Use  ? Vaping Use: Never used  ?Substance and Sexual Activity  ? Alcohol use: Never  ? Drug use: Not Currently  ?  Types: "Crack" cocaine  ?  Comment: quit 2008  ? Sexual activity: Not on file  ?Other Topics Concern  ? Not on file  ?Social History Narrative  ? Not on file  ? ?Social Determinants of Health  ? ?Financial Resource Strain: Not on file  ?Food Insecurity: Not on file  ?Transportation Needs: Not on file  ?Physical Activity: Not on file  ?Stress: Not on file  ?Social Connections: Not on file  ? ?Family History  ?Adopted: Yes  ? ? ?ASSESSMENT ?Recent Results: ?The most recent result is correlated with Having been HELD since last dose administered on Thursday 20-APR-23.  ?Lab Results  ?Component Value Date  ? INR 1.5 (A) 04/16/2022  ? INR 2.0 03/19/2022  ? INR 2.0 02/13/2022  ? ? ?Anticoagulation Dosing: ?Description   ?If authorized by your dentist after their procedures on Tuesday April 17, 2022--RESUME warfarin on Wednesday, April 18, 2022 as follows:  Take one-and-one-half (1 & 1/2) of your 96m blue colored warfarin tablets by mouth, once-daily--EXCEPT ON WEDNESDAYS, take TWO (2) tablets on Wednesdays of each week.   ?  ?  ?INR today: Subtherapeutic ? ?PLAN ?Weekly dose was deferred at this time. He has been provided instructions to resume his warfarin on Wednesday April 18, 2022. ? ?Patient Instructions  ?Patient instructed to take medications as defined in the Anti-coagulation Track section of this encounter.  ?Patient instructed to OMIT today's dose.  ?Patient instructed, If authorized by your dentist after their procedures on Tuesday April 17, 2022--RESUME warfarin on Wednesday, April 18, 2022 as follows:  Take one-and-one-half (1 & 1/2) of your 430mblue colored warfarin tablets by mouth, once-daily--EXCEPT ON WEDNESDAYS, take TWO (2) tablets on Wednesdays of each week.   ?Patient verbalized understanding of these instructions.   ?Patient advised to contact clinic or  seek medical attention if signs/symptoms of bleeding or thromboembolism occur. ? ?Patient verbalized understanding by repeating back information and was advised to contact me if further medication-related questions arise. Patient was also provided an information handout. ? ?Follow-up ?Return in 3 weeks (on 05/07/2022) for Follow up INR. ? ?JaPennie BanterPharmD, CPP ? ?15 minutes spent face-to-face with the patient during the encounter. 50% of time spent on education, including signs/sx bleeding and clotting, as well as food and drug interactions with warfarin. 50% of time was spent on fingerprick POC INR sample collection,processing, results determination, and documentation in [email or web address] ?

## 2022-04-18 NOTE — Progress Notes (Signed)
INTERNAL MEDICINE TEACHING ATTENDING ADDENDUM - Jesus Russell M.D  ?Duration- indefinite, Indication- recurrent PE, INR- subtherapeutic ( on hold secondary  to dental procedure) . Agree with pharmacy recommendations as outlined in their note.  ? ? ? ?

## 2022-04-19 ENCOUNTER — Ambulatory Visit (INDEPENDENT_AMBULATORY_CARE_PROVIDER_SITE_OTHER): Payer: Medicare Other | Admitting: Internal Medicine

## 2022-04-19 ENCOUNTER — Encounter: Payer: Self-pay | Admitting: Internal Medicine

## 2022-04-19 VITALS — BP 136/76 | HR 87 | Temp 97.4°F | Ht 65.0 in | Wt 194.8 lb

## 2022-04-19 DIAGNOSIS — D860 Sarcoidosis of lung: Secondary | ICD-10-CM

## 2022-04-19 DIAGNOSIS — G4733 Obstructive sleep apnea (adult) (pediatric): Secondary | ICD-10-CM | POA: Diagnosis not present

## 2022-04-19 NOTE — Progress Notes (Signed)
? ?      ?CAIO DEVERA    [id number]    [birth year] ? ?Primary Care Physician:Steen, Jeneen Rinks, MD ?Date of Appointment: 04/19/2022 ?Established Patient Visit ? ?Chief complaint:   ?Chief Complaint  ?Patient presents with  ? Follow-up  ?  SOB unchanged   ? ? ? ?HPI: ?Jesus Russell is a 71 y.o. 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 ?Interval Updates: ?Here for follow up today for chronic sarcoidosis and sleep anea. ? ?Is now on 3LNC.  ? 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 Aundra Dubin, on adempas and Malvin Johns ? ?CPAP download reviewed and he has good adherence with mild residual AHI 8.9 events/minute. Does have some leak.  ? ?Has chronic PE and is on warfarin also has had IVC filter placed.  ? ?Trelegy maintenance inhaler ? ?Has been trying to lose weight. And has lost a little, but feels he has more to go.  ? ?Denies any new sarcoid symptoms - eyes, skin.  ? ?I have reviewed the patient's family social and past medical history and updated as appropriate.  ? ?Past Medical History:  ?Diagnosis Date  ? Acute on chronic respiratory failure with hypoxia (HCC)   ? COVID-19 virus infection   ? DVT of lower extremity, bilateral ([place])   ? Epistaxis   ? Hypertension   ? Incarcerated umbilical hernia 50/9326  ? Status post repair  ? Lupus anticoagulant positive   ? Per records from [facility]  ? 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 Was present on CT scan (from [place]) on 05/27/2018  ? Pulmonary artery hypertension ([place]) 04/2011  ? Mildly elevated pulmonary artery pressure in setting of PE (from [facility])  ? Pulmonary embolism associated with COVID-19 ([place])   ? Subdural hematoma (HCC)   ? Remote episode, occurred when taking excedrin and warfarin.   ? ? ?Past Surgical History:  ?Procedure Laterality Date  ? BRAIN SURGERY    ? HERNIA REPAIR    ? IR KYPHO EA ADDL LEVEL THORACIC OR  LUMBAR  05/04/2020  ? IR KYPHO EA ADDL LEVEL THORACIC OR LUMBAR  05/04/2020  ? IR KYPHO THORACIC WITH BONE BIOPSY  05/04/2020  ? IR RADIOLOGIST EVAL & MGMT  04/07/2020  ? IR RADIOLOGIST EVAL & MGMT  06/08/2020  ? NASAL ENDOSCOPY WITH EPISTAXIS CONTROL N/A 02/24/2020  ? Procedure: NASAL ENDOSCOPY WITH EPISTAXIS CONTROL;  Surgeon: [name], MD;  Location: [facility];  Service: ENT;  Laterality: N/A;  ? RIGHT HEART CATH N/A 07/06/2020  ? Procedure: RIGHT HEART CATH;  Surgeon: [name], MD;  Location: [facility];  Service: Cardiovascular;  Laterality: N/A;  ? ? ?Family History  ?Adopted: Yes  ? ? ?Social History  ? ?Occupational History  ? Occupation: retired Pharmacist, hospital  ?Tobacco Use  ? Smoking status: Former  ?  Packs/day: 1.50  ?  Years: 23.00  ?  Pack years: 34.50  ?  Types: Cigarettes  ?  Quit date: 1995  ?  Years since quitting: 28.3  ? Smokeless tobacco: Never  ?Vaping Use  ? Vaping Use: Never used  ?Substance and Sexual Activity  ? Alcohol use: Never  ? Drug use: Not Currently  ?  Types: "Crack" cocaine  ?  Comment: quit 2008  ? Sexual activity: Not on file  ? ? ? ?Physical Exam: ?  Blood pressure 136/76, pulse 87, temperature (!) 97.4 ?F (36.3 ?C), temperature source Oral, height _0  (1.651 m), weight 194 lb 12.8 oz (88.4 kg), SpO2 97 %. ? ?Gen:      No acute distress ?Lungs:    No increased respiratory effort, symmetric chest wall excursion,diminished bilaterally,no wheezes or crackles ?CV:         Regular rate and rhythm; no murmurs, rubs, or gallops.  No pedal edema ? ? 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 ?PFTs: ?PFTs (04/26/2016): ?Findings (summarized): ?* Moderately reduced FVC 2.15 L, 47% of predicted.  After ministration of albuterol, the FVC is 1.97 L, 43% predicted ?* Reduced FEV1/FVC of 66%. ?* Reduced FEV1 1.43 L, 45% predicted. ?Impression: There is moderate combined  obstructive/restrictive ventilatory defect.  There is no clear improvement with administration of a bronchodilator.  There is relative residual hyperinflation.  The reduced ERV is likely due to body habitus.  The diffusing capacity is mildly reduced, but normalizes after correcting for alveolar volume. ?  ?PFTs (04/02/2018): ?Findings (summarized): ?*FVC is moderately reduced, 2.01 L, 49% predicted ?*FEV1 moderately reduced, 1.41 L, 49% predicted ?*FEV1/FVC is normal, 70% ?*After bronchodilator the FVC is 58% predicted, 19% increase, FEV1 is 51% predicted, FEV1/FVC mildly reduced, 61%. ? ?Labs: ? ?Immunization status: ?Immunization History  ?Administered Date(s) Administered  ? Fluad Quad(high Dose 65+) 09/21/2020, 09/04/2021  ? Influenza,inj,Quad PF,6+ Mos 09/09/2019  ? PFIZER(Purple Top)SARS-COV-2 Vaccination 04/09/2020, 05/02/2020, 11/25/2020  ? Pneumococcal Conjugate-13 02/06/2021  ? ? 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 ?Plan/Recommendations: ?Subjectively stable sarcoid symptoms. Radiographically his sarcoid is burned out and fibrotic and unlikely to respond to therapy. We can get PFTs at next visit if he perceives a worsening of symptoms.  ?For his pulmonary hypertension he is being seen by advanced heart failure and is on pulmonary vasodilators. ?Continue coumadin life long ?Continue trelegy and albuterol prn for component of COPD.  ?CPAP download reviewed he has excellent adherence with reasonable suppression of AHI. Continue current settings.  ? ? ?Return to Care: ?Return in about 6 months (around 10/19/2022). ? ? ?Lenice Llamas, MD ?Pulmonary and Critical Care Medicine ?[place] ?[phone] ? ? ? ? ? ?

## 2022-04-19 NOTE — Patient Instructions (Addendum)
Please schedule follow up scheduled with myself in 6 months.  If my schedule is not open yet, we will contact you with a reminder closer to that time. Please call [phone] if you haven't heard from [place] a month before.  ? ?Continue trelegy inhaler with albuterol as needed as long as you perceive benefit.  ? ?Sleep apnea device: [email or web address] ?

## 2022-04-26 ENCOUNTER — Encounter (HOSPITAL_COMMUNITY): Payer: Medicare Other | Admitting: Cardiology

## 2022-04-26 ENCOUNTER — Other Ambulatory Visit (HOSPITAL_COMMUNITY): Payer: Medicare Other

## 2022-04-27 ENCOUNTER — Encounter (HOSPITAL_COMMUNITY): Payer: Medicare Other | Admitting: Cardiology

## 2022-04-27 ENCOUNTER — Ambulatory Visit (HOSPITAL_COMMUNITY)
Admission: RE | Admit: 2022-04-27 | Discharge: 2022-04-27 | Disposition: A | Discharge status: Home / Self Care | Payer: Medicare Other | Source: Ambulatory Visit | Attending: Cardiology | Admitting: Cardiology

## 2022-04-27 DIAGNOSIS — I110 Hypertensive heart disease with heart failure: Secondary | ICD-10-CM | POA: Insufficient documentation

## 2022-04-27 DIAGNOSIS — I50812 Chronic right heart failure: Secondary | ICD-10-CM | POA: Insufficient documentation

## 2022-04-27 LAB — ECHOCARDIOGRAM COMPLETE
Area-P 1/2: 3.23 cm2
Calc EF: 56 %
S' Lateral: 2.8 cm
Single Plane A2C EF: 46.7 %
Single Plane A4C EF: 63.9 %

## 2022-05-02 ENCOUNTER — Other Ambulatory Visit: Payer: Self-pay | Admitting: Pharmacist

## 2022-05-02 ENCOUNTER — Encounter (HOSPITAL_COMMUNITY): Payer: Self-pay | Admitting: Cardiology

## 2022-05-02 ENCOUNTER — Ambulatory Visit (HOSPITAL_COMMUNITY)
Admission: RE | Admit: 2022-05-02 | Discharge: 2022-05-02 | Disposition: A | Discharge status: Home / Self Care | Payer: Medicare Other | Source: Ambulatory Visit | Attending: Cardiology | Admitting: Cardiology

## 2022-05-02 VITALS — BP 94/50 | HR 73 | Wt 193.6 lb

## 2022-05-02 DIAGNOSIS — Z7901 Long term (current) use of anticoagulants: Secondary | ICD-10-CM | POA: Diagnosis not present

## 2022-05-02 DIAGNOSIS — D869 Sarcoidosis, unspecified: Secondary | ICD-10-CM | POA: Diagnosis not present

## 2022-05-02 DIAGNOSIS — Z86711 Personal history of pulmonary embolism: Secondary | ICD-10-CM | POA: Diagnosis not present

## 2022-05-02 DIAGNOSIS — D6862 Lupus anticoagulant syndrome: Secondary | ICD-10-CM | POA: Diagnosis not present

## 2022-05-02 DIAGNOSIS — I272 Pulmonary hypertension, unspecified: Secondary | ICD-10-CM

## 2022-05-02 DIAGNOSIS — I509 Heart failure, unspecified: Secondary | ICD-10-CM | POA: Insufficient documentation

## 2022-05-02 DIAGNOSIS — I50812 Chronic right heart failure: Secondary | ICD-10-CM

## 2022-05-02 DIAGNOSIS — M2550 Pain in unspecified joint: Secondary | ICD-10-CM | POA: Insufficient documentation

## 2022-05-02 DIAGNOSIS — G4733 Obstructive sleep apnea (adult) (pediatric): Secondary | ICD-10-CM | POA: Diagnosis not present

## 2022-05-02 DIAGNOSIS — I110 Hypertensive heart disease with heart failure: Secondary | ICD-10-CM | POA: Insufficient documentation

## 2022-05-02 LAB — BASIC METABOLIC PANEL
Anion gap: 5 (ref 5–15)
BUN: 13 mg/dL (ref 8–23)
CO2: 35 mmol/L — ABNORMAL HIGH (ref 22–32)
Calcium: 9.5 mg/dL (ref 8.9–10.3)
Chloride: 98 mmol/L (ref 98–111)
Creatinine, Ser: 0.95 mg/dL (ref 0.61–1.24)
GFR, Estimated: >60 mL/min (ref >60)
Glucose, Bld: 94 mg/dL (ref 70–99)
Potassium: 4.4 mmol/L (ref 3.5–5.1)
Sodium: 138 mmol/L (ref 135–145)

## 2022-05-02 LAB — BRAIN NATRIURETIC PEPTIDE: B Natriuretic Peptide: 45 pg/mL (ref 0.0–100.0)

## 2022-05-02 MED ORDER — WARFARIN SODIUM 4 MG PO TABS: ORAL_TABLET | ORAL | 1 refills | Status: DC

## 2022-05-02 NOTE — Telephone Encounter (Signed)
Patient calls requesting refill authorization on his warfarin. Will send the prescription refill electronically. Currently taking 1&1/2 of his 60m strength, blue warfarin tablets all days of the week--EXCEPT on Wednesday, he takes two (2) tablets. Sent a 90 day supply.  ?

## 2022-05-02 NOTE — Patient Instructions (Signed)
EKG done today. ? ?Labs done today. We will contact you only if your labs are abnormal. ? ?No medication changes were made. Please continue all current medications as prescribed. ? ?Your physician recommends that you schedule a follow-up appointment in: 3 months ? ?If you have any questions or concerns before your next appointment please send [place] a message through [place] or call our office at [phone].   ? ?TO LEAVE A MESSAGE FOR THE NURSE SELECT OPTION 2, PLEASE LEAVE A MESSAGE INCLUDING: ?YOUR NAME ?DATE OF BIRTH ?CALL BACK NUMBER ?REASON FOR CALL**this is important as we prioritize the call backs ? ?YOU WILL RECEIVE A CALL BACK THE SAME DAY AS LONG AS YOU CALL BEFORE 4:00 PM ? ? ?Do the following things EVERYDAY: ?Weigh yourself in the morning before breakfast. Write it down and keep it in a log. ?Take your medicines as prescribed ?Eat low salt foods--Limit salt (sodium) to 2000 mg per day.  ?Stay as active as you can everyday ?Limit all fluids for the day to less than 2 liters ? ? ?At the [facility], you and your health needs are our priority. As part of our continuing mission to provide you with exceptional heart care, we have created designated Provider Care Teams. These Care Teams include your primary Cardiologist (physician) and Advanced Practice Providers (APPs- Physician Assistants and Nurse Practitioners) who all work together to provide you with the care you need, when you need it.  ? ?You may see any of the following providers on your designated Care Team at your next follow up: ?Dr Glori Bickers ?Dr Loralie Champagne ?Darrick Grinder, NP ?Lyda Jester, PA ?Audry Riles, PharmD ? ? ?Please be sure to bring in all your medications bottles to every appointment.  ? ? ?You are scheduled for a Cardiac Catheterization on Friday, May 19 with Dr. Loralie Champagne. ? ?1. Please arrive at the Main Entrance A at [facility]: [place], [place] [address] at 11:30 AM (This time  is two hours before your procedure to ensure your preparation). Free valet parking service is available.  ? ?Special note: Every effort is made to have your procedure done on time. Please understand that emergencies sometimes delay scheduled procedures. ? ?2. Diet: Do not eat solid foods after midnight.  You may have clear liquids until 5 AM upon the day of the procedure. ? ?3. Medication instructions in preparation for your procedure: ? ?On the day of your procedure hold your Torsemide.  ? ?On the morning of your procedure, take any morning medicines NOT listed above.  You may use sips of water. ? ?4. Plan to go home the same day, you will only stay overnight if medically necessary. ?5. You MUST have a responsible adult to drive you home. ?6. An adult MUST be with you the first 24 hours after you arrive home. ?7. Bring a current list of your medications, and the last time and date medication taken. ?8. Bring ID and current insurance cards. ?9.Please wear clothes that are easy to get on and off and wear slip-on shoes. ? ?Thank you for allowing [place] to care for you! ?  -- [facility] ? ?

## 2022-05-03 NOTE — Progress Notes (Signed)
PCP: Madalyn Rob, MD ?[facility]iology: Dr. Aundra Dubin ? ?71 y.o. with history of sarcoidosis, prior PE, RV failure was referred by Dr. Shearon Stalls for evaluation of CHF/pulmonary hypertension.  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  Patient was on prednisone for a period of time, this was stopped earlier this year.  He is on 2 L home oxygen.  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 He has had recurrent PEs/DVTs.  He has an IVC filter.  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  Most recent PE was in 2/21 when he had been off warfarin transiently.  Last echo in 5/21 showed EF 55-60% with D-shaped septum and severely dilated/severely dysfunctional RV.  No estimation of PA pressure was available.   ? ?[place] in 7/21 showed normal PCWP and moderate PAH with PVR 5 WU. V/Q showed perfusion defects but thought to be due to sarcoidosis.  CTA chest in 7/21 showed no acute or chronic PE, signs of chronic sarcoidosis were present.  ? ?Echo (5/22) with EF 55-60%, moderate focal basal septal hypertrophy, severe RV enlargement with moderately decreased systolic function.  ? ?Patient has had to cut back on Uptravi due to joint pain and fatigue.  He is currently taking 600 mcg bid.  He stopped Opsumit due to severe peripheral edema. He tried restarting it, and the edema came back, so he stopped it for good.  ? ?Echo in 5/23 showed EF 55-60%, moderate RV enlargement with moderate RV dysfunction, PASP 57 mmHg.  ? ?Patient presents for followup of pulmonary hypertension/RV failure.  He is now using CPAP and 2-3 L home oxygen.  Breathing is stable.  No dyspnea walking in house.  He gets short of breath walking up stairs. Uses rollator for balance.  No chest pain.  No orthopnea/PND.  Weight up 3 lbs.  ? ?ECG (personally reviewed): NSR, RBBB ? ?Labs (6/21): pro-BNP 43, K 4.6, creatinine 0.89 ?Labs (8/21): hgb 10.5, K 3.8, creatinine 1.28 ?Labs (10/21): K 5.4, creatinine  1.1 ?Labs (11/21): K 3.3, creatinine 1.14 ?Labs (1/22): BNP 365 ?Labs (2/22): K 4, creatinine 1.2 ?Labs (3/22): K 3.9, creatinine 1.49, BNP 21 ?Labs (6/22): BNP 23, K 5, creatinine 1.33 ?Labs (10/22): K 4, creatinine 1.25, BNP 48 ?Labs (2/23): K 4.6, creatinine 1.29 ? ?6 minute walk (11/21): 76 m ?6 minute walk (1/22): 183 m ?6 minute walk (3/22): 198 m ?6 minute walk (5/22): 183 m ?6 minute walk (10/22): 152 m ?6 minute walk (1/23): 229 m ? ?PMH: ?1. Sarcoidosis: "Burned out."  Diagnosed in 1990 with bronchoscopy/biopsy.  Treated with prednisone, now off.  ?- On 2 L home oxygen chronically.  ?2. 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 H/o DVT and PE.  Has lupus anticoagulant.   ?- Most recent PE was in 2/21, he had been off warfarin transiently.  ?- V/Q scan in 7/21 with perfusion defects, CTA in 7/21 showed no acute or chronic thrombi, signs of sarcoidosis were present.  ?4. COVID-19 infection 1/21.  ?5. H/o compression fractures in 2/21.  ?- Had kyphoplasty ?6. RV failure/pulmonary hypertension:  ?- Echo (5/21) with EF 55-60%, D-shaped interventricular septum, severely dilated RV with severe systolic dysfunction, dilated IVC.  ?- RHC (7/21) with mean RA 9, PA 62/21 mean 39, mean PCWP 11, CI 2.64, PVR 5 WU.  ?- Did not tolerate Tyvaso.  ?- Echo (5/22): EF 55-60%, moderate focal basal septal hypertrophy, severe RV enlargement with moderately decreased systolic function.  ?-  Unable to tolerate Opsumit.  ?- Echo (5/23): EF 55-60%, moderate RV enlargement with moderate RV dysfunction, PASP 57 mmHg.  ?7. HTN ?8. History of subdural hematoma remotely.  ? ?Social History  ? ?Socioeconomic History  ? Marital status: Divorced  ?  Spouse name: Not on file  ? Number of children: Not on file  ? Years of education: Not on file  ? Highest education level: Not on file  ?Occupational History  ? Occupation: retired Pharmacist, hospital  ?Tobacco Use  ? Smoking status: Former  ?  Packs/day: 1.50  ?  Years: 23.00  ?  Pack years:  34.50  ?  Types: Cigarettes  ?  Quit date: 1995  ?  Years since quitting: 28.3  ? Smokeless tobacco: Never  ?Vaping Use  ? Vaping Use: Never used  ?Substance and Sexual Activity  ? Alcohol use: Never  ? Drug use: Not Currently  ?  Types: "Crack" cocaine  ?  Comment: quit 2008  ? Sexual activity: Not on file  ?Other Topics Concern  ? Not on file  ?Social History Narrative  ? Not on file  ? ?Social Determinants of Health  ? ?Financial Resource Strain: Not on file  ?Food Insecurity: Not on file  ?Transportation Needs: Not on file  ?Physical Activity: Not on file  ?Stress: Not on file  ?Social Connections: Not on file  ?Intimate Partner Violence: Not on file  ? ?Family History  ?Adopted: Yes  ? ?ROS: All systems reviewed and negative except as per HPI.  ? ?Current Outpatient Medications  ?Medication Sig Dispense Refill  ? albuterol (VENTOLIN HFA) 108 (90 Base) MCG/ACT inhaler Inhale 2 puffs into the lungs every 6 (six) hours as needed for wheezing or shortness of breath. 18 g 5  ? alendronate (FOSAMAX) 70 MG tablet Take 1 tablet (70 mg total) by mouth once a week. 4 tablet 5  ? Ascorbic Acid (VITAMIN C) 500 MG CAPS Take 500 mg by mouth daily.    ? cholecalciferol (VITAMIN D3) 25 MCG (1000 UT) tablet Take 1,000 Units by mouth daily.    ? omega-3 acid ethyl esters (LOVAZA) 1 g capsule Take 1 capsule (1 g total) by mouth 2 (two) times daily. 180 capsule 1  ? potassium chloride (KLOR-CON) 20 MEQ packet Take 60 mEq by mouth daily. 90 packet 11  ? Riociguat (ADEMPAS) 2.5 MG TABS Take 2.5 mg by mouth in the morning, at noon, and at bedtime. 270 tablet 3  ? Selexipag 600 MCG TABS Take 600 mcg by mouth 2 (two) times daily. 30 tablet 11  ? torsemide (DEMADEX) 20 MG tablet Take 4 tablets (80 mg total) by mouth every morning AND 2 tablets (40 mg total) every evening. 540 tablet 3  ? TRELEGY ELLIPTA 200-62.5-25 MCG/INH AEPB INHALE 1 PUFF INTO THE LUNGS DAILY 60 each 3  ? warfarin (COUMADIN) 4 MG tablet Take one-and-one-half (1&1/2)  tablets all days of the week--EXCEPT on Wednesdays, take two (2) tablets of your warfarin at [facility] each day. 132 tablet 1  ? zinc gluconate 50 MG tablet Take 50 mg by mouth daily.    ? ?No current facility-administered medications for this encounter.  ? ?BP (!) 94/50   Pulse 73   Wt 87.8 kg (193 lb 9.6 oz)   SpO2 95% Comment: 3l n/c  BMI 32.22 kg/m?  ?General: NAD, kyphotic ?Neck: JVP 8 cm, no thyromegaly or thyroid nodule.  ?Lungs: Distant BS ?CV: Nondisplaced PMI.  Heart regular S1/S2 with widely split S2,  no S3/S4, no murmur.  No peripheral edema.  No carotid bruit.  Normal pedal pulses.  ?Abdomen: Soft, nontender, no hepatosplenomegaly, no distention.  ?Skin: Intact without lesions or rashes.  ?Neurologic: Alert and oriented x 3.  ?Psych: Normal affect. ?Extremities: No clubbing or cyanosis.  ?HEENT: Normal.  ? ?Assessment/Plan: ?1. RV failure, pulmonary hypertension: Echo in 5/21 showed EF 55-60%, D-shaped septum with severe RV dilation/severe RV dysfunction.  [place] in 7/21 showed mildly elevated RA pressure, moderate PAH with PVR 5 WU.  V/Q scan showed perfusion defects, but CTA chest in 7/21 showed no acute or chronic PE, V/Q findings were likely due to chronic sarcoidosis changes in lungs.  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  Echo in 5/22 showed mildly improved RV function compared to 5/21. Echo in 5/23 showed moderate RV dysfunction and moderate RV enlargement, PASP 57 mmHg.  He did not tolerate Tyvaso. He is now off Opsumit (cannot tolerate due to swelling).  Minimal volume overload on exam.  NYHA class III symptoms chronically.  ?- Continue torsemide 80 qam/40 qpm.  BMET/BNP today.  ?- Off Opsumit, unable to tolerate.  ?- Continue Adempas 2.5 mg tid.   ?- He has been unable to increase Uptravi beyond 600 mcg bid due to joint pain.  ?- Continue home oxygen and CPAP.  ?- Defer 6 minute walk today, he does not have his rollator.  ?- Exam is difficult for  volume, echo in 5/23 showed ongoing RV dysfunction.  I would like to repeat RHC to assess filling pressures and also reassess PA pressure on treatment. We discussed risks/benefits and he agreed to proced

## 2022-05-07 ENCOUNTER — Ambulatory Visit: Payer: PRIVATE HEALTH INSURANCE

## 2022-05-09 ENCOUNTER — Ambulatory Visit (INDEPENDENT_AMBULATORY_CARE_PROVIDER_SITE_OTHER): Payer: Medicare Other | Admitting: Pharmacist

## 2022-05-09 DIAGNOSIS — Z86711 Personal history of pulmonary embolism: Secondary | ICD-10-CM | POA: Diagnosis not present

## 2022-05-09 DIAGNOSIS — Z7901 Long term (current) use of anticoagulants: Secondary | ICD-10-CM | POA: Diagnosis present

## 2022-05-09 DIAGNOSIS — Z86718 Personal history of other venous thrombosis and embolism: Secondary | ICD-10-CM | POA: Diagnosis not present

## 2022-05-09 DIAGNOSIS — I2699 Other pulmonary embolism without acute cor pulmonale: Secondary | ICD-10-CM

## 2022-05-09 LAB — POCT INR: INR: 2.7 (ref 2.0–3.0)

## 2022-05-09 NOTE — Progress Notes (Signed)
Anticoagulation Management ?Jesus Russell is a 71 y.o. male who reports to the clinic for monitoring of warfarin treatment.   ? ?Indication: PE, with history of infarction, DVT, history of; Long term current use of oral anticoagulant warfarin for target INR 2.0 - 3.0. ?Duration: indefinite ?Supervising physician: Joni Reining ? ?Anticoagulation Clinic Visit History: ?Patient does not report signs/symptoms of bleeding or thromboembolism  ?Other recent changes: No diet, medications, lifestyle changes endorsed by the patient at this visit.  ?Anticoagulation Episode Summary   ? ? Current INR goal:  2.0-3.0  ?TTR:  73.0 % (2.5 y)  ?Next INR check:  06/06/2022  ?INR from last check:  2.7 (05/09/2022)  ?Weekly max warfarin dose:    ?Target end date:  Indefinite  ?INR check location:    ?Preferred lab:    ?Send INR reminders to:    ? Indications   ?Long term (current) use of anticoagulants [Z79.01] ?History of DVT (deep vein thrombosis) [Z86.718] ?Pulmonary embolism on long-term anticoagulation therapy ([place]) [I26.99 ?Z79.01] ?History of pulmonary embolism [Z86.711] ? ?  ?  ? ? Comments:    ?  ? ?  ? ? ?No Known Allergies ? ?Current Outpatient Medications:  ?  albuterol (VENTOLIN HFA) 108 (90 Base) MCG/ACT inhaler, Inhale 2 puffs into the lungs every 6 (six) hours as needed for wheezing or shortness of breath., Disp: 18 g, Rfl: 5 ?  alendronate (FOSAMAX) 70 MG tablet, Take 1 tablet (70 mg total) by mouth once a week., Disp: 4 tablet, Rfl: 5 ?  Ascorbic Acid (VITAMIN C) 500 MG CAPS, Take 500 mg by mouth daily., Disp: , Rfl:  ?  cholecalciferol (VITAMIN D3) 25 MCG (1000 UT) tablet, Take 1,000 Units by mouth daily., Disp: , Rfl:  ?  omega-3 acid ethyl esters (LOVAZA) 1 g capsule, Take 1 capsule (1 g total) by mouth 2 (two) times daily., Disp: 180 capsule, Rfl: 1 ?  Polyethyl Glycol-Propyl Glycol (SYSTANE OP), Place 1-2 drops into both eyes as needed (Dry eyes)., Disp: , Rfl:  ?  potassium chloride (KLOR-CON) 20 MEQ packet, Take  60 mEq by mouth daily., Disp: 90 packet, Rfl: 11 ?  Riociguat (ADEMPAS) 2.5 MG TABS, Take 2.5 mg by mouth in the morning, at noon, and at bedtime., Disp: 270 tablet, Rfl: 3 ?  Selexipag 600 MCG TABS, Take 600 mcg by mouth 2 (two) times daily., Disp: 30 tablet, Rfl: 11 ?  torsemide (DEMADEX) 20 MG tablet, Take 4 tablets (80 mg total) by mouth every morning AND 2 tablets (40 mg total) every evening., Disp: 540 tablet, Rfl: 3 ?  TRELEGY ELLIPTA 200-62.5-25 MCG/INH AEPB, INHALE 1 PUFF INTO THE LUNGS DAILY, Disp: 60 each, Rfl: 3 ?  warfarin (COUMADIN) 4 MG tablet, Take one-and-one-half (1&1/2) tablets all days of the week--EXCEPT on Wednesdays, take two (2) tablets of your warfarin at [facility] each day. (Patient taking differently: Take 6-8 mg by mouth See admin instructions. Take one-and-one-half (1&1/2) tablets all days of the week--EXCEPT on Wednesdays, take two (2) tablets of your warfarin at [facility] each day.), Disp: 132 tablet, Rfl: 1 ?  zinc gluconate 50 MG tablet, Take 50 mg by mouth daily., Disp: , Rfl:  ?Past Medical History:  ?Diagnosis Date  ? Acute on chronic respiratory failure with hypoxia (HCC)   ? COVID-19 virus infection   ? DVT of lower extremity, bilateral ([place])   ? Epistaxis   ? Hypertension   ? Incarcerated umbilical hernia [id number]/7408  ? Status post repair  ?  Lupus anticoagulant positive   ? Per records from [facility]  ? 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 Was present on CT scan (from [place]) on 05/27/2018  ? Pulmonary artery hypertension ([place]) 04/2011  ? Mildly elevated pulmonary artery pressure in setting of PE (from [facility])  ? Pulmonary embolism associated with COVID-19 ([place])   ? Subdural hematoma (HCC)   ? Remote episode, occurred when taking excedrin and warfarin.   ? ?Social History  ? ?Socioeconomic History  ? Marital status: Divorced  ?  Spouse name: Not on file  ? Number of children: Not on file  ? Years of education: Not on file  ? Highest education level: Not on file   ?Occupational History  ? Occupation: retired Pharmacist, hospital  ?Tobacco Use  ? Smoking status: Former  ?  Packs/day: 1.50  ?  Years: 23.00  ?  Pack years: 34.50  ?  Types: Cigarettes  ?  Quit date: 1995  ?  Years since quitting: 28.3  ? Smokeless tobacco: Never  ?Vaping Use  ? Vaping Use: Never used  ?Substance and Sexual Activity  ? Alcohol use: Never  ? Drug use: Not Currently  ?  Types: "Crack" cocaine  ?  Comment: quit 2008  ? Sexual activity: Not on file  ?Other Topics Concern  ? Not on file  ?Social History Narrative  ? Not on file  ? ?Social Determinants of Health  ? ?Financial Resource Strain: Not on file  ?Food Insecurity: Not on file  ?Transportation Needs: Not on file  ?Physical Activity: Not on file  ?Stress: Not on file  ?Social Connections: Not on file  ? ?Family History  ?Adopted: Yes  ? ? ?ASSESSMENT ?Recent Results: ?The most recent result is correlated with 44 mg per week: ?Lab Results  ?Component Value Date  ? INR 2.7 05/09/2022  ? INR 1.5 (A) 04/16/2022  ? INR 2.0 03/19/2022  ? ? ?Anticoagulation Dosing: ?Description   ?Take one-and-one-half (1 & 1/2) of your 78m blue colored warfarin tablets by mouth, once-daily--EXCEPT ON WEDNESDAYS, take TWO (2) tablets on Wednesdays of each week.   ?  ?  ?INR today: Therapeutic ? ?PLAN ?Weekly dose was unchanged. ? ?Patient Instructions  ?Patient instructed to take medications as defined in the Anti-coagulation Track section of this encounter.  ?Patient instructed to take today's dose.  ?Patient instructed to take one-and-one-half (1 & 1/2) of your 426mblue colored warfarin tablets by mouth, once-daily--EXCEPT ON WEDNESDAYS, take TWO (2) tablets on Wednesdays of each week.   ?Patient verbalized understanding of these instructions.   ?Patient advised to contact clinic or seek medical attention if signs/symptoms of bleeding or thromboembolism occur. ? ?Patient verbalized understanding by repeating back information and was advised to contact me if further  medication-related questions arise. Patient was also provided an information handout. ? ?Follow-up ?Return in 4 weeks (on 06/06/2022). ? ?JaPennie BanterPharmD, CPP ? ?15 minutes spent face-to-face with the patient during the encounter. 50% of time spent on education, including signs/sx bleeding and clotting, as well as food and drug interactions with warfarin. 50% of time was spent on fingerprick POC INR sample collection,processing, results determination, and documentation in [email or web address] ?

## 2022-05-09 NOTE — Patient Instructions (Signed)
Patient instructed to take medications as defined in the Anti-coagulation Track section of this encounter.  ?Patient instructed to take today's dose.  ?Patient instructed to take one-and-one-half (1 & 1/2) of your 23m blue colored warfarin tablets by mouth, once-daily--EXCEPT ON WEDNESDAYS, take TWO (2) tablets on Wednesdays of each week.   ?Patient verbalized understanding of these instructions.   ?

## 2022-05-11 ENCOUNTER — Ambulatory Visit (HOSPITAL_COMMUNITY): Admission: RE | Admit: 2022-05-11 | Payer: Medicare Other | Source: Home / Self Care | Admitting: Cardiology

## 2022-05-11 ENCOUNTER — Encounter (HOSPITAL_COMMUNITY): Admission: RE | Payer: Self-pay | Source: Home / Self Care

## 2022-05-11 SURGERY — RIGHT HEART CATH
Anesthesia: LOCAL

## 2022-05-15 NOTE — Progress Notes (Signed)
Evaluation and management procedures were performed by the Clinical Pharmacy Practitioner under my supervision and collaboration. I have reviewed the Practitioner's note and chart, and I agree with the management and plan as documented above. ° °

## 2022-05-16 ENCOUNTER — Other Ambulatory Visit (HOSPITAL_COMMUNITY): Payer: Self-pay | Admitting: *Deleted

## 2022-05-16 MED ORDER — OMEGA-3-ACID ETHYL ESTERS 1 G PO CAPS: 1.0000 | ORAL_CAPSULE | Freq: Two times a day (BID) | ORAL | 1 refills | Status: DC

## 2022-05-17 ENCOUNTER — Encounter (HOSPITAL_COMMUNITY): Payer: Self-pay | Admitting: Cardiology

## 2022-05-17 ENCOUNTER — Other Ambulatory Visit: Payer: Self-pay

## 2022-05-17 DIAGNOSIS — M8080XA Other osteoporosis with current pathological fracture, unspecified site, initial encounter for fracture: Secondary | ICD-10-CM

## 2022-05-17 MED ORDER — ALENDRONATE SODIUM 70 MG PO TABS: 70.0000 mg | ORAL_TABLET | ORAL | 5 refills | Status: AC

## 2022-05-18 ENCOUNTER — Telehealth (HOSPITAL_COMMUNITY): Payer: Self-pay

## 2022-05-18 NOTE — Telephone Encounter (Signed)
Attempted to reach patient,VM left to call office back to reschedule right heart catheterization.

## 2022-06-01 ENCOUNTER — Telehealth (HOSPITAL_COMMUNITY): Payer: Self-pay

## 2022-06-01 ENCOUNTER — Encounter (HOSPITAL_COMMUNITY): Payer: Self-pay

## 2022-06-01 ENCOUNTER — Other Ambulatory Visit (HOSPITAL_COMMUNITY): Payer: Self-pay

## 2022-06-01 MED ORDER — OMEGA-3-ACID ETHYL ESTERS 1 G PO CAPS: 1.0000 | ORAL_CAPSULE | Freq: Two times a day (BID) | ORAL | 1 refills | Status: DC

## 2022-06-01 NOTE — Telephone Encounter (Signed)
Letter sent to patient about re scheduling angiogram with Dr. Aundra Dubin.

## 2022-06-01 NOTE — Telephone Encounter (Signed)
VM left for patient to call office back to re schedule angiogram procedure.

## 2022-06-01 NOTE — Telephone Encounter (Signed)
Patient called back about angiogram procedure.Patient given days that are available.Patient scheduled for Friday July 7,2023 _0  am. Called patient back with scheduled time and unable to reach patient. Letter mailed to patient  with day  and time and instructions.

## 2022-06-06 ENCOUNTER — Ambulatory Visit (INDEPENDENT_AMBULATORY_CARE_PROVIDER_SITE_OTHER): Payer: Medicare Other | Admitting: Pharmacist

## 2022-06-06 DIAGNOSIS — Z86711 Personal history of pulmonary embolism: Secondary | ICD-10-CM | POA: Diagnosis not present

## 2022-06-06 DIAGNOSIS — Z86718 Personal history of other venous thrombosis and embolism: Secondary | ICD-10-CM | POA: Diagnosis not present

## 2022-06-06 DIAGNOSIS — Z7901 Long term (current) use of anticoagulants: Secondary | ICD-10-CM

## 2022-06-06 DIAGNOSIS — I2699 Other pulmonary embolism without acute cor pulmonale: Secondary | ICD-10-CM | POA: Diagnosis not present

## 2022-06-06 LAB — POCT INR: INR: 3 (ref 2.0–3.0)

## 2022-06-06 NOTE — Patient Instructions (Signed)
Patient instructed to take medications as defined in the Anti-coagulation Track section of this encounter.  Patient instructed to take today's dose.  Patient instructed to take one-and-one-half (1 & 1/2) of your 61m blue colored warfarin tablets by mouth, once-daily, every day.  Patient verbalized understanding of these instructions.

## 2022-06-06 NOTE — Progress Notes (Signed)
Anticoagulation Management Jesus Russell is a 71 y.o. male who reports to the clinic for monitoring of warfarin treatment.    Indication: DVT and PE--with history of infarction; long term current use of oral anticoagulant warfarin to target INR 2.0 - 3.0.  Duration: indefinite Supervising physician: Joni Reining  Anticoagulation Clinic Visit History: Patient does not report signs/symptoms of bleeding or thromboembolism  Other recent changes: No diet, medications, lifestyle changes cited by the patient at this visit.  Anticoagulation Episode Summary     Current INR goal:  2.0-3.0  TTR:  73.8 % (2.6 y)  Next INR check:  07/02/2022  INR from last check:  3.0 (06/06/2022)  Weekly max warfarin dose:    Target end date:  Indefinite  INR check location:    Preferred lab:    Send INR reminders to:     Indications   Long term (current) use of anticoagulants [Z79.01] History of DVT (deep vein thrombosis) [Z86.718] Pulmonary embolism on long-term anticoagulation therapy (HCC) [I26.99 Z79.01] History of pulmonary embolism [Z86.711]        Comments:           No Known Allergies  Current Outpatient Medications:    albuterol (VENTOLIN HFA) 108 (90 Base) MCG/ACT inhaler, Inhale 2 puffs into the lungs every 6 (six) hours as needed for wheezing or shortness of breath., Disp: 18 g, Rfl: 5   alendronate (FOSAMAX) 70 MG tablet, Take 1 tablet (70 mg total) by mouth once a week., Disp: 4 tablet, Rfl: 5   Ascorbic Acid (VITAMIN C) 500 MG CAPS, Take 500 mg by mouth daily., Disp: , Rfl:    cholecalciferol (VITAMIN D3) 25 MCG (1000 UT) tablet, Take 1,000 Units by mouth daily., Disp: , Rfl:    omega-3 acid ethyl esters (LOVAZA) 1 g capsule, Take 1 capsule (1 g total) by mouth 2 (two) times daily., Disp: 180 capsule, Rfl: 1   Polyethyl Glycol-Propyl Glycol (SYSTANE OP), Place 1-2 drops into both eyes as needed (Dry eyes)., Disp: , Rfl:    potassium chloride (KLOR-CON) 20 MEQ packet, Take 60 mEq by  mouth daily., Disp: 90 packet, Rfl: 11   Riociguat (ADEMPAS) 2.5 MG TABS, Take 2.5 mg by mouth in the morning, at noon, and at bedtime., Disp: 270 tablet, Rfl: 3   Selexipag 600 MCG TABS, Take 600 mcg by mouth 2 (two) times daily., Disp: 30 tablet, Rfl: 11   torsemide (DEMADEX) 20 MG tablet, Take 4 tablets (80 mg total) by mouth every morning AND 2 tablets (40 mg total) every evening., Disp: 540 tablet, Rfl: 3   TRELEGY ELLIPTA 200-62.5-25 MCG/INH AEPB, INHALE 1 PUFF INTO THE LUNGS DAILY, Disp: 60 each, Rfl: 3   warfarin (COUMADIN) 4 MG tablet, Take one-and-one-half (1&1/2) tablets all days of the week--EXCEPT on Wednesdays, take two (2) tablets of your warfarin at [facility] each day. (Patient taking differently: Take 6 mg by mouth daily at 4 PM. 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    Social History   Socioeconomic History   Marital status: Divorced    Spouse name: Not on file   Number of children: Not on file   Years of education: Not on file   Highest education level: Not on file  Occupational History   Occupation: retired Pharmacist, hospital  Tobacco Use   Smoking status: Former    Packs/day:  1.50    Years: 23.00    Total pack years: 34.50    Types: Cigarettes    Quit date: 1995    Years since quitting: 28.4   Smokeless tobacco: Never  Vaping Use   Vaping Use: Never used  Substance and Sexual Activity   Alcohol use: Never   Drug use: Not Currently    Types: "Crack" cocaine    Comment: quit 2008   Sexual activity: Not on file  Other Topics Concern   Not on file  Social History Narrative   Not on file   Social Determinants of Health   Financial Resource Strain: Low Risk  (09/09/2019)   Overall Financial Resource Strain (CARDIA)    Difficulty of Paying Living Expenses: Not very hard  Food Insecurity: Not on file  Transportation Needs: Not on file  Physical Activity: Not on file  Stress: Not on file  Social Connections: Not on file   Family History  Adopted: Yes    ASSESSMENT Recent Results: The most recent result is correlated with 44 mg per week: Lab Results  Component Value Date   INR 3.0 06/06/2022   INR 2.7 05/09/2022   INR 1.5 (A) 04/16/2022    Anticoagulation Dosing: Description   Take one-and-one-half (1 & 1/2) of your 60m blue colored warfarin tablets by mouth, once-daily, every day.        INR today: Therapeutic  PLAN Weekly dose was decreased by 4% to 42 mg per week  Patient Instructions  Patient instructed to take medications as defined in the Anti-coagulation Track section of this encounter.  Patient instructed to take today's dose.  Patient instructed to take one-and-one-half (1 & 1/2) of your 467mblue colored warfarin tablets by mouth, once-daily, every day.  Patient verbalized understanding of these instructions.   Patient advised to contact clinic or seek medical attention if signs/symptoms of bleeding or thromboembolism occur.  Patient verbalized understanding by repeating back information and was advised to contact me if further medication-related questions arise. Patient was also provided an information  handout.  Follow-up Return in about 26 days (around 07/02/2022) for Follow up INR.  JaPennie BanterPharmD, CPP  15 minutes spent face-to-face with the patient during the encounter. 50% of time spent on education, including signs/sx bleeding and clotting, as well as food and drug interactions with warfarin. 50% of time was spent on fingerprick POC INR sample collection,processing, results determination, and documentation in [email or web address]

## 2022-06-07 ENCOUNTER — Other Ambulatory Visit (HOSPITAL_COMMUNITY): Payer: Self-pay

## 2022-06-07 DIAGNOSIS — I50812 Chronic right heart failure: Secondary | ICD-10-CM

## 2022-06-07 MED ORDER — SODIUM CHLORIDE 0.9% FLUSH: 3.0000 mL | Freq: Two times a day (BID) | INTRAVENOUS | Status: DC

## 2022-06-08 NOTE — Progress Notes (Signed)
Evaluation and management procedures were performed by the Clinical Pharmacy Practitioner under my supervision and collaboration. I have reviewed the Practitioner's note and chart, and I agree with the management and plan as documented above. ° °

## 2022-06-29 ENCOUNTER — Other Ambulatory Visit: Payer: Self-pay

## 2022-06-29 ENCOUNTER — Encounter (HOSPITAL_COMMUNITY): Payer: Self-pay | Admitting: Cardiology

## 2022-06-29 ENCOUNTER — Encounter (HOSPITAL_COMMUNITY)
Admission: RE | Disposition: A | Discharge status: Home / Self Care | Payer: Self-pay | Source: Home / Self Care | Attending: Cardiology

## 2022-06-29 ENCOUNTER — Ambulatory Visit (HOSPITAL_COMMUNITY)
Admission: RE | Admit: 2022-06-29 | Discharge: 2022-06-29 | Disposition: A | Discharge status: Home / Self Care | Payer: Medicare Other | Attending: Cardiology | Admitting: Cardiology

## 2022-06-29 DIAGNOSIS — I2721 Secondary pulmonary arterial hypertension: Secondary | ICD-10-CM | POA: Insufficient documentation

## 2022-06-29 DIAGNOSIS — I110 Hypertensive heart disease with heart failure: Secondary | ICD-10-CM | POA: Insufficient documentation

## 2022-06-29 DIAGNOSIS — I509 Heart failure, unspecified: Secondary | ICD-10-CM | POA: Diagnosis not present

## 2022-06-29 DIAGNOSIS — Z87891 Personal history of nicotine dependence: Secondary | ICD-10-CM | POA: Insufficient documentation

## 2022-06-29 DIAGNOSIS — I272 Pulmonary hypertension, unspecified: Secondary | ICD-10-CM

## 2022-06-29 DIAGNOSIS — I50812 Chronic right heart failure: Secondary | ICD-10-CM

## 2022-06-29 DIAGNOSIS — J9621 Acute and chronic respiratory failure with hypoxia: Secondary | ICD-10-CM | POA: Diagnosis not present

## 2022-06-29 HISTORY — PX: RIGHT HEART CATH: CATH118263

## 2022-06-29 LAB — POCT I-STAT EG7
Acid-Base Excess: 8 mmol/L — ABNORMAL HIGH (ref 0.0–2.0)
Acid-Base Excess: 9 mmol/L — ABNORMAL HIGH (ref 0.0–2.0)
Bicarbonate: 36.5 mmol/L — ABNORMAL HIGH (ref 20.0–28.0)
Bicarbonate: 36.5 mmol/L — ABNORMAL HIGH (ref 20.0–28.0)
Calcium, Ion: 1.2 mmol/L (ref 1.15–1.40)
Calcium, Ion: 1.21 mmol/L (ref 1.15–1.40)
HCT: 30 % — ABNORMAL LOW (ref 39.0–52.0)
HCT: 30 % — ABNORMAL LOW (ref 39.0–52.0)
Hemoglobin: 10.2 g/dL — ABNORMAL LOW (ref 13.0–17.0)
Hemoglobin: 10.2 g/dL — ABNORMAL LOW (ref 13.0–17.0)
O2 Saturation: 63 %
O2 Saturation: 64 %
Potassium: 3.7 mmol/L (ref 3.5–5.1)
Potassium: 3.7 mmol/L (ref 3.5–5.1)
Sodium: 141 mmol/L (ref 135–145)
Sodium: 141 mmol/L (ref 135–145)
TCO2: 39 mmol/L — ABNORMAL HIGH (ref 22–32)
TCO2: 39 mmol/L — ABNORMAL HIGH (ref 22–32)
pCO2, Ven: 70.9 mmHg (ref 44–60)
pCO2, Ven: 70.9 mmHg (ref 44–60)
pH, Ven: 7.319 (ref 7.25–7.43)
pH, Ven: 7.32 (ref 7.25–7.43)
pO2, Ven: 37 mmHg (ref 32–45)
pO2, Ven: 37 mmHg (ref 32–45)

## 2022-06-29 LAB — BASIC METABOLIC PANEL
Anion gap: 13 (ref 5–15)
BUN: 18 mg/dL (ref 8–23)
CO2: 29 mmol/L (ref 22–32)
Calcium: 8.8 mg/dL — ABNORMAL LOW (ref 8.9–10.3)
Chloride: 97 mmol/L — ABNORMAL LOW (ref 98–111)
Creatinine, Ser: 1.08 mg/dL (ref 0.61–1.24)
GFR, Estimated: >60 mL/min (ref >60)
Glucose, Bld: 95 mg/dL (ref 70–99)
Potassium: 4.2 mmol/L (ref 3.5–5.1)
Sodium: 139 mmol/L (ref 135–145)

## 2022-06-29 LAB — CBC
HCT: 32.9 % — ABNORMAL LOW (ref 39.0–52.0)
Hemoglobin: 9 g/dL — ABNORMAL LOW (ref 13.0–17.0)
MCH: 22 pg — ABNORMAL LOW (ref 26.0–34.0)
MCHC: 27.4 g/dL — ABNORMAL LOW (ref 30.0–36.0)
MCV: 80.2 fL (ref 80.0–100.0)
Platelets: 406 10*3/uL — ABNORMAL HIGH (ref 150–400)
RBC: 4.1 MIL/uL — ABNORMAL LOW (ref 4.22–5.81)
RDW: 21.9 % — ABNORMAL HIGH (ref 11.5–15.5)
WBC: 9.8 10*3/uL (ref 4.0–10.5)
nRBC: 0 % (ref 0.0–0.2)

## 2022-06-29 LAB — PROTIME-INR
INR: 3.3 — ABNORMAL HIGH (ref 0.8–1.2)
Prothrombin Time: 33 seconds — ABNORMAL HIGH (ref 11.4–15.2)

## 2022-06-29 SURGERY — RIGHT HEART CATH
Anesthesia: LOCAL

## 2022-06-29 MED ORDER — LIDOCAINE HCL (PF) 1 % IJ SOLN
INTRAMUSCULAR | Status: DC | PRN
  Administered 2022-06-29: 1 mL

## 2022-06-29 MED ORDER — HEPARIN (PORCINE) IN NACL 1000-0.9 UT/500ML-% IV SOLN
INTRAVENOUS | Status: AC
Start: 2022-06-29 — End: ?
  Filled 2022-06-29: qty 500

## 2022-06-29 MED ORDER — ASPIRIN 81 MG PO CHEW
81.0000 mg | CHEWABLE_TABLET | ORAL | Status: AC
  Administered 2022-06-29: 81 mg via ORAL
  Filled 2022-06-29: qty 1

## 2022-06-29 MED ORDER — LIDOCAINE HCL (PF) 1 % IJ SOLN
INTRAMUSCULAR | Status: AC
  Filled 2022-06-29: qty 30

## 2022-06-29 MED ORDER — LABETALOL HCL 5 MG/ML IV SOLN: 10.0000 mg | INTRAVENOUS | Status: DC | PRN

## 2022-06-29 MED ORDER — HEPARIN (PORCINE) IN NACL 1000-0.9 UT/500ML-% IV SOLN
INTRAVENOUS | Status: DC | PRN
  Administered 2022-06-29: 500 mL

## 2022-06-29 MED ORDER — SODIUM CHLORIDE 0.9% FLUSH: 3.0000 mL | INTRAVENOUS | Status: DC | PRN

## 2022-06-29 MED ORDER — ACETAMINOPHEN 325 MG PO TABS: 650.0000 mg | ORAL_TABLET | ORAL | Status: DC | PRN

## 2022-06-29 MED ORDER — SODIUM CHLORIDE 0.9 % IV SOLN: 250.0000 mL | INTRAVENOUS | Status: DC | PRN

## 2022-06-29 MED ORDER — HYDRALAZINE HCL 20 MG/ML IJ SOLN: 10.0000 mg | INTRAMUSCULAR | Status: DC | PRN

## 2022-06-29 MED ORDER — ONDANSETRON HCL 4 MG/2ML IJ SOLN: 4.0000 mg | Freq: Four times a day (QID) | INTRAMUSCULAR | Status: DC | PRN

## 2022-06-29 MED ORDER — SODIUM CHLORIDE 0.9% FLUSH
3.0000 mL | Freq: Two times a day (BID) | INTRAVENOUS | Status: DC
Start: 2022-06-29 — End: 2022-06-29

## 2022-06-29 MED ORDER — SODIUM CHLORIDE 0.9 % IV SOLN: INTRAVENOUS | Status: DC

## 2022-06-29 SURGICAL SUPPLY — 7 items
CATH BALLN WEDGE 5F 110CM (CATHETERS) ×1 IMPLANT
GUIDEWIRE .025 260CM (WIRE) ×1 IMPLANT
PACK CARDIAC CATHETERIZATION (CUSTOM PROCEDURE TRAY) ×2 IMPLANT
PROTECTION STATION PRESSURIZED (MISCELLANEOUS) ×2
SHEATH GLIDE SLENDER 4/5FR (SHEATH) ×1 IMPLANT
STATION PROTECTION PRESSURIZED (MISCELLANEOUS) IMPLANT
TRANSDUCER W/STOPCOCK (MISCELLANEOUS) ×2 IMPLANT

## 2022-06-29 NOTE — H&P (Signed)
Advanced Heart Failure Team History and Physical Note   PCP:  Serita Butcher, MD  PCP-Cardiology: None     Reason for Admission: RHC   HPI:   71 y.o. with pulmonary hypertension here for RHC to follow RV failure and pulmonary hypertension.     Review of Systems: All systems reviewed and negative except as per HPI.    Home Medications Prior to Admission medications   Medication Sig Start Date End Date Taking? Authorizing Provider  acetaminophen (TYLENOL) 500 MG tablet Take 1,000-1,500 mg by mouth every 6 (six) hours as needed for mild pain or moderate pain.   Yes [provider]  albuterol (VENTOLIN HFA) 108 (90 Base) MCG/ACT inhaler Inhale 2 puffs into the lungs every 6 (six) hours as needed for wheezing or shortness of breath. 03/27/22  Yes Madalyn Rob, MD  alendronate (FOSAMAX) 70 MG tablet Take 1 tablet (70 mg total) by mouth once a week. 05/17/22  Yes Madalyn Rob, MD  Ascorbic Acid (VITAMIN C) 500 MG CAPS Take 500 mg by mouth daily.   Yes [provider]  cholecalciferol (VITAMIN D3) 25 MCG (1000 UT) tablet Take 1,000 Units by mouth daily.   Yes [provider]  omega-3 acid ethyl esters (LOVAZA) 1 g capsule Take 1 capsule (1 g total) by mouth 2 (two) times daily. 06/01/22  Yes [name], MD  oxymetazoline (AFRIN) 0.05 % nasal spray Place 1 spray into both nostrils daily as needed for congestion.   Yes [provider]  Polyethyl Glycol-Propyl Glycol (SYSTANE OP) Place 1-2 drops into both eyes as needed (Dry eyes).   Yes [provider]  potassium chloride (KLOR-CON) 20 MEQ packet Take 60 mEq by mouth daily. 06/30/21  Yes [name], MD  Riociguat (ADEMPAS) 2.5 MG TABS Take 2.5 mg by mouth in the morning, at noon, and at bedtime. 01/05/22  Yes [name], MD  Selexipag 600 MCG TABS Take 600 mcg by mouth 2 (two) times daily. Patient taking differently: Take 300 mcg by mouth 2 (two) times daily. 11/20/21  Yes [name], MD  torsemide (DEMADEX) 20 MG tablet Take 4 tablets (80 mg total) by mouth every morning AND 2 tablets (40 mg total) every evening. Patient taking differently: Take (60 mg total) by mouth every morning AND (20 mg total) every evening. 05/04/21  Yes [name], MD  [place] 200-62.5-25 MCG/INH AEPB INHALE 1 PUFF INTO THE LUNGS DAILY 10/06/21  Yes Spero Geralds, MD  warfarin (COUMADIN) 4 MG tablet Take one-and-one-half (1&1/2) tablets all days of the week--EXCEPT on Wednesdays, take two (2) tablets of your warfarin at [facility] each day. Patient taking differently: Take 6 mg by mouth daily at 4 PM. Take one-and-one-half (1&1/2) tablets all days of the week. 05/02/22  Yes Pennie Banter, RPH-CPP  zinc gluconate 50 MG tablet Take 50 mg by mouth daily.   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     Past Surgical History: Past Surgical History:  Procedure Laterality Date   BRAIN SURGERY     HERNIA REPAIR     IR KYPHO EA ADDL LEVEL THORACIC OR LUMBAR  05/04/2020   IR KYPHO EA ADDL LEVEL THORACIC OR LUMBAR  05/04/2020   IR KYPHO THORACIC WITH BONE BIOPSY  05/04/2020   IR RADIOLOGIST EVAL & MGMT  04/07/2020   IR RADIOLOGIST EVAL & MGMT  06/08/2020   NASAL ENDOSCOPY WITH EPISTAXIS CONTROL  N/A 02/24/2020   Procedure: NASAL ENDOSCOPY WITH EPISTAXIS CONTROL;  Surgeon: [name], MD;  Location: [facility];  Service: ENT;  Laterality: N/A;   RIGHT HEART CATH N/A 07/06/2020   Procedure: RIGHT HEART CATH;  Surgeon: [name], MD;  Location: [facility];  Service: Cardiovascular;  Laterality: N/A;    Family History:  Family History  Adopted: Yes    Social History: Social History   Socioeconomic History   Marital status: Divorced    Spouse name: Not on file   Number of children: Not on file   Years of education: Not on file   Highest education level: Not on file  Occupational History   Occupation: retired Pharmacist, hospital  Tobacco Use   Smoking status: Former    Packs/day: 1.50    Years: 23.00    Total pack years: 34.50    Types: Cigarettes    Quit date: 1995    Years since quitting: 28.5   Smokeless tobacco: Never  Vaping Use   Vaping Use: Never used  Substance and Sexual Activity   Alcohol use: Never   Drug use: Not Currently    Types: "Crack" cocaine    Comment: quit 2008   Sexual activity: Not on file  Other Topics Concern   Not on file  Social History Narrative   Not on file   Social Determinants of Health   Financial Resource Strain: Low Risk  (09/09/2019)   Overall Financial Resource Strain (CARDIA)    Difficulty of Paying Living Expenses: Not very hard  Food Insecurity: Not on file  Transportation Needs: Not on file  Physical Activity: Not on file  Stress: Not on file  Social Connections: Not on file    Allergies:  No Known Allergies  Objective:    Vital Signs:   Temp:  [98.2 F (36.8 C)] 98.2 F (36.8 C) (07/07 0709) Pulse Rate:  [83] 83 (07/07 0709) Resp:  [16] 16 (07/07 0709) BP: (103)/(68) 103/68 (07/07 0709) SpO2:  [92 %] 92 % (07/07 0709) Weight:  [83.9 kg] 83.9 kg (07/07 0709)   Filed Weights   06/29/22 0709  Weight: 83.9 kg     Physical Exam     General:  Well appearing. No respiratory difficulty. Kyphotic HEENT:  Normal Neck: Supple. no JVD. Carotids 2+ bilat; no bruits. No lymphadenopathy or thyromegaly appreciated. Cor: PMI nondisplaced. Regular rate & rhythm. No rubs, gallops or murmurs. Lungs: Clear Abdomen: Soft, nontender, nondistended. No hepatosplenomegaly. No bruits or masses. Good bowel sounds. Extremities: No cyanosis, clubbing, rash, edema Neuro: Alert & oriented x 3, cranial nerves grossly intact. moves all 4 extremities w/o difficulty. Affect pleasant.   Telemetry   NSR  Labs     Basic Metabolic Panel: Recent  Labs  Lab 06/29/22 0705  NA 139  K 4.2  CL 97*  CO2 29  GLUCOSE 95  BUN 18  CREATININE 1.08  CALCIUM 8.8*    Liver Function Tests: No results for input(s): "AST", "ALT", "ALKPHOS", "BILITOT", "PROT", "ALBUMIN" in the last 168 hours. No results for input(s): "LIPASE", "AMYLASE" in the last 168 hours. No results for input(s): "AMMONIA" in the last 168 hours.  CBC: Recent Labs  Lab 06/29/22 0705  WBC 9.8  HGB 9.0*  HCT 32.9*  MCV 80.2  PLT 406*    Cardiac Enzymes: No results for input(s): "CKTOTAL", "CKMB", "CKMBINDEX", "TROPONINI" in the last 168 hours.  BNP: BNP (last 3 results) Recent Labs    10/20/21 1133 05/02/22 1519  BNP 48.0 45.0    ProBNP (last 3 results) No results for input(s): "PROBNP" in the last 8760 hours.   CBG: No results for input(s): "GLUCAP" in the last 168 hours.  Coagulation Studies: Recent Labs    06/29/22 0705  LABPROT 33.0*  INR 3.3*    Imaging: No results found.   Assessment/Plan   RHC to be done today via brachial access.  INR 3.3.    Loralie Champagne, MD 06/29/2022, 8:40 AM  Advanced Heart Failure Team Pager [phone] (M-F; 7a - 5p)  Please contact [facility] for night-coverage after hours (4p -7a ) and weekends on [email or web address]

## 2022-06-29 NOTE — Progress Notes (Signed)
Patient was given discharge instructions. He verbalized understanding.

## 2022-06-29 NOTE — Discharge Instructions (Signed)

## 2022-07-02 ENCOUNTER — Ambulatory Visit (INDEPENDENT_AMBULATORY_CARE_PROVIDER_SITE_OTHER): Payer: Medicare Other | Admitting: Pharmacist

## 2022-07-02 DIAGNOSIS — I2699 Other pulmonary embolism without acute cor pulmonale: Secondary | ICD-10-CM | POA: Diagnosis not present

## 2022-07-02 DIAGNOSIS — Z86711 Personal history of pulmonary embolism: Secondary | ICD-10-CM

## 2022-07-02 DIAGNOSIS — Z7901 Long term (current) use of anticoagulants: Secondary | ICD-10-CM | POA: Diagnosis not present

## 2022-07-02 DIAGNOSIS — Z86718 Personal history of other venous thrombosis and embolism: Secondary | ICD-10-CM | POA: Diagnosis not present

## 2022-07-02 LAB — POCT INR: INR: 2.9 (ref 2.0–3.0)

## 2022-07-02 NOTE — Patient Instructions (Signed)
Patient instructed to take medications as defined in the Anti-coagulation Track section of this encounter.  Patient instructed to take today's dose.  Patient instructed to take one-and-one-half (1 & 1/2) of your 56m blue colored warfarin tablets by mouth, once-daily, every day--EXCEPT on MONDAYS and THURSDAYS, take only one (1) tablet on Mondays and Thursdays.    Patient verbalized understanding of these instructions.

## 2022-07-02 NOTE — Progress Notes (Signed)
Anticoagulation Management Jesus Russell is a 71 y.o. male who reports to the clinic for monitoring of warfarin treatment.    Indication: DVT and PE, history of; Long term current anticoagulant therapy with warfarin. Target INR 2.0 - 3.0. Duration: indefinite Supervising physician: Aldine Contes  Anticoagulation Clinic Visit History: Patient does not report signs/symptoms of bleeding or thromboembolism  Other recent changes: No diet, medications, lifestyle changes.  Anticoagulation Episode Summary     Current INR goal:  2.0-3.0  TTR:  71.9 % (2.6 y)  Next INR check:  07/30/2022  INR from last check:  2.9 (07/02/2022)  Weekly max warfarin dose:    Target end date:  Indefinite  INR check location:    Preferred lab:    Send INR reminders to:     Indications   Long term (current) use of anticoagulants [Z79.01] History of DVT (deep vein thrombosis) [Z86.718] Pulmonary embolism on long-term anticoagulation therapy (HCC) [I26.99 Z79.01] History of pulmonary embolism [Z86.711]        Comments:           No Known Allergies  Current Outpatient Medications:    acetaminophen (TYLENOL) 500 MG tablet, Take 1,000-1,500 mg by mouth every 6 (six) hours as needed for mild pain or moderate pain., Disp: , Rfl:    albuterol (VENTOLIN HFA) 108 (90 Base) MCG/ACT inhaler, Inhale 2 puffs into the lungs every 6 (six) hours as needed for wheezing or shortness of breath., Disp: 18 g, Rfl: 5   alendronate (FOSAMAX) 70 MG tablet, Take 1 tablet (70 mg total) by mouth once a week., Disp: 4 tablet, Rfl: 5   Ascorbic Acid (VITAMIN C) 500 MG CAPS, Take 500 mg by mouth daily., Disp: , Rfl:    cholecalciferol (VITAMIN D3) 25 MCG (1000 UT) tablet, Take 1,000 Units by mouth daily., Disp: , Rfl:    omega-3 acid ethyl esters (LOVAZA) 1 g capsule, Take 1 capsule (1 g total) by mouth 2 (two) times daily., Disp: 180 capsule, Rfl: 1   oxymetazoline (AFRIN) 0.05 % nasal spray, Place 1 spray into both nostrils daily  as needed for congestion., Disp: , Rfl:    Polyethyl Glycol-Propyl Glycol (SYSTANE OP), Place 1-2 drops into both eyes as needed (Dry eyes)., Disp: , Rfl:    potassium chloride (KLOR-CON) 20 MEQ packet, Take 60 mEq by mouth daily., Disp: 90 packet, Rfl: 11   Riociguat (ADEMPAS) 2.5 MG TABS, Take 2.5 mg by mouth in the morning, at noon, and at bedtime., Disp: 270 tablet, Rfl: 3   Selexipag 600 MCG TABS, Take 600 mcg by mouth 2 (two) times daily. (Patient taking differently: Take 300 mcg by mouth 2 (two) times daily.), Disp: 30 tablet, Rfl: 11   torsemide (DEMADEX) 20 MG tablet, Take 4 tablets (80 mg total) by mouth every morning AND 2 tablets (40 mg total) every evening. (Patient taking differently: Take (60 mg total) by mouth every morning AND (20 mg total) every evening.), Disp: 540 tablet, Rfl: 3   TRELEGY ELLIPTA 200-62.5-25 MCG/INH AEPB, INHALE 1 PUFF INTO THE LUNGS DAILY, Disp: 60 each, Rfl: 3   warfarin (COUMADIN) 4 MG tablet, Take one-and-one-half (1&1/2) tablets all days of the week--EXCEPT on Wednesdays, take two (2) tablets of your warfarin at [facility] each day. (Patient taking differently: Take 6 mg by mouth daily at 4 PM. 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    Social History   Socioeconomic History   Marital status: Divorced    Spouse name: Not on file   Number of children: Not on file   Years of education: Not on file   Highest education level: Not on file  Occupational History   Occupation: retired Pharmacist, hospital  Tobacco Use   Smoking status: Former    Packs/day: 1.50    Years: 23.00    Total pack years: 34.50    Types: Cigarettes    Quit date: 1995    Years since quitting: 28.5   Smokeless tobacco: Never  Vaping Use   Vaping Use: Never used  Substance and Sexual Activity   Alcohol use: Never   Drug use: Not Currently    Types: "Crack" cocaine    Comment: quit 2008   Sexual activity: Not on file  Other Topics Concern   Not on file  Social History Narrative   Not on file   Social Determinants of Health   Financial Resource Strain: Low Risk  (09/09/2019)   Overall Financial Resource Strain (CARDIA)    Difficulty of Paying Living Expenses: Not very hard  Food Insecurity: Not on file  Transportation Needs: Not on file  Physical Activity: Not on file  Stress: Not on file  Social Connections: Not on file   Family History  Adopted: Yes    ASSESSMENT Recent Results: The most recent result is correlated with 42 mg per week: Lab Results  Component Value Date   INR 2.9 07/02/2022   INR 3.3 (H) 06/29/2022   INR 3.0 06/06/2022    Anticoagulation Dosing: Description   Take one-and-one-half (1 & 1/2) of your 32m blue colored warfarin tablets by mouth, once-daily, every day--EXCEPT on MONDAYS and THURSDAYS, take only one (1) tablet on Mondays and Thursdays.        INR today: Therapeutic  PLAN Weekly dose was decreased by 9% to 38 mg per week  Patient Instructions  Patient instructed to take medications as defined in the Anti-coagulation Track section of this encounter.  Patient instructed to take today's dose.  Patient instructed to take  one-and-one-half (1 & 1/2) of your 471mblue colored warfarin tablets by mouth, once-daily, every day--EXCEPT on MONDAYS and THURSDAYS, take only one (1) tablet on Mondays and Thursdays.    Patient verbalized understanding of these instructions.   Patient advised to contact clinic or seek medical attention if signs/symptoms of bleeding or thromboembolism occur.  Patient verbalized understanding by repeating back information and was advised to contact me if further medication-related questions arise. Patient was also provided an information handout.  Follow-up Return in 4 weeks (on 07/30/2022) for Follow up INR.  JaPennie BanterPharmD, CPP  15 minutes spent face-to-face with the patient during the encounter. 50% of time spent on education, including signs/sx bleeding and clotting, as well as food and drug interactions with warfarin. 50% of time was spent on fingerprick POC INR sample collection,processing, results determination, and documentation in [email or web address].net/.

## 2022-07-03 NOTE — Progress Notes (Signed)
INTERNAL MEDICINE TEACHING ATTENDING ADDENDUM - Aldine Contes M.D  Duration- indefinite, Indication- PE, hypercoaguable state, INR- therapeutic. Agree with pharmacy recommendations as outlined in their note.

## 2022-07-30 ENCOUNTER — Ambulatory Visit (INDEPENDENT_AMBULATORY_CARE_PROVIDER_SITE_OTHER): Payer: Medicare Other | Admitting: Pharmacist

## 2022-07-30 DIAGNOSIS — Z86718 Personal history of other venous thrombosis and embolism: Secondary | ICD-10-CM

## 2022-07-30 DIAGNOSIS — Z7901 Long term (current) use of anticoagulants: Secondary | ICD-10-CM | POA: Diagnosis not present

## 2022-07-30 DIAGNOSIS — I2699 Other pulmonary embolism without acute cor pulmonale: Secondary | ICD-10-CM

## 2022-07-30 DIAGNOSIS — Z86711 Personal history of pulmonary embolism: Secondary | ICD-10-CM | POA: Diagnosis not present

## 2022-07-30 LAB — POCT INR: INR: 2.8 (ref 2.0–3.0)

## 2022-07-30 NOTE — Progress Notes (Signed)
INTERNAL MEDICINE TEACHING ATTENDING ADDENDUM ° °I agree with pharmacy recommendations as outlined in their note.  ° °-Tallis Soledad MD ° °

## 2022-07-30 NOTE — Progress Notes (Signed)
Anticoagulation Management Jesus Russell is a 71 y.o. male who reports to the clinic for monitoring of warfarin treatment.    Indication: DVT and PE , History of; Long term current use of oral anticoagulant, warfarin. Target INR 2.0 - 3.0. Duration: indefinite Supervising physician: Lalla Brothers  Anticoagulation Clinic Visit History: Patient does not report signs/symptoms of bleeding or thromboembolism  Other recent changes: No diet, medications, lifestyle changes endorsed by the patient at this visit.  Anticoagulation Episode Summary     Current INR goal:  2.0-3.0  TTR:  72.7 % (2.7 y)  Next INR check:  09/03/2022  INR from last check:  2.8 (07/30/2022)  Weekly max warfarin dose:    Target end date:  Indefinite  INR check location:    Preferred lab:    Send INR reminders to:     Indications   Long term (current) use of anticoagulants [Z79.01] History of DVT (deep vein thrombosis) [Z86.718] Pulmonary embolism on long-term anticoagulation therapy (HCC) [I26.99 Z79.01] History of pulmonary embolism [Z86.711]        Comments:           No Known Allergies  Current Outpatient Medications:    acetaminophen (TYLENOL) 500 MG tablet, Take 1,000-1,500 mg by mouth every 6 (six) hours as needed for mild pain or moderate pain., Disp: , Rfl:    albuterol (VENTOLIN HFA) 108 (90 Base) MCG/ACT inhaler, Inhale 2 puffs into the lungs every 6 (six) hours as needed for wheezing or shortness of breath., Disp: 18 g, Rfl: 5   alendronate (FOSAMAX) 70 MG tablet, Take 1 tablet (70 mg total) by mouth once a week., Disp: 4 tablet, Rfl: 5   Ascorbic Acid (VITAMIN C) 500 MG CAPS, Take 500 mg by mouth daily., Disp: , Rfl:    cholecalciferol (VITAMIN D3) 25 MCG (1000 UT) tablet, Take 1,000 Units by mouth daily., Disp: , Rfl:    omega-3 acid ethyl esters (LOVAZA) 1 g capsule, Take 1 capsule (1 g total) by mouth 2 (two) times daily., Disp: 180 capsule, Rfl: 1   oxymetazoline (AFRIN) 0.05 % nasal spray,  Place 1 spray into both nostrils daily as needed for congestion., Disp: , Rfl:    Polyethyl Glycol-Propyl Glycol (SYSTANE OP), Place 1-2 drops into both eyes as needed (Dry eyes)., Disp: , Rfl:    potassium chloride (KLOR-CON) 20 MEQ packet, Take 60 mEq by mouth daily., Disp: 90 packet, Rfl: 11   Riociguat (ADEMPAS) 2.5 MG TABS, Take 2.5 mg by mouth in the morning, at noon, and at bedtime., Disp: 270 tablet, Rfl: 3   Selexipag 600 MCG TABS, Take 600 mcg by mouth 2 (two) times daily. (Patient taking differently: Take 300 mcg by mouth 2 (two) times daily.), Disp: 30 tablet, Rfl: 11   torsemide (DEMADEX) 20 MG tablet, Take 4 tablets (80 mg total) by mouth every morning AND 2 tablets (40 mg total) every evening. (Patient taking differently: Take (60 mg total) by mouth every morning AND (20 mg total) every evening.), Disp: 540 tablet, Rfl: 3   TRELEGY ELLIPTA 200-62.5-25 MCG/INH AEPB, INHALE 1 PUFF INTO THE LUNGS DAILY, Disp: 60 each, Rfl: 3   warfarin (COUMADIN) 4 MG tablet, Take one-and-one-half (1&1/2) tablets all days of the week--EXCEPT on Wednesdays, take two (2) tablets of your warfarin at [facility] each day. (Patient taking differently: Take 6 mg by mouth daily at 4 PM. 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    Social History   Socioeconomic History   Marital status: Divorced    Spouse name: Not on file   Number of children: Not on file   Years of education: Not on file   Highest education level: Not on file  Occupational History   Occupation: retired Pharmacist, hospital  Tobacco Use   Smoking status: Former    Packs/day: 1.50    Years: 23.00    Total pack years: 34.50    Types: Cigarettes    Quit date: 1995    Years since quitting: 28.6   Smokeless tobacco: Never  Vaping Use   Vaping Use: Never used  Substance and Sexual Activity   Alcohol use: Never   Drug use: Not Currently    Types: "Crack" cocaine    Comment: quit 2008   Sexual activity: Not on file  Other Topics Concern   Not on file  Social History Narrative   Not on file   Social Determinants of Health   Financial Resource Strain: Low Risk  (09/09/2019)   Overall Financial Resource Strain (CARDIA)    Difficulty of Paying Living Expenses: Not very hard  Food Insecurity: Not on file  Transportation Needs: Not on file  Physical Activity: Not on file  Stress: Not on file  Social Connections: Not on file   Family History  Adopted: Yes    ASSESSMENT Recent Results: The most recent result is correlated with 38 mg per week: Lab Results  Component Value Date   INR 2.8 07/30/2022   INR 2.9 07/02/2022   INR 3.3 (H) 06/29/2022    Anticoagulation Dosing: Description   Take one (1) tablet on Mondays, Tuesdays, Thursdays and Saturdays. Take one-and-one-half (1 & 1/2) tablets on Sundays, Wednesdays and Fridays.      INR today: Therapeutic  PLAN Weekly dose was decreased by 11% to 34 mg per week  Patient Instructions  Patient instructed to take medications as defined in the Anti-coagulation Track section of this encounter.  Patient instructed to take today's dose.  Patient instructed to take one (1)  tablet on Mondays, Tuesdays, Thursdays and Saturdays. Take one-and-one-half (1 & 1/2) tablets on Sundays, Wednesdays and Fridays. Patient verbalized understanding of these instructions.   Patient advised to contact clinic or seek medical attention if signs/symptoms of bleeding or thromboembolism occur.  Patient verbalized understanding by repeating back information and was advised to contact me if further medication-related questions arise. Patient was also provided an information handout.  Follow-up Return in about 5 weeks (around 09/03/2022) for Follow up INR.  Pennie Banter, PharmD, CPP  15 minutes spent face-to-face with the patient during the encounter. 50% of  time spent on education, including signs/sx bleeding and clotting, as well as food and drug interactions with warfarin. 50% of time was spent on fingerprick POC INR sample collection,processing, results determination, and documentation in [email or web address].net/.

## 2022-07-30 NOTE — Patient Instructions (Signed)
Patient instructed to take medications as defined in the Anti-coagulation Track section of this encounter.  Patient instructed to take today's dose.  Patient instructed to take one (1) tablet on Mondays, Tuesdays, Thursdays and Saturdays. Take one-and-one-half (1 & 1/2) tablets on Sundays, Wednesdays and Fridays. Patient verbalized understanding of these instructions.

## 2022-07-31 ENCOUNTER — Other Ambulatory Visit (HOSPITAL_COMMUNITY): Payer: Self-pay | Admitting: Cardiology

## 2022-08-15 ENCOUNTER — Ambulatory Visit (HOSPITAL_COMMUNITY)
Admission: RE | Admit: 2022-08-15 | Discharge: 2022-08-15 | Disposition: A | Discharge status: Home / Self Care | Payer: Medicare Other | Source: Ambulatory Visit | Attending: Cardiology | Admitting: Cardiology

## 2022-08-15 ENCOUNTER — Encounter (HOSPITAL_COMMUNITY): Payer: Self-pay | Admitting: Cardiology

## 2022-08-15 VITALS — BP 122/70 | HR 72 | Wt 194.8 lb

## 2022-08-15 DIAGNOSIS — G4733 Obstructive sleep apnea (adult) (pediatric): Secondary | ICD-10-CM | POA: Diagnosis not present

## 2022-08-15 DIAGNOSIS — D6862 Lupus anticoagulant syndrome: Secondary | ICD-10-CM | POA: Insufficient documentation

## 2022-08-15 DIAGNOSIS — Z86718 Personal history of other venous thrombosis and embolism: Secondary | ICD-10-CM | POA: Insufficient documentation

## 2022-08-15 DIAGNOSIS — Z7901 Long term (current) use of anticoagulants: Secondary | ICD-10-CM | POA: Insufficient documentation

## 2022-08-15 DIAGNOSIS — I50812 Chronic right heart failure: Secondary | ICD-10-CM

## 2022-08-15 DIAGNOSIS — I272 Pulmonary hypertension, unspecified: Secondary | ICD-10-CM

## 2022-08-15 DIAGNOSIS — D869 Sarcoidosis, unspecified: Secondary | ICD-10-CM | POA: Diagnosis not present

## 2022-08-15 DIAGNOSIS — Z79899 Other long term (current) drug therapy: Secondary | ICD-10-CM | POA: Diagnosis not present

## 2022-08-15 DIAGNOSIS — I10 Essential (primary) hypertension: Secondary | ICD-10-CM | POA: Diagnosis not present

## 2022-08-15 LAB — BRAIN NATRIURETIC PEPTIDE: B Natriuretic Peptide: 26.2 pg/mL (ref 0.0–100.0)

## 2022-08-15 LAB — BASIC METABOLIC PANEL
Anion gap: 9 (ref 5–15)
BUN: 16 mg/dL (ref 8–23)
CO2: 34 mmol/L — ABNORMAL HIGH (ref 22–32)
Calcium: 9.1 mg/dL (ref 8.9–10.3)
Chloride: 95 mmol/L — ABNORMAL LOW (ref 98–111)
Creatinine, Ser: 1.28 mg/dL — ABNORMAL HIGH (ref 0.61–1.24)
GFR, Estimated: >60 mL/min (ref >60)
Glucose, Bld: 97 mg/dL (ref 70–99)
Potassium: 4.6 mmol/L (ref 3.5–5.1)
Sodium: 138 mmol/L (ref 135–145)

## 2022-08-15 NOTE — Progress Notes (Signed)
PCP: Serita Butcher, MD HF Cardiology: Dr. Aundra Dubin  71 y.o. with history of sarcoidosis, prior PE, RV failure was referred by Dr. Shearon Stalls for evaluation of CHF/pulmonary hypertension.  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  Patient was on prednisone for a period of time, this was stopped earlier this year.  He is on 2 L home oxygen.  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 He has had recurrent PEs/DVTs.  He has an IVC filter.  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  Most recent PE was in 2/21 when he had been off warfarin transiently.  Last echo in 5/21 showed EF 55-60% with D-shaped septum and severely dilated/severely dysfunctional RV.  No estimation of PA pressure was available.    RHC in 7/21 showed normal PCWP and moderate PAH with PVR 5 WU. V/Q showed perfusion defects but thought to be due to sarcoidosis.  CTA chest in 7/21 showed no acute or chronic PE, signs of chronic sarcoidosis were present.   Echo (5/22) with EF 55-60%, moderate focal basal septal hypertrophy, severe RV enlargement with moderately decreased systolic function.   Patient has had to cut back on Uptravi due to joint pain and fatigue.  He is currently taking 600 mcg bid.  He stopped Opsumit due to severe peripheral edema. He tried restarting it, and the edema came back, so he stopped it for good.   Echo in 5/23 showed EF 55-60%, moderate RV enlargement with moderate RV dysfunction, PASP 57 mmHg. RHC was done in 7/23, showing normal filling pressures and moderate pulmonary arterial hypertension with preserved cardiac output.   Patient presents for followup of pulmonary hypertension/RV failure.  He is now using CPAP and 2-3 L home oxygen.  Still has chronic dyspnea with moderate exertion.  No orthopnea/PND.  Notes dyspnea after eating.  He thinks it is related to abdominal distention with kyphosis and restriction of diaphragm.  No chest pain.  No  syncope/lightheadedness.  No palpitations.   Labs (6/21): pro-BNP 43, K 4.6, creatinine 0.89 Labs (8/21): hgb 10.5, K 3.8, creatinine 1.28 Labs (10/21): K 5.4, creatinine 1.1 Labs (11/21): K 3.3, creatinine 1.14 Labs (1/22): BNP 365 Labs (2/22): K 4, creatinine 1.2 Labs (3/22): K 3.9, creatinine 1.49, BNP 21 Labs (6/22): BNP 23, K 5, creatinine 1.33 Labs (10/22): K 4, creatinine 1.25, BNP 48 Labs (2/23): K 4.6, creatinine 1.29 Labs (7/23): K 4.2, creatinine 1.08  6 minute walk (11/21): 76 m 6 minute walk (1/22): 183 m 6 minute walk (3/22): 198 m 6 minute walk (5/22): 183 m 6 minute walk (10/22): 152 m 6 minute walk (1/23): 229 m 6 minute walk (8/23): 42 m  PMH: 1. Sarcoidosis: "Burned out."  Diagnosed in 1990 with bronchoscopy/biopsy.  Treated with prednisone, now off.  - On 2 L home oxygen chronically.  2. 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 H/o DVT and PE.  Has lupus anticoagulant.   - Most recent PE was in 2/21, he had been off warfarin transiently.  - V/Q scan in 7/21 with perfusion defects, CTA in 7/21 showed no acute or chronic thrombi, signs of sarcoidosis were present.  4. COVID-19 infection 1/21.  5. H/o compression fractures in 2/21.  - Had kyphoplasty 6. RV failure/pulmonary hypertension:  - Echo (5/21) with EF 55-60%, D-shaped interventricular septum, severely dilated RV with severe systolic dysfunction, dilated IVC.  - RHC (7/21) with mean RA 9, PA 62/21 mean 39, mean PCWP 11, CI 2.64,  PVR 5 WU.  - Did not tolerate Tyvaso.  - Echo (5/22): EF 55-60%, moderate focal basal septal hypertrophy, severe RV enlargement with moderately decreased systolic function.  - Unable to tolerate Opsumit.  - Echo (5/23): EF 55-60%, moderate RV enlargement with moderate RV dysfunction, PASP 57 mmHg.  - RHC (7/23): mean RA 6, PA 58/22 mean 38, mean PCWP 6, CI 3.01, PVR 4.8 7. HTN 8. History of subdural hematoma remotely.   Social History   Socioeconomic History    Marital status: Divorced    Spouse name: Not on file   Number of children: Not on file   Years of education: Not on file   Highest education level: Not on file  Occupational History   Occupation: retired Pharmacist, hospital  Tobacco Use   Smoking status: Former    Packs/day: 1.50    Years: 23.00    Total pack years: 34.50    Types: Cigarettes    Quit date: 1995    Years since quitting: 28.6   Smokeless tobacco: Never  Vaping Use   Vaping Use: Never used  Substance and Sexual Activity   Alcohol use: Never   Drug use: Not Currently    Types: "Crack" cocaine    Comment: quit 2008   Sexual activity: Not on file  Other Topics Concern   Not on file  Social History Narrative   Not on file   Social Determinants of Health   Financial Resource Strain: Low Risk  (09/09/2019)   Overall Financial Resource Strain (CARDIA)    Difficulty of Paying Living Expenses: Not very hard  Food Insecurity: Not on file  Transportation Needs: Not on file  Physical Activity: Not on file  Stress: Not on file  Social Connections: Not on file  Intimate Partner Violence: Not on file   Family History  Adopted: Yes   ROS: All systems reviewed and negative except as per HPI.   Current Outpatient Medications  Medication Sig Dispense Refill   acetaminophen (TYLENOL) 500 MG tablet Take 1,000-1,500 mg by mouth every 6 (six) hours as needed for mild pain or moderate pain.     albuterol (VENTOLIN HFA) 108 (90 Base) MCG/ACT inhaler Inhale 2 puffs into the lungs every 6 (six) hours as needed for wheezing or shortness of breath. 18 g 5   alendronate (FOSAMAX) 70 MG tablet Take 1 tablet (70 mg total) by mouth once a week. 4 tablet 5   Ascorbic Acid (VITAMIN C) 500 MG CAPS Take 500 mg by mouth daily.     cholecalciferol (VITAMIN D3) 25 MCG (1000 UT) tablet Take 1,000 Units by mouth daily.     omega-3 acid ethyl esters (LOVAZA) 1 g capsule Take 1 capsule (1 g total) by mouth 2 (two) times daily. 180 capsule 1    oxymetazoline (AFRIN) 0.05 % nasal spray Place 1 spray into both nostrils daily as needed for congestion.     Polyethyl Glycol-Propyl Glycol (SYSTANE OP) Place 1-2 drops into both eyes as needed (Dry eyes).     potassium chloride (KLOR-CON) 20 MEQ packet Take 60 mEq by mouth daily. 90 packet 11   Riociguat (ADEMPAS) 2.5 MG TABS Take 2.5 mg by mouth in the morning, at noon, and at bedtime. 270 tablet 3   Selexipag 600 MCG TABS Take 300 mcg by mouth 2 (two) times daily.     torsemide (DEMADEX) 20 MG tablet TAKE 4 TABLETS BY MOUTH EVERY MORNING AND 2 TABLETS EVERY EVENING 540 tablet 3   TRELEGY ELLIPTA  200-62.5-25 MCG/INH AEPB INHALE 1 PUFF INTO THE LUNGS DAILY 60 each 3   warfarin (COUMADIN) 4 MG tablet Take one-and-one-half (1&1/2) tablets all days of the week--EXCEPT on Wednesdays, take two (2) tablets of your warfarin at [facility] each day. (Patient taking differently: Take 6 mg by mouth daily at 4 PM. Take one-and-one-half (1&1/2) tablets all days of the week.) 132 tablet 1   zinc gluconate 50 MG tablet Take 50 mg by mouth daily.     No current facility-administered medications for this encounter.   BP 122/70   Pulse 72   Wt 88.4 kg (194 lb 12.8 oz)   SpO2 97% Comment: 3l n/c  BMI 31.44 kg/m  General: NAD, kyphotic, frail Neck: No JVD, no thyromegaly or thyroid nodule.  Lungs: Clear to auscultation bilaterally with normal respiratory effort. CV: Nondisplaced PMI.  Heart regular S1/S2, no S3/S4, no murmur.  No peripheral edema.  No carotid bruit.  Normal pedal pulses.  Abdomen: Soft, nontender, no hepatosplenomegaly, no distention.  Skin: Intact without lesions or rashes.  Neurologic: Alert and oriented x 3.  Psych: Normal affect. Extremities: No clubbing or cyanosis.  HEENT: Normal.   Assessment/Plan: 1. RV failure, pulmonary hypertension: Echo in 5/21 showed EF 55-60%, D-shaped septum with severe RV dilation/severe RV dysfunction.  [place] in 7/21 showed mildly elevated RA pressure, moderate  PAH with PVR 5 WU.  V/Q scan showed perfusion defects, but CTA chest in 7/21 showed no acute or chronic PE, V/Q findings were likely due to chronic sarcoidosis changes in lungs.  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  Echo in 5/22 showed mildly improved RV function compared to 5/21. Echo in 5/23 showed moderate RV dysfunction and moderate RV enlargement, PASP 57 mmHg.  RHC in 7/23 showed moderate pulmonary arterial hypertension, mildly lower than prior study.  He did not tolerate Tyvaso. He is now off Opsumit (cannot tolerate due to swelling).  NYHA class III chronically.  Not volume overloaded on exam though 6 minute walk is worse.  - Continue torsemide 80 qam/40 qpm.  BMET/BNP today.  - Off Opsumit, unable to tolerate.  - Continue Adempas 2.5 mg tid.   - He is on selexipag 300 mcg bid.  Limited by joint pain at higher doses.  I would like to try again to increase selexipag slowly, will place order.   - Continue home oxygen and CPAP.  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  Has positive lupus anticoagulant.  - Continue warfarin, should have Lovenox bridge if stops warfarin (had PE when off warfarin transiently in 2/21).  3. Sarcoidosis: History of "burned out" pulmonary sarcoidosis.  This may be the cause of his pulmonary hypertension.  No definitive evidence for cardiac sarcoidosis, but could arrange for cardiac MRI in the future to investigate. He is no longer on prednisone.  - Continue 2-3 L home oxygen.  4. HTN: BP controlled.  5. OSA: Use CPAP.   Followup 3 months     Loralie Champagne 08/15/2022

## 2022-08-15 NOTE — Patient Instructions (Signed)
Slowly increase uptravi   Labs done today, your results will be available in MyChart, we will contact you for abnormal readings.  Your physician recommends that you schedule a follow-up appointment in: 3 months  If you have any questions or concerns before your next appointment please send [place] a message through [place] or call our office at [phone].    TO LEAVE A MESSAGE FOR THE NURSE SELECT OPTION 2, PLEASE LEAVE A MESSAGE INCLUDING: YOUR NAME DATE OF BIRTH CALL BACK NUMBER REASON FOR CALL**this is important as we prioritize the call backs  YOU WILL RECEIVE A CALL BACK THE SAME DAY AS LONG AS YOU CALL BEFORE 4:00 PM  At the [facility], you and your health needs are our priority. As part of our continuing mission to provide you with exceptional heart care, we have created designated Provider Care Teams. These Care Teams include your primary Cardiologist (physician) and Advanced Practice Providers (APPs- Physician Assistants and Nurse Practitioners) who all work together to provide you with the care you need, when you need it.   You may see any of the following providers on your designated Care Team at your next follow up: Dr Glori Bickers Dr Haynes Kerns, NP Lyda Jester, [place] [facility] [place], [place] Audry Riles, PharmD   Please be sure to bring in all your medications bottles to every appointment.

## 2022-08-15 NOTE — Progress Notes (Signed)
6 Min Walk Test Completed  Pt ambulated 167.6 meters O2 Sat ranged 88%-94% on 4-6L oxygen HR ranged 89-110

## 2022-09-02 ENCOUNTER — Other Ambulatory Visit (HOSPITAL_COMMUNITY): Payer: Self-pay | Admitting: Cardiology

## 2022-09-03 ENCOUNTER — Other Ambulatory Visit: Payer: Self-pay

## 2022-09-03 ENCOUNTER — Ambulatory Visit (INDEPENDENT_AMBULATORY_CARE_PROVIDER_SITE_OTHER): Payer: Medicare Other | Admitting: Pharmacist

## 2022-09-03 ENCOUNTER — Telehealth: Payer: Self-pay | Admitting: Pharmacist

## 2022-09-03 DIAGNOSIS — J449 Chronic obstructive pulmonary disease, unspecified: Secondary | ICD-10-CM

## 2022-09-03 DIAGNOSIS — Z86711 Personal history of pulmonary embolism: Secondary | ICD-10-CM | POA: Diagnosis not present

## 2022-09-03 DIAGNOSIS — I2699 Other pulmonary embolism without acute cor pulmonale: Secondary | ICD-10-CM | POA: Diagnosis not present

## 2022-09-03 DIAGNOSIS — Z86718 Personal history of other venous thrombosis and embolism: Secondary | ICD-10-CM | POA: Diagnosis not present

## 2022-09-03 DIAGNOSIS — Z7901 Long term (current) use of anticoagulants: Secondary | ICD-10-CM | POA: Diagnosis present

## 2022-09-03 LAB — POCT INR: INR: 2 (ref 2.0–3.0)

## 2022-09-03 MED ORDER — WARFARIN SODIUM 4 MG PO TABS: ORAL_TABLET | ORAL | 1 refills | Status: DC

## 2022-09-03 MED ORDER — ALBUTEROL SULFATE HFA 108 (90 BASE) MCG/ACT IN AERS: 2.0000 | INHALATION_SPRAY | Freq: Four times a day (QID) | RESPIRATORY_TRACT | 5 refills | Status: AC | PRN

## 2022-09-03 NOTE — Patient Instructions (Addendum)
Patient instructed to take medications as defined in the Anti-coagulation Track section of this encounter.  Patient instructed to take today's dose.  Patient instructed to take one-and-one half (1 & 1/2) tablets on Sundays, Mondays, Wednesdays and Fridays. Take only one (1) tablet on Tuesdays, Thursdays and Saturdays. Patient verbalized understanding of these instructions.

## 2022-09-03 NOTE — Progress Notes (Signed)
Anticoagulation Management Jesus Russell is a 71 y.o. male who reports to the clinic for monitoring of warfarin treatment.    Indication:  History of DVT, History of PE; long term (curent) use of anticoagulant warfarin, INR target range 2.0 - 3.0.   Duration: indefinite Supervising physician: [facility] Visit History: Patient does not report signs/symptoms of bleeding or thromboembolism  Other recent changes: No diet, medications, lifestyle changes cited.  Anticoagulation Episode Summary     Current INR goal:  2.0-3.0  TTR:  73.6 % (2.8 y)  Next INR check:  10/01/2022  INR from last check:  2.0 (09/03/2022)  Weekly max warfarin dose:    Target end date:  Indefinite  INR check location:    Preferred lab:    Send INR reminders to:     Indications   Long term (current) use of anticoagulants [Z79.01] History of DVT (deep vein thrombosis) [Z86.718] Pulmonary embolism on long-term anticoagulation therapy (HCC) [I26.99 Z79.01] History of pulmonary embolism [Z86.711]        Comments:           No Known Allergies  Current Outpatient Medications:    acetaminophen (TYLENOL) 500 MG tablet, Take 1,000-1,500 mg by mouth every 6 (six) hours as needed for mild pain or moderate pain., Disp: , Rfl:    albuterol (VENTOLIN HFA) 108 (90 Base) MCG/ACT inhaler, Inhale 2 puffs into the lungs every 6 (six) hours as needed for wheezing or shortness of breath., Disp: 18 g, Rfl: 5   alendronate (FOSAMAX) 70 MG tablet, Take 1 tablet (70 mg total) by mouth once a week., Disp: 4 tablet, Rfl: 5   Ascorbic Acid (VITAMIN C) 500 MG CAPS, Take 500 mg by mouth daily., Disp: , Rfl:    cholecalciferol (VITAMIN D3) 25 MCG (1000 UT) tablet, Take 1,000 Units by mouth daily., Disp: , Rfl:    KLOR-CON 20 MEQ packet, DILUTE 3 PACKETS IN LIQUID AS DIRECTED AND TAKE BY MOUTH DAILY, Disp: 90 each, Rfl: 2   omega-3 acid ethyl esters (LOVAZA) 1 g capsule, Take 1 capsule (1 g total) by mouth 2  (two) times daily., Disp: 180 capsule, Rfl: 1   oxymetazoline (AFRIN) 0.05 % nasal spray, Place 1 spray into both nostrils daily as needed for congestion., Disp: , Rfl:    Polyethyl Glycol-Propyl Glycol (SYSTANE OP), Place 1-2 drops into both eyes as needed (Dry eyes)., Disp: , Rfl:    Riociguat (ADEMPAS) 2.5 MG TABS, Take 2.5 mg by mouth in the morning, at noon, and at bedtime., Disp: 270 tablet, Rfl: 3   Selexipag 600 MCG TABS, Take 300 mcg by mouth 2 (two) times daily., Disp: , Rfl:    torsemide (DEMADEX) 20 MG tablet, TAKE 4 TABLETS BY MOUTH EVERY MORNING AND 2 TABLETS EVERY EVENING, Disp: 540 tablet, Rfl: 3   TRELEGY ELLIPTA 200-62.5-25 MCG/INH AEPB, INHALE 1 PUFF INTO THE LUNGS DAILY, Disp: 60 each, Rfl: 3   warfarin (COUMADIN) 4 MG tablet, Take one-and-one-half (1&1/2) tablets all days of the week--EXCEPT on Tuesdays, Thursdays and Saturdays, take only one (1) tablet on these days. 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    Social History   Socioeconomic History   Marital status: Divorced    Spouse name: Not on file   Number of children: Not on file   Years of education: Not on file   Highest education  level: Not on file  Occupational History   Occupation: retired Pharmacist, hospital  Tobacco Use   Smoking status: Former    Packs/day: 1.50    Years: 23.00    Total pack years: 34.50    Types: Cigarettes    Quit date: 1995    Years since quitting: 28.7   Smokeless tobacco: Never  Vaping Use   Vaping Use: Never used  Substance and Sexual Activity   Alcohol use: Never   Drug use: Not Currently    Types: "Crack" cocaine    Comment: quit 2008   Sexual activity: Not on file  Other Topics Concern   Not on file  Social History Narrative   Not on file   Social Determinants of Health   Financial Resource Strain: Low Risk  (09/09/2019)   Overall Financial Resource Strain (CARDIA)    Difficulty of Paying Living Expenses: Not very hard  Food Insecurity: Not on file  Transportation Needs: Not on file  Physical Activity: Not on file  Stress: Not on file  Social Connections: Not on file   Family History  Adopted: Yes    ASSESSMENT Recent Results: The most recent result is correlated with 34 mg per week: Lab Results  Component Value Date   INR 2.0 09/03/2022   INR 2.8 07/30/2022   INR 2.9 07/02/2022    Anticoagulation Dosing: Description   Take one-and-one half (1 & 1/2) tablets on Sundays, Mondays, Wednesdays and Fridays. Take only one (1) tablet on Tuesdays, and Saturdays.     INR today: Therapeutic  PLAN Weekly dose was increased by 6% to 36 mg per week  Patient Instructions  Patient instructed to take medications as defined in the Anti-coagulation Track section of this encounter.  Patient instructed to take today's dose.  Patient instructed to take one-and-one half (1 & 1/2) tablets on Sundays, Mondays, Wednesdays and Fridays. Take only one (1) tablet on Tuesdays, Thursdays and Saturdays. Patient verbalized understanding of these instructions.  Patient advised to contact clinic or seek medical attention if signs/symptoms of bleeding or thromboembolism occur.  Patient  verbalized understanding by repeating back information and was advised to contact me if further medication-related questions arise. Patient was also provided an information handout.  Follow-up Return in 4 weeks (on 10/01/2022) for Follow up INR.  Pennie Banter, PharmD, CPP  15 minutes spent face-to-face with the patient during the encounter. 50% of time spent on education, including signs/sx bleeding and clotting, as well as food and drug interactions with warfarin. 50% of time was spent on fingerprick POC INR sample collection,processing, results determination, and documentation in [email or web address]/.

## 2022-09-03 NOTE — Telephone Encounter (Signed)
Patinet requests refill on warfarin. Have sent to his pharmacy of record.

## 2022-10-01 ENCOUNTER — Ambulatory Visit (INDEPENDENT_AMBULATORY_CARE_PROVIDER_SITE_OTHER): Payer: Medicare Other | Admitting: Pharmacist

## 2022-10-01 DIAGNOSIS — Z86711 Personal history of pulmonary embolism: Secondary | ICD-10-CM | POA: Diagnosis not present

## 2022-10-01 DIAGNOSIS — I2699 Other pulmonary embolism without acute cor pulmonale: Secondary | ICD-10-CM

## 2022-10-01 DIAGNOSIS — Z86718 Personal history of other venous thrombosis and embolism: Secondary | ICD-10-CM | POA: Diagnosis not present

## 2022-10-01 DIAGNOSIS — Z7901 Long term (current) use of anticoagulants: Secondary | ICD-10-CM | POA: Diagnosis present

## 2022-10-01 LAB — POCT INR: INR: 3 (ref 2.0–3.0)

## 2022-10-01 NOTE — Progress Notes (Signed)
Anticoagulation Management Jesus Russell is a 71 y.o. male who reports to the clinic for monitoring of warfarin treatment.    Indication: DVT and PE , History of; Long term current use of oral anticoagulant, warfarin. Target INR 2.0 - 3.0. Duration: indefinite Supervising physician:  Velna Ochs, MD  Anticoagulation Clinic Visit History: Patient does not report signs/symptoms of bleeding or thromboembolism  Other recent changes: No diet, medications, lifestyle changes endorsed.  Anticoagulation Episode Summary     Current INR goal:  2.0-3.0  TTR:  74.3 % (2.9 y)  Next INR check:  10/29/2022  INR from last check:  3.0 (10/01/2022)  Weekly max warfarin dose:    Target end date:  Indefinite  INR check location:    Preferred lab:    Send INR reminders to:     Indications   Long term (current) use of anticoagulants [Z79.01] History of DVT (deep vein thrombosis) [Z86.718] Pulmonary embolism on long-term anticoagulation therapy (HCC) [I26.99 Z79.01] History of pulmonary embolism [Z86.711]        Comments:           No Known Allergies  Current Outpatient Medications:    albuterol (VENTOLIN HFA) 108 (90 Base) MCG/ACT inhaler, Inhale 2 puffs into the lungs every 6 (six) hours as needed for wheezing or shortness of breath., Disp: 18 g, Rfl: 5   alendronate (FOSAMAX) 70 MG tablet, Take 1 tablet (70 mg total) by mouth once a week., Disp: 4 tablet, Rfl: 5   Ascorbic Acid (VITAMIN C) 500 MG CAPS, Take 500 mg by mouth daily., Disp: , Rfl:    cholecalciferol (VITAMIN D3) 25 MCG (1000 UT) tablet, Take 1,000 Units by mouth daily., Disp: , Rfl:    KLOR-CON 20 MEQ packet, DILUTE 3 PACKETS IN LIQUID AS DIRECTED AND TAKE BY MOUTH DAILY, Disp: 90 each, Rfl: 2   omega-3 acid ethyl esters (LOVAZA) 1 g capsule, Take 1 capsule (1 g total) by mouth 2 (two) times daily., Disp: 180 capsule, Rfl: 1   Polyethyl Glycol-Propyl Glycol (SYSTANE OP), Place 1-2 drops into both eyes as needed (Dry eyes).,  Disp: , Rfl:    Riociguat (ADEMPAS) 2.5 MG TABS, Take 2.5 mg by mouth in the morning, at noon, and at bedtime., Disp: 270 tablet, Rfl: 3   Selexipag 600 MCG TABS, Take 300 mcg by mouth 2 (two) times daily., Disp: , Rfl:    torsemide (DEMADEX) 20 MG tablet, TAKE 4 TABLETS BY MOUTH EVERY MORNING AND 2 TABLETS EVERY EVENING, Disp: 540 tablet, Rfl: 3   TRELEGY ELLIPTA 200-62.5-25 MCG/INH AEPB, INHALE 1 PUFF INTO THE LUNGS DAILY, Disp: 60 each, Rfl: 3   warfarin (COUMADIN) 4 MG tablet, Take one-and-one-half (1&1/2) tablets all days of the week--EXCEPT on Tuesdays, Thursdays and Saturdays, take only one (1) tablet on these days. Patient requests 90 day supply., Disp: 108 tablet, Rfl: 1   zinc gluconate 50 MG tablet, Take 50 mg by mouth daily., Disp: , Rfl:    acetaminophen (TYLENOL) 500 MG tablet, Take 1,000-1,500 mg by mouth every 6 (six) hours as needed for mild pain or moderate pain. (Patient not taking: Reported on 10/01/2022), Disp: , Rfl:    oxymetazoline (AFRIN) 0.05 % nasal spray, Place 1 spray into both nostrils daily as needed for congestion. 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    Social History   Socioeconomic History   Marital status: Divorced    Spouse name: Not on file    Number of children: Not on file   Years of education: Not on file   Highest education level: Not on file  Occupational History   Occupation: retired Pharmacist, hospital  Tobacco Use   Smoking status: Former    Packs/day: 1.50    Years: 23.00    Total pack years: 34.50    Types: Cigarettes    Quit date: 1995    Years since quitting: 28.7   Smokeless tobacco: Never  Vaping Use   Vaping Use: Never used  Substance and Sexual Activity   Alcohol use: Never   Drug use: Not Currently    Types: "Crack" cocaine    Comment: quit 2008   Sexual activity: Not on file  Other Topics Concern   Not on file  Social History Narrative   Not on file   Social Determinants of Health   Financial Resource Strain: Low Risk  (09/09/2019)   Overall Financial Resource Strain (CARDIA)    Difficulty of Paying Living Expenses: Not very hard  Food Insecurity: Not on file  Transportation Needs: Not on file  Physical Activity: Not on file  Stress: Not on file  Social Connections: Not on file   Family History  Adopted: Yes    ASSESSMENT Recent Results: The most recent result is correlated with 36 mg per week: Lab Results  Component Value Date   INR 3.0 10/01/2022   INR 2.0 09/03/2022   INR 2.8 07/30/2022    Anticoagulation Dosing: Description   Take one-and-one half (1 & 1/2) tablets on Mondays, Wednesdays and Fridays. Take only one (1) tablet on all other days.      INR today: Therapeutic  PLAN Weekly dose was decreased by 6% to 34 mg per week  Patient Instructions  Patient instructed to take medications as defined in the Anti-coagulation Track section of this encounter.  Patient instructed to take today's dose.  Patient instructed to take one-and-one half (1 & 1/2) tablets on Mondays, Wednesdays and Fridays. Take only one (1) tablet on all other days.  Patient verbalized understanding of these instructions.  Patient advised to contact clinic or seek medical attention if signs/symptoms of bleeding  or thromboembolism occur.  Patient verbalized understanding by repeating back information and was advised to contact me if further medication-related questions arise. Patient was also provided an information handout.  Follow-up Return in 4 weeks (on 10/29/2022) for Follow up INR.  Pennie Banter, PharmD, CPP  15 minutes spent face-to-face with the patient during the encounter. 50% of time spent on education, including signs/sx bleeding and clotting, as well as food and drug interactions with warfarin. 50% of time was spent on fingerprick POC INR sample collection,processing, results determination, and documentation in [email or web address]

## 2022-10-01 NOTE — Patient Instructions (Signed)
Patient instructed to take medications as defined in the Anti-coagulation Track section of this encounter.  Patient instructed to take today's dose.  Patient instructed to take one-and-one half (1 & 1/2) tablets on Mondays, Wednesdays and Fridays. Take only one (1) tablet on all other days.  Patient verbalized understanding of these instructions.

## 2022-10-23 ENCOUNTER — Telehealth (HOSPITAL_COMMUNITY): Payer: Self-pay | Admitting: Pharmacy Technician

## 2022-10-23 NOTE — Telephone Encounter (Signed)
Advanced Heart Failure Patient Advocate Encounter  Prior Authorization for Uptravi 230mg (8 tabs daily) has been approved.    PA # [id number] dates: 10/23/22 through 12/24/23  ECharlann Boxer CPhT

## 2022-10-29 ENCOUNTER — Ambulatory Visit (INDEPENDENT_AMBULATORY_CARE_PROVIDER_SITE_OTHER): Payer: Medicare Other | Admitting: Pharmacist

## 2022-10-29 DIAGNOSIS — Z7901 Long term (current) use of anticoagulants: Secondary | ICD-10-CM | POA: Diagnosis present

## 2022-10-29 DIAGNOSIS — Z86711 Personal history of pulmonary embolism: Secondary | ICD-10-CM | POA: Diagnosis not present

## 2022-10-29 DIAGNOSIS — I2699 Other pulmonary embolism without acute cor pulmonale: Secondary | ICD-10-CM

## 2022-10-29 DIAGNOSIS — Z86718 Personal history of other venous thrombosis and embolism: Secondary | ICD-10-CM | POA: Diagnosis not present

## 2022-10-29 LAB — POCT INR: INR: 2.8 (ref 2.0–3.0)

## 2022-10-29 NOTE — Patient Instructions (Signed)
Patient instructed to take medications as defined in the Anti-coagulation Track section of this encounter.  Patient instructed to take today's dose.  Patient instructed to take one-and-one half (1 & 1/2) tablets on Mondays, Wednesdays and Fridays. Take only one (1) tablet on all other days.  Patient verbalized understanding of these instructions.

## 2022-10-29 NOTE — Progress Notes (Signed)
Anticoagulation Management Jesus Russell is a 71 y.o. male who reports to the clinic for monitoring of warfarin treatment.    Indication: History of DVT; History of PE; History of PE on long term anticoagulation; Long term anticoagulation with warfarin. Target INR 2.0 - 3.0.  Duration: indefinite Supervising physician: [place] [facility] Visit History: Patient does not report signs/symptoms of bleeding or thromboembolism  Other recent changes: No diet, medications, lifestyle changes.  Anticoagulation Episode Summary     Current INR goal:  2.0-3.0  TTR:  75.0 % (3 y)  Next INR check:  12/31/2022  INR from last check:  2.8 (10/29/2022)  Weekly max warfarin dose:    Target end date:  Indefinite  INR check location:    Preferred lab:    Send INR reminders to:     Indications   Long term (current) use of anticoagulants [Z79.01] History of DVT (deep vein thrombosis) [Z86.718] Pulmonary embolism on long-term anticoagulation therapy (HCC) [I26.99 Z79.01] History of pulmonary embolism [Z86.711]        Comments:           No Known Allergies  Current Outpatient Medications:    albuterol (VENTOLIN HFA) 108 (90 Base) MCG/ACT inhaler, Inhale 2 puffs into the lungs every 6 (six) hours as needed for wheezing or shortness of breath., Disp: 18 g, Rfl: 5   alendronate (FOSAMAX) 70 MG tablet, Take 1 tablet (70 mg total) by mouth once a week., Disp: 4 tablet, Rfl: 5   Ascorbic Acid (VITAMIN C) 500 MG CAPS, Take 500 mg by mouth daily., Disp: , Rfl:    cholecalciferol (VITAMIN D3) 25 MCG (1000 UT) tablet, Take 1,000 Units by mouth daily., Disp: , Rfl:    KLOR-CON 20 MEQ packet, DILUTE 3 PACKETS IN LIQUID AS DIRECTED AND TAKE BY MOUTH DAILY, Disp: 90 each, Rfl: 2   omega-3 acid ethyl esters (LOVAZA) 1 g capsule, Take 1 capsule (1 g total) by mouth 2 (two) times daily., Disp: 180 capsule, Rfl: 1   oxymetazoline (AFRIN) 0.05 % nasal spray, Place 1 spray into both nostrils daily as  needed for congestion., Disp: , Rfl:    Polyethyl Glycol-Propyl Glycol (SYSTANE OP), Place 1-2 drops into both eyes as needed (Dry eyes)., Disp: , Rfl:    Riociguat (ADEMPAS) 2.5 MG TABS, Take 2.5 mg by mouth in the morning, at noon, and at bedtime., Disp: 270 tablet, Rfl: 3   Selexipag 600 MCG TABS, Take 300 mcg by mouth 2 (two) times daily., Disp: , Rfl:    torsemide (DEMADEX) 20 MG tablet, TAKE 4 TABLETS BY MOUTH EVERY MORNING AND 2 TABLETS EVERY EVENING, Disp: 540 tablet, Rfl: 3   TRELEGY ELLIPTA 200-62.5-25 MCG/INH AEPB, INHALE 1 PUFF INTO THE LUNGS DAILY, Disp: 60 each, Rfl: 3   warfarin (COUMADIN) 4 MG tablet, Take one-and-one-half (1&1/2) tablets all days of the week--EXCEPT on Tuesdays, Thursdays and Saturdays, take only one (1) tablet on these days. Patient requests 90 day supply., Disp: 108 tablet, Rfl: 1   zinc gluconate 50 MG tablet, Take 50 mg by mouth daily., Disp: , Rfl:    acetaminophen (TYLENOL) 500 MG tablet, Take 1,000-1,500 mg by mouth every 6 (six) hours as needed for mild pain or moderate pain. 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    Social History   Socioeconomic History   Marital status: Divorced    Spouse name: Not on file   Number of children: Not on file    Years of education: Not on file   Highest education level: Not on file  Occupational History   Occupation: retired Pharmacist, hospital  Tobacco Use   Smoking status: Former    Packs/day: 1.50    Years: 23.00    Total pack years: 34.50    Types: Cigarettes    Quit date: 1995    Years since quitting: 28.8   Smokeless tobacco: Never  Vaping Use   Vaping Use: Never used  Substance and Sexual Activity   Alcohol use: Never   Drug use: Not Currently    Types: "Crack" cocaine    Comment: quit 2008   Sexual activity: Not on file  Other Topics Concern   Not on file  Social History Narrative   Not on file   Social Determinants of Health   Financial Resource Strain: Low Risk  (09/09/2019)   Overall Financial Resource Strain (CARDIA)    Difficulty of Paying Living Expenses: Not very hard  Food Insecurity: Not on file  Transportation Needs: Not on file  Physical Activity: Not on file  Stress: Not on file  Social Connections: Not on file   Family History  Adopted: Yes    ASSESSMENT Recent Results: The most recent result is correlated with 34 mg per week: Lab Results  Component Value Date   INR 2.8 10/29/2022   INR 3.0 10/01/2022   INR 2.0 09/03/2022    Anticoagulation Dosing: Description   Take one-and-one half (1 & 1/2) tablets on Mondays, Wednesdays and Fridays. Take only one (1) tablet on all other days.      INR today: Therapeutic  PLAN Weekly dose was unchanged.  Patient Instructions  Patient instructed to take medications as defined in the Anti-coagulation Track section of this encounter.  Patient instructed to take today's dose.  Patient instructed to take one-and-one half (1 & 1/2) tablets on Mondays, Wednesdays and Fridays. Take only one (1) tablet on all other days.  Patient verbalized understanding of these instructions.  Patient advised to contact clinic or seek medical attention if signs/symptoms of bleeding or thromboembolism occur.  Patient verbalized  understanding by repeating back information and was advised to contact me if further medication-related questions arise. Patient was also provided an information handout.  Follow-up Return in 9 weeks (on 12/31/2022) for Follow up INR.  Pennie Banter, PharmD, CPP  15 minutes spent face-to-face with the patient during the encounter. 50% of time spent on education, including signs/sx bleeding and clotting, as well as food and drug interactions with warfarin. 50% of time was spent on fingerprick POC INR sample collection,processing, results determination, and documentation in [email or web address]/.

## 2022-11-13 ENCOUNTER — Ambulatory Visit: Payer: Medicare Other

## 2022-11-13 ENCOUNTER — Encounter: Payer: Medicare Other | Admitting: Student

## 2022-11-15 NOTE — Progress Notes (Signed)
Evaluation and management procedures were performed by the Clinical Pharmacy Practitioner under my supervision and collaboration. I have reviewed the Practitioner's note and chart, and I agree with the management and plan as documented above. ° °

## 2022-11-20 ENCOUNTER — Encounter (HOSPITAL_COMMUNITY): Payer: Self-pay | Admitting: Cardiology

## 2022-11-20 ENCOUNTER — Ambulatory Visit (HOSPITAL_COMMUNITY)
Admission: RE | Admit: 2022-11-20 | Discharge: 2022-11-20 | Disposition: A | Discharge status: Home / Self Care | Payer: Medicare Other | Source: Ambulatory Visit | Attending: Cardiology | Admitting: Cardiology

## 2022-11-20 VITALS — BP 104/60 | HR 77 | Wt 195.2 lb

## 2022-11-20 DIAGNOSIS — D6862 Lupus anticoagulant syndrome: Secondary | ICD-10-CM | POA: Diagnosis not present

## 2022-11-20 DIAGNOSIS — Z86711 Personal history of pulmonary embolism: Secondary | ICD-10-CM | POA: Diagnosis not present

## 2022-11-20 DIAGNOSIS — G4733 Obstructive sleep apnea (adult) (pediatric): Secondary | ICD-10-CM | POA: Insufficient documentation

## 2022-11-20 DIAGNOSIS — D869 Sarcoidosis, unspecified: Secondary | ICD-10-CM | POA: Diagnosis not present

## 2022-11-20 DIAGNOSIS — I50812 Chronic right heart failure: Secondary | ICD-10-CM

## 2022-11-20 DIAGNOSIS — Z7901 Long term (current) use of anticoagulants: Secondary | ICD-10-CM | POA: Diagnosis not present

## 2022-11-20 DIAGNOSIS — I509 Heart failure, unspecified: Secondary | ICD-10-CM | POA: Diagnosis not present

## 2022-11-20 DIAGNOSIS — I272 Pulmonary hypertension, unspecified: Secondary | ICD-10-CM | POA: Diagnosis present

## 2022-11-20 DIAGNOSIS — I110 Hypertensive heart disease with heart failure: Secondary | ICD-10-CM | POA: Insufficient documentation

## 2022-11-20 LAB — BASIC METABOLIC PANEL
Anion gap: 10 (ref 5–15)
BUN: 13 mg/dL (ref 8–23)
CO2: 32 mmol/L (ref 22–32)
Calcium: 9.1 mg/dL (ref 8.9–10.3)
Chloride: 95 mmol/L — ABNORMAL LOW (ref 98–111)
Creatinine, Ser: 1.13 mg/dL (ref 0.61–1.24)
GFR, Estimated: >60 mL/min (ref >60)
Glucose, Bld: 97 mg/dL (ref 70–99)
Potassium: 3.9 mmol/L (ref 3.5–5.1)
Sodium: 137 mmol/L (ref 135–145)

## 2022-11-20 LAB — BRAIN NATRIURETIC PEPTIDE: B Natriuretic Peptide: 30.2 pg/mL (ref 0.0–100.0)

## 2022-11-20 NOTE — Progress Notes (Signed)
PCP: Serita Butcher, MD HF Cardiology: Dr. Aundra Dubin  71 y.o. with history of sarcoidosis, prior PE, RV failure was referred by Dr. Shearon Stalls for evaluation of CHF/pulmonary hypertension.  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  Patient was on prednisone for a period of time, this was stopped earlier this year.  He is on 2 L home oxygen.  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 He has had recurrent PEs/DVTs.  He has an IVC filter.  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  Most recent PE was in 2/21 when he had been off warfarin transiently.  Last echo in 5/21 showed EF 55-60% with D-shaped septum and severely dilated/severely dysfunctional RV.  No estimation of PA pressure was available.    RHC in 7/21 showed normal PCWP and moderate PAH with PVR 5 WU. V/Q showed perfusion defects but thought to be due to sarcoidosis.  CTA chest in 7/21 showed no acute or chronic PE, signs of chronic sarcoidosis were present.   Echo (5/22) with EF 55-60%, moderate focal basal septal hypertrophy, severe RV enlargement with moderately decreased systolic function.   Patient has had to cut back on Uptravi due to joint pain and fatigue.  He is currently taking 600 mcg bid.  He stopped Opsumit due to severe peripheral edema. He tried restarting it, and the edema came back, so he stopped it for good.   Echo in 5/23 showed EF 55-60%, moderate RV enlargement with moderate RV dysfunction, PASP 57 mmHg. RHC was done in 7/23, showing normal filling pressures and moderate pulmonary arterial hypertension with preserved cardiac output.   Patient presents for followup of pulmonary hypertension/RV failure.  He is now using CPAP and 2-3 L home oxygen.  He is still short of breath walking across his house and walking up stairs. No chest pain.  No lightheadedness.  No palpitations.  Weight is stable. 6 minute walk improved today.  He has been able to titrate up  selexipag.   ECG (personally reviewed): NSR, PVC, RBBB  Labs (6/21): pro-BNP 43, K 4.6, creatinine 0.89 Labs (8/21): hgb 10.5, K 3.8, creatinine 1.28 Labs (10/21): K 5.4, creatinine 1.1 Labs (11/21): K 3.3, creatinine 1.14 Labs (1/22): BNP 365 Labs (2/22): K 4, creatinine 1.2 Labs (3/22): K 3.9, creatinine 1.49, BNP 21 Labs (6/22): BNP 23, K 5, creatinine 1.33 Labs (10/22): K 4, creatinine 1.25, BNP 48 Labs (2/23): K 4.6, creatinine 1.29 Labs (7/23): K 4.2, creatinine 1.08 Labs (8/23): BNP 26, K 4.6, creatinine 1.28  6 minute walk (11/21): 76 m 6 minute walk (1/22): 183 m 6 minute walk (3/22): 198 m 6 minute walk (5/22): 183 m 6 minute walk (10/22): 152 m 6 minute walk (1/23): 229 m 6 minute walk (8/23): 168 m 6 minute walk (11/23): 58 m  PMH: 1. Sarcoidosis: "Burned out."  Diagnosed in 1990 with bronchoscopy/biopsy.  Treated with prednisone, now off.  - On 2 L home oxygen chronically.  2. 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 H/o DVT and PE.  Has lupus anticoagulant.   - Most recent PE was in 2/21, he had been off warfarin transiently.  - V/Q scan in 7/21 with perfusion defects, CTA in 7/21 showed no acute or chronic thrombi, signs of sarcoidosis were present.  4. COVID-19 infection 1/21.  5. H/o compression fractures in 2/21.  - Had kyphoplasty 6. RV failure/pulmonary hypertension:  - Echo (5/21) with EF 55-60%, D-shaped interventricular septum, severely dilated RV with  severe systolic dysfunction, dilated IVC.  - RHC (7/21) with mean RA 9, PA 62/21 mean 39, mean PCWP 11, CI 2.64, PVR 5 WU.  - Did not tolerate Tyvaso.  - Echo (5/22): EF 55-60%, moderate focal basal septal hypertrophy, severe RV enlargement with moderately decreased systolic function.  - Unable to tolerate Opsumit.  - Echo (5/23): EF 55-60%, moderate RV enlargement with moderate RV dysfunction, PASP 57 mmHg.  - RHC (7/23): mean RA 6, PA 58/22 mean 38, mean PCWP 6, CI 3.01, PVR 4.8 7.  HTN 8. History of subdural hematoma remotely.   Social History   Socioeconomic History   Marital status: Divorced    Spouse name: Not on file   Number of children: Not on file   Years of education: Not on file   Highest education level: Not on file  Occupational History   Occupation: retired Pharmacist, hospital  Tobacco Use   Smoking status: Former    Packs/day: 1.50    Years: 23.00    Total pack years: 34.50    Types: Cigarettes    Quit date: 1995    Years since quitting: 28.9   Smokeless tobacco: Never  Vaping Use   Vaping Use: Never used  Substance and Sexual Activity   Alcohol use: Never   Drug use: Not Currently    Types: "Crack" cocaine    Comment: quit 2008   Sexual activity: Not on file  Other Topics Concern   Not on file  Social History Narrative   Not on file   Social Determinants of Health   Financial Resource Strain: Low Risk  (09/09/2019)   Overall Financial Resource Strain (CARDIA)    Difficulty of Paying Living Expenses: Not very hard  Food Insecurity: Not on file  Transportation Needs: Not on file  Physical Activity: Not on file  Stress: Not on file  Social Connections: Not on file  Intimate Partner Violence: Not on file   Family History  Adopted: Yes   ROS: All systems reviewed and negative except as per HPI.   Current Outpatient Medications  Medication Sig Dispense Refill   acetaminophen (TYLENOL) 500 MG tablet Take 1,000-1,500 mg by mouth every 6 (six) hours as needed for mild pain or moderate pain.     albuterol (VENTOLIN HFA) 108 (90 Base) MCG/ACT inhaler Inhale 2 puffs into the lungs every 6 (six) hours as needed for wheezing or shortness of breath. 18 g 5   alendronate (FOSAMAX) 70 MG tablet Take 1 tablet (70 mg total) by mouth once a week. 4 tablet 5   Ascorbic Acid (VITAMIN C) 500 MG CAPS Take 500 mg by mouth daily.     cholecalciferol (VITAMIN D3) 25 MCG (1000 UT) tablet Take 1,000 Units by mouth daily.     KLOR-CON 20 MEQ packet DILUTE 3  PACKETS IN LIQUID AS DIRECTED AND TAKE BY MOUTH DAILY 90 each 2   omega-3 acid ethyl esters (LOVAZA) 1 g capsule Take 1 capsule (1 g total) by mouth 2 (two) times daily. 180 capsule 1   oxymetazoline (AFRIN) 0.05 % nasal spray Place 1 spray into both nostrils daily as needed for congestion.     Polyethyl Glycol-Propyl Glycol (SYSTANE OP) Place 1-2 drops into both eyes as needed (Dry eyes).     Riociguat (ADEMPAS) 2.5 MG TABS Take 2.5 mg by mouth in the morning, at noon, and at bedtime. 270 tablet 3   Selexipag 800 MCG TABS Take 1 tablet by mouth 2 (two) times daily.  torsemide (DEMADEX) 20 MG tablet TAKE 4 TABLETS BY MOUTH EVERY MORNING AND 2 TABLETS EVERY EVENING 540 tablet 3   TRELEGY ELLIPTA 200-62.5-25 MCG/INH AEPB INHALE 1 PUFF INTO THE LUNGS DAILY 60 each 3   warfarin (COUMADIN) 4 MG tablet Take one-and-one-half (1&1/2) tablets all days of the week--EXCEPT on Tuesdays, Thursdays and Saturdays, take only one (1) tablet on these days. Patient requests 90 day supply. 108 tablet 1   zinc gluconate 50 MG tablet Take 50 mg by mouth daily.     No current facility-administered medications for this encounter.   BP 104/60   Pulse 77   Wt 88.5 kg (195 lb 3.2 oz)   SpO2 92% Comment: 4l n/c  BMI 31.51 kg/m  General: NAD, kyphosis Neck: No JVD, no thyromegaly or thyroid nodule.  Lungs: Clear to auscultation bilaterally with normal respiratory effort. CV: Nondisplaced PMI.  Heart regular S1/S2 with widely split S2, no S3/S4, no murmur.  No peripheral edema.  No carotid bruit.  Normal pedal pulses.  Abdomen: Soft, nontender, no hepatosplenomegaly, no distention.  Skin: Intact without lesions or rashes.  Neurologic: Alert and oriented x 3.  Psych: Normal affect. Extremities: No clubbing or cyanosis.  HEENT: Normal.   Assessment/Plan: 1. RV failure, pulmonary hypertension: Echo in 5/21 showed EF 55-60%, D-shaped septum with severe RV dilation/severe RV dysfunction.  [place] in 7/21 showed mildly  elevated RA pressure, moderate PAH with PVR 5 WU.  V/Q scan showed perfusion defects, but CTA chest in 7/21 showed no acute or chronic PE, V/Q findings were likely due to chronic sarcoidosis changes in lungs.  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  Echo in 5/22 showed mildly improved RV function compared to 5/21. Echo in 5/23 showed moderate RV dysfunction and moderate RV enlargement, PASP 57 mmHg.  RHC in 7/23 showed moderate pulmonary arterial hypertension, mildly lower than prior study.  He did not tolerate Tyvaso. He is now off Opsumit (cannot tolerate due to swelling).  NYHA class III chronically.  Not volume overloaded on exam.  6 minute walk was better today with uptitration of selexipag.  - Continue torsemide 80 qam/40 qpm.  BMET/BNP today.  - Off Opsumit, unable to tolerate.  - Continue Adempas 2.5 mg tid.   - He is on selexipag 800 mcg bid.  Continue slow titration up to 1600 mcg bid then will reassess.   - Continue home oxygen and CPAP.  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  Has positive lupus anticoagulant.  - Continue warfarin, should have Lovenox bridge if stops warfarin (had PE when off warfarin transiently in 2/21).  3. Sarcoidosis: History of "burned out" pulmonary sarcoidosis.  This may be the cause of his pulmonary hypertension.  No definitive evidence for cardiac sarcoidosis, but could arrange for cardiac MRI in the future to investigate. He is no longer on prednisone.  - Continue 2-3 L home oxygen.  4. HTN: BP controlled.  5. OSA: Use CPAP.   Followup 3 months     Loralie Champagne 11/20/2022

## 2022-11-20 NOTE — Patient Instructions (Signed)
6 minute walk test.   Labs done today. We will contact you only if your labs are abnormal.  No medication changes were made. Please continue all current medications as prescribed.  Your physician recommends that you schedule a follow-up appointment in: 3 months  If you have any questions or concerns before your next appointment please send [place] a message through [place] or call our office at [phone].    TO LEAVE A MESSAGE FOR THE NURSE SELECT OPTION 2, PLEASE LEAVE A MESSAGE INCLUDING: YOUR NAME DATE OF BIRTH CALL BACK NUMBER REASON FOR CALL**this is important as we prioritize the call backs  YOU WILL RECEIVE A CALL BACK THE SAME DAY AS LONG AS YOU CALL BEFORE 4:00 PM   Do the following things EVERYDAY: Weigh yourself in the morning before breakfast. Write it down and keep it in a log. Take your medicines as prescribed Eat low salt foods--Limit salt (sodium) to 2000 mg per day.  Stay as active as you can everyday Limit all fluids for the day to less than 2 liters   At the [facility], you and your health needs are our priority. As part of our continuing mission to provide you with exceptional heart care, we have created designated Provider Care Teams. These Care Teams include your primary Cardiologist (physician) and Advanced Practice Providers (APPs- Physician Assistants and Nurse Practitioners) who all work together to provide you with the care you need, when you need it.   You may see any of the following providers on your designated Care Team at your next follow up: Dr Glori Bickers Dr Haynes Kerns, NP Lyda Jester, [place] Audry Riles, PharmD   Please be sure to bring in all your medications bottles to every appointment.

## 2022-11-20 NOTE — Progress Notes (Signed)
6 Min Walk Test Completed  Pt ambulated 213.3 meters O2 Sat ranged 94%-86% on 4-6L  oxygen HR ranged 92-106

## 2022-11-21 ENCOUNTER — Telehealth: Payer: Self-pay | Admitting: Internal Medicine

## 2022-11-21 MED ORDER — TRELEGY ELLIPTA 200-62.5-25 MCG/ACT IN AEPB: 1.0000 | INHALATION_SPRAY | Freq: Every day | RESPIRATORY_TRACT | 5 refills | Status: DC

## 2022-11-21 NOTE — Telephone Encounter (Signed)
Rx for pt's Trelegy inhaler has been sent to preferred pharmacy for pt. Called and spoke with pt letting him know this had been done and he verbalized understanding. Nothing further needed.

## 2022-12-11 ENCOUNTER — Other Ambulatory Visit (HOSPITAL_COMMUNITY): Payer: Self-pay

## 2022-12-14 ENCOUNTER — Other Ambulatory Visit (HOSPITAL_COMMUNITY): Payer: Self-pay | Admitting: Cardiology

## 2022-12-31 ENCOUNTER — Ambulatory Visit (INDEPENDENT_AMBULATORY_CARE_PROVIDER_SITE_OTHER): Payer: Medicare Other | Admitting: Pharmacist

## 2022-12-31 DIAGNOSIS — Z86711 Personal history of pulmonary embolism: Secondary | ICD-10-CM | POA: Diagnosis not present

## 2022-12-31 DIAGNOSIS — Z86718 Personal history of other venous thrombosis and embolism: Secondary | ICD-10-CM

## 2022-12-31 DIAGNOSIS — Z7901 Long term (current) use of anticoagulants: Secondary | ICD-10-CM

## 2022-12-31 LAB — POCT INR: INR: 2.6 (ref 2.0–3.0)

## 2022-12-31 NOTE — Patient Instructions (Signed)
Patient instructed to take medications as defined in the Anti-coagulation Track section of this encounter.  Patient instructed to take today's dose.  Patient instructed to take one-and-one half (1 & 1/2) tablets on Mondays, Wednesdays and Fridays. Take only one (1) tablet on all other days.  Patient verbalized understanding of these instructions.

## 2022-12-31 NOTE — Progress Notes (Signed)
Anticoagulation Management Jesus Russell is a 72 y.o. male who reports to the clinic for monitoring of warfarin treatment.    Indication: DVT and PE, History of multiple occurrences; long term current use of oral anticoagulant, warfarin. Target INR range 2.0 - 3.0.  Duration: indefinite Supervising physician: Joni Reining  Anticoagulation Clinic Visit History: Patient does not report signs/symptoms of bleeding or thromboembolism  Other recent changes: No diet, medications, lifestyle changes.  Anticoagulation Episode Summary     Current INR goal:  2.0-3.0  TTR:  76.3 % (3.1 y)  Next INR check:  02/25/2023  INR from last check:  2.6 (12/31/2022)  Weekly max warfarin dose:    Target end date:  Indefinite  INR check location:    Preferred lab:    Send INR reminders to:     Indications   Long term (current) use of anticoagulants [Z79.01] History of DVT (deep vein thrombosis) [Z86.718] Pulmonary embolism on long-term anticoagulation therapy (HCC) [I26.99 Z79.01] History of pulmonary embolism [Z86.711]        Comments:           No Known Allergies  Current Outpatient Medications:    acetaminophen (TYLENOL) 500 MG tablet, Take 1,000-1,500 mg by mouth every 6 (six) hours as needed for mild pain or moderate pain., Disp: , Rfl:    albuterol (VENTOLIN HFA) 108 (90 Base) MCG/ACT inhaler, Inhale 2 puffs into the lungs every 6 (six) hours as needed for wheezing or shortness of breath., Disp: 18 g, Rfl: 5   alendronate (FOSAMAX) 70 MG tablet, Take 1 tablet (70 mg total) by mouth once a week., Disp: 4 tablet, Rfl: 5   Ascorbic Acid (VITAMIN C) 500 MG CAPS, Take 500 mg by mouth daily., Disp: , Rfl:    cholecalciferol (VITAMIN D3) 25 MCG (1000 UT) tablet, Take 1,000 Units by mouth daily., Disp: , Rfl:    Fluticasone-Umeclidin-Vilant (TRELEGY ELLIPTA) 200-62.5-25 MCG/ACT AEPB, Inhale 1 puff into the lungs daily., Disp: 60 each, Rfl: 5   KLOR-CON 20 MEQ packet, DISSOLVE 3 PACKETS IN LIQUID AND  TAKE BY MOUTH DAILY AS DIRECTED, Disp: 90 each, Rfl: 2   omega-3 acid ethyl esters (LOVAZA) 1 g capsule, Take 1 capsule (1 g total) by mouth 2 (two) times daily., Disp: 180 capsule, Rfl: 1   oxymetazoline (AFRIN) 0.05 % nasal spray, Place 1 spray into both nostrils daily as needed for congestion., Disp: , Rfl:    Polyethyl Glycol-Propyl Glycol (SYSTANE OP), Place 1-2 drops into both eyes as needed (Dry eyes)., Disp: , Rfl:    Riociguat (ADEMPAS) 2.5 MG TABS, Take 2.5 mg by mouth in the morning, at noon, and at bedtime., Disp: 270 tablet, Rfl: 3   Selexipag 800 MCG TABS, Take 1 tablet by mouth 2 (two) times daily., Disp: , Rfl:    torsemide (DEMADEX) 20 MG tablet, TAKE 4 TABLETS BY MOUTH EVERY MORNING AND 2 TABLETS EVERY EVENING, Disp: 540 tablet, Rfl: 3   warfarin (COUMADIN) 4 MG tablet, Take one-and-one-half (1&1/2) tablets all days of the week--EXCEPT on Tuesdays, Thursdays and Saturdays, take only one (1) tablet on these days. 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    Social History   Socioeconomic History   Marital status: Divorced    Spouse name: Not on file   Number of children: Not on file   Years of  education: Not on file   Highest education level: Not on file  Occupational History   Occupation: retired Pharmacist, hospital  Tobacco Use   Smoking status: Former    Packs/day: 1.50    Years: 23.00    Total pack years: 34.50    Types: Cigarettes    Quit date: 1995    Years since quitting: 29.0   Smokeless tobacco: Never  Vaping Use   Vaping Use: Never used  Substance and Sexual Activity   Alcohol use: Never   Drug use: Not Currently    Types: "Crack" cocaine    Comment: quit 2008   Sexual activity: Not on file  Other Topics Concern   Not on file  Social History Narrative   Not on file   Social Determinants of Health   Financial Resource Strain: Low Risk  (09/09/2019)   Overall Financial Resource Strain (CARDIA)    Difficulty of Paying Living Expenses: Not very hard  Food Insecurity: Not on file  Transportation Needs: Not on file  Physical Activity: Not on file  Stress: Not on file  Social Connections: Not on file   Family History  Adopted: Yes    ASSESSMENT Recent Results: The most recent result is correlated with 34 mg per week: Lab Results  Component Value Date   INR 2.6 12/31/2022   INR 2.8 10/29/2022   INR 3.0 10/01/2022    Anticoagulation Dosing: Description   Take one-and-one half (1 & 1/2) tablets on Mondays, Wednesdays and Fridays. Take only one (1) tablet on all other days.      INR today: Therapeutic  PLAN Weekly dose was unchanged. Continue taking a total of 34 milligrams of warfarin per week as:  1 & 1/2 x 47m (679mdose) on MWF; 1 x 4 mg all other days.   Patient Instructions  Patient instructed to take medications as defined in the Anti-coagulation Track section of this encounter.  Patient instructed to take today's dose.  Patient instructed to take one-and-one half (1 & 1/2) tablets on Mondays, Wednesdays and Fridays. Take only one (1) tablet on all other days.  Patient verbalized understanding of these instructions.  Patient advised to contact  clinic or seek medical attention if signs/symptoms of bleeding or thromboembolism occur.  Patient verbalized understanding by repeating back information and was advised to contact me if further medication-related questions arise. Patient was also provided an information handout.  Follow-up Return in 8 weeks (on 02/25/2023) for Follow up INR.  JaPennie BanterPharmD, CPP  15 minutes spent face-to-face with the patient during the encounter. 50% of time spent on education, including signs/sx bleeding and clotting, as well as food and drug interactions with warfarin. 50% of time was spent on fingerprick POC INR sample collection,processing, results determination, and documentation  in http://[email or web address].net/.

## 2023-01-02 ENCOUNTER — Ambulatory Visit (INDEPENDENT_AMBULATORY_CARE_PROVIDER_SITE_OTHER): Payer: Medicare Other | Admitting: Nurse Practitioner

## 2023-01-02 ENCOUNTER — Ambulatory Visit (INDEPENDENT_AMBULATORY_CARE_PROVIDER_SITE_OTHER): Payer: Medicare Other

## 2023-01-02 ENCOUNTER — Encounter: Payer: Self-pay | Admitting: Nurse Practitioner

## 2023-01-02 VITALS — BP 130/80 | HR 88 | Ht 66.0 in | Wt 185.2 lb

## 2023-01-02 DIAGNOSIS — I2782 Chronic pulmonary embolism: Secondary | ICD-10-CM

## 2023-01-02 DIAGNOSIS — D860 Sarcoidosis of lung: Secondary | ICD-10-CM

## 2023-01-02 DIAGNOSIS — J9611 Chronic respiratory failure with hypoxia: Secondary | ICD-10-CM | POA: Diagnosis not present

## 2023-01-02 DIAGNOSIS — D869 Sarcoidosis, unspecified: Secondary | ICD-10-CM

## 2023-01-02 DIAGNOSIS — J449 Chronic obstructive pulmonary disease, unspecified: Secondary | ICD-10-CM

## 2023-01-02 DIAGNOSIS — G4733 Obstructive sleep apnea (adult) (pediatric): Secondary | ICD-10-CM | POA: Diagnosis not present

## 2023-01-02 DIAGNOSIS — I2609 Other pulmonary embolism with acute cor pulmonale: Secondary | ICD-10-CM

## 2023-01-02 DIAGNOSIS — I2721 Secondary pulmonary arterial hypertension: Secondary | ICD-10-CM

## 2023-01-02 NOTE — Progress Notes (Signed)
_0  ID: Jesus Russell, male    DOB: 13-Jul-1951, 72 y.o.   MRN: [id number]  Chief Complaint  Patient presents with   Follow-up    Pt f/u he states that is cpap therapy is gong well, he is SOB but that is baseline. He is current;y using 4L O2 continuous and w/ POC    Referring provider: Serita Butcher, MD  HPI: 72 year old male, former smoker followed for burnt-out pulmonary sarcoidosis, chronic respiratory failure on supplemental O2, pulmonary hypertension and OSA on CPAP.  He is a patient Dr. Mauricio Po and last seen in office 04/19/2022.  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   TEST/EVENTS:  03/31/2018 PFT: FVC 49%, FEV1 49%, ratio 70, DLCO 44% 07/15/2020 CTA chest: atherosclerosis. Stable prominent mediastinal nodes. Widespread chronic lung disease with superior hilar retraction, architectural distortion, btx and parenchymal scarring related to pulmonary sarcoid. T10 mildly progressed fx, T7 new fx.  10/05/2020 HST: AHI 55.6/h. SpO2 low 65%, average 84% 04/27/2022 echo: EF 55-60%, mild LVH. GIDD. RV moderately reduced and enlarged. Moderately elevated PASP. RA mildly dilated.   04/19/2022: OV with Dr. Shearon Stalls.  Chronic sarcoidosis diagnosed in 1990s with biopsy.  He has group 5 pulmonary hypertension due to sarcoid, on Adempas and Uptravi.  CPAP download reviewed with good adherence and mild residual AHI 8.9/h.  Does have some leak as well.  Has chronic PE and is on warfarin and also has IVC filter in place.  He is on Trelegy for maintenance inhaler.  Trying to lose some weight.  Currently on supplemental oxygen 3 L/min.  Subjectively stable sarcoid symptoms.  Radiographically his sarcoid is burned-out and fibrotic and unlikely to respond to therapy.  Can get PFTs at next visit if he perceives of worsening of his symptoms.  01/02/2023:  Today-follow-up Patient presents today for overdue follow-up.  He was last seen by Dr. Shearon Stalls in April 2023.  Since he was here last, he underwent RHC in July which showed normal filling pressures and moderate pulmonary arterial hypertension with preserved cardiac output. He had 6 MWT which was improved; however, he did require 4-6 lpm supplemental oxygen during his walk to maintain saturations >88%. He has still only been using 4 lpm on his POC and 4-5 lpm at home because his concentrator doesn't go higher than this. He tells me that his breathing has felt stable. He still gets short winded with daily household chores and walking longer distances. Occasional dry cough. Weights have been stable. He's still working on weight loss measures. Denies lower extremity swelling, orthopnea, PND, hemoptysis, fevers, chills, hemoptysis. He is using Trelegy right now; would like to see if there is an alternative option that works better or he can use more often. He is still on adempas, torsemide, and Uptravi. Wears CPAP nightly.  12/03/2022-01/01/2023: CPAP 5-15 cmH2O 30/30 days; 100% >4 hr; av usage 9 hr 23 min Pressure median 6.6, 95th 11.9 Leaks 95th 40.9 AHI 4.8   No Known Allergies  Immunization History  Administered Date(s) Administered   Fluad Quad(high Dose 65+) 09/21/2020, 09/04/2021   Influenza,inj,Quad PF,6+ Mos 09/09/2019   PFIZER(Purple Top)SARS-COV-2 Vaccination 04/09/2020, 05/02/2020, 11/25/2020   Pneumococcal Conjugate-13 02/06/2021    Past Medical History:  Diagnosis Date   Acute on chronic respiratory failure with hypoxia ([place])    COVID-19 virus infection    DVT of lower extremity, bilateral ([place])  Epistaxis    Hypertension    Incarcerated umbilical hernia 62/7035   Status post repair   Lupus anticoagulant positive    Per records from [facility]   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     Tobacco History: Social History   Tobacco Use  Smoking Status Former   Packs/day: 1.50   Years: 23.00   Total pack years: 34.50   Types: Cigarettes   Quit date: 1995   Years since quitting: 29.0  Smokeless Tobacco Never   Counseling given: Not Answered   Outpatient Medications Prior to Visit  Medication Sig Dispense Refill   acetaminophen (TYLENOL) 500 MG tablet Take 1,000-1,500 mg by mouth every 6 (six) hours as needed for mild pain or moderate pain.     albuterol (VENTOLIN HFA) 108 (90 Base) MCG/ACT inhaler Inhale 2 puffs into the lungs every 6 (six) hours as needed for wheezing or shortness of breath. 18 g 5   alendronate (FOSAMAX) 70 MG tablet Take 1 tablet (70 mg total) by mouth once a week. 4 tablet 5   Ascorbic Acid (VITAMIN C) 500 MG CAPS Take 500 mg by mouth daily.     cholecalciferol (VITAMIN D3) 25 MCG (1000 UT) tablet Take 1,000 Units by mouth daily.     Fluticasone-Umeclidin-Vilant (TRELEGY ELLIPTA) 200-62.5-25 MCG/ACT AEPB Inhale 1 puff into the lungs daily. 60 each 5   KLOR-CON 20 MEQ packet DISSOLVE 3 PACKETS IN LIQUID AND TAKE BY MOUTH DAILY AS DIRECTED 90 each 2   omega-3 acid ethyl esters (LOVAZA) 1 g capsule Take 1 capsule (1 g total) by mouth 2 (two) times daily. 180 capsule 1   oxymetazoline (AFRIN) 0.05 % nasal spray Place 1 spray into both nostrils daily as needed for congestion.     Polyethyl Glycol-Propyl Glycol (SYSTANE OP) Place 1-2 drops into both eyes as needed (Dry eyes).     Riociguat (ADEMPAS) 2.5 MG TABS Take 2.5 mg by mouth in the morning, at noon, and at bedtime. 270 tablet 3   Selexipag 800 MCG TABS Take 1 tablet by mouth 2 (two) times daily.     torsemide (DEMADEX) 20 MG tablet TAKE 4 TABLETS  BY MOUTH EVERY MORNING AND 2 TABLETS EVERY EVENING 540 tablet 3   warfarin (COUMADIN) 4 MG tablet Take one-and-one-half (1&1/2) tablets all days of the week--EXCEPT on Tuesdays, Thursdays and Saturdays, take only one (1) tablet on these days. Patient requests 90 day supply. 108 tablet 1   zinc gluconate 50 MG tablet Take 50 mg by mouth daily.     No facility-administered medications prior to visit.     Review of Systems:   Constitutional: No weight loss or gain, night sweats, fevers, chills. +fatigue, lassitude. HEENT: No headaches, difficulty swallowing, tooth/dental problems, or sore throat. No sneezing, itching, ear ache, nasal congestion, or post nasal drip CV:  No chest pain, orthopnea, PND, swelling in lower extremities, anasarca, dizziness, palpitations, syncope Resp: +shortness of breath with exertion; occasional dry cough. No excess mucus or change in color of mucus. No hemoptysis. No wheezing.  No chest wall deformity GI:  No heartburn, indigestion, abdominal pain, loss of appetite GU:  No dysuria, change in color of urine, urgency or frequency. Skin: No rash, lesions, ulcerations MSK:  No joint pain or swelling.   Neuro: No dizziness or lightheadedness.  Psych: No depression or anxiety. Mood stable.     Physical Exam:  BP 130/80   Pulse 88   Ht _0  (1.676 m)   Wt 185 lb 3.2 oz (84 kg)   SpO2 90%   BMI 29.89 kg/m   GEN: Pleasant, interactive, chronically-ill appearing; in no acute distress. HEENT:  Normocephalic and atraumatic. PERRLA. Sclera white. Nasal turbinates pink, moist and patent bilaterally. No rhinorrhea present. Oropharynx pink and moist, without exudate or edema. No lesions, ulcerations, or postnasal drip.  NECK:  Supple w/ fair ROM. No JVD present. Normal carotid impulses w/o bruits. Thyroid symmetrical with no goiter or nodules palpated. No lymphadenopathy.   CV: RRR, no m/r/g, no peripheral edema. Pulses intact, +2 bilaterally. No cyanosis, pallor or  clubbing. PULMONARY:  Unlabored, regular breathing. Minimal rhonchi bases otherwise clear bilaterally A&P w/o wheezes/rales/rhonchi. No accessory muscle use.  GI: BS present and normoactive. Soft, non-tender to palpation. No organomegaly or masses detected.  MSK: No erythema, warmth or tenderness. Cap refil <2 sec all extrem. No deformities or joint swelling noted.  Neuro: A/Ox3. No focal deficits noted.   Skin: Warm, no lesions or rashe Psych: Normal affect and behavior. Judgement and thought content appropriate.     Lab Results:  CBC    Component Value Date/Time   WBC 9.8 06/29/2022 0705   RBC 4.10 (L) 06/29/2022 0705   HGB 10.2 (L) 06/29/2022 0858   HGB 10.2 (L) 06/29/2022 0858   HGB 11.7 (L) 03/31/2020 1412   HCT 30.0 (L) 06/29/2022 0858   HCT 30.0 (L) 06/29/2022 0858   HCT 36.3 (L) 03/31/2020 1412   PLT 406 (H) 06/29/2022 0705   PLT 331 03/31/2020 1412   MCV 80.2 06/29/2022 0705   MCV 100 (H) 03/31/2020 1412   MCH 22.0 (L) 06/29/2022 0705   MCHC 27.4 (L) 06/29/2022 0705   RDW 21.9 (H) 06/29/2022 0705   RDW 14.3 03/31/2020 1412   LYMPHSABS 1.2 05/20/2020 1229   LYMPHSABS 2.3 03/31/2020 1412   MONOABS 1.2 (H) 05/20/2020 1229   EOSABS 0.0 05/20/2020 1229   EOSABS 0.2 03/31/2020 1412   BASOSABS 0.0 05/20/2020 1229   BASOSABS 0.1 03/31/2020 1412    BMET    Component Value Date/Time   NA 137 11/20/2022 1555   NA 142 08/03/2020 1539   K 3.9 11/20/2022 1555   CL 95 (L) 11/20/2022 1555   CO2 32 11/20/2022 1555   GLUCOSE 97 11/20/2022 1555   BUN 13 11/20/2022 1555   BUN 14 08/03/2020 1539   CREATININE 1.13 11/20/2022 1555   CALCIUM 9.1 11/20/2022 1555   GFRNONAA >60 11/20/2022 1555   GFRAA >60 09/21/2020 1146    BNP    Component Value Date/Time   BNP 30.2 11/20/2022 1555     Imaging:  No results found.        No data to display          No results found for: "NITRICOXIDE"      Assessment & Plan:   Sarcoidosis Stage IV Sarcoidosis.  Burned out on imaging; unlikely to respond to therapy. Stable with no new systemic symptoms. No perceived worsening. He has had increased O2 requirements over the past few months. Seems stable today compared to his 6MWT with HF clinic in November 2023. This does not appear to be  new onset; however, we will obtain CXR to evaluate for acute changes.   Patient Instructions  -Stop Trelegy. Trial Breztri 2 puffs Twice daily. Brush tongue and rinse mouth afterwards. If you do not have any added benefit, you can go back to your Trelegy. Otherwise, call me and I will send in a new prescription -Continue Albuterol inhaler 2 puffs every 6 hours as needed for shortness of breath or wheezing. Notify if symptoms persist despite rescue inhaler/neb use.  -Continue adempas, torsemide, selexipag as prescribed by Dr. Aundra Dubin. Monitor weights at home. Notify your heart doctor of increase of 2-3 lb overnight or 5 lb in a week   Continue supplemental oxygen 4 lpm at rest and 6 lpm with activity for goal >88-90%. You need to use continuous portable tanks instead of your POC from now on. I have sent orders to the medical supply company to bring these out for you.  I have also ordered an oxymizer nasal cannula for you to use with your portable tanks. You can place your portable tank on 3.5 lpm with this type of cannula and still receive 6 liters   Continue CPAP nightly; we will send orders for a new mask and adjust your settings from 5-15 to 6-12 cmH2O to see if this controls your leaks better  Follow up in 6 weeks with Dr. Shearon Stalls or Joellen Jersey Aigner Horseman,NP to see how CPAP adjustments have helped. If symptoms do not improve or worsen, please contact office for sooner follow up or seek emergency care.    Chronic respiratory failure (HCC) Desaturation at rest to 86% on 4-5 lpm POC; unable to maintain on this. Walking oximetry - required 6 lpm to maintain saturations >88-90%. We will send orders for him for portable tanks and oxymizer  tubing to use with these. He will also need a new concentrator. Spoke with [employer] who will be reaching out to him today. Repeat ONO to determine O2 requirement with CPAP.  COPD (chronic obstructive pulmonary disease) (HCC) Severe obstruction on previous PFTs. Does not appear to be in acute exacerbation. High symptom burden at baseline, which is multifactorial. He is currently maintained on Trelegy; we will trial him on Breztri - provided with samples today. If he has any perceived benefit, we will send in new rx for him. Continue PRN SABA.  PAH (pulmonary artery hypertension) (HCC) PAH with right heart failure. Compensated on current regimen of Adempas, Uptravi, and torsemide. Weight stable today. Appears euvolemic. Follow up with Dr. Aundra Dubin as scheduled.   Obstructive sleep apnea Severe OSA on CPAP. He has excellent compliance and continues to receive good benefit from use. Residual AHI is 4.8 and he is having significant leaks. He tells me that his masks seem to come lose after 2 weeks. We will try changing him to a Dream[employer]ear full face mask and tighten his settings to 6-12 cmH2O. Cautioned on safe driving practices. Reassess at follow up.   I spent 45 minutes of dedicated to the care of this patient on the date of this encounter to include pre-visit review of records, face-to-face time with the patient discussing conditions above, post visit ordering of testing, clinical documentation with the electronic health record, making appropriate referrals as documented, and communicating necessary findings to members of the patients care team.  Clayton Bibles, NP 01/02/2023  Pt aware and understands NP's role.

## 2023-01-02 NOTE — Assessment & Plan Note (Signed)
PAH with right heart failure. Compensated on current regimen of Adempas, Uptravi, and torsemide. Weight stable today. Appears euvolemic. Follow up with Dr. Aundra Dubin as scheduled.

## 2023-01-02 NOTE — Assessment & Plan Note (Signed)
Stage IV Sarcoidosis. Burned out on imaging; unlikely to respond to therapy. Stable with no new systemic symptoms. No perceived worsening. He has had increased O2 requirements over the past few months. Seems stable today compared to his 6MWT with HF clinic in November 2023. This does not appear to be new onset; however, we will obtain CXR to evaluate for acute changes.   Patient Instructions  -Stop Trelegy. Trial Breztri 2 puffs Twice daily. Brush tongue and rinse mouth afterwards. If you do not have any added benefit, you can go back to your Trelegy. Otherwise, call me and I will send in a new prescription -Continue Albuterol inhaler 2 puffs every 6 hours as needed for shortness of breath or wheezing. Notify if symptoms persist despite rescue inhaler/neb use.  -Continue adempas, torsemide, selexipag as prescribed by Dr. Aundra Dubin. Monitor weights at home. Notify your heart doctor of increase of 2-3 lb overnight or 5 lb in a week   Continue supplemental oxygen 4 lpm at rest and 6 lpm with activity for goal >88-90%. You need to use continuous portable tanks instead of your POC from now on. I have sent orders to the medical supply company to bring these out for you.  I have also ordered an oxymizer nasal cannula for you to use with your portable tanks. You can place your portable tank on 3.5 lpm with this type of cannula and still receive 6 liters   Continue CPAP nightly; we will send orders for a new mask and adjust your settings from 5-15 to 6-12 cmH2O to see if this controls your leaks better  Follow up in 6 weeks with Dr. Shearon Stalls or Joellen Jersey Joshoa Shawler,NP to see how CPAP adjustments have helped. If symptoms do not improve or worsen, please contact office for sooner follow up or seek emergency care.

## 2023-01-02 NOTE — Patient Instructions (Addendum)
-  Stop Trelegy. Trial Breztri 2 puffs Twice daily. Brush tongue and rinse mouth afterwards. If you do not have any added benefit, you can go back to your Trelegy. Otherwise, call me and I will send in a new prescription -Continue Albuterol inhaler 2 puffs every 6 hours as needed for shortness of breath or wheezing. Notify if symptoms persist despite rescue inhaler/neb use.  -Continue adempas, torsemide, selexipag as prescribed by Dr. Aundra Dubin. Monitor weights at home. Notify your heart doctor of increase of 2-3 lb overnight or 5 lb in a week   Continue supplemental oxygen 4 lpm at rest and 6 lpm with activity for goal >88-90%. You need to use continuous portable tanks instead of your POC from now on. I have sent orders to the medical supply company to bring these out for you.  I have also ordered an oxymizer nasal cannula for you to use with your portable tanks. You can place your portable tank on 3.5 lpm with this type of cannula and still receive 6 liters   Continue CPAP nightly; we will send orders for a new mask and adjust your settings from 5-15 to 6-12 cmH2O to see if this controls your leaks better  Follow up in 6 weeks with Dr. Shearon Stalls or Jesus Jersey Afnan Emberton,NP to see how CPAP adjustments have helped. If symptoms do not improve or worsen, please contact office for sooner follow up or seek emergency care.

## 2023-01-02 NOTE — Assessment & Plan Note (Signed)
Severe obstruction on previous PFTs. Does not appear to be in acute exacerbation. High symptom burden at baseline, which is multifactorial. He is currently maintained on Trelegy; we will trial him on Breztri - provided with samples today. If he has any perceived benefit, we will send in new rx for him. Continue PRN SABA.

## 2023-01-02 NOTE — Assessment & Plan Note (Signed)
Severe OSA on CPAP. He has excellent compliance and continues to receive good benefit from use. Residual AHI is 4.8 and he is having significant leaks. He tells me that his masks seem to come lose after 2 weeks. We will try changing him to a DreamWear full face mask and tighten his settings to 6-12 cmH2O. Cautioned on safe driving practices. Reassess at follow up.

## 2023-01-02 NOTE — Assessment & Plan Note (Signed)
Desaturation at rest to 86% on 4-5 lpm POC; unable to maintain on this. Walking oximetry - required 6 lpm to maintain saturations >88-90%. We will send orders for him for portable tanks and oxymizer tubing to use with these. He will also need a new concentrator. Spoke with [employer] who will be reaching out to him today. Repeat ONO to determine O2 requirement with CPAP.

## 2023-01-07 NOTE — Progress Notes (Signed)
Attempted to contact 1/12 but no answer. Please notify patient that his CXR showed a 2.4 cm density in the left base. Possibly related to his chronic lung disease but we need a CT chest with contrast for further evaluation. Thanks.

## 2023-01-08 ENCOUNTER — Encounter: Payer: Self-pay | Admitting: Student

## 2023-01-08 ENCOUNTER — Ambulatory Visit (INDEPENDENT_AMBULATORY_CARE_PROVIDER_SITE_OTHER): Payer: Medicare Other | Admitting: Student

## 2023-01-08 ENCOUNTER — Ambulatory Visit: Payer: Medicare Other

## 2023-01-08 VITALS — BP 122/74 | HR 78 | Temp 98.3°F | Wt 191.0 lb

## 2023-01-08 DIAGNOSIS — Z0000 Encounter for general adult medical examination without abnormal findings: Secondary | ICD-10-CM

## 2023-01-08 DIAGNOSIS — Z23 Encounter for immunization: Secondary | ICD-10-CM

## 2023-01-08 NOTE — Assessment & Plan Note (Signed)
Obtained PCV-20 vaccine today. Also received influenza vaccination today.

## 2023-01-08 NOTE — Patient Instructions (Addendum)
Mr. Aydelott,  It was great to see you in clinic today. We completed your Annual Wellness Visit today.  It is a pleasure to be a part of your team, thank you for allowing [place] to be a part of your care,  Max and Dr. Allyson Sabal  If you need medication refills please notify your pharmacy one week in advance and they will send [place] a request.

## 2023-01-08 NOTE — Progress Notes (Signed)
This AWV is being conducted in-person at [facility]  Subjective:   Jesus Russell is a 72 y.o. male who presents for a Medicare Annual Wellness Visit.  The following items have been reviewed and updated today in the appropriate area in the EMR.   Health Risk Assessment  Height, weight, BMI, and BP Visual acuity if needed Depression screen Fall risk / safety level Advance directive discussion Medical and family history were reviewed and updated Updating list of other providers & suppliers Medication reconciliation, including over the counter medicines Cognitive screen Written screening schedule Risk Factor list Personalized health advice, risky behaviors, and treatment advice  Social History   Social History Narrative   Not on file         Objective:    Vitals: BP 122/74 (BP Location: Left Arm, Patient Position: Sitting, Cuff Size: Normal)   Pulse 78   Temp 98.3 F (36.8 C) (Oral)   Wt 191 lb (86.6 kg)   SpO2 96%   BMI 30.83 kg/m  Vitals are [facility] performed  Activities of Daily Living    01/08/2023    3:07 PM 03/27/2022    3:07 PM  In your present state of health, do you have any difficulty performing the following activities:  Hearing? 0 0  Vision? 0 0  Difficulty concentrating or making decisions? 0 0  Walking or climbing stairs? 1 0  Dressing or bathing? 1 0  Doing errands, shopping? 1 0    Goals  Goals   None     Fall Risk    01/08/2023    3:06 PM 03/27/2022    3:07 PM 05/08/2021    1:47 PM 02/06/2021    3:13 PM 08/03/2020    2:00 PM  [place] in the past year? 0 0 0 0 0  Number falls in past yr: 0 0 0 0   Injury with Fall? 0 0 0 0   Risk for fall due to : No Fall Risks    Other (Comment);Impaired balance/gait  Risk for fall due to: Comment     sob.  Follow up Falls evaluation completed Falls evaluation completed Falls evaluation completed  Falls prevention discussed    Depression Screen    03/27/2022    3:09 PM 01/05/2021    1:31 PM  11/04/2020    9:55 AM 11/04/2020    9:51 AM  PHQ 2/9 Scores  PHQ - 2 Score 0 0 0 0  PHQ- 9 Score  3 4   PHQ-2 Score (01/08/2023) -- 0  Cognitive Testing Mini-Cog score: 4/5 (word-recall 2/3; clock draw 2/2)     Assessment and Plan:    During the course of the visit the patient was educated and counseled about appropriate screening and preventive services as documented in the assessment and plan.  The printed AVS was given to the patient and included an updated screening schedule, a list of risk factors, and personalized health advice.     Virl Axe, MD  01/08/2023

## 2023-01-08 NOTE — Progress Notes (Deleted)
Subjective:   Patient ID: Jesus Russell male   DOB: 1951-11-24 72 y.o.   MRN: [id number]  HPI: Jesus Russell is a 72 y.o. man . Please see problem-based assessment and plan charting for further details.  Review of Systems: Pertinent items are noted in HPI.  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[place]) 05/20/2020   Chronic respiratory failure ([place])    Personal history of COVID-19    Back pain 01/25/2020   Compression fracture of T6 vertebra ([place]) 01/20/2020   Cubital tunnel syndrome on right 01/20/2020   Osteoporosis 01/20/2020   PAH (pulmonary artery hypertension) ([place]) 10/29/2019   Hepatic steatosis 10/29/2019   Cholelithiasis 10/29/2019   Obstructive sleep apnea  10/29/2019   Need for pneumococcal vaccine 10/29/2019   Hypertension 10/06/2019   Encounter for central line placement 10/06/2019   Long term (current) use of anticoagulants 09/14/2019   History of DVT (deep vein thrombosis) 09/14/2019   History of pulmonary embolism 09/14/2019   Pulmonary embolism on long-term anticoagulation therapy ([place]) 09/09/2019   Sarcoidosis 09/09/2019     Current Outpatient Medications  Medication Sig Dispense Refill   acetaminophen (TYLENOL) 500 MG tablet Take 1,000-1,500 mg by mouth every 6 (six) hours as needed for mild pain or moderate pain.     albuterol (VENTOLIN HFA) 108 (90 Base) MCG/ACT inhaler Inhale 2 puffs into the lungs every 6 (six) hours as needed for wheezing or shortness of breath. 18 g 5   alendronate (FOSAMAX) 70 MG tablet Take 1 tablet (70 mg total) by mouth once a week. 4 tablet 5   Ascorbic Acid (VITAMIN C) 500 MG CAPS Take 500 mg by mouth daily.     cholecalciferol (VITAMIN D3) 25 MCG (1000 UT) tablet Take 1,000 Units by mouth daily.     Fluticasone-Umeclidin-Vilant (TRELEGY ELLIPTA) 200-62.5-25 MCG/ACT AEPB Inhale 1 puff into the lungs daily. 60 each 5   KLOR-CON 20 MEQ packet DISSOLVE 3 PACKETS IN LIQUID AND TAKE BY MOUTH DAILY AS DIRECTED 90 each 2   omega-3 acid ethyl esters (LOVAZA) 1 g capsule Take 1 capsule (1 g total) by mouth 2 (two) times daily. 180 capsule 1   oxymetazoline (AFRIN) 0.05 % nasal spray Place 1 spray  into both nostrils daily as needed for congestion.     Polyethyl Glycol-Propyl Glycol (SYSTANE OP) Place 1-2 drops into both eyes as needed (Dry eyes).     Riociguat (ADEMPAS) 2.5 MG TABS Take 2.5 mg by mouth in the morning, at noon, and at bedtime. 270 tablet 3   Selexipag 800 MCG TABS Take 1 tablet by mouth 2 (two) times daily.     torsemide (DEMADEX) 20 MG tablet TAKE 4 TABLETS BY MOUTH EVERY MORNING AND 2 TABLETS EVERY EVENING 540 tablet 3   warfarin (COUMADIN) 4 MG tablet Take one-and-one-half (1&1/2) tablets all days  of the week--EXCEPT on Tuesdays, Thursdays and Saturdays, take only one (1) tablet on these days. Patient requests 90 day supply. 108 tablet 1   zinc gluconate 50 MG tablet Take 50 mg by mouth daily.     No current facility-administered medications for this visit.     Objective:   Physical Exam: There were no vitals filed for this visit.  Constitutional: well appearing and sitting in bed, in no acute distress HENT: normocephalic atraumatic, mucous membranes moist Eyes: pupils equal and round, conjunctiva non-erythematous Cardiovascular: regular rate with normal rhythm, no m/r/g Pulmonary/Chest: normal work of breathing on room air, lungs clear to auscultation bilaterally Abdominal: soft, non-tender, non-distended, bowel sounds present MSK: normal bulk and tone, no peripheral edema Skin: warm and dry. Neurological: alert and answering questions appropriately. Psych: appropriate mood and affect   Assessment & Plan:   No problem-specific Assessment & Plan notes found for this encounter.    Kele Withem (Max) [place], MS3 01/08/2023, 3:01 PM

## 2023-01-09 NOTE — Progress Notes (Signed)
Internal Medicine Clinic Attending  Case discussed with Dr. Jinwala  At the time of the visit.  We reviewed the resident's history and exam and pertinent patient test results.  I agree with the assessment, diagnosis, and plan of care documented in the resident's note.  

## 2023-01-16 ENCOUNTER — Telehealth: Payer: Self-pay | Admitting: Nurse Practitioner

## 2023-01-16 NOTE — Progress Notes (Addendum)
01/16/2023 Addendum: ONO with hypoxia despite CPAP use and 4 lpm supplemental O2. Given his chronic hypercarbia, severe lung disease, and severe PAH, I think he would benefit from NIV. Orders placed. Will repeat ONO once on NIV with 4 lpm supplemental O2 bled through.  The patient continues to exhibit signs of hypercapnea associated with chronic respiratory failure secondary to severe COPD.  Interruption or failure to provide NIV would quickly lead to exacerbation of the patient's condition, hospital readmission, and likely harm the patient.  Continued use is preferred.  The use of the NIV will treat the patient's PCO2 levels and can reduce the risk of exacerbations and future hospitalizations when used at night and during the day.  Bilevel/RAD therapy with and without a rate would be ineffective as the patient requires a volume targeted mode.  Ventilation is required to decrease the work of breathing and improve pulmonary status.  Interruption of ventilator support would lead to a decline of health status.  Patient is able to protect their airways and clear secretions on their own.

## 2023-01-17 ENCOUNTER — Other Ambulatory Visit: Payer: Self-pay

## 2023-01-17 DIAGNOSIS — G4733 Obstructive sleep apnea (adult) (pediatric): Secondary | ICD-10-CM

## 2023-01-17 DIAGNOSIS — D860 Sarcoidosis of lung: Secondary | ICD-10-CM

## 2023-01-17 NOTE — Telephone Encounter (Signed)
Was finally able to get in touch with pt regarding CXR and results of ONO he verbalized understanding. NIV w/ O2 bled through has been ordered along with the CT per [facility] result note from the CXR

## 2023-01-21 ENCOUNTER — Telehealth: Payer: Self-pay | Admitting: Nurse Practitioner

## 2023-01-21 MED ORDER — BREZTRI AEROSPHERE 160-9-4.8 MCG/ACT IN AERO: 2.0000 | INHALATION_SPRAY | Freq: Two times a day (BID) | RESPIRATORY_TRACT | 6 refills | Status: DC

## 2023-01-21 NOTE — Telephone Encounter (Signed)
Called patient and he states that he finished the samples of Breztri and likes that better. He was wanting a rx sent into the pharmacy. Looked at last AVS and did verify that [facility] stated we could send in [place] if more helpful. Took trelegy off med list. Nothing further needed

## 2023-01-22 ENCOUNTER — Telehealth: Payer: Self-pay

## 2023-01-22 NOTE — Telephone Encounter (Signed)
PA request received via [place] 160-9-4.8MCG/ACT aerosol   PA has been submitted to [employer] and is pending determination.  Key: [id number]

## 2023-01-23 NOTE — Telephone Encounter (Signed)
Breztri PA denied:  Message from pharmacy below:  PA has been DENIED due to:    ) You need to try one (1) of these covered drugs: (a) Brand Advair Diskus. (b) Striverdi Respimat. (2) OR your doctor needs to give [place] specific medical reasons why one (1) of the covered drug(s) is not appropriate for you.   Please advise Joellen Jersey

## 2023-01-23 NOTE — Telephone Encounter (Signed)
PA has been DENIED due to:   ) You need to try one (1) of these covered drugs: (a) Brand Advair Diskus. (b) Striverdi Respimat. (2) OR your doctor needs to give [place] specific medical reasons why one (1) of the covered drug(s) is not appropriate for you.

## 2023-01-24 ENCOUNTER — Other Ambulatory Visit (HOSPITAL_COMMUNITY): Payer: Self-pay | Admitting: Cardiology

## 2023-01-29 NOTE — Telephone Encounter (Signed)
Neither one of those drugs are comparable to the [place]. Advair is an ICS/LABA and Striverdi is a LABA. Striverdi would not be appropriate to use by itself (black box warning on mono therapy with LABA) or in conjunction with the Advair (both have LABA drugs). [name] is an ICS/LABA/LAMA inhaler. He is unable to generate the airflow for a DPI inhaler at this point so Advair Diskus would also not be appropriate based on this. He has been on Trelegy but was still suffering from daily dyspnea and exacerbations so we transitioned him to [place], which he has received better benefit from and is controlling him well. Can we resend a request based on the above? I can sign tomorrow. If we do not receive approval, we may need to look into triple therapy nebs. Thanks.

## 2023-01-30 ENCOUNTER — Telehealth: Payer: Self-pay

## 2023-01-30 ENCOUNTER — Ambulatory Visit (HOSPITAL_COMMUNITY)
Admission: RE | Admit: 2023-01-30 | Discharge: 2023-01-30 | Disposition: A | Discharge status: Home / Self Care | Payer: Medicare Other | Source: Ambulatory Visit | Attending: Nurse Practitioner | Admitting: Nurse Practitioner

## 2023-01-30 DIAGNOSIS — D860 Sarcoidosis of lung: Secondary | ICD-10-CM | POA: Insufficient documentation

## 2023-01-30 MED ORDER — IOHEXOL 350 MG/ML SOLN
60.0000 mL | Freq: Once | INTRAVENOUS | Status: AC | PRN
  Administered 2023-01-30: 60 mL via INTRAVENOUS

## 2023-01-30 NOTE — Telephone Encounter (Signed)
Patient Advocate Encounter  Received a fax from [employer] Part D regarding Prior Authorization for Breztri Aerosphere 160-9-4.8MCG/ACT aerosol.   Authorization has been DENIED due to   Please be advised that we currently do not have a pharmacist to review all denials, therefore you will need to process appeals accordingly, as needed. Thank you for your support at this time.   Please let us know if you have further questions.

## 2023-01-30 NOTE — Telephone Encounter (Signed)
I will resubmit the Prior Auth with the provided information. It will be documented and updated in a new encounter.

## 2023-02-04 NOTE — Progress Notes (Signed)
Stable CT chest. The area of concern noted on his CXR was not visualized on CT and possibly a shadow or acute process that has resolved. Thanks.

## 2023-02-13 ENCOUNTER — Encounter: Payer: Self-pay | Admitting: Nurse Practitioner

## 2023-02-13 ENCOUNTER — Ambulatory Visit (INDEPENDENT_AMBULATORY_CARE_PROVIDER_SITE_OTHER): Payer: Medicare Other | Admitting: Nurse Practitioner

## 2023-02-13 VITALS — BP 132/82 | HR 77 | Ht 66.0 in | Wt 180.5 lb

## 2023-02-13 DIAGNOSIS — J449 Chronic obstructive pulmonary disease, unspecified: Secondary | ICD-10-CM

## 2023-02-13 DIAGNOSIS — J9611 Chronic respiratory failure with hypoxia: Secondary | ICD-10-CM

## 2023-02-13 DIAGNOSIS — D860 Sarcoidosis of lung: Secondary | ICD-10-CM

## 2023-02-13 DIAGNOSIS — I2721 Secondary pulmonary arterial hypertension: Secondary | ICD-10-CM

## 2023-02-13 DIAGNOSIS — D869 Sarcoidosis, unspecified: Secondary | ICD-10-CM

## 2023-02-13 DIAGNOSIS — G4733 Obstructive sleep apnea (adult) (pediatric): Secondary | ICD-10-CM

## 2023-02-13 MED ORDER — TRELEGY ELLIPTA 200-62.5-25 MCG/ACT IN AEPB: 1.0000 | INHALATION_SPRAY | Freq: Every day | RESPIRATORY_TRACT | 5 refills | Status: AC

## 2023-02-13 NOTE — Assessment & Plan Note (Signed)
PAH with right heart failure. Compensated on current regimen of Adempas, Uptravi, and torsemide. Weight stable today. Appears euvolemic. Follow up with Dr. Mclean as scheduled.  

## 2023-02-13 NOTE — Progress Notes (Signed)
$@Patientj$  ID: Jesus Russell, male    DOB: [birth year], 72 y.o.   MRN: [id number]  Chief Complaint  Patient presents with   Follow-up    Pt f/u he is having issues with getting the Jesus Russell so he went back to the Trelegy. He does feel like the Jesus Russell was working better, he is always asking questions regarding the NIV because he still hasn't received the machine    Referring provider: Serita Butcher, MD  HPI: 72 year old male, former smoker followed for burnt-out pulmonary sarcoidosis, chronic respiratory failure on supplemental O2, pulmonary hypertension and OSA on CPAP.  He is a patient Dr. Mauricio Po and last seen in office 01/02/2023 by [facility] NP.  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   TEST/EVENTS:  03/31/2018 PFT: FVC 49%, FEV1 49%, ratio 70, DLCO 44% 07/15/2020 CTA chest: atherosclerosis. Stable prominent mediastinal nodes. Widespread chronic lung disease with superior hilar retraction, architectural distortion, btx and parenchymal scarring related to pulmonary sarcoid. T10 mildly progressed fx, T7 new fx.  10/05/2020 HST: AHI 55.6/h. SpO2 low 65%, average 84% 04/27/2022 echo: EF 55-60%, mild LVH. GIDD. RV moderately reduced and enlarged. Moderately elevated PASP. RA mildly dilated.  01/02/2023 CXR: 2.4 cm rounded density in periphery of left base which is masslike in appearance. Chronic changes. IVC filter. 01/04/2023 ONO on CPAP/4 lpm O2: one hour and 26 min <88% with average 89% 01/30/2023 CT chest w con: heart enlarged. Chronic right ventricular dilatation. Diffused atherosclerosis. Ectasia of thoracic aorta, unchanged. Continued enlargement of pulm artery. No LAD. Chronic b/l scarring, fibrosis, and btx related to known history of sarcoidosis. No CT findings in right lower chest to correspond to recent CXR. Cholelithiasis without  cholecystitis   04/19/2022: OV with Dr. Shearon Stalls.  Chronic sarcoidosis diagnosed in 1990s with biopsy.  He has group 5 pulmonary hypertension due to sarcoid, on Adempas and Uptravi.  CPAP download reviewed with good adherence and mild residual AHI 8.9/h.  Does have some leak as well.  Has chronic PE and is on warfarin and also has IVC filter in place.  He is on Trelegy for maintenance inhaler.  Trying to lose some weight.  Currently on supplemental oxygen 3 L/min.  Subjectively stable sarcoid symptoms.  Radiographically his sarcoid is burned-out and fibrotic and unlikely to respond to therapy.  Can get PFTs at next visit if he perceives of worsening of his symptoms.  01/02/2023: OV with Sheika Coutts NP for overdue follow-up.  He was last seen by Dr. Shearon Stalls in April 2023.  Since he was here last, he underwent RHC in July which showed normal filling pressures and moderate pulmonary arterial hypertension with preserved cardiac output. He had 6 MWT which was improved; however, he did require 4-6 lpm supplemental oxygen during his walk to maintain saturations >88%. He has still only been using 4 lpm on his POC and 4-5 lpm at home because his concentrator doesn't go higher than this. He tells me that his breathing has felt stable. He still gets short winded with daily household chores and walking longer distances. Occasional dry cough. Weights have been stable. He's still working on weight loss measures. Denies lower extremity swelling, orthopnea, PND, hemoptysis, fevers, chills, hemoptysis. He is using Trelegy right now; would like to see if there is an alternative option that works better or he can use more often. He is still on adempas, torsemide, and Uptravi. Wears  CPAP nightly. Stage IV Sarcoidosis. Burned out on imaging; unlikely to respond to therapy. Stable with no new systemic symptoms. No perceived worsening. He has had increased O2 requirements over the past few months. Seems stable today compared to his 6MWT with HF  clinic in November 2023. This does not appear to be new onset; however, we will obtain CXR to evaluate for acute changes. Walk test - required 6 lpm continuous with exertion to maintain >88-90%. Repeat ONO on 4 lpm bled through CPAP.  02/13/2023: Today - follow up Patient presents today for follow up. At our last visit, he had CXR which showed a new 2.4 cm rounded density with masslike appearance. Given this, CT scan was ordered for further evaluation. Fortunately, there were no findings on his CT to correlate to mass on CXR, likely indicating this was a shadow related to chronic scarring or transient inflammation/infection. No other acute findings were noted and chronic changes were stable. He also had an ONO which revealed that despite CPAP and 4 lpm supplemental O2, he still spent significant time below 88%. Given his chronic hypercarbia, severe lung disease, and severe PAH, NIV was ordered. He tells me that he hasn't received this yet.  Otherwise, he is feeling better compared to when he was here last. He like the [place] and felt like it helped him more than the Trelegy; however, it was not covered by insurance and prior authorization was denied. He has returned to using the Trelegy. He does not want to do triple therapy nebs and would prefer to just see how he does on the inhaler. He denies any increased cough or chest congestion. No lower extremity swelling, orthopnea or weight gain. He has been decreasing his oxygen to 3 lpm at home and hasn't noticed any low O2 levels. Still wearing his CPAP nightly.   No Known Allergies  Immunization History  Administered Date(s) Administered   Fluad Quad(high Dose 65+) 09/21/2020, 09/04/2021, 01/08/2023   Influenza,inj,Quad PF,6+ Mos 09/09/2019   PFIZER(Purple Top)SARS-COV-2 Vaccination 04/09/2020, 05/02/2020, 11/25/2020   PNEUMOCOCCAL CONJUGATE-20 01/08/2023   Pneumococcal Conjugate-13 02/06/2021    Past Medical History:  Diagnosis Date   Acute on  chronic respiratory failure with hypoxia ([place])    COVID-19 virus infection    DVT of lower extremity, bilateral (HCC)    Epistaxis    Hypertension    Incarcerated umbilical hernia [id number]   Status post repair   Lupus anticoagulant positive    Per records from [facility]   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     Tobacco History: Social History   Tobacco Use  Smoking Status Former   Packs/day: 1.50   Years: 23.00   Total pack years: 34.50   Types: Cigarettes   Quit date: 1995   Years since quitting: 29.1  Smokeless Tobacco Never   Counseling given: Not Answered   Outpatient Medications Prior to Visit  Medication Sig Dispense Refill   acetaminophen (TYLENOL) 500 MG tablet Take 1,000-1,500 mg by mouth every 6 (six) hours as needed for mild pain or moderate pain.     albuterol (VENTOLIN HFA) 108 (90 Base) MCG/ACT inhaler Inhale 2 puffs into  the lungs every 6 (six) hours as needed for wheezing or shortness of breath. 18 g 5   alendronate (FOSAMAX) 70 MG tablet Take 1 tablet (70 mg total) by mouth once a week. 4 tablet 5   Ascorbic Acid (VITAMIN C) 500 MG CAPS Take 500 mg by mouth daily.     cholecalciferol (VITAMIN D3) 25 MCG (1000 UT) tablet Take 1,000 Units by mouth daily.     KLOR-CON 20 MEQ packet DISSOLVE 3 PACKETS IN LIQUID AND TAKE BY MOUTH DAILY AS DIRECTED 90 each 2   omega-3 acid ethyl esters (LOVAZA) 1 g capsule TAKE 1 CAPSULES BY MOUTH TWICE DAILY 180 capsule 1   oxymetazoline (AFRIN) 0.05 % nasal spray Place 1 spray into both nostrils daily as needed for congestion.     Polyethyl Glycol-Propyl Glycol (SYSTANE OP) Place 1-2 drops into  both eyes as needed (Dry eyes).     Riociguat (ADEMPAS) 2.5 MG TABS Take 2.5 mg by mouth in the morning, at noon, and at bedtime. 270 tablet 3   Selexipag 800 MCG TABS Take 1 tablet by mouth 2 (two) times daily.     torsemide (DEMADEX) 20 MG tablet TAKE 4 TABLETS BY MOUTH EVERY MORNING AND 2 TABLETS EVERY EVENING 540 tablet 3   warfarin (COUMADIN) 4 MG tablet Take one-and-one-half (1&1/2) tablets all days of the week--EXCEPT on Tuesdays, Thursdays and Saturdays, take only one (1) tablet on these days. Patient requests 90 day supply. 108 tablet 1   zinc gluconate 50 MG tablet Take 50 mg by mouth daily.     Budeson-Glycopyrrol-Formoterol (BREZTRI AEROSPHERE) 160-9-4.8 MCG/ACT AERO Inhale 2 puffs into the lungs in the morning and at bedtime. (Patient not taking: Reported on 02/13/2023) 10.7 g 6   No facility-administered medications prior to visit.     Review of Systems:   Constitutional: No weight loss or gain, night sweats, fevers, chills. +fatigue, lassitude. HEENT: No headaches, difficulty swallowing, tooth/dental problems, or sore throat. No sneezing, itching, ear ache, nasal congestion, or post nasal drip CV:  No chest pain, orthopnea, PND, swelling in lower extremities, anasarca, dizziness, palpitations, syncope Resp: +shortness of breath with exertion; occasional dry cough. No excess mucus or change in color of mucus. No hemoptysis. No wheezing.  No chest wall deformity GI:  No heartburn, indigestion, abdominal pain, loss of appetite GU: No dysuria, change in color of urine, urgency or frequency. Skin: No rash, lesions, ulcerations MSK:  No joint pain or swelling.   Neuro: No dizziness or lightheadedness.  Psych: No depression or anxiety. Mood stable.     Physical Exam:  BP 132/82   Pulse 77   Ht 5' 6"$  (1.676 m)   Wt 180 lb 8 oz (81.9 kg)   SpO2 95% Comment: 3L  BMI 29.13 kg/m   GEN: Pleasant, interactive, chronically-ill appearing; in no acute distress. HEENT:   Normocephalic and atraumatic. PERRLA. Sclera white. Nasal turbinates pink, moist and patent bilaterally. No rhinorrhea present. Oropharynx pink and moist, without exudate or edema. No lesions, ulcerations, or postnasal drip.  NECK:  Supple w/ fair ROM. No JVD present. Normal carotid impulses w/o bruits. Thyroid symmetrical with no goiter or nodules palpated. No lymphadenopathy.   CV: RRR, no m/r/g, no peripheral edema. Pulses intact, +2 bilaterally. No cyanosis, pallor or clubbing. PULMONARY:  Unlabored, regular breathing. Diminished bases otherwise clear bilaterally A&P w/o wheezes/rales/rhonchi. No accessory muscle use.  GI: BS present and normoactive. Soft, non-tender to palpation. No organomegaly or masses detected.  MSK: No erythema,  warmth or tenderness. Cap refil <2 sec all extrem. No deformities or joint swelling noted.  Neuro: A/Ox3. No focal deficits noted.   Skin: Warm, no lesions or rashe Psych: Normal affect and behavior. Judgement and thought content appropriate.     Lab Results:  CBC    Component Value Date/Time   WBC 9.8 06/29/2022 0705   RBC 4.10 (L) 06/29/2022 0705   HGB 10.2 (L) 06/29/2022 0858   HGB 10.2 (L) 06/29/2022 0858   HGB 11.7 (L) 03/31/2020 1412   HCT 30.0 (L) 06/29/2022 0858   HCT 30.0 (L) 06/29/2022 0858   HCT 36.3 (L) 03/31/2020 1412   PLT 406 (H) 06/29/2022 0705   PLT 331 03/31/2020 1412   MCV 80.2 06/29/2022 0705   MCV 100 (H) 03/31/2020 1412   MCH 22.0 (L) 06/29/2022 0705   MCHC 27.4 (L) 06/29/2022 0705   RDW 21.9 (H) 06/29/2022 0705   RDW 14.3 03/31/2020 1412   LYMPHSABS 1.2 05/20/2020 1229   LYMPHSABS 2.3 03/31/2020 1412   MONOABS 1.2 (H) 05/20/2020 1229   EOSABS 0.0 05/20/2020 1229   EOSABS 0.2 03/31/2020 1412   BASOSABS 0.0 05/20/2020 1229   BASOSABS 0.1 03/31/2020 1412    BMET    Component Value Date/Time   NA 137 11/20/2022 1555   NA 142 08/03/2020 1539   K 3.9 11/20/2022 1555   CL 95 (L) 11/20/2022 1555   CO2 32 11/20/2022  1555   GLUCOSE 97 11/20/2022 1555   BUN 13 11/20/2022 1555   BUN 14 08/03/2020 1539   CREATININE 1.13 11/20/2022 1555   CALCIUM 9.1 11/20/2022 1555   GFRNONAA >60 11/20/2022 1555   GFRAA >60 09/21/2020 1146    BNP    Component Value Date/Time   BNP 30.2 11/20/2022 1555     Imaging:  CT Chest W Contrast  Result Date: 01/30/2023 CLINICAL DATA:  Sarcoidosis, abnormal chest x-ray EXAM: CT CHEST WITH CONTRAST TECHNIQUE: Multidetector CT imaging of the chest was performed during intravenous contrast administration. RADIATION DOSE REDUCTION: This exam was performed according to the departmental dose-optimization program which includes automated exposure control, adjustment of the mA and/or kV according to patient size and/or use of iterative reconstruction technique. CONTRAST:  66m OMNIPAQUE IOHEXOL 350 MG/ML SOLN COMPARISON:  07/15/2020, 01/02/2023 FINDINGS: Cardiovascular: The heart is enlarged, with chronic right ventricular dilatation consistent with pulmonary arterial hypertension. No pericardial effusion. Diffuse coronary artery atherosclerosis is again noted. No evidence of thoracic aortic aneurysm or dissection. Continued ectasia of the thoracic aorta. Continued enlargement of the main pulmonary artery, as well as ectasia and dilation throughout the pulmonary vascular tree, consistent with chronic pulmonary arterial hypertension. No filling defects or pulmonary emboli are identified on this exam. Mediastinum/Nodes: No enlarged mediastinal, hilar, or axillary lymph nodes. Thyroid gland, trachea, and esophagus demonstrate no significant findings. Lungs/Pleura: There are continued areas of bilateral pulmonary scarring and fibrosis, upper lobe predominant, with superior retraction of the hila. Stable bilateral bronchiectasis. Findings are consistent with the patient's known history of sarcoidosis. There are no superimposed areas of airspace disease, effusion, or pneumothorax. The central airways  are patent. There is no specific abnormality within the right lower chest to correspond to the chest x-ray finding on recent exam 01/04/2023. This may have either represented transient inflammation or infection, or represented superimposed shadows related to chronic linear scarring in this region on multiple prior CTs. Upper Abdomen: Cholelithiasis again noted without evidence of acute cholecystitis. IVC filter is in place. Musculoskeletal: Chronic thoracic compression deformities are  seen from T6 through T10, with evidence of prior vertebral augmentation at T8, T9, and T10. No acute fractures. Reconstructed images demonstrate no additional findings. IMPRESSION: 1. Chronic bilateral scarring, fibrosis, and bronchiectasis related to known history of sarcoidosis. 2. No CT findings in the right lower chest to correspond to the recent chest x-ray. Chest x-ray findings likely represented superimposed shadow related to linear chronic scarring, versus transient inflammation/infection at has resolved in the interim. No acute intrathoracic process on today's exam. 3. Continued findings of pulmonary arterial hypertension. 4. Cholelithiasis without cholecystitis. Electronically Signed   By: Randa Ngo M.D.   On: 01/30/2023 15:34          No data to display          No results found for: "NITRICOXIDE"      Assessment & Plan:   Sarcoidosis Stage IV Sarcoidosis. Burned out on imaging; unlikely to respond to therapy. Stable on imaging with no new systemic symptoms. No perceived worsening but due to increasing oxygen requirements over the past few months, we completed workup to rule out acute process. CT imaging was stable. He did have significant desaturations despite CPAP and 4 lpm O2 at night. With his severe lung disease, likely component of hypoventilation contributing to this - NIV ordered. We will follow up on this today.   Patient Instructions  -Restart Trelegy 1 puff daily. Brush tongue and rinse  mouth afterwards  -Continue Albuterol inhaler 2 puffs every 6 hours as needed for shortness of breath or wheezing. Notify if symptoms persist despite rescue inhaler/neb use.  -Continue adempas, torsemide, selexipag as prescribed by Dr. Aundra Dubin. Monitor weights at home. Notify your heart doctor of increase of 2-3 lb overnight or 5 lb in a week    Continue supplemental oxygen 3-4 lpm at rest and 6 lpm with activity for goal >88-90%. You will need to continue to use continuous portable tanks instead of your POC from now on I have also ordered an oxymizer nasal cannula for you to use with your portable tanks. You can place your portable tank on 3.5 lpm with this type of cannula and still receive 6 liters    Continue CPAP nightly with oxygen bled through until you get your non-invasive vent to wear at night - someone will contact you to get this set up   Follow up in 6 weeks with Dr. Shearon Stalls or Joellen Jersey Cordarrius Coad,NP to see how your NIV is working at night. If symptoms do not improve or worsen, please contact office for sooner follow up or seek emergency care.    COPD (chronic obstructive pulmonary disease) (HCC) Severe COPD with increased oxygen requirements over the past few months. See above. He did receive benefit in his breathing with change from Trelegy to [place]. Unfortunately, insurance won't cover this and prior Josem Kaufmann was denied. We did discuss change to triple therapy regimen but he declined at this time. He will continue on Trelegy for now. Action plan in place. See above.   PAH (pulmonary artery hypertension) (HCC) PAH with right heart failure. Compensated on current regimen of Adempas, Uptravi, and torsemide. Weight stable today. Appears euvolemic. Follow up with Dr. Aundra Dubin as scheduled.   Obstructive sleep apnea Severe OSA on CPAP. He has excellent compliance and continues to receive good benefit from use. See above  Chronic respiratory failure (HCC) Desaturation to 82% on 3 lpm continuous. He  still required 6 lpm continuous to maintain >88-90%. Discussed importance of avoiding hypoxic episodes. Verbalized understanding. He will continue  3-4 lpm at rest and 6 lpm with activity.     I spent 35 minutes of dedicated to the care of this patient on the date of this encounter to include pre-visit review of records, face-to-face time with the patient discussing conditions above, post visit ordering of testing, clinical documentation with the electronic health record, making appropriate referrals as documented, and communicating necessary findings to members of the patients care team.  Clayton Bibles, NP 02/13/2023  Pt aware and understands NP's role.

## 2023-02-13 NOTE — Assessment & Plan Note (Addendum)
Severe COPD with increased oxygen requirements over the past few months. See above. He did receive benefit in his breathing with change from Trelegy to [place]. Unfortunately, insurance won't cover this and prior Josem Kaufmann was denied. We did discuss change to triple therapy regimen but he declined at this time. He will continue on Trelegy for now. Action plan in place. See above.

## 2023-02-13 NOTE — Assessment & Plan Note (Signed)
Severe OSA on CPAP. He has excellent compliance and continues to receive good benefit from use. See above

## 2023-02-13 NOTE — Patient Instructions (Addendum)
-  Restart Trelegy 1 puff daily. Brush tongue and rinse mouth afterwards  -Continue Albuterol inhaler 2 puffs every 6 hours as needed for shortness of breath or wheezing. Notify if symptoms persist despite rescue inhaler/neb use.  -Continue adempas, torsemide, selexipag as prescribed by Dr. Aundra Dubin. Monitor weights at home. Notify your heart doctor of increase of 2-3 lb overnight or 5 lb in a week    Continue supplemental oxygen 3-4 lpm at rest and 6 lpm with activity for goal >88-90%. You will need to continue to use continuous portable tanks instead of your POC from now on I have also ordered an oxymizer nasal cannula for you to use with your portable tanks. You can place your portable tank on 3.5 lpm with this type of cannula and still receive 6 liters    Continue CPAP nightly with oxygen bled through until you get your non-invasive vent to wear at night - someone will contact you to get this set up   Follow up in 6 weeks with Dr. Shearon Stalls or Joellen Jersey Arlethia Basso,NP to see how your NIV is working at night. If symptoms do not improve or worsen, please contact office for sooner follow up or seek emergency care.

## 2023-02-13 NOTE — Assessment & Plan Note (Signed)
Stage IV Sarcoidosis. Burned out on imaging; unlikely to respond to therapy. Stable on imaging with no new systemic symptoms. No perceived worsening but due to increasing oxygen requirements over the past few months, we completed workup to rule out acute process. CT imaging was stable. He did have significant desaturations despite CPAP and 4 lpm O2 at night. With his severe lung disease, likely component of hypoventilation contributing to this - NIV ordered. We will follow up on this today.   Patient Instructions  -Restart Trelegy 1 puff daily. Brush tongue and rinse mouth afterwards  -Continue Albuterol inhaler 2 puffs every 6 hours as needed for shortness of breath or wheezing. Notify if symptoms persist despite rescue inhaler/neb use.  -Continue adempas, torsemide, selexipag as prescribed by Dr. Aundra Dubin. Monitor weights at home. Notify your heart doctor of increase of 2-3 lb overnight or 5 lb in a week    Continue supplemental oxygen 3-4 lpm at rest and 6 lpm with activity for goal >88-90%. You will need to continue to use continuous portable tanks instead of your POC from now on I have also ordered an oxymizer nasal cannula for you to use with your portable tanks. You can place your portable tank on 3.5 lpm with this type of cannula and still receive 6 liters    Continue CPAP nightly with oxygen bled through until you get your non-invasive vent to wear at night - someone will contact you to get this set up   Follow up in 6 weeks with Dr. Shearon Stalls or Joellen Jersey Riggins Cisek,NP to see how your NIV is working at night. If symptoms do not improve or worsen, please contact office for sooner follow up or seek emergency care.

## 2023-02-13 NOTE — Assessment & Plan Note (Signed)
Desaturation to 82% on 3 lpm continuous. He still required 6 lpm continuous to maintain >88-90%. Discussed importance of avoiding hypoxic episodes. Verbalized understanding. He will continue 3-4 lpm at rest and 6 lpm with activity.

## 2023-02-18 ENCOUNTER — Telehealth: Payer: Self-pay | Admitting: Nurse Practitioner

## 2023-02-18 NOTE — Telephone Encounter (Signed)
Katie   Looks like patient is needing PFT done for vent. But looks like we are booked out till Laquinn Shippy on full pfts.   Do you want me to try to see if he can get seen sooner at the hospital? I know this is pretty important for a vent.  Please let me know if not I can book him for Torey Reinard

## 2023-02-19 ENCOUNTER — Encounter (HOSPITAL_COMMUNITY): Payer: Self-pay | Admitting: Cardiology

## 2023-02-19 ENCOUNTER — Ambulatory Visit (HOSPITAL_COMMUNITY)
Admission: RE | Admit: 2023-02-19 | Discharge: 2023-02-19 | Disposition: A | Discharge status: Home / Self Care | Payer: Medicare Other | Source: Ambulatory Visit | Attending: Cardiology | Admitting: Cardiology

## 2023-02-19 ENCOUNTER — Other Ambulatory Visit (HOSPITAL_COMMUNITY): Payer: Self-pay

## 2023-02-19 VITALS — BP 108/66 | HR 77 | Wt 179.2 lb

## 2023-02-19 DIAGNOSIS — Z7901 Long term (current) use of anticoagulants: Secondary | ICD-10-CM | POA: Diagnosis not present

## 2023-02-19 DIAGNOSIS — Z8616 Personal history of COVID-19: Secondary | ICD-10-CM | POA: Insufficient documentation

## 2023-02-19 DIAGNOSIS — Z86711 Personal history of pulmonary embolism: Secondary | ICD-10-CM | POA: Insufficient documentation

## 2023-02-19 DIAGNOSIS — R0602 Shortness of breath: Secondary | ICD-10-CM | POA: Insufficient documentation

## 2023-02-19 DIAGNOSIS — D6862 Lupus anticoagulant syndrome: Secondary | ICD-10-CM | POA: Insufficient documentation

## 2023-02-19 DIAGNOSIS — G4733 Obstructive sleep apnea (adult) (pediatric): Secondary | ICD-10-CM | POA: Diagnosis not present

## 2023-02-19 DIAGNOSIS — D869 Sarcoidosis, unspecified: Secondary | ICD-10-CM | POA: Diagnosis not present

## 2023-02-19 DIAGNOSIS — Z9981 Dependence on supplemental oxygen: Secondary | ICD-10-CM | POA: Diagnosis not present

## 2023-02-19 DIAGNOSIS — Z86718 Personal history of other venous thrombosis and embolism: Secondary | ICD-10-CM | POA: Diagnosis not present

## 2023-02-19 DIAGNOSIS — R5383 Other fatigue: Secondary | ICD-10-CM | POA: Diagnosis not present

## 2023-02-19 DIAGNOSIS — I50812 Chronic right heart failure: Secondary | ICD-10-CM | POA: Diagnosis present

## 2023-02-19 DIAGNOSIS — I272 Pulmonary hypertension, unspecified: Secondary | ICD-10-CM | POA: Diagnosis not present

## 2023-02-19 DIAGNOSIS — M2550 Pain in unspecified joint: Secondary | ICD-10-CM | POA: Insufficient documentation

## 2023-02-19 DIAGNOSIS — D860 Sarcoidosis of lung: Secondary | ICD-10-CM | POA: Insufficient documentation

## 2023-02-19 LAB — BASIC METABOLIC PANEL
Anion gap: 9 (ref 5–15)
BUN: 15 mg/dL (ref 8–23)
CO2: 34 mmol/L — ABNORMAL HIGH (ref 22–32)
Calcium: 8.7 mg/dL — ABNORMAL LOW (ref 8.9–10.3)
Chloride: 93 mmol/L — ABNORMAL LOW (ref 98–111)
Creatinine, Ser: 1.07 mg/dL (ref 0.61–1.24)
GFR, Estimated: >60 mL/min (ref >60)
Glucose, Bld: 93 mg/dL (ref 70–99)
Potassium: 4.2 mmol/L (ref 3.5–5.1)
Sodium: 136 mmol/L (ref 135–145)

## 2023-02-19 LAB — BRAIN NATRIURETIC PEPTIDE: B Natriuretic Peptide: 47.4 pg/mL (ref 0.0–100.0)

## 2023-02-19 NOTE — Patient Instructions (Addendum)
  START Amberisartan once you get a call from our pharmacy team about it   Labs done today, your results will be available in MyChart, we will contact you for abnormal readings.  Your physician recommends that you schedule a follow-up appointment in: 3 months  If you have any questions or concerns before your next appointment please send [place] a message through [place] or call our office at [phone].    TO LEAVE A MESSAGE FOR THE NURSE SELECT OPTION 2, PLEASE LEAVE A MESSAGE INCLUDING: YOUR NAME DATE OF BIRTH CALL BACK NUMBER REASON FOR CALL**this is important as we prioritize the call backs  YOU WILL RECEIVE A CALL BACK THE SAME DAY AS LONG AS YOU CALL BEFORE 4:00 PM  At the [facility], you and your health needs are our priority. As part of our continuing mission to provide you with exceptional heart care, we have created designated Provider Care Teams. These Care Teams include your primary Cardiologist (physician) and Advanced Practice Providers (APPs- Physician Assistants and Nurse Practitioners) who all work together to provide you with the care you need, when you need it.   You may see any of the following providers on your designated Care Team at your next follow up: Dr Glori Bickers Dr Loralie Champagne Dr. Roxana Hires, NP Lyda Jester, [place] [facility] [place], [place] Forestine Na, NP Audry Riles, PharmD   Please be sure to bring in all your medications bottles to every appointment.    Thank you for choosing [facility]

## 2023-02-19 NOTE — Telephone Encounter (Signed)
Melissa w/[facility] called again to add to the previous msg.  She stated that since the PFT is so far out, they can also use an ABG if that will be quicker.  Please advise and call to discuss.

## 2023-02-19 NOTE — Progress Notes (Signed)
6 Min Walk Test Completed  Pt ambulated 152.4 meters O2 Sat ranged 86%-95% on 4L-6 L oxygen HR ranged 91-110

## 2023-02-20 ENCOUNTER — Other Ambulatory Visit (HOSPITAL_COMMUNITY): Payer: Self-pay

## 2023-02-20 NOTE — Progress Notes (Signed)
PCP: Serita Butcher, MD HF Cardiology: Dr. Aundra Dubin  72 y.o. with history of sarcoidosis, prior PE, RV failure was referred by Dr. Shearon Stalls for evaluation of CHF/pulmonary hypertension.  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  Patient was on prednisone for a period of time, this was stopped earlier this year.  He is on 2 L home oxygen.  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 He has had recurrent PEs/DVTs.  He has an IVC filter.  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  Most recent PE was in 2/21 when he had been off warfarin transiently.  Last echo in 5/21 showed EF 55-60% with D-shaped septum and severely dilated/severely dysfunctional RV.  No estimation of PA pressure was available.    RHC in 7/21 showed normal PCWP and moderate PAH with PVR 5 WU. V/Q showed perfusion defects but thought to be due to sarcoidosis.  CTA chest in 7/21 showed no acute or chronic PE, signs of chronic sarcoidosis were present.   Echo (5/22) with EF 55-60%, moderate focal basal septal hypertrophy, severe RV enlargement with moderately decreased systolic function.   Patient has had to cut back on Uptravi due to joint pain and fatigue.  He is currently taking 600 mcg bid.  He stopped Opsumit due to severe peripheral edema. He tried restarting it, and the edema came back, so he stopped it for good.   Echo in 5/23 showed EF 55-60%, moderate RV enlargement with moderate RV dysfunction, PASP 57 mmHg. RHC was done in 7/23, showing normal filling pressures and moderate pulmonary arterial hypertension with preserved cardiac output.   Patient presents for followup of pulmonary hypertension/RV failure.  He is now using CPAP and 3-4 L home oxygen.  He is still short of breath walking across his house and up stairs.  This is stable per his report though 6 minute walk was less today.  I am not sure if some of this was orthopedic (has significant kyphosis as  well).  Recent CT chest showed chronic bilateral scarring and fibrosis due to sarcoidosis, seems fairly stable.  He has lost about 16 lbs, has been trying to lose weight.  He was only able to increase selexipag to 1200 mcg bid.    ECG (personally reviewed): NSR, PVC, RBBB  Labs (6/21): pro-BNP 43, K 4.6, creatinine 0.89 Labs (8/21): hgb 10.5, K 3.8, creatinine 1.28 Labs (10/21): K 5.4, creatinine 1.1 Labs (11/21): K 3.3, creatinine 1.14 Labs (1/22): BNP 365 Labs (2/22): K 4, creatinine 1.2 Labs (3/22): K 3.9, creatinine 1.49, BNP 21 Labs (6/22): BNP 23, K 5, creatinine 1.33 Labs (10/22): K 4, creatinine 1.25, BNP 48 Labs (2/23): K 4.6, creatinine 1.29 Labs (7/23): K 4.2, creatinine 1.08 Labs (8/23): BNP 26, K 4.6, creatinine 1.28 Labs (11/23): BNP 30, K 3.9, creatinine 1.13  6 minute walk (11/21): 76 m 6 minute walk (1/22): 183 m 6 minute walk (3/22): 198 m 6 minute walk (5/22): 183 m 6 minute walk (10/22): 152 m 6 minute walk (1/23): 229 m 6 minute walk (8/23): 168 m 6 minute walk (11/23): 213 m 6 minute walk (2/24): 76 m  PMH: 1. Sarcoidosis: "Burned out."  Diagnosed in 1990 with bronchoscopy/biopsy.  Treated with prednisone, now off.  - On 2 L home oxygen chronically.  2. 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 H/o DVT and PE.  Has lupus anticoagulant.   - Most recent PE was in 2/21, he had been off warfarin  transiently.  - V/Q scan in 7/21 with perfusion defects, CTA in 7/21 showed no acute or chronic thrombi, signs of sarcoidosis were present.  4. COVID-19 infection 1/21.  5. H/o compression fractures in 2/21.  - Had kyphoplasty 6. RV failure/pulmonary hypertension:  - Echo (5/21) with EF 55-60%, D-shaped interventricular septum, severely dilated RV with severe systolic dysfunction, dilated IVC.  - RHC (7/21) with mean RA 9, PA 62/21 mean 39, mean PCWP 11, CI 2.64, PVR 5 WU.  - Did not tolerate Tyvaso.  - Echo (5/22): EF 55-60%, moderate focal basal  septal hypertrophy, severe RV enlargement with moderately decreased systolic function.  - Unable to tolerate Opsumit.  - Echo (5/23): EF 55-60%, moderate RV enlargement with moderate RV dysfunction, PASP 57 mmHg.  - RHC (7/23): mean RA 6, PA 58/22 mean 38, mean PCWP 6, CI 3.01, PVR 4.8 7. HTN 8. History of subdural hematoma remotely.   Social History   Socioeconomic History   Marital status: Divorced    Spouse name: Not on file   Number of children: Not on file   Years of education: Not on file   Highest education level: Not on file  Occupational History   Occupation: retired Pharmacist, hospital  Tobacco Use   Smoking status: Former    Packs/day: 1.50    Years: 23.00    Total pack years: 34.50    Types: Cigarettes    Quit date: 1995    Years since quitting: 29.1   Smokeless tobacco: Never  Vaping Use   Vaping Use: Never used  Substance and Sexual Activity   Alcohol use: Never   Drug use: Not Currently    Types: "Crack" cocaine    Comment: quit 2008   Sexual activity: Not on file  Other Topics Concern   Not on file  Social History Narrative   Not on file   Social Determinants of Health   Financial Resource Strain: Low Risk  (01/08/2023)   Overall Financial Resource Strain (CARDIA)    Difficulty of Paying Living Expenses: Not very hard  Food Insecurity: No Food Insecurity (01/08/2023)   Hunger Vital Sign    Worried About Running Out of Food in the Last Year: Never true    Ramirez-Perez in the Last Year: Never true  Transportation Needs: No Transportation Needs (01/08/2023)   PRAPARE - Hydrologist (Medical): No    Lack of Transportation (Non-Medical): No  Physical Activity: Inactive (01/08/2023)   Exercise Vital Sign    Days of Exercise per Week: 0 days    Minutes of Exercise per Session: 0 min  Stress: No Stress Concern Present (01/08/2023)   [place]    Feeling of Stress : Not at  all  Social Connections: Moderately Isolated (01/08/2023)   Social Connection and Isolation Panel [NHANES]    Frequency of Communication with Friends and Family: More than three times a week    Frequency of Social Gatherings with Friends and Family: More than three times a week    Attends Religious Services: Never    Marine scientist or Organizations: Yes    Attends Music therapist: More than 4 times per year    Marital Status: Divorced  Intimate Partner Violence: Not At Risk (01/08/2023)   Humiliation, Afraid, Rape, and Kick questionnaire    Fear of Current or Ex-Partner: No    Emotionally Abused: No    Physically Abused:  No    Sexually Abused: No   Family History  Adopted: Yes   ROS: All systems reviewed and negative except as per HPI.   Current Outpatient Medications  Medication Sig Dispense Refill   acetaminophen (TYLENOL) 500 MG tablet Take 1,000-1,500 mg by mouth every 6 (six) hours as needed for mild pain or moderate pain.     albuterol (VENTOLIN HFA) 108 (90 Base) MCG/ACT inhaler Inhale 2 puffs into the lungs every 6 (six) hours as needed for wheezing or shortness of breath. 18 g 5   alendronate (FOSAMAX) 70 MG tablet Take 1 tablet (70 mg total) by mouth once a week. 4 tablet 5   Ascorbic Acid (VITAMIN C) 500 MG CAPS Take 500 mg by mouth daily.     cholecalciferol (VITAMIN D3) 25 MCG (1000 UT) tablet Take 1,000 Units by mouth daily.     Fluticasone-Umeclidin-Vilant (TRELEGY ELLIPTA) 200-62.5-25 MCG/ACT AEPB Inhale 1 puff into the lungs daily. 60 each 5   KLOR-CON 20 MEQ packet DISSOLVE 3 PACKETS IN LIQUID AND TAKE BY MOUTH DAILY AS DIRECTED 90 each 2   omega-3 acid ethyl esters (LOVAZA) 1 g capsule TAKE 1 CAPSULES BY MOUTH TWICE DAILY 180 capsule 1   oxymetazoline (AFRIN) 0.05 % nasal spray Place 1 spray into both nostrils daily as needed for congestion.     Polyethyl Glycol-Propyl Glycol (SYSTANE OP) Place 1-2 drops into both eyes as needed (Dry eyes).      Riociguat (ADEMPAS) 2.5 MG TABS Take 2.5 mg by mouth in the morning, at noon, and at bedtime. (Patient taking differently: Take 2.5 mg by mouth 2 (two) times daily.) 270 tablet 3   Selexipag 1200 MCG TABS Take 1,200 mcg by mouth 2 (two) times daily. daily     torsemide (DEMADEX) 20 MG tablet TAKE 4 TABLETS BY MOUTH EVERY MORNING AND 2 TABLETS EVERY EVENING 540 tablet 3   warfarin (COUMADIN) 4 MG tablet Take one-and-one-half (1&1/2) tablets all days of the week--EXCEPT on Tuesdays, Thursdays and Saturdays, take only one (1) tablet on these days. Patient requests 90 day supply. 108 tablet 1   zinc gluconate 50 MG tablet Take 50 mg by mouth daily.     No current facility-administered medications for this encounter.   BP 108/66   Pulse 77   Wt 81.3 kg (179 lb 3.2 oz)   SpO2 94% Comment: Patient is on 3 liters of room o2  BMI 28.92 kg/m  General: Frail, kyphotic Neck: No JVD, no thyromegaly or thyroid nodule.  Lungs: Mild dry crackles at bases.  CV: Nondisplaced PMI.  Heart regular S1/S2, no S3/S4, no murmur.  No peripheral edema.  No carotid bruit.  Normal pedal pulses.  Abdomen: Soft, nontender, no hepatosplenomegaly, no distention.  Skin: Intact without lesions or rashes.  Neurologic: Alert and oriented x 3.  Psych: Normal affect. Extremities: No clubbing or cyanosis.  HEENT: Normal.   Assessment/Plan: 1. RV failure, pulmonary hypertension: Echo in 5/21 showed EF 55-60%, D-shaped septum with severe RV dilation/severe RV dysfunction.  [place] in 7/21 showed mildly elevated RA pressure, moderate PAH with PVR 5 WU.  V/Q scan showed perfusion defects, but CTA chest in 7/21 showed no acute or chronic PE, V/Q findings were likely due to chronic sarcoidosis changes in lungs.  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  Echo in 5/22 showed mildly improved RV function compared to 5/21. Echo in 5/23 showed moderate RV dysfunction and moderate RV  enlargement,  PASP 57 mmHg.  RHC in 7/23 showed moderate pulmonary arterial hypertension, mildly lower than prior study.  He did not tolerate Tyvaso. He is now off Opsumit (could not tolerate due to swelling).  NYHA class III chronically.  Not volume overloaded on exam but 6 minute walk worse.  ?Worsening of lung disease, recent CT chest showed chronic bilateral scarring/fibrosis from sarcoidosis.  - Continue torsemide 80 qam/40 qpm.  BMET/BNP today.  - Could not tolerate Opsumit, will try him on ambrisentan 5 mg daily.   - Continue Adempas 2.5 mg tid.   - He is on selexipag 1200 mcg bid, unable to tolerate higher dose.  - Continue CPAP and home oxygen.  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  Has positive lupus anticoagulant.  - Continue warfarin, should have Lovenox bridge if stops warfarin (had PE when off warfarin transiently in 2/21).  3. Sarcoidosis: History of "burned out" pulmonary sarcoidosis.  This may be the cause of his pulmonary hypertension.  No definitive evidence for cardiac sarcoidosis, but could arrange for cardiac MRI in the future to investigate. He is no longer on prednisone.  - Continue 3-4 L home oxygen.  - Followup with pulmonary.  4. HTN: BP controlled.  5. OSA: Use CPAP.   Followup 3 months     Loralie Champagne 02/20/2023

## 2023-02-21 ENCOUNTER — Telehealth (HOSPITAL_COMMUNITY): Payer: Self-pay | Admitting: Pharmacy Technician

## 2023-02-21 NOTE — Telephone Encounter (Signed)
Patient Advocate Encounter  [employer] is requesting additional information for prior auth.  Completed and faxed on 2/29

## 2023-02-21 NOTE — Telephone Encounter (Signed)
Patient Advocate Encounter   Received notification from [employer] part D that prior authorization for Ambrisentan is required.   PA submitted on CoverMyMeds Key [id number]  Status is pending   Will continue to follow.

## 2023-02-22 ENCOUNTER — Other Ambulatory Visit (HOSPITAL_COMMUNITY): Payer: Self-pay

## 2023-02-22 NOTE — Telephone Encounter (Signed)
Advanced Heart Failure Patient Advocate Encounter  Prior Authorization for Ambrisentan has been approved.    PA# [id number] Effective dates: 02/21/23 through 12/24/23  Patients co-pay is $0 (90 days)  Will set patient up with [facility] specialty pharmacy for medication delivery.

## 2023-02-22 NOTE — Telephone Encounter (Signed)
Please place order for ABG. Thanks

## 2023-02-25 ENCOUNTER — Ambulatory Visit (INDEPENDENT_AMBULATORY_CARE_PROVIDER_SITE_OTHER): Payer: Medicare Other | Admitting: Pharmacist

## 2023-02-25 ENCOUNTER — Other Ambulatory Visit (HOSPITAL_COMMUNITY): Payer: Self-pay

## 2023-02-25 ENCOUNTER — Telehealth: Payer: Self-pay | Admitting: Nurse Practitioner

## 2023-02-25 DIAGNOSIS — I2699 Other pulmonary embolism without acute cor pulmonale: Secondary | ICD-10-CM | POA: Diagnosis not present

## 2023-02-25 DIAGNOSIS — Z7901 Long term (current) use of anticoagulants: Secondary | ICD-10-CM | POA: Diagnosis present

## 2023-02-25 DIAGNOSIS — Z86718 Personal history of other venous thrombosis and embolism: Secondary | ICD-10-CM

## 2023-02-25 DIAGNOSIS — Z86711 Personal history of pulmonary embolism: Secondary | ICD-10-CM

## 2023-02-25 LAB — POCT INR: INR: 2.4 (ref 2.0–3.0)

## 2023-02-25 NOTE — Telephone Encounter (Signed)
Melissa from [facility] calling to verify a order for a pt

## 2023-02-25 NOTE — Progress Notes (Signed)
Anticoagulation Management Jesus Russell is a 72 y.o. male who reports to the clinic for monitoring of warfarin treatment.    Indication: Long term (current) use of anticoagulants  History of DVT (deep vein thrombosis)  Pulmonary embolism on long-term anticoagulation therapy ([place])  History of pulmonary embolism   Duration: indefinite Supervising physician: Joni Reining  Anticoagulation Clinic Visit History: Patient does not report signs/symptoms of bleeding or thromboembolism  Other recent changes: No diet, medications, lifestyle changes cited by the patient at this visit to me.  Anticoagulation Episode Summary     Current INR goal:  2.0-3.0  TTR:  77.4 % (3.3 y)  Next INR check:  04/22/2023  INR from last check:  2.4 (02/25/2023)  Weekly max warfarin dose:    Target end date:  Indefinite  INR check location:    Preferred lab:    Send INR reminders to:     Indications   Long term (current) use of anticoagulants [Z79.01] History of DVT (deep vein thrombosis) [Z86.718] Pulmonary embolism on long-term anticoagulation therapy (HCC) [I26.99 Z79.01] History of pulmonary embolism [Z86.711]        Comments:           No Known Allergies  Current Outpatient Medications:    albuterol (VENTOLIN HFA) 108 (90 Base) MCG/ACT inhaler, Inhale 2 puffs into the lungs every 6 (six) hours as needed for wheezing or shortness of breath., Disp: 18 g, Rfl: 5   alendronate (FOSAMAX) 70 MG tablet, Take 1 tablet (70 mg total) by mouth once a week., Disp: 4 tablet, Rfl: 5   Ascorbic Acid (VITAMIN C) 500 MG CAPS, Take 500 mg by mouth daily., Disp: , Rfl:    cholecalciferol (VITAMIN D3) 25 MCG (1000 UT) tablet, Take 1,000 Units by mouth daily., Disp: , Rfl:    Fluticasone-Umeclidin-Vilant (TRELEGY ELLIPTA) 200-62.5-25 MCG/ACT AEPB, Inhale 1 puff into the lungs daily., Disp: 60 each, Rfl: 5   KLOR-CON 20 MEQ packet, DISSOLVE 3 PACKETS IN LIQUID AND TAKE BY MOUTH DAILY AS DIRECTED, Disp: 90 each, Rfl:  2   omega-3 acid ethyl esters (LOVAZA) 1 g capsule, TAKE 1 CAPSULES BY MOUTH TWICE DAILY, Disp: 180 capsule, Rfl: 1   oxymetazoline (AFRIN) 0.05 % nasal spray, Place 1 spray into both nostrils daily as needed for congestion., Disp: , Rfl:    Polyethyl Glycol-Propyl Glycol (SYSTANE OP), Place 1-2 drops into both eyes as needed (Dry eyes)., Disp: , Rfl:    Riociguat (ADEMPAS) 2.5 MG TABS, Take 2.5 mg by mouth in the morning, at noon, and at bedtime. (Patient taking differently: Take 2.5 mg by mouth 2 (two) times daily.), Disp: 270 tablet, Rfl: 3   Selexipag 1200 MCG TABS, Take 1,200 mcg by mouth 2 (two) times daily. daily, Disp: , Rfl:    torsemide (DEMADEX) 20 MG tablet, TAKE 4 TABLETS BY MOUTH EVERY MORNING AND 2 TABLETS EVERY EVENING, Disp: 540 tablet, Rfl: 3   warfarin (COUMADIN) 4 MG tablet, Take one-and-one-half (1&1/2) tablets all days of the week--EXCEPT on Tuesdays, Thursdays and Saturdays, take only one (1) tablet on these days. Patient requests 90 day supply., Disp: 108 tablet, Rfl: 1   zinc gluconate 50 MG tablet, Take 50 mg by mouth daily., Disp: , Rfl:    acetaminophen (TYLENOL) 500 MG tablet, Take 1,000-1,500 mg by mouth every 6 (six) hours as needed for mild pain or moderate pain. 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    Social History   Socioeconomic History   Marital status: Divorced    Spouse name: Not on file   Number of children: Not on file   Years of education: Not on file   Highest education level: Not on file  Occupational History   Occupation: retired Pharmacist, hospital  Tobacco Use   Smoking status: Former    Packs/day: 1.50    Years: 23.00    Total pack years: 34.50    Types: Cigarettes    Quit date: 1995    Years since quitting: 29.1   Smokeless tobacco: Never  Vaping Use   Vaping Use: Never used  Substance and Sexual Activity   Alcohol use: Never   Drug use: Not Currently    Types: "Crack" cocaine    Comment: quit 2008   Sexual activity: Not on file  Other Topics Concern   Not on file  Social History Narrative   Not on file   Social Determinants of Health   Financial Resource Strain: Low Risk  (01/08/2023)   Overall Financial Resource Strain (CARDIA)    Difficulty of Paying Living Expenses: Not very hard  Food Insecurity: No Food Insecurity (01/08/2023)   Hunger Vital Sign    Worried About Running Out of Food in the Last Year: Never true    [place] in the Last Year: Never true  Transportation Needs: No Transportation Needs (01/08/2023)   PRAPARE - Hydrologist (Medical): No    Lack of Transportation (Non-Medical): No  Physical Activity: Inactive (01/08/2023)   Exercise Vital Sign    Days of Exercise per Week: 0 days    Minutes of Exercise per Session: 0 min  Stress: No Stress Concern Present (01/08/2023)   [place]    Feeling of Stress : Not at all  Social Connections: Moderately Isolated (01/08/2023)   Social Connection and Isolation Panel [NHANES]    Frequency of Communication with Friends and Family: More than three times a week    Frequency of Social Gatherings with Friends and Family: More than three times a week    Attends  Religious Services: Never    Marine scientist or Organizations: Yes    Attends Music therapist: More than 4 times per year    Marital Status: Divorced   Family History  Adopted: Yes    ASSESSMENT Recent Results: The most recent result is correlated with 34 mg per week: Lab Results  Component Value Date   INR 2.4 02/25/2023   INR 2.6 12/31/2022   INR 2.8 10/29/2022    Anticoagulation Dosing: Description   Take one-and-one half (1 & 1/2) tablets on Mondays, Wednesdays and Fridays. Take only one (1) tablet  on all other days.      INR today: Therapeutic  PLAN Weekly dose was unchanged.    Patient Instructions  Patient instructed to take medications as defined in the Anti-coagulation Track section of this encounter.  Patient instructed to take today's dose.  Patient instructed to take one-and-one half (1 & 1/2) tablets on Mondays, Wednesdays and Fridays. Take only one (1) tablet on all other days.  Patient verbalized understanding of these instructions.  Patient advised to contact clinic or seek medical attention if signs/symptoms of bleeding or thromboembolism occur.  Patient verbalized understanding by repeating back information and was advised to contact me if further medication-related questions arise. Patient was also provided an information handout.  Follow-up Return in 8 weeks (on 04/22/2023) for Follow up INR.  Pennie Banter, PharmD, CPP  15 minutes spent face-to-face with the patient during the encounter. 50% of time spent on education, including signs/sx bleeding and clotting, as well as food and drug interactions with warfarin. 50% of time was spent on fingerprick POC INR sample collection,processing, results determination, and documentation in http://www.kim.net/.

## 2023-02-25 NOTE — Patient Instructions (Signed)
Patient instructed to take medications as defined in the Anti-coagulation Track section of this encounter.  Patient instructed to take today's dose.  Patient instructed to take one-and-one half (1 & 1/2) tablets on Mondays, Wednesdays and Fridays. Take only one (1) tablet on all other days.  Patient verbalized understanding of these instructions.  

## 2023-02-26 ENCOUNTER — Other Ambulatory Visit (HOSPITAL_COMMUNITY): Payer: Self-pay | Admitting: Pharmacist

## 2023-02-26 ENCOUNTER — Other Ambulatory Visit (HOSPITAL_COMMUNITY): Payer: Self-pay

## 2023-02-26 ENCOUNTER — Other Ambulatory Visit: Payer: Self-pay

## 2023-02-26 MED ORDER — AMBRISENTAN 5 MG PO TABS
5.0000 mg | ORAL_TABLET | Freq: Every day | ORAL | 3 refills | Status: AC
  Filled 2023-02-26 – 2023-02-28 (×2): qty 90, 90d supply, fill #0
  Filled 2023-05-17: qty 90, 90d supply, fill #1

## 2023-02-26 NOTE — Telephone Encounter (Signed)
No, he does not. Per Ashly at [employer] (who is his DME company), "Hello,   I am sorry about the confusion and the fact that [facility] has set upon having Jesus Russell have all of these test.  But the fact is that he is a [facility] pt and I was the one whom dropped off and has emailed the NIV order for Jesus Russell.  Based on His MCR/ supplemental Ins.  He does not require a PFT nor an ABG, (unless the Proivder feels it necessary), to get the NIV through [facility], his Oxygen Provider.   We just needed a signed order and the Non-invasve ventilator note in the OV encounter.    Thank You and again I am sorry for the added confusion."   Paperwork was signed for [facility] this morning. Nothing further needed from [facility]. Thanks.

## 2023-02-26 NOTE — Telephone Encounter (Signed)
Called and spoke with [place]. Melissa stated that the patient needed to have an ABG done in order to qualify for the NIV. Melissa stated that it did not look like the patient had the ABG done.   [place], please advise.

## 2023-02-26 NOTE — Telephone Encounter (Signed)
Called melissa and left a vm letting her know that the patient is doing the NIV through his DME and nothing was needed from [facility].   Nothing further needed.

## 2023-02-28 ENCOUNTER — Other Ambulatory Visit (HOSPITAL_COMMUNITY): Payer: Self-pay

## 2023-02-28 ENCOUNTER — Other Ambulatory Visit: Payer: Self-pay

## 2023-03-01 ENCOUNTER — Other Ambulatory Visit: Payer: Self-pay

## 2023-03-01 ENCOUNTER — Other Ambulatory Visit (HOSPITAL_COMMUNITY): Payer: Self-pay

## 2023-03-01 NOTE — Telephone Encounter (Signed)
Advanced Heart Failure Patient Advocate Encounter  First 90 day shipment going out today from [facility].  Charlann Boxer, CPhT

## 2023-03-21 ENCOUNTER — Other Ambulatory Visit (HOSPITAL_COMMUNITY): Payer: Self-pay | Admitting: Pharmacist

## 2023-03-21 MED ORDER — UPTRAVI 1000 MCG PO TABS: 1000.0000 ug | ORAL_TABLET | Freq: Two times a day (BID) | ORAL | 11 refills | Status: AC

## 2023-03-26 ENCOUNTER — Other Ambulatory Visit: Payer: Self-pay | Admitting: Pharmacist

## 2023-03-26 MED ORDER — WARFARIN SODIUM 4 MG PO TABS: ORAL_TABLET | ORAL | 1 refills | Status: AC

## 2023-03-26 NOTE — Telephone Encounter (Signed)
Patient called requesting refill on his warfarin--4mg  strength, blue warfarin tablets. Last documentation reflects: 1x4mg  Su/Tu/Th/Sa; 1 & 1/2 x 4mg  (6mg  dose) on M/W/F. Rx for 36 tablets with these instructions has been sent to his pharmacy of record.

## 2023-04-01 ENCOUNTER — Telehealth (HOSPITAL_COMMUNITY): Payer: Self-pay | Admitting: Cardiology

## 2023-04-01 ENCOUNTER — Ambulatory Visit: Payer: Medicare Other | Admitting: Internal Medicine

## 2023-04-01 ENCOUNTER — Telehealth: Payer: Self-pay | Admitting: Internal Medicine

## 2023-04-01 NOTE — Telephone Encounter (Signed)
Patient called to request a call from Dr Shirlee Latch Reports he will be moving back to [place] Would like to speak directly to provider

## 2023-04-01 NOTE — Telephone Encounter (Signed)
I spoke with the patient, moving to [place].

## 2023-04-01 NOTE — Telephone Encounter (Signed)
Patient would like to discuss pulmonary rehab and moving out of state. Patient phone number is [phone].

## 2023-04-03 NOTE — Telephone Encounter (Signed)
Spoke to pt and pt stated he wanted a refill on Trelegy 100 MG to take with him because he is moving back to [place]. I informed him that he has 5 refills on trelegy and once he gets to [place] he can go to any [facility] to pick up the rest of his refills. Pt also wanted the number to medical records. I gave him the number. He verbalized understanding nothing further needed.

## 2023-04-04 ENCOUNTER — Other Ambulatory Visit: Payer: Self-pay

## 2023-04-22 ENCOUNTER — Telehealth (HOSPITAL_COMMUNITY): Payer: Self-pay

## 2023-04-22 ENCOUNTER — Ambulatory Visit: Payer: Medicare Other

## 2023-04-22 NOTE — Telephone Encounter (Signed)
Received a fax requesting medical records from [facility]. Several attempts made to fax but were unsuccessful. Records were successfully mailed to: [address] [place], [place] [address] ,which was the address provided.. Medical request form will be scanned into patients chart.

## 2023-05-03 ENCOUNTER — Other Ambulatory Visit (HOSPITAL_COMMUNITY): Payer: Self-pay

## 2023-05-03 MED ORDER — ADEMPAS 2.5 MG PO TABS: 2.5000 mg | ORAL_TABLET | Freq: Three times a day (TID) | ORAL | 3 refills | Status: AC

## 2023-05-13 ENCOUNTER — Encounter (HOSPITAL_COMMUNITY): Payer: Medicare Other | Admitting: Cardiology

## 2023-05-17 ENCOUNTER — Other Ambulatory Visit (HOSPITAL_COMMUNITY): Payer: Self-pay

## 2023-05-21 ENCOUNTER — Other Ambulatory Visit: Payer: Self-pay

## 2023-05-21 ENCOUNTER — Other Ambulatory Visit (HOSPITAL_COMMUNITY): Payer: Self-pay

## 2023-05-22 ENCOUNTER — Other Ambulatory Visit (HOSPITAL_COMMUNITY): Payer: Self-pay

## 2023-05-22 ENCOUNTER — Other Ambulatory Visit: Payer: Self-pay

## 2023-05-24 ENCOUNTER — Other Ambulatory Visit (HOSPITAL_COMMUNITY): Payer: Self-pay

## 2023-05-28 ENCOUNTER — Other Ambulatory Visit (HOSPITAL_COMMUNITY): Payer: Self-pay

## 2023-05-28 ENCOUNTER — Encounter: Payer: Self-pay | Admitting: *Deleted

## 2023-08-12 ENCOUNTER — Other Ambulatory Visit: Payer: Self-pay

## 2023-08-23 ENCOUNTER — Other Ambulatory Visit: Payer: Self-pay

## 2023-09-14 ENCOUNTER — Encounter (HOSPITAL_COMMUNITY): Payer: Self-pay

## 2023-11-08 ENCOUNTER — Other Ambulatory Visit (HOSPITAL_COMMUNITY): Payer: Self-pay

## 2023-11-08 ENCOUNTER — Other Ambulatory Visit: Payer: Self-pay

## 2023-11-28 ENCOUNTER — Telehealth (HOSPITAL_COMMUNITY): Payer: Self-pay | Admitting: Pharmacist

## 2023-11-28 ENCOUNTER — Other Ambulatory Visit (HOSPITAL_COMMUNITY): Payer: Self-pay

## 2023-11-28 NOTE — Telephone Encounter (Signed)
Patient Advocate Encounter   Received notification from [employer] that prior authorizations for ambrisentan, Adempas, Cordie Grice are required.   PA submitted on CoverMyMeds Key [id number], [id number] Status is pending   Will continue to follow.   Karle Plumber, PharmD, BCPS, BCCP, CPP Heart Failure Clinic Pharmacist [phone]

## 2023-11-28 NOTE — Telephone Encounter (Signed)
Advanced Heart Failure Patient Advocate Encounter  Prior Authorizations for ambrisentan, Adempas, Cordie Grice are  approved.     Effective dates: 12/25/23 through 12/23/24  Karle Plumber, PharmD, BCPS, BCCP, CPP Heart Failure Clinic Pharmacist [phone]

## 2024-02-27 ENCOUNTER — Encounter: Payer: Self-pay | Admitting: Internal Medicine
